# Patient Record
Sex: Male | Born: 1940 | Race: White | Hispanic: No | Smoking: Former smoker
Health system: Southern US, Community
[De-identification: ages and names within clinical notes are randomized; demographics above are authoritative.]

## PROBLEM LIST (undated history)

## (undated) DIAGNOSIS — D649 Anemia, unspecified: Secondary | ICD-10-CM

## (undated) DIAGNOSIS — K2970 Gastritis, unspecified, without bleeding: Principal | ICD-10-CM

## (undated) DIAGNOSIS — Z880 Allergy status to penicillin: Secondary | ICD-10-CM

## (undated) DIAGNOSIS — J189 Pneumonia, unspecified organism: Secondary | ICD-10-CM

## (undated) DIAGNOSIS — I2510 Atherosclerotic heart disease of native coronary artery without angina pectoris: Secondary | ICD-10-CM

## (undated) DIAGNOSIS — F419 Anxiety disorder, unspecified: Secondary | ICD-10-CM

## (undated) DIAGNOSIS — B9681 Helicobacter pylori [H. pylori] as the cause of diseases classified elsewhere: Secondary | ICD-10-CM

## (undated) DIAGNOSIS — C4490 Unspecified malignant neoplasm of skin, unspecified: Secondary | ICD-10-CM

## (undated) DIAGNOSIS — I499 Cardiac arrhythmia, unspecified: Secondary | ICD-10-CM

## (undated) DIAGNOSIS — J449 Chronic obstructive pulmonary disease, unspecified: Secondary | ICD-10-CM

## (undated) HISTORY — DX: Helicobacter pylori (H. pylori) as the cause of diseases classified elsewhere: B96.81

## (undated) HISTORY — PX: CHOLECYSTECTOMY: SHX55

## (undated) HISTORY — DX: Anemia, unspecified: D64.9

## (undated) HISTORY — DX: Cardiac arrhythmia, unspecified: I49.9

## (undated) HISTORY — PX: OTHER SURGICAL HISTORY: SHX169

## (undated) HISTORY — DX: Anxiety disorder, unspecified: F41.9

## (undated) HISTORY — DX: Allergy status to penicillin: Z88.0

## (undated) HISTORY — DX: Atherosclerotic heart disease of native coronary artery without angina pectoris: I25.10

## (undated) HISTORY — DX: Chronic obstructive pulmonary disease, unspecified: J44.9

## (undated) HISTORY — DX: Gastritis, unspecified, without bleeding: K29.70

## (undated) HISTORY — PX: APPENDECTOMY: SHX54

## (undated) HISTORY — DX: Unspecified malignant neoplasm of skin, unspecified: C44.90

## (undated) HISTORY — DX: Pneumonia, unspecified organism: J18.9

---

## 1973-10-26 HISTORY — PX: VARICOSE VEIN SURGERY: SHX832

## 1982-10-26 HISTORY — PX: SPINE SURGERY: SHX786

## 2004-08-13 ENCOUNTER — Ambulatory Visit: Payer: Self-pay | Admitting: Internal Medicine

## 2008-03-16 ENCOUNTER — Emergency Department (HOSPITAL_COMMUNITY): Admission: EM | Admit: 2008-03-16 | Discharge: 2008-03-16 | Payer: Self-pay | Admitting: Emergency Medicine

## 2008-05-23 ENCOUNTER — Ambulatory Visit: Payer: Self-pay | Admitting: Unknown Physician Specialty

## 2008-08-15 ENCOUNTER — Ambulatory Visit: Payer: Self-pay | Admitting: Specialist

## 2009-09-17 ENCOUNTER — Ambulatory Visit: Payer: Self-pay | Admitting: Internal Medicine

## 2010-04-06 ENCOUNTER — Emergency Department: Payer: Self-pay | Admitting: Emergency Medicine

## 2011-07-14 ENCOUNTER — Ambulatory Visit: Payer: Self-pay | Admitting: Gastroenterology

## 2011-07-16 LAB — PATHOLOGY REPORT

## 2011-07-20 ENCOUNTER — Emergency Department: Payer: Self-pay | Admitting: Emergency Medicine

## 2011-07-22 LAB — CBC
HCT: 48.3
Hemoglobin: 16.7
MCHC: 34.6
MCV: 90.4
Platelets: 465 — ABNORMAL HIGH
RBC: 5.34
RDW: 16 — ABNORMAL HIGH
WBC: 21.4 — ABNORMAL HIGH

## 2011-07-22 LAB — POCT CARDIAC MARKERS
CKMB, poc: 1.2
CKMB, poc: 1.6
Myoglobin, poc: 72.5
Myoglobin, poc: 95.6
Operator id: 282201
Operator id: 282201
Troponin i, poc: <0.05
Troponin i, poc: <0.05

## 2011-07-22 LAB — DIFFERENTIAL
Basophils Absolute: 0.2 — ABNORMAL HIGH
Basophils Relative: 1
Eosinophils Absolute: 0.3
Eosinophils Relative: 2
Lymphocytes Relative: 14
Lymphs Abs: 3
Monocytes Absolute: 1
Monocytes Relative: 5
Neutro Abs: 16.9 — ABNORMAL HIGH
Neutrophils Relative %: 79 — ABNORMAL HIGH

## 2011-07-22 LAB — POCT I-STAT, CHEM 8
BUN: 14
Calcium, Ion: 1.13
Chloride: 103
Creatinine, Ser: 0.9
Glucose, Bld: 94
HCT: 51
Hemoglobin: 17.3 — ABNORMAL HIGH
Potassium: 3.9
Sodium: 139
TCO2: 29

## 2011-07-22 LAB — D-DIMER, QUANTITATIVE (NOT AT ARMC): D-Dimer, Quant: 1.45 — ABNORMAL HIGH

## 2012-10-26 HISTORY — PX: COLONOSCOPY: SHX174

## 2014-02-15 ENCOUNTER — Ambulatory Visit: Payer: Self-pay | Admitting: Internal Medicine

## 2014-02-28 DIAGNOSIS — R911 Solitary pulmonary nodule: Secondary | ICD-10-CM

## 2014-02-28 DIAGNOSIS — K2100 Gastro-esophageal reflux disease with esophagitis, without bleeding: Secondary | ICD-10-CM | POA: Insufficient documentation

## 2014-02-28 HISTORY — DX: Gastro-esophageal reflux disease with esophagitis, without bleeding: K21.00

## 2014-02-28 HISTORY — DX: Solitary pulmonary nodule: R91.1

## 2014-11-08 DIAGNOSIS — R0602 Shortness of breath: Secondary | ICD-10-CM | POA: Diagnosis not present

## 2014-11-12 DIAGNOSIS — E782 Mixed hyperlipidemia: Secondary | ICD-10-CM | POA: Diagnosis not present

## 2014-11-12 DIAGNOSIS — I209 Angina pectoris, unspecified: Secondary | ICD-10-CM | POA: Diagnosis not present

## 2014-11-12 DIAGNOSIS — G4733 Obstructive sleep apnea (adult) (pediatric): Secondary | ICD-10-CM | POA: Diagnosis not present

## 2014-11-12 DIAGNOSIS — J431 Panlobular emphysema: Secondary | ICD-10-CM | POA: Diagnosis not present

## 2014-12-13 DIAGNOSIS — I208 Other forms of angina pectoris: Secondary | ICD-10-CM | POA: Diagnosis not present

## 2014-12-13 DIAGNOSIS — I2510 Atherosclerotic heart disease of native coronary artery without angina pectoris: Secondary | ICD-10-CM | POA: Diagnosis not present

## 2014-12-13 DIAGNOSIS — R072 Precordial pain: Secondary | ICD-10-CM | POA: Diagnosis not present

## 2014-12-18 DIAGNOSIS — Z79899 Other long term (current) drug therapy: Secondary | ICD-10-CM | POA: Diagnosis not present

## 2014-12-18 DIAGNOSIS — J449 Chronic obstructive pulmonary disease, unspecified: Secondary | ICD-10-CM | POA: Diagnosis not present

## 2014-12-18 DIAGNOSIS — R0789 Other chest pain: Secondary | ICD-10-CM | POA: Diagnosis not present

## 2014-12-18 DIAGNOSIS — E780 Pure hypercholesterolemia: Secondary | ICD-10-CM | POA: Diagnosis not present

## 2014-12-18 DIAGNOSIS — R9439 Abnormal result of other cardiovascular function study: Secondary | ICD-10-CM | POA: Diagnosis not present

## 2014-12-18 DIAGNOSIS — E785 Hyperlipidemia, unspecified: Secondary | ICD-10-CM | POA: Diagnosis not present

## 2014-12-18 DIAGNOSIS — I25119 Atherosclerotic heart disease of native coronary artery with unspecified angina pectoris: Secondary | ICD-10-CM | POA: Diagnosis not present

## 2014-12-18 DIAGNOSIS — J984 Other disorders of lung: Secondary | ICD-10-CM | POA: Diagnosis not present

## 2014-12-18 DIAGNOSIS — I252 Old myocardial infarction: Secondary | ICD-10-CM | POA: Diagnosis not present

## 2014-12-18 DIAGNOSIS — I471 Supraventricular tachycardia: Secondary | ICD-10-CM | POA: Diagnosis not present

## 2014-12-19 DIAGNOSIS — I471 Supraventricular tachycardia: Secondary | ICD-10-CM | POA: Diagnosis not present

## 2014-12-19 DIAGNOSIS — R0789 Other chest pain: Secondary | ICD-10-CM | POA: Diagnosis not present

## 2014-12-19 DIAGNOSIS — I2510 Atherosclerotic heart disease of native coronary artery without angina pectoris: Secondary | ICD-10-CM | POA: Diagnosis not present

## 2014-12-19 DIAGNOSIS — I25119 Atherosclerotic heart disease of native coronary artery with unspecified angina pectoris: Secondary | ICD-10-CM | POA: Diagnosis not present

## 2014-12-19 DIAGNOSIS — E780 Pure hypercholesterolemia: Secondary | ICD-10-CM | POA: Diagnosis not present

## 2014-12-19 DIAGNOSIS — Z79899 Other long term (current) drug therapy: Secondary | ICD-10-CM | POA: Diagnosis not present

## 2014-12-19 DIAGNOSIS — R9439 Abnormal result of other cardiovascular function study: Secondary | ICD-10-CM | POA: Diagnosis not present

## 2014-12-19 DIAGNOSIS — I252 Old myocardial infarction: Secondary | ICD-10-CM | POA: Diagnosis not present

## 2014-12-19 DIAGNOSIS — J449 Chronic obstructive pulmonary disease, unspecified: Secondary | ICD-10-CM | POA: Diagnosis not present

## 2014-12-19 DIAGNOSIS — E785 Hyperlipidemia, unspecified: Secondary | ICD-10-CM | POA: Diagnosis not present

## 2015-01-10 DIAGNOSIS — Z955 Presence of coronary angioplasty implant and graft: Secondary | ICD-10-CM | POA: Diagnosis not present

## 2015-01-10 DIAGNOSIS — E785 Hyperlipidemia, unspecified: Secondary | ICD-10-CM | POA: Diagnosis not present

## 2015-01-10 DIAGNOSIS — I2510 Atherosclerotic heart disease of native coronary artery without angina pectoris: Secondary | ICD-10-CM | POA: Diagnosis not present

## 2015-01-28 DIAGNOSIS — H269 Unspecified cataract: Secondary | ICD-10-CM | POA: Diagnosis not present

## 2015-01-28 DIAGNOSIS — Z961 Presence of intraocular lens: Secondary | ICD-10-CM | POA: Diagnosis not present

## 2015-01-28 DIAGNOSIS — D3131 Benign neoplasm of right choroid: Secondary | ICD-10-CM | POA: Diagnosis not present

## 2015-01-28 DIAGNOSIS — H3531 Nonexudative age-related macular degeneration: Secondary | ICD-10-CM | POA: Diagnosis not present

## 2015-04-23 DIAGNOSIS — D72823 Leukemoid reaction: Secondary | ICD-10-CM | POA: Diagnosis not present

## 2015-04-23 DIAGNOSIS — E785 Hyperlipidemia, unspecified: Secondary | ICD-10-CM | POA: Diagnosis not present

## 2015-04-23 DIAGNOSIS — D519 Vitamin B12 deficiency anemia, unspecified: Secondary | ICD-10-CM | POA: Diagnosis not present

## 2015-04-23 DIAGNOSIS — I209 Angina pectoris, unspecified: Secondary | ICD-10-CM | POA: Diagnosis not present

## 2015-04-23 DIAGNOSIS — Z125 Encounter for screening for malignant neoplasm of prostate: Secondary | ICD-10-CM | POA: Diagnosis not present

## 2015-05-01 DIAGNOSIS — J431 Panlobular emphysema: Secondary | ICD-10-CM | POA: Diagnosis not present

## 2015-05-01 DIAGNOSIS — R319 Hematuria, unspecified: Secondary | ICD-10-CM | POA: Diagnosis not present

## 2015-05-01 DIAGNOSIS — Z0000 Encounter for general adult medical examination without abnormal findings: Secondary | ICD-10-CM | POA: Diagnosis not present

## 2015-05-01 DIAGNOSIS — E538 Deficiency of other specified B group vitamins: Secondary | ICD-10-CM | POA: Diagnosis not present

## 2015-05-08 DIAGNOSIS — R310 Gross hematuria: Secondary | ICD-10-CM | POA: Diagnosis not present

## 2015-05-08 DIAGNOSIS — R351 Nocturia: Secondary | ICD-10-CM | POA: Diagnosis not present

## 2015-06-13 DIAGNOSIS — S60561A Insect bite (nonvenomous) of right hand, initial encounter: Secondary | ICD-10-CM | POA: Diagnosis not present

## 2015-06-13 DIAGNOSIS — J449 Chronic obstructive pulmonary disease, unspecified: Secondary | ICD-10-CM | POA: Diagnosis not present

## 2015-06-13 DIAGNOSIS — E785 Hyperlipidemia, unspecified: Secondary | ICD-10-CM | POA: Diagnosis not present

## 2015-06-13 DIAGNOSIS — S61459A Open bite of unspecified hand, initial encounter: Secondary | ICD-10-CM | POA: Diagnosis not present

## 2015-06-13 DIAGNOSIS — I252 Old myocardial infarction: Secondary | ICD-10-CM | POA: Diagnosis not present

## 2015-06-13 DIAGNOSIS — Z23 Encounter for immunization: Secondary | ICD-10-CM | POA: Diagnosis not present

## 2015-06-13 DIAGNOSIS — Z79899 Other long term (current) drug therapy: Secondary | ICD-10-CM | POA: Diagnosis not present

## 2015-06-13 DIAGNOSIS — Z87891 Personal history of nicotine dependence: Secondary | ICD-10-CM | POA: Diagnosis not present

## 2015-06-13 DIAGNOSIS — Z7982 Long term (current) use of aspirin: Secondary | ICD-10-CM | POA: Diagnosis not present

## 2015-06-13 DIAGNOSIS — I10 Essential (primary) hypertension: Secondary | ICD-10-CM | POA: Diagnosis not present

## 2015-06-14 DIAGNOSIS — Z87891 Personal history of nicotine dependence: Secondary | ICD-10-CM | POA: Diagnosis not present

## 2015-06-14 DIAGNOSIS — Z7982 Long term (current) use of aspirin: Secondary | ICD-10-CM | POA: Diagnosis not present

## 2015-06-14 DIAGNOSIS — I10 Essential (primary) hypertension: Secondary | ICD-10-CM | POA: Diagnosis not present

## 2015-06-14 DIAGNOSIS — Z23 Encounter for immunization: Secondary | ICD-10-CM | POA: Diagnosis not present

## 2015-06-14 DIAGNOSIS — Z79899 Other long term (current) drug therapy: Secondary | ICD-10-CM | POA: Diagnosis not present

## 2015-06-14 DIAGNOSIS — J449 Chronic obstructive pulmonary disease, unspecified: Secondary | ICD-10-CM | POA: Diagnosis not present

## 2015-06-14 DIAGNOSIS — E785 Hyperlipidemia, unspecified: Secondary | ICD-10-CM | POA: Diagnosis not present

## 2015-06-14 DIAGNOSIS — I252 Old myocardial infarction: Secondary | ICD-10-CM | POA: Diagnosis not present

## 2015-06-14 DIAGNOSIS — S61459A Open bite of unspecified hand, initial encounter: Secondary | ICD-10-CM | POA: Diagnosis not present

## 2015-07-08 DIAGNOSIS — D1801 Hemangioma of skin and subcutaneous tissue: Secondary | ICD-10-CM | POA: Diagnosis not present

## 2015-07-08 DIAGNOSIS — L821 Other seborrheic keratosis: Secondary | ICD-10-CM | POA: Diagnosis not present

## 2015-07-08 DIAGNOSIS — B078 Other viral warts: Secondary | ICD-10-CM | POA: Diagnosis not present

## 2015-07-08 DIAGNOSIS — D2371 Other benign neoplasm of skin of right lower limb, including hip: Secondary | ICD-10-CM | POA: Diagnosis not present

## 2015-07-08 DIAGNOSIS — L570 Actinic keratosis: Secondary | ICD-10-CM | POA: Diagnosis not present

## 2015-07-08 DIAGNOSIS — Z85820 Personal history of malignant melanoma of skin: Secondary | ICD-10-CM | POA: Diagnosis not present

## 2015-10-08 DIAGNOSIS — D72823 Leukemoid reaction: Secondary | ICD-10-CM | POA: Diagnosis not present

## 2015-10-08 DIAGNOSIS — M25511 Pain in right shoulder: Secondary | ICD-10-CM | POA: Diagnosis not present

## 2015-10-08 DIAGNOSIS — E538 Deficiency of other specified B group vitamins: Secondary | ICD-10-CM | POA: Diagnosis not present

## 2015-10-18 ENCOUNTER — Other Ambulatory Visit: Payer: Self-pay | Admitting: Physician Assistant

## 2015-10-18 DIAGNOSIS — J431 Panlobular emphysema: Secondary | ICD-10-CM | POA: Diagnosis not present

## 2015-10-18 DIAGNOSIS — J208 Acute bronchitis due to other specified organisms: Secondary | ICD-10-CM | POA: Diagnosis not present

## 2015-10-18 DIAGNOSIS — M25511 Pain in right shoulder: Secondary | ICD-10-CM | POA: Diagnosis not present

## 2015-11-04 DIAGNOSIS — M25511 Pain in right shoulder: Secondary | ICD-10-CM | POA: Diagnosis not present

## 2015-11-05 DIAGNOSIS — H6692 Otitis media, unspecified, left ear: Secondary | ICD-10-CM | POA: Diagnosis not present

## 2015-11-05 DIAGNOSIS — J449 Chronic obstructive pulmonary disease, unspecified: Secondary | ICD-10-CM | POA: Diagnosis not present

## 2015-11-05 DIAGNOSIS — J209 Acute bronchitis, unspecified: Secondary | ICD-10-CM | POA: Diagnosis not present

## 2015-11-05 DIAGNOSIS — J029 Acute pharyngitis, unspecified: Secondary | ICD-10-CM | POA: Diagnosis not present

## 2015-11-12 ENCOUNTER — Ambulatory Visit: Payer: Self-pay

## 2015-11-12 DIAGNOSIS — M25511 Pain in right shoulder: Secondary | ICD-10-CM | POA: Diagnosis not present

## 2015-11-13 DIAGNOSIS — M25511 Pain in right shoulder: Secondary | ICD-10-CM | POA: Diagnosis not present

## 2015-11-18 DIAGNOSIS — M6281 Muscle weakness (generalized): Secondary | ICD-10-CM | POA: Diagnosis not present

## 2015-11-18 DIAGNOSIS — M25611 Stiffness of right shoulder, not elsewhere classified: Secondary | ICD-10-CM | POA: Diagnosis not present

## 2015-11-18 DIAGNOSIS — M25511 Pain in right shoulder: Secondary | ICD-10-CM | POA: Diagnosis not present

## 2015-11-18 DIAGNOSIS — M7501 Adhesive capsulitis of right shoulder: Secondary | ICD-10-CM | POA: Diagnosis not present

## 2015-11-20 DIAGNOSIS — M25611 Stiffness of right shoulder, not elsewhere classified: Secondary | ICD-10-CM | POA: Diagnosis not present

## 2015-11-20 DIAGNOSIS — M6281 Muscle weakness (generalized): Secondary | ICD-10-CM | POA: Diagnosis not present

## 2015-11-20 DIAGNOSIS — M25511 Pain in right shoulder: Secondary | ICD-10-CM | POA: Diagnosis not present

## 2015-11-20 DIAGNOSIS — M7501 Adhesive capsulitis of right shoulder: Secondary | ICD-10-CM | POA: Diagnosis not present

## 2015-11-25 DIAGNOSIS — M6281 Muscle weakness (generalized): Secondary | ICD-10-CM | POA: Diagnosis not present

## 2015-11-25 DIAGNOSIS — M7501 Adhesive capsulitis of right shoulder: Secondary | ICD-10-CM | POA: Diagnosis not present

## 2015-11-25 DIAGNOSIS — M25611 Stiffness of right shoulder, not elsewhere classified: Secondary | ICD-10-CM | POA: Diagnosis not present

## 2015-11-25 DIAGNOSIS — M25511 Pain in right shoulder: Secondary | ICD-10-CM | POA: Diagnosis not present

## 2015-11-28 DIAGNOSIS — M25511 Pain in right shoulder: Secondary | ICD-10-CM | POA: Diagnosis not present

## 2015-11-28 DIAGNOSIS — M7501 Adhesive capsulitis of right shoulder: Secondary | ICD-10-CM | POA: Diagnosis not present

## 2015-11-28 DIAGNOSIS — M6281 Muscle weakness (generalized): Secondary | ICD-10-CM | POA: Diagnosis not present

## 2015-11-28 DIAGNOSIS — M25611 Stiffness of right shoulder, not elsewhere classified: Secondary | ICD-10-CM | POA: Diagnosis not present

## 2015-12-06 DIAGNOSIS — M25511 Pain in right shoulder: Secondary | ICD-10-CM | POA: Diagnosis not present

## 2015-12-06 DIAGNOSIS — M6281 Muscle weakness (generalized): Secondary | ICD-10-CM | POA: Diagnosis not present

## 2015-12-06 DIAGNOSIS — M7501 Adhesive capsulitis of right shoulder: Secondary | ICD-10-CM | POA: Diagnosis not present

## 2015-12-06 DIAGNOSIS — M25611 Stiffness of right shoulder, not elsewhere classified: Secondary | ICD-10-CM | POA: Diagnosis not present

## 2015-12-11 DIAGNOSIS — M25611 Stiffness of right shoulder, not elsewhere classified: Secondary | ICD-10-CM | POA: Diagnosis not present

## 2015-12-11 DIAGNOSIS — M6281 Muscle weakness (generalized): Secondary | ICD-10-CM | POA: Diagnosis not present

## 2015-12-11 DIAGNOSIS — M25511 Pain in right shoulder: Secondary | ICD-10-CM | POA: Diagnosis not present

## 2015-12-11 DIAGNOSIS — M7501 Adhesive capsulitis of right shoulder: Secondary | ICD-10-CM | POA: Diagnosis not present

## 2015-12-13 DIAGNOSIS — M25611 Stiffness of right shoulder, not elsewhere classified: Secondary | ICD-10-CM | POA: Diagnosis not present

## 2015-12-13 DIAGNOSIS — M25511 Pain in right shoulder: Secondary | ICD-10-CM | POA: Diagnosis not present

## 2015-12-13 DIAGNOSIS — M7501 Adhesive capsulitis of right shoulder: Secondary | ICD-10-CM | POA: Diagnosis not present

## 2015-12-13 DIAGNOSIS — M6281 Muscle weakness (generalized): Secondary | ICD-10-CM | POA: Diagnosis not present

## 2015-12-16 DIAGNOSIS — M6281 Muscle weakness (generalized): Secondary | ICD-10-CM | POA: Diagnosis not present

## 2015-12-16 DIAGNOSIS — M25611 Stiffness of right shoulder, not elsewhere classified: Secondary | ICD-10-CM | POA: Diagnosis not present

## 2015-12-16 DIAGNOSIS — M7501 Adhesive capsulitis of right shoulder: Secondary | ICD-10-CM | POA: Diagnosis not present

## 2015-12-16 DIAGNOSIS — M25511 Pain in right shoulder: Secondary | ICD-10-CM | POA: Diagnosis not present

## 2015-12-17 DIAGNOSIS — M25511 Pain in right shoulder: Secondary | ICD-10-CM | POA: Diagnosis not present

## 2015-12-18 DIAGNOSIS — M6281 Muscle weakness (generalized): Secondary | ICD-10-CM | POA: Diagnosis not present

## 2015-12-18 DIAGNOSIS — M25611 Stiffness of right shoulder, not elsewhere classified: Secondary | ICD-10-CM | POA: Diagnosis not present

## 2015-12-18 DIAGNOSIS — M7501 Adhesive capsulitis of right shoulder: Secondary | ICD-10-CM | POA: Diagnosis not present

## 2015-12-18 DIAGNOSIS — M25511 Pain in right shoulder: Secondary | ICD-10-CM | POA: Diagnosis not present

## 2015-12-23 DIAGNOSIS — M25511 Pain in right shoulder: Secondary | ICD-10-CM | POA: Diagnosis not present

## 2015-12-23 DIAGNOSIS — M25611 Stiffness of right shoulder, not elsewhere classified: Secondary | ICD-10-CM | POA: Diagnosis not present

## 2015-12-23 DIAGNOSIS — M6281 Muscle weakness (generalized): Secondary | ICD-10-CM | POA: Diagnosis not present

## 2015-12-23 DIAGNOSIS — M7501 Adhesive capsulitis of right shoulder: Secondary | ICD-10-CM | POA: Diagnosis not present

## 2015-12-26 DIAGNOSIS — M25611 Stiffness of right shoulder, not elsewhere classified: Secondary | ICD-10-CM | POA: Diagnosis not present

## 2015-12-26 DIAGNOSIS — M6281 Muscle weakness (generalized): Secondary | ICD-10-CM | POA: Diagnosis not present

## 2015-12-26 DIAGNOSIS — M7501 Adhesive capsulitis of right shoulder: Secondary | ICD-10-CM | POA: Diagnosis not present

## 2015-12-26 DIAGNOSIS — M25511 Pain in right shoulder: Secondary | ICD-10-CM | POA: Diagnosis not present

## 2015-12-30 DIAGNOSIS — M6281 Muscle weakness (generalized): Secondary | ICD-10-CM | POA: Diagnosis not present

## 2015-12-30 DIAGNOSIS — M25611 Stiffness of right shoulder, not elsewhere classified: Secondary | ICD-10-CM | POA: Diagnosis not present

## 2015-12-30 DIAGNOSIS — M25511 Pain in right shoulder: Secondary | ICD-10-CM | POA: Diagnosis not present

## 2015-12-30 DIAGNOSIS — M7501 Adhesive capsulitis of right shoulder: Secondary | ICD-10-CM | POA: Diagnosis not present

## 2016-01-01 DIAGNOSIS — M25611 Stiffness of right shoulder, not elsewhere classified: Secondary | ICD-10-CM | POA: Diagnosis not present

## 2016-01-01 DIAGNOSIS — M6281 Muscle weakness (generalized): Secondary | ICD-10-CM | POA: Diagnosis not present

## 2016-01-01 DIAGNOSIS — M25511 Pain in right shoulder: Secondary | ICD-10-CM | POA: Diagnosis not present

## 2016-01-01 DIAGNOSIS — M7501 Adhesive capsulitis of right shoulder: Secondary | ICD-10-CM | POA: Diagnosis not present

## 2016-01-06 DIAGNOSIS — M25611 Stiffness of right shoulder, not elsewhere classified: Secondary | ICD-10-CM | POA: Diagnosis not present

## 2016-01-06 DIAGNOSIS — M25511 Pain in right shoulder: Secondary | ICD-10-CM | POA: Diagnosis not present

## 2016-01-06 DIAGNOSIS — M6281 Muscle weakness (generalized): Secondary | ICD-10-CM | POA: Diagnosis not present

## 2016-01-06 DIAGNOSIS — M7501 Adhesive capsulitis of right shoulder: Secondary | ICD-10-CM | POA: Diagnosis not present

## 2016-01-08 DIAGNOSIS — M25611 Stiffness of right shoulder, not elsewhere classified: Secondary | ICD-10-CM | POA: Diagnosis not present

## 2016-01-08 DIAGNOSIS — M25511 Pain in right shoulder: Secondary | ICD-10-CM | POA: Diagnosis not present

## 2016-01-08 DIAGNOSIS — M7501 Adhesive capsulitis of right shoulder: Secondary | ICD-10-CM | POA: Diagnosis not present

## 2016-01-08 DIAGNOSIS — M6281 Muscle weakness (generalized): Secondary | ICD-10-CM | POA: Diagnosis not present

## 2016-01-13 DIAGNOSIS — M7501 Adhesive capsulitis of right shoulder: Secondary | ICD-10-CM | POA: Diagnosis not present

## 2016-01-13 DIAGNOSIS — M25511 Pain in right shoulder: Secondary | ICD-10-CM | POA: Diagnosis not present

## 2016-01-13 DIAGNOSIS — M25611 Stiffness of right shoulder, not elsewhere classified: Secondary | ICD-10-CM | POA: Diagnosis not present

## 2016-01-13 DIAGNOSIS — M6281 Muscle weakness (generalized): Secondary | ICD-10-CM | POA: Diagnosis not present

## 2016-01-16 DIAGNOSIS — M6281 Muscle weakness (generalized): Secondary | ICD-10-CM | POA: Diagnosis not present

## 2016-01-16 DIAGNOSIS — M25611 Stiffness of right shoulder, not elsewhere classified: Secondary | ICD-10-CM | POA: Diagnosis not present

## 2016-01-16 DIAGNOSIS — M7501 Adhesive capsulitis of right shoulder: Secondary | ICD-10-CM | POA: Diagnosis not present

## 2016-01-16 DIAGNOSIS — M25511 Pain in right shoulder: Secondary | ICD-10-CM | POA: Diagnosis not present

## 2016-01-20 DIAGNOSIS — M25611 Stiffness of right shoulder, not elsewhere classified: Secondary | ICD-10-CM | POA: Diagnosis not present

## 2016-01-20 DIAGNOSIS — M6281 Muscle weakness (generalized): Secondary | ICD-10-CM | POA: Diagnosis not present

## 2016-01-20 DIAGNOSIS — M25511 Pain in right shoulder: Secondary | ICD-10-CM | POA: Diagnosis not present

## 2016-01-20 DIAGNOSIS — M7501 Adhesive capsulitis of right shoulder: Secondary | ICD-10-CM | POA: Diagnosis not present

## 2016-01-22 DIAGNOSIS — L03115 Cellulitis of right lower limb: Secondary | ICD-10-CM | POA: Diagnosis not present

## 2016-01-23 DIAGNOSIS — M6281 Muscle weakness (generalized): Secondary | ICD-10-CM | POA: Diagnosis not present

## 2016-01-23 DIAGNOSIS — M25511 Pain in right shoulder: Secondary | ICD-10-CM | POA: Diagnosis not present

## 2016-01-23 DIAGNOSIS — M25611 Stiffness of right shoulder, not elsewhere classified: Secondary | ICD-10-CM | POA: Diagnosis not present

## 2016-01-23 DIAGNOSIS — M7501 Adhesive capsulitis of right shoulder: Secondary | ICD-10-CM | POA: Diagnosis not present

## 2016-01-29 DIAGNOSIS — M542 Cervicalgia: Secondary | ICD-10-CM | POA: Diagnosis not present

## 2016-01-29 DIAGNOSIS — M25511 Pain in right shoulder: Secondary | ICD-10-CM | POA: Diagnosis not present

## 2016-02-04 DIAGNOSIS — H43813 Vitreous degeneration, bilateral: Secondary | ICD-10-CM | POA: Diagnosis not present

## 2016-02-04 DIAGNOSIS — D3131 Benign neoplasm of right choroid: Secondary | ICD-10-CM | POA: Diagnosis not present

## 2016-02-04 DIAGNOSIS — S52122A Displaced fracture of head of left radius, initial encounter for closed fracture: Secondary | ICD-10-CM | POA: Diagnosis not present

## 2016-02-04 DIAGNOSIS — Z7982 Long term (current) use of aspirin: Secondary | ICD-10-CM | POA: Diagnosis not present

## 2016-02-04 DIAGNOSIS — I10 Essential (primary) hypertension: Secondary | ICD-10-CM | POA: Diagnosis not present

## 2016-02-04 DIAGNOSIS — M25522 Pain in left elbow: Secondary | ICD-10-CM | POA: Diagnosis not present

## 2016-02-04 DIAGNOSIS — H353120 Nonexudative age-related macular degeneration, left eye, stage unspecified: Secondary | ICD-10-CM | POA: Diagnosis not present

## 2016-02-04 DIAGNOSIS — I252 Old myocardial infarction: Secondary | ICD-10-CM | POA: Diagnosis not present

## 2016-02-04 DIAGNOSIS — M2540 Effusion, unspecified joint: Secondary | ICD-10-CM | POA: Diagnosis not present

## 2016-02-04 DIAGNOSIS — E7800 Pure hypercholesterolemia, unspecified: Secondary | ICD-10-CM | POA: Diagnosis not present

## 2016-02-04 DIAGNOSIS — Z885 Allergy status to narcotic agent status: Secondary | ICD-10-CM | POA: Diagnosis not present

## 2016-02-04 DIAGNOSIS — H2511 Age-related nuclear cataract, right eye: Secondary | ICD-10-CM | POA: Diagnosis not present

## 2016-02-04 DIAGNOSIS — S5002XA Contusion of left elbow, initial encounter: Secondary | ICD-10-CM | POA: Diagnosis not present

## 2016-02-04 DIAGNOSIS — J449 Chronic obstructive pulmonary disease, unspecified: Secondary | ICD-10-CM | POA: Diagnosis not present

## 2016-02-04 DIAGNOSIS — Z85828 Personal history of other malignant neoplasm of skin: Secondary | ICD-10-CM | POA: Diagnosis not present

## 2016-02-04 DIAGNOSIS — Z886 Allergy status to analgesic agent status: Secondary | ICD-10-CM | POA: Diagnosis not present

## 2016-02-04 DIAGNOSIS — W010XXA Fall on same level from slipping, tripping and stumbling without subsequent striking against object, initial encounter: Secondary | ICD-10-CM | POA: Diagnosis not present

## 2016-02-04 DIAGNOSIS — Z7951 Long term (current) use of inhaled steroids: Secondary | ICD-10-CM | POA: Diagnosis not present

## 2016-02-05 DIAGNOSIS — S52125A Nondisplaced fracture of head of left radius, initial encounter for closed fracture: Secondary | ICD-10-CM | POA: Diagnosis not present

## 2016-03-11 DIAGNOSIS — S52125A Nondisplaced fracture of head of left radius, initial encounter for closed fracture: Secondary | ICD-10-CM | POA: Diagnosis not present

## 2016-04-07 ENCOUNTER — Encounter: Payer: Self-pay | Admitting: Gastroenterology

## 2016-04-10 DIAGNOSIS — Z85828 Personal history of other malignant neoplasm of skin: Secondary | ICD-10-CM | POA: Diagnosis not present

## 2016-04-10 DIAGNOSIS — L570 Actinic keratosis: Secondary | ICD-10-CM | POA: Diagnosis not present

## 2016-04-10 DIAGNOSIS — L821 Other seborrheic keratosis: Secondary | ICD-10-CM | POA: Diagnosis not present

## 2016-04-10 DIAGNOSIS — L0889 Other specified local infections of the skin and subcutaneous tissue: Secondary | ICD-10-CM | POA: Diagnosis not present

## 2016-04-10 DIAGNOSIS — L538 Other specified erythematous conditions: Secondary | ICD-10-CM | POA: Diagnosis not present

## 2016-04-10 DIAGNOSIS — Z08 Encounter for follow-up examination after completed treatment for malignant neoplasm: Secondary | ICD-10-CM | POA: Diagnosis not present

## 2016-04-10 DIAGNOSIS — B078 Other viral warts: Secondary | ICD-10-CM | POA: Diagnosis not present

## 2016-04-10 DIAGNOSIS — X32XXXA Exposure to sunlight, initial encounter: Secondary | ICD-10-CM | POA: Diagnosis not present

## 2016-04-22 DIAGNOSIS — M542 Cervicalgia: Secondary | ICD-10-CM | POA: Diagnosis not present

## 2016-04-22 DIAGNOSIS — M25511 Pain in right shoulder: Secondary | ICD-10-CM | POA: Diagnosis not present

## 2016-05-04 DIAGNOSIS — Z125 Encounter for screening for malignant neoplasm of prostate: Secondary | ICD-10-CM | POA: Diagnosis not present

## 2016-05-04 DIAGNOSIS — E538 Deficiency of other specified B group vitamins: Secondary | ICD-10-CM | POA: Diagnosis not present

## 2016-05-04 DIAGNOSIS — Z0000 Encounter for general adult medical examination without abnormal findings: Secondary | ICD-10-CM | POA: Diagnosis not present

## 2016-05-11 DIAGNOSIS — E538 Deficiency of other specified B group vitamins: Secondary | ICD-10-CM | POA: Diagnosis not present

## 2016-05-11 DIAGNOSIS — Z0000 Encounter for general adult medical examination without abnormal findings: Secondary | ICD-10-CM | POA: Diagnosis not present

## 2016-05-11 DIAGNOSIS — D72823 Leukemoid reaction: Secondary | ICD-10-CM | POA: Diagnosis not present

## 2016-05-11 DIAGNOSIS — Z23 Encounter for immunization: Secondary | ICD-10-CM | POA: Diagnosis not present

## 2016-05-11 DIAGNOSIS — M659 Synovitis and tenosynovitis, unspecified: Secondary | ICD-10-CM | POA: Diagnosis not present

## 2016-05-18 DIAGNOSIS — M7581 Other shoulder lesions, right shoulder: Secondary | ICD-10-CM | POA: Diagnosis not present

## 2016-05-18 DIAGNOSIS — M75121 Complete rotator cuff tear or rupture of right shoulder, not specified as traumatic: Secondary | ICD-10-CM | POA: Diagnosis not present

## 2016-06-03 ENCOUNTER — Encounter: Payer: Self-pay | Admitting: Gastroenterology

## 2016-06-03 ENCOUNTER — Ambulatory Visit (INDEPENDENT_AMBULATORY_CARE_PROVIDER_SITE_OTHER): Payer: PPO | Admitting: Gastroenterology

## 2016-06-03 VITALS — BP 94/60 | HR 56 | Ht 66.5 in | Wt 181.8 lb

## 2016-06-03 DIAGNOSIS — K219 Gastro-esophageal reflux disease without esophagitis: Secondary | ICD-10-CM

## 2016-06-03 MED ORDER — OMEPRAZOLE 40 MG PO CPDR: 40.0000 mg | DELAYED_RELEASE_CAPSULE | Freq: Every day | ORAL | 6 refills | Status: DC

## 2016-06-03 NOTE — Patient Instructions (Addendum)
Please start once daily omeprazole (new script called in for 40mg  pill, disp 30, 6 refills).  Take one pill 20-30 min before your breakfast meal daily. Also start ranitidine 150mg  (OTC) one pill at bedtime nightly. You will be set up for an upper endoscopy.

## 2016-06-03 NOTE — Progress Notes (Signed)
HPI: This is a very post 75 year old man whom I am meeting for the first time today   Chief complaint is intermittent vomiting, dyspepsia.  Self referred:  Has been having 1 year of GI issues.  Has sensation of food not doing down.  Gets nauseas and will have to self induce vomiting.    This is usually when laying down at bedtime.  He has vomiting about once per week.    He tried a 'digestive pill' from [place].  Has been taking baking soda and it helps a bit.  He had his GB removed many years ago;    Overall he has gained weight in the past year.    Has tried antiacid and this helped at first but then stopped.  He is very vague about his symptoms.  He is not currently on any anti acid medicines.  Never had EGD.  He had colonoscopy, at [facility]; not sure what was found, who did it.  May have been a polyp.   Review of systems: Pertinent positive and negative review of systems were noted in the above HPI section. Complete review of systems was performed and was otherwise normal.   Past Medical History:  Diagnosis Date  . Anemia   . Anxiety   . Cardiac arrhythmia   . COPD (chronic obstructive pulmonary disease) ([place])   . Pneumonia   . Skin cancer     Past Surgical History:  Procedure Laterality Date  . APPENDECTOMY    . back fusion    . cataract surgery Left   . CHOLECYSTECTOMY      Current Outpatient Prescriptions  Medication Sig Dispense Refill  . acetaminophen (TYLENOL) 500 MG tablet Take 1,000 mg by mouth daily as needed.    Marland Kitchen albuterol (PROVENTIL HFA;VENTOLIN HFA) 108 (90 Base) MCG/ACT inhaler Inhale 2 puffs into the lungs every 6 (six) hours as needed for wheezing or shortness of breath.    Marland Kitchen aspirin 81 MG tablet Take 81 mg by mouth daily.    Marland Kitchen b complex vitamins tablet Take 1 tablet by mouth daily.    . bisoprolol (ZEBETA) 5 MG tablet Take 2.5 mg by mouth daily.    . Calcium Citrate-Vitamin D (CALCIUM CITRATE +D PO) Take 1 capsule by mouth  daily.    . Carboxymethylcellulose Sodium (LUBRICANT EYE DROPS OP) Apply 1 drop to eye as needed.    . cyanocobalamin 1000 MCG tablet Take 1,000 mcg by mouth daily.    Marland Kitchen esomeprazole (NEXIUM) 40 MG capsule Take 40 mg by mouth daily as needed.    Marland Kitchen GLUCOSAMINE CHONDROITIN COMPLX PO Take 1 tablet by mouth daily.    Marland Kitchen loratadine (CLARITIN) 10 MG tablet Take 10 mg by mouth daily.    . Magnesium Oxide 500 MG CAPS Take 2 capsules by mouth daily.    . Multiple Vitamins-Minerals (PRESERVISION AREDS 2 PO) Take 1 tablet by mouth 2 (two) times daily.    . multivitamin-lutein (OCUVITE-LUTEIN) CAPS capsule Take 1 capsule by mouth daily.    . nitroGLYCERIN (NITROSTAT) 0.4 MG SL tablet Place 0.4 mg under the tongue every 5 (five) minutes as needed for chest pain.    Marland Kitchen Potassium 99 MG TABS Take 1 tablet by mouth daily.    . vitamin E 400 UNIT capsule Take 400 Units by mouth daily.     No current facility-administered medications for this visit.     Allergies as of 06/03/2016 - Review Complete 06/03/2016  Allergen Reaction Noted  .  Aspirin  06/03/2016  . Penicillins  06/03/2016  . Percocet [oxycodone-acetaminophen]  06/03/2016  . Prednisone  06/03/2016    Family History  Problem Relation Age of Onset  . Colon cancer Mother   . Diabetes Father   . Heart disease Father   . Heart disease Brother   . Pancreatic cancer Cousin     x 2  . Prostate cancer Cousin   . Heart disease Son     Social History   Social History  . Marital status: Single    Spouse name: N/A  . Number of children: N/A  . Years of education: N/A   Occupational History  . Not on file.   Social History Main Topics  . Smoking status: Former Research scientist (life sciences)  . Smokeless tobacco: Never Used  . Alcohol use No  . Drug use: No  . Sexual activity: Not on file   Other Topics Concern  . Not on file   Social History Narrative  . No narrative on file     Physical Exam: BP 94/60   Pulse (!) 56   Ht 5' 6.5" (1.689 m)   Wt 181  lb 12.8 oz (82.5 kg)   BMI 28.90 kg/m  Constitutional: generally well-appearing Psychiatric: alert and oriented x3 Eyes: extraocular movements intact Mouth: oral pharynx moist, no lesions Neck: supple no lymphadenopathy Cardiovascular: heart regular rate and rhythm Lungs: clear to auscultation bilaterally Abdomen: soft, nontender, nondistended, no obvious ascites, no peritoneal signs, normal bowel sounds Extremities: no lower extremity edema bilaterally Skin: no lesions on visible extremities   Assessment and plan: 75 y.o. male with  chronic intermittent vomiting, dyspepsia  He was a poor historian but does seem like he is having vomiting once to twice a week. He swears that baking soda is only thing that has helped. Based on this I suspect that acid may be playing a role in his symptoms and I'm calling him in omeprazole 40 mg to take 1 pill shortly before breakfast every morning. He will also start ranitidine 150 mg at night, bedtime every night since a lot of his symptoms seem to be worse at night. Lasted we will proceed with EGD at his soonest convenience check for H. pylori infection, peptic ulcer disease.   Owens Loffler, MD [facility] 06/03/2016, 2:31 PM

## 2016-06-05 ENCOUNTER — Encounter: Payer: Self-pay | Admitting: Gastroenterology

## 2016-06-05 ENCOUNTER — Ambulatory Visit (AMBULATORY_SURGERY_CENTER): Payer: PPO | Admitting: Gastroenterology

## 2016-06-05 VITALS — BP 121/65 | HR 60 | Temp 97.5°F | Resp 12 | Ht 66.5 in | Wt 181.0 lb

## 2016-06-05 DIAGNOSIS — K219 Gastro-esophageal reflux disease without esophagitis: Secondary | ICD-10-CM | POA: Diagnosis not present

## 2016-06-05 DIAGNOSIS — B9681 Helicobacter pylori [H. pylori] as the cause of diseases classified elsewhere: Secondary | ICD-10-CM | POA: Diagnosis not present

## 2016-06-05 DIAGNOSIS — D649 Anemia, unspecified: Secondary | ICD-10-CM | POA: Diagnosis not present

## 2016-06-05 DIAGNOSIS — K2970 Gastritis, unspecified, without bleeding: Secondary | ICD-10-CM

## 2016-06-05 DIAGNOSIS — J449 Chronic obstructive pulmonary disease, unspecified: Secondary | ICD-10-CM | POA: Diagnosis not present

## 2016-06-05 DIAGNOSIS — K2950 Unspecified chronic gastritis without bleeding: Secondary | ICD-10-CM | POA: Diagnosis not present

## 2016-06-05 MED ORDER — SODIUM CHLORIDE 0.9 % IV SOLN: 500.0000 mL | INTRAVENOUS | Status: DC

## 2016-06-05 NOTE — Progress Notes (Signed)
To recovery, treport to [employer], Therapist, sports, VSS

## 2016-06-05 NOTE — Patient Instructions (Signed)
YOU HAD AN ENDOSCOPIC PROCEDURE TODAY AT [facility]:   Refer to the procedure report that was given to you for any specific questions about what was found during the examination.  If the procedure report does not answer your questions, please call your gastroenterologist to clarify.  If you requested that your care partner not be given the details of your procedure findings, then the procedure report has been included in a sealed envelope for you to review at your convenience later.  YOU SHOULD EXPECT: Some feelings of bloating in the abdomen. Passage of more gas than usual.  Walking can help get rid of the air that was put into your GI tract during the procedure and reduce the bloating. If you had a lower endoscopy (such as a colonoscopy or flexible sigmoidoscopy) you may notice spotting of blood in your stool or on the toilet paper. If you underwent a bowel prep for your procedure, you may not have a normal bowel movement for a few days.  Please Note:  You might notice some irritation and congestion in your nose or some drainage.  This is from the oxygen used during your procedure.  There is no need for concern and it should clear up in a day or so.  SYMPTOMS TO REPORT IMMEDIATELY:     Following upper endoscopy (EGD)  Vomiting of blood or coffee ground material  New chest pain or pain under the shoulder blades  Painful or persistently difficult swallowing  New shortness of breath  Fever of 100F or higher  Black, tarry-looking stools  For urgent or emergent issues, a gastroenterologist can be reached at any hour by calling [phone].   DIET: Your first meal following the procedure should be a small meal and then it is ok to progress to your normal diet. Heavy or fried foods are harder to digest and may make you feel nauseous or bloated.  Likewise, meals heavy in dairy and vegetables can increase bloating.  Drink plenty of fluids but you should avoid alcoholic beverages  for 24 hours.  ACTIVITY:  You should plan to take it easy for the rest of today and you should NOT DRIVE or use heavy machinery until tomorrow (because of the sedation medicines used during the test).    FOLLOW UP: Our staff will call the number listed on your records the next business day following your procedure to check on you and address any questions or concerns that you may have regarding the information given to you following your procedure. If we do not reach you, we will leave a message.  However, if you are feeling well and you are not experiencing any problems, there is no need to return our call.  We will assume that you have returned to your regular daily activities without incident.  If any biopsies were taken you will be contacted by phone or by letter within the next 1-3 weeks.  Please call us at [phone] if you have not heard about the biopsies in 3 weeks.    SIGNATURES/CONFIDENTIALITY: You and/or your care partner have signed paperwork which will be entered into your electronic medical record.  These signatures attest to the fact that that the information above on your After Visit Summary has been reviewed and is understood.  Full responsibility of the confidentiality of this discharge information lies with you and/or your care-partner.   Information on gastritis given to you today  Resume medications   Continue omeprazole and Ranitidine as  ordered on your procedure report  Await biopsy results

## 2016-06-05 NOTE — Op Note (Signed)
[place] Patient Name: Bill [name] Procedure Date: 06/05/2016 7:54 AM MRN: [id number] Endoscopist: Milus Banister , MD Age: 75 Referring MD:  Date of Birth: [birth year] Gender: Male Account #: [id number] Procedure:                Upper GI endoscopy Indications:              Dyspepsia, Heartburn Medicines:                Monitored Anesthesia Care Procedure:                Pre-Anesthesia Assessment:                           - Prior to the procedure, a History and Physical                            was performed, and patient medications and                            allergies were reviewed. The patient's tolerance of                            previous anesthesia was also reviewed. The risks                            and benefits of the procedure and the sedation                            options and risks were discussed with the patient.                            All questions were answered, and informed consent                            was obtained. Prior Anticoagulants: The patient has                            taken no previous anticoagulant or antiplatelet                            agents. ASA Grade Assessment: II - A patient with                            mild systemic disease. After reviewing the risks                            and benefits, the patient was deemed in                            satisfactory condition to undergo the procedure.                           After obtaining informed consent, the endoscope was  passed under direct vision. Throughout the                            procedure, the patient's blood pressure, pulse, and                            oxygen saturations were monitored continuously. The                            Model GIF-[id number]) scope was introduced                            through the mouth, and advanced to the second part                            of duodenum. The upper GI  endoscopy was                            accomplished without difficulty. The patient                            tolerated the procedure well. Scope In: Scope Out: Findings:                 LA Grade A (one or more mucosal breaks less than 5                            mm, not extending between tops of 2 mucosal folds)                            esophagitis with no bleeding was found at the                            gastroesophageal junction.                           Scattered moderate inflammation characterized by                            erythema, friability and granularity was found in                            the gastric antrum. Biopsies were taken with a cold                            forceps for histology.                           The exam was otherwise without abnormality. Complications:            No immediate complications. Estimated blood loss:                            None. Estimated Blood Loss:     Estimated blood loss: none. Impression:               -  LA Grade A reflux esophagitis.                           - Gastritis. Biopsied.                           - The examination was otherwise normal. Recommendation:           - Patient has a contact number available for                            emergencies. The signs and symptoms of potential                            delayed complications were discussed with the                            patient. Return to normal activities tomorrow.                            Written discharge instructions were provided to the                            patient.                           - Resume previous diet.                           - Continue present medications. (Omeprazole 40mg                             pill, one pill 20-30 min prior to breakfast meal                            daily. Ranitidine 150mg  pill, one pill at bedtime                            nightly).                           - Await pathology results. Milus Banister, MD 06/05/2016 8:10:00 AM This report has been signed electronically.

## 2016-06-05 NOTE — Progress Notes (Signed)
Called to room to assist during endoscopic procedure.  Patient ID and intended procedure confirmed with present staff. Received instructions for my participation in the procedure from the performing physician.  

## 2016-06-07 ENCOUNTER — Emergency Department (HOSPITAL_COMMUNITY): Payer: PPO

## 2016-06-07 ENCOUNTER — Encounter (HOSPITAL_COMMUNITY): Payer: Self-pay | Admitting: Emergency Medicine

## 2016-06-07 ENCOUNTER — Telehealth: Payer: Self-pay | Admitting: Internal Medicine

## 2016-06-07 ENCOUNTER — Emergency Department (HOSPITAL_COMMUNITY)
Admission: EM | Admit: 2016-06-07 | Discharge: 2016-06-07 | Disposition: A | Discharge status: Home / Self Care | Payer: PPO | Attending: Emergency Medicine | Admitting: Emergency Medicine

## 2016-06-07 DIAGNOSIS — J449 Chronic obstructive pulmonary disease, unspecified: Secondary | ICD-10-CM | POA: Diagnosis not present

## 2016-06-07 DIAGNOSIS — Z87891 Personal history of nicotine dependence: Secondary | ICD-10-CM | POA: Insufficient documentation

## 2016-06-07 DIAGNOSIS — R072 Precordial pain: Secondary | ICD-10-CM | POA: Diagnosis not present

## 2016-06-07 DIAGNOSIS — Z7982 Long term (current) use of aspirin: Secondary | ICD-10-CM | POA: Insufficient documentation

## 2016-06-07 DIAGNOSIS — Z79899 Other long term (current) drug therapy: Secondary | ICD-10-CM | POA: Diagnosis not present

## 2016-06-07 DIAGNOSIS — Z85828 Personal history of other malignant neoplasm of skin: Secondary | ICD-10-CM | POA: Diagnosis not present

## 2016-06-07 DIAGNOSIS — J439 Emphysema, unspecified: Secondary | ICD-10-CM | POA: Diagnosis not present

## 2016-06-07 LAB — CBC
HCT: 42.9 % (ref 39.0–52.0)
Hemoglobin: 14.1 g/dL (ref 13.0–17.0)
MCH: 31.5 pg (ref 26.0–34.0)
MCHC: 32.9 g/dL (ref 30.0–36.0)
MCV: 95.8 fL (ref 78.0–100.0)
Platelets: 354 10*3/uL (ref 150–400)
RBC: 4.48 MIL/uL (ref 4.22–5.81)
RDW: 16.8 % — ABNORMAL HIGH (ref 11.5–15.5)
WBC: 14 10*3/uL — ABNORMAL HIGH (ref 4.0–10.5)

## 2016-06-07 LAB — BASIC METABOLIC PANEL
Anion gap: 8 (ref 5–15)
BUN: 12 mg/dL (ref 6–20)
CO2: 20 mmol/L — ABNORMAL LOW (ref 22–32)
Calcium: 8.6 mg/dL — ABNORMAL LOW (ref 8.9–10.3)
Chloride: 108 mmol/L (ref 101–111)
Creatinine, Ser: 0.89 mg/dL (ref 0.61–1.24)
GFR calc Af Amer: >60 mL/min (ref >60)
GFR calc non Af Amer: >60 mL/min (ref >60)
Glucose, Bld: 118 mg/dL — ABNORMAL HIGH (ref 65–99)
Potassium: 3.8 mmol/L (ref 3.5–5.1)
Sodium: 136 mmol/L (ref 135–145)

## 2016-06-07 LAB — I-STAT TROPONIN, ED: Troponin i, poc: 0 ng/mL (ref 0.00–0.08)

## 2016-06-07 NOTE — Telephone Encounter (Signed)
EGD 06/05/16  Vomited after eatig after EGD 8/11 Heart feels "funny" Pains down left arm worsening No sig dyspnea Has NTG "pain not bad enough to take"  I advised he be seen in ED for evaluation and he said he will go

## 2016-06-07 NOTE — ED Triage Notes (Signed)
Pt sts some N/V after having endoscopy on last Friday and now having some tingling in left arm and CP

## 2016-06-07 NOTE — Discharge Instructions (Signed)
It was our pleasure to provide your ER care today - we hope that you feel better.  Continue your acid blocker medication.    You may take tylenol as need.  Your heart rate is low today (as low as 40's) - hold/stop taking your beta blocker medication (bisoprolol/zebeta).   Follow up with your doctor in the next couple days for recheck.  Return to ER if worse, new symptoms, fevers, trouble breathing, persistent/recurrent chest pain, other concern.

## 2016-06-07 NOTE — ED Provider Notes (Signed)
[facility] DEPT Provider Note   CSN: [id number] Arrival date & time: 06/07/16  1735  First Provider Contact:  None       History   Chief Complaint Chief Complaint  Patient presents with  . Chest Pain  . Nausea    HPI Bill [name] is a 75 y.o. male.  Patient c/o tingling/funny feeling in chest, that is also a soreness, that also feels towards left arm - states has felt this way ever since his endoscopy 2 days ago.  Constant. Mild. Not pleuritic. Patient states he feels something must have went wrong during his endoscopy that 'had an effect on my heart, or must have caused this...Marland KitchenMarland KitchenMarland Kitchenwhether my breathing got cut off, or something'.   No sob. No diaphoresis. No nv.  No cough or uri c/o. No fever or chills. ?hx prior mi - states they told him at [facility] that he had a heart attack, but denies prior stent or other intervention.  Patient then indicates they did a cardiac cath approximately 1 year ago and told him no blockages.  No exertional cp or discomfort.    The history is provided by the patient and a relative.  Chest Pain   Pertinent negatives include no abdominal pain, no back pain, no cough, no fever, no headaches, no nausea, no palpitations, no shortness of breath and no vomiting.    Past Medical History:  Diagnosis Date  . Anemia   . Anxiety   . Cardiac arrhythmia   . COPD (chronic obstructive pulmonary disease) ([place])   . Pneumonia   . Skin cancer     There are no active problems to display for this patient.   Past Surgical History:  Procedure Laterality Date  . APPENDECTOMY    . back fusion    . cataract surgery Left   . CHOLECYSTECTOMY         Home Medications    Prior to Admission medications   Medication Sig Start Date End Date Taking? Authorizing Provider  acetaminophen (TYLENOL) 500 MG tablet Take 1,000 mg by mouth daily as needed.    Historical Provider, MD  albuterol (PROVENTIL HFA;VENTOLIN HFA) 108 (90 Base) MCG/ACT inhaler  Inhale 2 puffs into the lungs every 6 (six) hours as needed for wheezing or shortness of breath.    Historical Provider, MD  aspirin 81 MG tablet Take 81 mg by mouth daily.    Historical Provider, MD  b complex vitamins tablet Take 1 tablet by mouth daily.    Historical Provider, MD  bisoprolol (ZEBETA) 5 MG tablet Take 2.5 mg by mouth daily.    Historical Provider, MD  Calcium Citrate-Vitamin D (CALCIUM CITRATE +D PO) Take 1 capsule by mouth daily.    Historical Provider, MD  Carboxymethylcellulose Sodium (LUBRICANT EYE DROPS OP) Apply 1 drop to eye as needed.    Historical Provider, MD  cyanocobalamin 1000 MCG tablet Take 1,000 mcg by mouth daily.    Historical Provider, MD  esomeprazole (NEXIUM) 40 MG capsule Take 40 mg by mouth daily as needed.    Historical Provider, MD  GLUCOSAMINE CHONDROITIN COMPLX PO Take 1 tablet by mouth daily.    Historical Provider, MD  loratadine (CLARITIN) 10 MG tablet Take 10 mg by mouth daily.    Historical Provider, MD  Magnesium Oxide 500 MG CAPS Take 2 capsules by mouth daily.    Historical Provider, MD  Multiple Vitamins-Minerals (PRESERVISION AREDS 2 PO) Take 1 tablet by mouth 2 (two) times daily.  Historical Provider, MD  multivitamin-lutein (OCUVITE-LUTEIN) CAPS capsule Take 1 capsule by mouth daily.    Historical Provider, MD  nitroGLYCERIN (NITROSTAT) 0.4 MG SL tablet Place 0.4 mg under the tongue every 5 (five) minutes as needed for chest pain.    Historical Provider, MD  omeprazole (PRILOSEC) 40 MG capsule Take 1 capsule (40 mg total) by mouth daily. 06/03/16   Milus Banister, MD  Potassium 99 MG TABS Take 1 tablet by mouth daily.    Historical Provider, MD  vitamin E 400 UNIT capsule Take 400 Units by mouth daily.    Historical Provider, MD    Family History Family History  Problem Relation Age of Onset  . Colon cancer Mother   . Diabetes Father   . Heart disease Father   . Heart disease Brother   . Pancreatic cancer Cousin     x 2  .  Prostate cancer Cousin   . Heart disease Son     Social History Social History  Substance Use Topics  . Smoking status: Former Research scientist (life sciences)  . Smokeless tobacco: Never Used  . Alcohol use No     Allergies   Aspirin; Penicillins; Percocet [oxycodone-acetaminophen]; and Prednisone   Review of Systems Review of Systems  Constitutional: Negative for fever.  HENT: Negative for sore throat.   Eyes: Negative for redness.  Respiratory: Negative for cough and shortness of breath.   Cardiovascular: Positive for chest pain. Negative for palpitations and leg swelling.  Gastrointestinal: Negative for abdominal pain, nausea and vomiting.  Genitourinary: Negative for flank pain.  Musculoskeletal: Negative for back pain and neck pain.  Skin: Negative for rash.  Neurological: Negative for headaches.  Hematological: Does not bruise/bleed easily.  Psychiatric/Behavioral: Negative for confusion.     Physical Exam Updated Vital Signs BP 135/74 (BP Location: Right Arm)   Pulse 60   Temp 98.1 F (36.7 C) (Oral)   Resp 14   SpO2 98%   Physical Exam  Constitutional: He is oriented to person, place, and time. He appears well-developed and well-nourished. No distress.  HENT:  Mouth/Throat: Oropharynx is clear and moist.  Eyes: Pupils are equal, round, and reactive to light.  Neck: Neck supple. No tracheal deviation present.  Cardiovascular: Normal rate, regular rhythm, normal heart sounds and intact distal pulses.  Exam reveals no gallop and no friction rub.   No murmur heard. Pulmonary/Chest: Effort normal and breath sounds normal. No accessory muscle usage. No respiratory distress. He exhibits tenderness.  Abdominal: Soft. Bowel sounds are normal. He exhibits no distension. There is no tenderness.  Musculoskeletal: He exhibits no edema or tenderness.  Neurological: He is alert and oriented to person, place, and time.  Skin: Skin is warm and dry.  Psychiatric: He has a normal mood and affect.   Nursing note and vitals reviewed.    ED Treatments / Results  Labs (all labs ordered are listed, but only abnormal results are displayed) Labs Reviewed  BASIC METABOLIC PANEL - Abnormal; Notable for the following:       Result Value   CO2 20 (*)    Glucose, Bld 118 (*)    Calcium 8.6 (*)    All other components within normal limits  CBC - Abnormal; Notable for the following:    WBC 14.0 (*)    RDW 16.8 (*)    All other components within normal limits  Randolm Idol, ED    EKG  EKG Interpretation  Date/Time:  Sunday June 07 2016 17:45:56 EDT Ventricular  Rate:  50 PR Interval:  146 QRS Duration: 88 QT Interval:  434 QTC Calculation: 395 R Axis:   82 Text Interpretation:  Sinus bradycardia with sinus arrhythmia Otherwise normal ECG Confirmed by Ashok Cordia  MD, Lennette Bihari ([id number]) on 06/07/2016 7:09:06 PM       Radiology Dg Chest 2 View  Result Date: 06/07/2016 CLINICAL DATA:  Left arm pain and tingling with left chest soreness. EXAM: CHEST  2 VIEW COMPARISON:  12/18/2014 FINDINGS: Mild atherosclerotic calcification of the aortic arch. Mild biapical pleural parenchymal scarring. Heart size within normal limits. No pleural effusion. Thoracic spondylosis. Minimal likely physiologic wedging at the thoracolumbar junction. Emphysema noted. IMPRESSION: 1. Emphysema with biapical pleural parenchymal scarring. No acute findings. 2. Atherosclerotic aortic arch. 3. Thoracic spondylosis. Electronically Signed   By: Van Clines M.D.   On: 06/07/2016 18:27    Procedures Procedures (including critical care time)  Medications Ordered in ED Medications - No data to display   Initial Impression / Assessment and Plan / ED Course  I have reviewed the triage vital signs and the nursing notes.  Pertinent labs & imaging results that were available during my care of the patient were reviewed by me and considered in my medical decision making (see chart for details).  Clinical Course     Iv ns. Labs.  Reviewed nursing notes and prior charts for additional history.   After symptoms for 2 days, trop normal/negative.   Patient reports negative cardiac cath last year - cannot fine report in epic, however care everywhere note from pcp references normal cardiac cath.  After 2 days symptoms, trop 0.  Mild chest wall tenderness on exam.  Patient's hr as low as 40's on monitor. No syncope or near syncope, and bp normal. Sinus. Is on b blocker. Will hold b blocker for now, and rec close pcp f/u.  Return precautions provided.   Final Clinical Impressions(s) / ED Diagnoses   Final diagnoses:  None    New Prescriptions New Prescriptions   No medications on file     Lajean Saver, MD 06/07/16 1954

## 2016-06-08 ENCOUNTER — Telehealth: Payer: Self-pay

## 2016-06-08 NOTE — Telephone Encounter (Signed)
  Follow up Call-  Call back number 06/05/2016  Post procedure Call Back phone  # [phone]  Permission to leave phone message Yes  Some recent data might be hidden    Patient was called for follow up after his procedure on 06/05/2016. No answer at the number given for follow up phone call. A message was left on the answering machine.

## 2016-06-09 DIAGNOSIS — K210 Gastro-esophageal reflux disease with esophagitis: Secondary | ICD-10-CM | POA: Diagnosis not present

## 2016-06-09 DIAGNOSIS — J9801 Acute bronchospasm: Secondary | ICD-10-CM | POA: Diagnosis not present

## 2016-06-09 DIAGNOSIS — R002 Palpitations: Secondary | ICD-10-CM | POA: Diagnosis not present

## 2016-06-10 ENCOUNTER — Other Ambulatory Visit: Payer: Self-pay

## 2016-06-10 MED ORDER — BIS SUBCIT-METRONID-TETRACYC 140-125-125 MG PO CAPS: 3.0000 | ORAL_CAPSULE | Freq: Three times a day (TID) | ORAL | 0 refills | Status: AC

## 2016-06-15 ENCOUNTER — Telehealth: Payer: Self-pay | Admitting: Gastroenterology

## 2016-06-15 DIAGNOSIS — A048 Other specified bacterial intestinal infections: Secondary | ICD-10-CM

## 2016-06-15 NOTE — Telephone Encounter (Signed)
The pt states that he has several questions to ask Dr Ardis Hughs, including why he was lied to.Marland Kitchen  He says his "wind" was cut off and his shoulder was pulled.  He also states he wanted to speak with Dr Ardis Hughs about Pylera.  He says he is "scared to take it"  I offered to make an appt to come in and discuss the issues mentioned and he states he will not be back here and was lied to and hung up.

## 2016-06-16 NOTE — Telephone Encounter (Signed)
I have no idea what he is talking about.  I called his home number, no answer, left a VM for him to call back.

## 2016-06-16 NOTE — Telephone Encounter (Signed)
He is absolutely unwilling to try pylera type antibiotics because of fear of potential side effects.  He has read that he "could die up to a year later" after taking it as well as many other issues.  I tried to assure him that severe side effects are unlikely. He is also allergic to PCN.   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

## 2016-06-16 NOTE — Telephone Encounter (Signed)
Referral has been sent to ID.

## 2016-07-21 ENCOUNTER — Encounter: Payer: Self-pay | Admitting: Infectious Disease

## 2016-07-21 ENCOUNTER — Ambulatory Visit (INDEPENDENT_AMBULATORY_CARE_PROVIDER_SITE_OTHER): Payer: PPO | Admitting: Infectious Disease

## 2016-07-21 VITALS — BP 123/74 | HR 58 | Temp 99.3°F | Ht 68.0 in | Wt 175.0 lb

## 2016-07-21 DIAGNOSIS — F4322 Adjustment disorder with anxiety: Secondary | ICD-10-CM

## 2016-07-21 DIAGNOSIS — B9681 Helicobacter pylori [H. pylori] as the cause of diseases classified elsewhere: Secondary | ICD-10-CM | POA: Diagnosis not present

## 2016-07-21 DIAGNOSIS — Z880 Allergy status to penicillin: Secondary | ICD-10-CM | POA: Diagnosis not present

## 2016-07-21 DIAGNOSIS — I2510 Atherosclerotic heart disease of native coronary artery without angina pectoris: Secondary | ICD-10-CM | POA: Diagnosis not present

## 2016-07-21 DIAGNOSIS — K2970 Gastritis, unspecified, without bleeding: Principal | ICD-10-CM

## 2016-07-21 DIAGNOSIS — F03918 Unspecified dementia, unspecified severity, with other behavioral disturbance: Secondary | ICD-10-CM

## 2016-07-21 DIAGNOSIS — F22 Delusional disorders: Secondary | ICD-10-CM

## 2016-07-21 DIAGNOSIS — F0391 Unspecified dementia with behavioral disturbance: Secondary | ICD-10-CM

## 2016-07-21 HISTORY — DX: Allergy status to penicillin: Z88.0

## 2016-07-21 HISTORY — DX: Helicobacter pylori (H. pylori) as the cause of diseases classified elsewhere: K29.70

## 2016-07-21 HISTORY — DX: Helicobacter pylori (H. pylori) as the cause of diseases classified elsewhere: B96.81

## 2016-07-21 HISTORY — DX: Atherosclerotic heart disease of native coronary artery without angina pectoris: I25.10

## 2016-07-21 NOTE — Patient Instructions (Signed)
You are welcome to come back and have the materials from the pharmacy reviewed with our pharmacist  I think you SHOULD undergo treatment with the pylera and the omeprazole for the H pylori  We can rx zofran and if needed pherergan if you have significant nausea with this regimen

## 2016-07-21 NOTE — Progress Notes (Signed)
Reason for Consult: helicobacter pylori gastritis in patient who is allergic to PCN and unwilling to take Pylera after having read the product inserts that accompanied his meds from the pharmacy  Requesting Physician: Dr. Ardis Hughs  Subjective:    Patient ID: Bill [name], male    DOB: [birth year], 75 y.o.   MRN: [id number]  HPI  75 year old Caucasian man with hx of CAD, COPD, anxiety who was seen by Dr Ardis Hughs for evaluation of chronic intermittent dyspepsia and vomiting. He underwent EGD that revealed Helicobacter pylori associated gastritis. He is allergic to PCN and so he was rx "Quadruple therapy" of PPI, bismuth, metronidazole and tetracycline.  He filled the rx which he complained "cost $900"--though in fact he paid roughly $20 for these meds he later revealed.   He and his girlfriend apparently read the package inserts that came with this medication and he specifically claimed to have found wording that stated he "might die if I take this, or lose my vision."  I assured him that sudden death or sudden visual loss were NOT at all common symptoms that were attributable to tetracyclines, metronidazole, bismuth or PPI (which he is already taking). He did endorse a PCN allergy which caused him to "breake out in welts" but then when asked admitted to having had amoxicillin though he could not recall when he had amoxicillin relative to his PCN allergy. He could not recall the year or decade or circumstances of his PCN allergy but knew that the "doctor who took care of me then is dead."  I went through an EXHAUSTIVE discussion re the nature of risks and benefits of ALL medications, procedures and even life choices.   To me it is clear that in the case of symptomatic H pylori gastritis that the benefit of 10 day course of antibiotics that are not typically toxic or dangerous (though they can be difficult to tolerate esp due to GI side effects) of treating his gastritis, preventing H pylori  associated ulcers and potential cancer outweighed risks of taking these antibiotics for 14 days.   Cassie Kupfelweiser reveiwed his medications with him. We would be happy to rx zofran for nausea if he has this with his meds.   I offered to have him come back with the documents from his pharmacy to have the specific concerns addressed by our pharmacists.    He then went on several tangents and seemed to be confabulating that his heart had "stopped" during his EGD procedure. I suspect he very likely has some form of dementia.  Past Medical History:  Diagnosis Date  . Anemia   . Anxiety   . CAD (coronary artery disease) 07/21/2016  . Cardiac arrhythmia   . COPD (chronic obstructive pulmonary disease) ([place])   . Helicobacter pylori gastritis 07/21/2016  . Penicillin allergy 07/21/2016  . Pneumonia   . Skin cancer     Past Surgical History:  Procedure Laterality Date  . APPENDECTOMY    . back fusion    . cataract surgery Left   . CHOLECYSTECTOMY      Family History  Problem Relation Age of Onset  . Colon cancer Mother   . Diabetes Father   . Heart disease Father   . Heart disease Brother   . Pancreatic cancer Cousin     x 2  . Prostate cancer Cousin   . Heart disease Son       Social History   Social History  . Marital status: Single  Spouse name: N/A  . Number of children: N/A  . Years of education: N/A   Social History Main Topics  . Smoking status: Former Research scientist (life sciences)  . Smokeless tobacco: Never Used  . Alcohol use No  . Drug use: No  . Sexual activity: Not Asked   Other Topics Concern  . None   Social History Narrative  . None    Allergies  Allergen Reactions  . Aspirin   . Penicillins   . Percocet [Oxycodone-Acetaminophen]   . Prednisone      Current Outpatient Prescriptions:  .  acetaminophen (TYLENOL) 500 MG tablet, Take 1,000 mg by mouth daily as needed., Disp: , Rfl:  .  albuterol (PROVENTIL HFA;VENTOLIN HFA) 108 (90 Base) MCG/ACT inhaler,  Inhale 2 puffs into the lungs every 6 (six) hours as needed for wheezing or shortness of breath., Disp: , Rfl:  .  aspirin 81 MG tablet, Take 81 mg by mouth daily., Disp: , Rfl:  .  b complex vitamins tablet, Take 1 tablet by mouth daily., Disp: , Rfl:  .  bisoprolol (ZEBETA) 5 MG tablet, Take 2.5 mg by mouth daily., Disp: , Rfl:  .  Calcium Citrate-Vitamin D (CALCIUM CITRATE +D PO), Take 1 capsule by mouth daily., Disp: , Rfl:  .  Carboxymethylcellulose Sodium (LUBRICANT EYE DROPS OP), Apply 1 drop to eye as needed., Disp: , Rfl:  .  clotrimazole-betamethasone (LOTRISONE) cream, , Disp: , Rfl:  .  cyanocobalamin 1000 MCG tablet, Take 1,000 mcg by mouth daily., Disp: , Rfl:  .  GLUCOSAMINE CHONDROITIN COMPLX PO, Take 1 tablet by mouth daily., Disp: , Rfl:  .  loratadine (CLARITIN) 10 MG tablet, Take 10 mg by mouth daily., Disp: , Rfl:  .  Magnesium Oxide 500 MG CAPS, Take 2 capsules by mouth daily., Disp: , Rfl:  .  Multiple Vitamins-Minerals (PRESERVISION AREDS 2 PO), Take 1 tablet by mouth 2 (two) times daily., Disp: , Rfl:  .  nitroGLYCERIN (NITROSTAT) 0.4 MG SL tablet, Place 0.4 mg under the tongue every 5 (five) minutes as needed for chest pain., Disp: , Rfl:  .  Potassium 99 MG TABS, Take 1 tablet by mouth daily., Disp: , Rfl:  .  RaNITidine HCl (RANITIDINE ACID REDUCER PO), Take 1 tablet by mouth at bedtime., Disp: , Rfl:  .  vitamin E 400 UNIT capsule, Take 400 Units by mouth daily., Disp: , Rfl:  .  esomeprazole (NEXIUM) 40 MG capsule, Take 40 mg by mouth daily as needed., Disp: , Rfl:  .  multivitamin-lutein (OCUVITE-LUTEIN) CAPS capsule, Take 1 capsule by mouth daily., Disp: , Rfl:  .  omeprazole (PRILOSEC) 40 MG capsule, Take 1 capsule (40 mg total) by mouth daily. (Patient not taking: Reported on 07/21/2016), Disp: 30 capsule, Rfl: 6  Current Facility-Administered Medications:  .  0.9 %  sodium chloride infusion, 500 mL, Intravenous, Continuous, Milus Banister, MD    Review of  Systems  Constitutional: Negative for chills and fever.  HENT: Negative for sore throat.   Respiratory: Negative for cough and shortness of breath.   Cardiovascular: Negative for palpitations.  Gastrointestinal: Positive for abdominal pain, nausea and vomiting. Negative for diarrhea.  Musculoskeletal: Negative for myalgias.  Skin: Negative for rash.  Neurological: Negative for weakness.  Psychiatric/Behavioral: Positive for agitation. The patient is nervous/anxious.        Objective:   Physical Exam  Constitutional: He is oriented to person, place, and time. He appears well-developed and well-nourished.  HENT:  Head: Normocephalic  and atraumatic.  Eyes: Conjunctivae and EOM are normal.  Neck: Normal range of motion. Neck supple.  Cardiovascular: Normal rate and regular rhythm.   Pulmonary/Chest: Effort normal. No respiratory distress. He has no wheezes.  Abdominal: Soft. He exhibits no distension.  Musculoskeletal: Normal range of motion. He exhibits no edema or tenderness.  Neurological: He is alert and oriented to person, place, and time.  Skin: Skin is warm and dry. No rash noted. No erythema. No pallor.  Psychiatric: His mood appears anxious. His affect is labile. His speech is tangential. Thought content is paranoid. Cognition and memory are impaired. He exhibits abnormal recent memory and abnormal remote memory.          Assessment & Plan:    Helicobacter pylori gastritis:  Would agree with "quadruple therapy of bismuth, tetracycline, metronidazole, PPI x 14 days  Would consider premedicating with zofran and prn and if this does not work then with phenergan but with no driving.  PCN allergy: could not elucidate if he has had amox after this apparent allergic  rxn  Anxiety: as below I wonder if he has undiagnosed dementia or if this is more anxiety driven. I wonder why he suddenly began to read product labels when he was not so doing for azithromycin for  example  CAD: on aspirin  ? Dementia: much of his presentation to me seems suspicious for dementia of likely Alzheimers vs Vascular type. He confabulated a fair amount perseverated about several issues including possible cardiac arrest during EGD, not getting proper care from PCP and other concerns.   I spent greater than 60 minutes with the patient including greater than 50% of time in face to face counsel of the patient re his H. Pylori gastritis, his PCN allergy his anxiety re the labels, his CAD   and in coordination of his  care.

## 2016-07-27 DIAGNOSIS — M75121 Complete rotator cuff tear or rupture of right shoulder, not specified as traumatic: Secondary | ICD-10-CM | POA: Diagnosis not present

## 2016-07-27 DIAGNOSIS — M7581 Other shoulder lesions, right shoulder: Secondary | ICD-10-CM | POA: Diagnosis not present

## 2016-11-24 DIAGNOSIS — E538 Deficiency of other specified B group vitamins: Secondary | ICD-10-CM | POA: Diagnosis not present

## 2016-11-24 DIAGNOSIS — J431 Panlobular emphysema: Secondary | ICD-10-CM | POA: Diagnosis not present

## 2016-11-24 DIAGNOSIS — K210 Gastro-esophageal reflux disease with esophagitis: Secondary | ICD-10-CM | POA: Diagnosis not present

## 2016-12-02 DIAGNOSIS — K210 Gastro-esophageal reflux disease with esophagitis: Secondary | ICD-10-CM | POA: Diagnosis not present

## 2017-01-05 DIAGNOSIS — R05 Cough: Secondary | ICD-10-CM | POA: Diagnosis not present

## 2017-01-05 DIAGNOSIS — I209 Angina pectoris, unspecified: Secondary | ICD-10-CM | POA: Diagnosis not present

## 2017-01-05 DIAGNOSIS — L570 Actinic keratosis: Secondary | ICD-10-CM | POA: Diagnosis not present

## 2017-01-05 DIAGNOSIS — J431 Panlobular emphysema: Secondary | ICD-10-CM | POA: Diagnosis not present

## 2017-01-05 DIAGNOSIS — Z0000 Encounter for general adult medical examination without abnormal findings: Secondary | ICD-10-CM

## 2017-01-05 DIAGNOSIS — E538 Deficiency of other specified B group vitamins: Secondary | ICD-10-CM | POA: Diagnosis not present

## 2017-01-05 HISTORY — DX: Encounter for general adult medical examination without abnormal findings: Z00.00

## 2017-02-11 DIAGNOSIS — R1084 Generalized abdominal pain: Secondary | ICD-10-CM | POA: Diagnosis not present

## 2017-02-11 DIAGNOSIS — A09 Infectious gastroenteritis and colitis, unspecified: Secondary | ICD-10-CM | POA: Diagnosis not present

## 2017-02-16 DIAGNOSIS — H353111 Nonexudative age-related macular degeneration, right eye, early dry stage: Secondary | ICD-10-CM | POA: Diagnosis not present

## 2017-02-16 DIAGNOSIS — H52203 Unspecified astigmatism, bilateral: Secondary | ICD-10-CM | POA: Diagnosis not present

## 2017-02-16 DIAGNOSIS — H2511 Age-related nuclear cataract, right eye: Secondary | ICD-10-CM | POA: Diagnosis not present

## 2017-02-16 DIAGNOSIS — H524 Presbyopia: Secondary | ICD-10-CM | POA: Diagnosis not present

## 2017-02-16 DIAGNOSIS — H353122 Nonexudative age-related macular degeneration, left eye, intermediate dry stage: Secondary | ICD-10-CM | POA: Diagnosis not present

## 2017-02-24 DIAGNOSIS — H2511 Age-related nuclear cataract, right eye: Secondary | ICD-10-CM | POA: Diagnosis not present

## 2017-02-24 DIAGNOSIS — H269 Unspecified cataract: Secondary | ICD-10-CM | POA: Diagnosis not present

## 2017-03-25 ENCOUNTER — Telehealth: Payer: Self-pay | Admitting: Gastroenterology

## 2017-03-25 NOTE — Telephone Encounter (Signed)
The pt states he has a referral from his PCP and will have [facility] request any records needed.  He states he does not wish to see Dr Ardis Hughs any more because of "things" that happened and he did not want to go into it any further.

## 2017-04-07 DIAGNOSIS — K219 Gastro-esophageal reflux disease without esophagitis: Secondary | ICD-10-CM | POA: Diagnosis not present

## 2017-04-07 DIAGNOSIS — W57XXXA Bitten or stung by nonvenomous insect and other nonvenomous arthropods, initial encounter: Secondary | ICD-10-CM | POA: Diagnosis not present

## 2017-05-12 DIAGNOSIS — G4733 Obstructive sleep apnea (adult) (pediatric): Secondary | ICD-10-CM | POA: Diagnosis not present

## 2017-05-12 DIAGNOSIS — Z0000 Encounter for general adult medical examination without abnormal findings: Secondary | ICD-10-CM | POA: Diagnosis not present

## 2017-05-12 DIAGNOSIS — Z125 Encounter for screening for malignant neoplasm of prostate: Secondary | ICD-10-CM | POA: Diagnosis not present

## 2017-05-12 DIAGNOSIS — E538 Deficiency of other specified B group vitamins: Secondary | ICD-10-CM | POA: Diagnosis not present

## 2017-05-12 DIAGNOSIS — J431 Panlobular emphysema: Secondary | ICD-10-CM | POA: Diagnosis not present

## 2017-05-12 DIAGNOSIS — E782 Mixed hyperlipidemia: Secondary | ICD-10-CM | POA: Diagnosis not present

## 2017-05-13 DIAGNOSIS — J431 Panlobular emphysema: Secondary | ICD-10-CM | POA: Diagnosis not present

## 2017-05-13 DIAGNOSIS — Z0000 Encounter for general adult medical examination without abnormal findings: Secondary | ICD-10-CM | POA: Diagnosis not present

## 2017-05-13 DIAGNOSIS — E538 Deficiency of other specified B group vitamins: Secondary | ICD-10-CM | POA: Diagnosis not present

## 2017-05-13 DIAGNOSIS — G4733 Obstructive sleep apnea (adult) (pediatric): Secondary | ICD-10-CM | POA: Diagnosis not present

## 2017-05-13 DIAGNOSIS — E782 Mixed hyperlipidemia: Secondary | ICD-10-CM | POA: Diagnosis not present

## 2017-05-13 DIAGNOSIS — Z125 Encounter for screening for malignant neoplasm of prostate: Secondary | ICD-10-CM | POA: Diagnosis not present

## 2017-05-19 DIAGNOSIS — H02052 Trichiasis without entropian right lower eyelid: Secondary | ICD-10-CM | POA: Diagnosis not present

## 2017-06-04 DIAGNOSIS — S6991XA Unspecified injury of right wrist, hand and finger(s), initial encounter: Secondary | ICD-10-CM | POA: Diagnosis not present

## 2017-06-04 DIAGNOSIS — S67194A Crushing injury of right ring finger, initial encounter: Secondary | ICD-10-CM | POA: Diagnosis not present

## 2017-07-06 DIAGNOSIS — L918 Other hypertrophic disorders of the skin: Secondary | ICD-10-CM | POA: Diagnosis not present

## 2017-07-06 DIAGNOSIS — L989 Disorder of the skin and subcutaneous tissue, unspecified: Secondary | ICD-10-CM | POA: Diagnosis not present

## 2017-07-06 DIAGNOSIS — L570 Actinic keratosis: Secondary | ICD-10-CM | POA: Diagnosis not present

## 2017-07-06 DIAGNOSIS — D1801 Hemangioma of skin and subcutaneous tissue: Secondary | ICD-10-CM | POA: Diagnosis not present

## 2017-07-07 DIAGNOSIS — Z880 Allergy status to penicillin: Secondary | ICD-10-CM | POA: Diagnosis not present

## 2017-07-07 DIAGNOSIS — M47812 Spondylosis without myelopathy or radiculopathy, cervical region: Secondary | ICD-10-CM | POA: Diagnosis not present

## 2017-07-07 DIAGNOSIS — Z886 Allergy status to analgesic agent status: Secondary | ICD-10-CM | POA: Diagnosis not present

## 2017-07-07 DIAGNOSIS — R1032 Left lower quadrant pain: Secondary | ICD-10-CM | POA: Diagnosis not present

## 2017-07-07 DIAGNOSIS — Z87891 Personal history of nicotine dependence: Secondary | ICD-10-CM | POA: Diagnosis not present

## 2017-07-07 DIAGNOSIS — R51 Headache: Secondary | ICD-10-CM | POA: Diagnosis not present

## 2017-07-07 DIAGNOSIS — Z7982 Long term (current) use of aspirin: Secondary | ICD-10-CM | POA: Diagnosis not present

## 2017-07-07 DIAGNOSIS — M542 Cervicalgia: Secondary | ICD-10-CM | POA: Diagnosis not present

## 2017-07-07 DIAGNOSIS — Z79899 Other long term (current) drug therapy: Secondary | ICD-10-CM | POA: Diagnosis not present

## 2017-07-07 DIAGNOSIS — I2510 Atherosclerotic heart disease of native coronary artery without angina pectoris: Secondary | ICD-10-CM | POA: Diagnosis not present

## 2017-07-07 DIAGNOSIS — Z888 Allergy status to other drugs, medicaments and biological substances status: Secondary | ICD-10-CM | POA: Diagnosis not present

## 2017-07-07 DIAGNOSIS — I252 Old myocardial infarction: Secondary | ICD-10-CM | POA: Diagnosis not present

## 2017-07-07 DIAGNOSIS — S39012A Strain of muscle, fascia and tendon of lower back, initial encounter: Secondary | ICD-10-CM | POA: Diagnosis not present

## 2017-07-07 DIAGNOSIS — I10 Essential (primary) hypertension: Secondary | ICD-10-CM | POA: Diagnosis not present

## 2017-07-07 DIAGNOSIS — Z885 Allergy status to narcotic agent status: Secondary | ICD-10-CM | POA: Diagnosis not present

## 2017-07-09 DIAGNOSIS — K760 Fatty (change of) liver, not elsewhere classified: Secondary | ICD-10-CM | POA: Diagnosis not present

## 2017-07-09 DIAGNOSIS — R1084 Generalized abdominal pain: Secondary | ICD-10-CM | POA: Diagnosis not present

## 2017-07-09 DIAGNOSIS — R61 Generalized hyperhidrosis: Secondary | ICD-10-CM | POA: Diagnosis not present

## 2017-07-09 DIAGNOSIS — R110 Nausea: Secondary | ICD-10-CM | POA: Diagnosis not present

## 2017-07-09 DIAGNOSIS — I700 Atherosclerosis of aorta: Secondary | ICD-10-CM | POA: Diagnosis not present

## 2017-07-09 DIAGNOSIS — R102 Pelvic and perineal pain: Secondary | ICD-10-CM | POA: Diagnosis not present

## 2017-07-09 DIAGNOSIS — R109 Unspecified abdominal pain: Secondary | ICD-10-CM | POA: Diagnosis not present

## 2017-07-26 DIAGNOSIS — L600 Ingrowing nail: Secondary | ICD-10-CM | POA: Diagnosis not present

## 2017-07-26 DIAGNOSIS — L03031 Cellulitis of right toe: Secondary | ICD-10-CM | POA: Diagnosis not present

## 2017-08-04 DIAGNOSIS — R1032 Left lower quadrant pain: Secondary | ICD-10-CM | POA: Diagnosis not present

## 2017-08-04 DIAGNOSIS — R52 Pain, unspecified: Secondary | ICD-10-CM | POA: Diagnosis not present

## 2017-09-01 DIAGNOSIS — Z9049 Acquired absence of other specified parts of digestive tract: Secondary | ICD-10-CM | POA: Diagnosis not present

## 2017-09-01 DIAGNOSIS — S199XXA Unspecified injury of neck, initial encounter: Secondary | ICD-10-CM | POA: Diagnosis not present

## 2017-09-01 DIAGNOSIS — G44209 Tension-type headache, unspecified, not intractable: Secondary | ICD-10-CM | POA: Diagnosis not present

## 2017-09-01 DIAGNOSIS — I700 Atherosclerosis of aorta: Secondary | ICD-10-CM | POA: Diagnosis not present

## 2017-09-01 DIAGNOSIS — R109 Unspecified abdominal pain: Secondary | ICD-10-CM | POA: Diagnosis not present

## 2017-09-01 DIAGNOSIS — S0990XA Unspecified injury of head, initial encounter: Secondary | ICD-10-CM | POA: Diagnosis not present

## 2017-09-01 DIAGNOSIS — Z7982 Long term (current) use of aspirin: Secondary | ICD-10-CM | POA: Diagnosis not present

## 2017-09-01 DIAGNOSIS — M542 Cervicalgia: Secondary | ICD-10-CM | POA: Diagnosis not present

## 2017-09-01 DIAGNOSIS — R51 Headache: Secondary | ICD-10-CM | POA: Diagnosis not present

## 2017-09-01 DIAGNOSIS — Z87891 Personal history of nicotine dependence: Secondary | ICD-10-CM | POA: Diagnosis not present

## 2017-09-01 DIAGNOSIS — R079 Chest pain, unspecified: Secondary | ICD-10-CM | POA: Diagnosis not present

## 2017-09-01 DIAGNOSIS — K760 Fatty (change of) liver, not elsewhere classified: Secondary | ICD-10-CM | POA: Diagnosis not present

## 2017-09-30 DIAGNOSIS — H0100A Unspecified blepharitis right eye, upper and lower eyelids: Secondary | ICD-10-CM | POA: Diagnosis not present

## 2017-09-30 DIAGNOSIS — H353111 Nonexudative age-related macular degeneration, right eye, early dry stage: Secondary | ICD-10-CM | POA: Diagnosis not present

## 2017-09-30 DIAGNOSIS — Z961 Presence of intraocular lens: Secondary | ICD-10-CM | POA: Diagnosis not present

## 2017-09-30 DIAGNOSIS — H353122 Nonexudative age-related macular degeneration, left eye, intermediate dry stage: Secondary | ICD-10-CM | POA: Diagnosis not present

## 2017-10-11 DIAGNOSIS — R6884 Jaw pain: Secondary | ICD-10-CM | POA: Diagnosis not present

## 2017-10-11 DIAGNOSIS — S134XXA Sprain of ligaments of cervical spine, initial encounter: Secondary | ICD-10-CM | POA: Diagnosis not present

## 2017-10-11 DIAGNOSIS — M542 Cervicalgia: Secondary | ICD-10-CM | POA: Diagnosis not present

## 2017-10-26 DIAGNOSIS — M79672 Pain in left foot: Secondary | ICD-10-CM | POA: Diagnosis not present

## 2017-10-26 DIAGNOSIS — Y998 Other external cause status: Secondary | ICD-10-CM | POA: Diagnosis not present

## 2017-10-26 DIAGNOSIS — W010XXA Fall on same level from slipping, tripping and stumbling without subsequent striking against object, initial encounter: Secondary | ICD-10-CM | POA: Diagnosis not present

## 2017-10-26 DIAGNOSIS — M791 Myalgia, unspecified site: Secondary | ICD-10-CM | POA: Diagnosis not present

## 2017-10-26 DIAGNOSIS — S93402A Sprain of unspecified ligament of left ankle, initial encounter: Secondary | ICD-10-CM | POA: Diagnosis not present

## 2017-10-26 DIAGNOSIS — S93492A Sprain of other ligament of left ankle, initial encounter: Secondary | ICD-10-CM | POA: Diagnosis not present

## 2017-10-26 DIAGNOSIS — S99922A Unspecified injury of left foot, initial encounter: Secondary | ICD-10-CM | POA: Diagnosis not present

## 2017-12-14 ENCOUNTER — Encounter: Payer: Self-pay | Admitting: Family Medicine

## 2017-12-14 ENCOUNTER — Ambulatory Visit (INDEPENDENT_AMBULATORY_CARE_PROVIDER_SITE_OTHER): Payer: PPO | Admitting: Family Medicine

## 2017-12-14 VITALS — BP 118/70 | HR 65 | Temp 97.7°F | Ht 67.0 in | Wt 178.0 lb

## 2017-12-14 DIAGNOSIS — I2510 Atherosclerotic heart disease of native coronary artery without angina pectoris: Secondary | ICD-10-CM

## 2017-12-14 DIAGNOSIS — K2970 Gastritis, unspecified, without bleeding: Secondary | ICD-10-CM

## 2017-12-14 DIAGNOSIS — B9681 Helicobacter pylori [H. pylori] as the cause of diseases classified elsewhere: Secondary | ICD-10-CM | POA: Diagnosis not present

## 2017-12-14 DIAGNOSIS — J439 Emphysema, unspecified: Secondary | ICD-10-CM

## 2017-12-14 HISTORY — DX: Emphysema, unspecified: J43.9

## 2017-12-14 MED ORDER — PANTOPRAZOLE SODIUM 40 MG PO TBEC: 40.0000 mg | DELAYED_RELEASE_TABLET | Freq: Every day | ORAL | 3 refills | Status: DC
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
## 2017-12-23 ENCOUNTER — Ambulatory Visit (INDEPENDENT_AMBULATORY_CARE_PROVIDER_SITE_OTHER): Payer: PPO | Admitting: Adult Health

## 2017-12-23 ENCOUNTER — Encounter: Payer: Self-pay | Admitting: Adult Health

## 2017-12-23 VITALS — BP 144/80 | Temp 98.7°F | Wt 181.0 lb

## 2017-12-23 DIAGNOSIS — J069 Acute upper respiratory infection, unspecified: Secondary | ICD-10-CM

## 2017-12-23 MED ORDER — DOXYCYCLINE HYCLATE 100 MG PO CAPS: 100.0000 mg | ORAL_CAPSULE | Freq: Two times a day (BID) | ORAL | 0 refills | Status: DC

## 2017-12-23 NOTE — Progress Notes (Signed)
Subjective:    Patient ID: Bill [name], male    DOB: Mar 03, 1941, 77 y.o.   MRN: [id number]  URI   This is a new problem. The current episode started in the past 7 days. The problem has been gradually worsening. There has been no fever (subjective ). Associated symptoms include congestion, coughing (semi productive ), headaches, rhinorrhea, sinus pain and wheezing. He has tried nothing for the symptoms. The treatment provided no relief.      Review of Systems  Constitutional: Positive for activity change, fatigue and fever. Negative for chills and diaphoresis.  HENT: Positive for congestion, rhinorrhea, sinus pressure and sinus pain. Negative for trouble swallowing.   Respiratory: Positive for cough (semi productive ), chest tightness and wheezing.   Cardiovascular: Negative.   Gastrointestinal: Negative.   Genitourinary: Negative.   Musculoskeletal: Positive for myalgias.  Neurological: Positive for headaches.  Psychiatric/Behavioral: Negative.    Past Medical History:  Diagnosis Date  . Anemia   . Anxiety   . CAD (coronary artery disease) 07/21/2016  . Cardiac arrhythmia   . COPD (chronic obstructive pulmonary disease) ([place])   . Helicobacter pylori gastritis 07/21/2016  . Penicillin allergy 07/21/2016  . Pneumonia   . Skin cancer     Social History   Socioeconomic History  . Marital status: Single    Spouse name: Not on file  . Number of children: Not on file  . Years of education: Not on file  . Highest education level: Not on file  Social Needs  . Financial resource strain: Not on file  . Food insecurity - worry: Not on file  . Food insecurity - inability: Not on file  . Transportation needs - medical: Not on file  . Transportation needs - non-medical: Not on file  Occupational History  . Not on file  Tobacco Use  . Smoking status: Former Research scientist (life sciences)  . Smokeless tobacco: Never Used  Substance and Sexual Activity  . Alcohol use: No  . Drug use: No  .  Sexual activity: Not on file  Other Topics Concern  . Not on file  Social History Narrative  . Not on file    Past Surgical History:  Procedure Laterality Date  . APPENDECTOMY    . back fusion    . cataract surgery Left   . CHOLECYSTECTOMY    . COLONOSCOPY  2014   clear   . SPINE SURGERY  1984   thoracic spine fusion     Family History  Problem Relation Age of Onset  . Colon cancer Mother   . Diabetes Father   . Heart disease Father   . Stroke Father   . Hypertension Father   . Hyperlipidemia Father   . Heart disease Brother   . Pancreatic cancer Cousin        x 2  . Prostate cancer Cousin   . Heart disease Son     Allergies  Allergen Reactions  . Aspirin   . Penicillins   . Percocet [Oxycodone-Acetaminophen]   . Prednisone     Current Outpatient Medications on File Prior to Visit  Medication Sig Dispense Refill  . albuterol (PROVENTIL HFA;VENTOLIN HFA) 108 (90 Base) MCG/ACT inhaler Inhale 2 puffs into the lungs every 6 (six) hours as needed for wheezing or shortness of breath.    Marland Kitchen aspirin 81 MG tablet Take 81 mg by mouth daily.    Marland Kitchen b complex vitamins tablet Take 1 tablet by mouth daily.    . bisoprolol (  ZEBETA) 5 MG tablet Take 2.5 mg by mouth daily.    . Calcium Citrate-Vitamin D (CALCIUM CITRATE +D PO) Take 1 capsule by mouth daily.    . carboxymethylcellulose (REFRESH PLUS) 0.5 % SOLN 1 drop daily as needed.    . Carboxymethylcellulose Sodium (LUBRICANT EYE DROPS OP) Apply 1 drop to eye as needed.    . clotrimazole-betamethasone (LOTRISONE) cream     . cyanocobalamin 1000 MCG tablet Take 1,000 mcg by mouth daily.    . Flaxseed Oil OIL by Does not apply route.    . loratadine (CLARITIN) 10 MG tablet Take 10 mg by mouth daily.    . Magnesium Oxide 500 MG CAPS Take 2 capsules by mouth daily.    . Multiple Vitamins-Minerals (PRESERVISION AREDS 2 PO) Take 1 tablet by mouth 2 (two) times daily.    . multivitamin-lutein (OCUVITE-LUTEIN) CAPS capsule Take 1  capsule by mouth daily.    . pantoprazole (PROTONIX) 40 MG tablet Take 1 tablet (40 mg total) by mouth daily. 90 tablet 3  . Potassium 99 MG TABS Take 1 tablet by mouth daily.    . vitamin E 400 UNIT capsule Take 400 Units by mouth daily.     Current Facility-Administered Medications on File Prior to Visit  Medication Dose Route Frequency Provider Last Rate Last Dose  . 0.9 %  sodium chloride infusion  500 mL Intravenous Continuous Milus Banister, MD        BP (!) 144/80   Temp 98.7 F (37.1 C) (Oral)   Wt 181 lb (82.1 kg)   BMI 28.35 kg/m       Objective:   Physical Exam  Constitutional: He is oriented to person, place, and time. He appears well-developed and well-nourished. No distress.  HENT:  Head: Normocephalic and atraumatic.  Right Ear: External ear normal.  Left Ear: External ear normal.  Nose: Nose normal.  Mouth/Throat: Oropharynx is clear and moist. No oropharyngeal exudate.  Eyes: Conjunctivae and EOM are normal. Pupils are equal, round, and reactive to light. Right eye exhibits no discharge. Left eye exhibits no discharge. No scleral icterus.  Neck: Normal range of motion. Neck supple.  Cardiovascular: Normal rate, regular rhythm, normal heart sounds and intact distal pulses. Exam reveals no gallop and no friction rub.  No murmur heard. Pulmonary/Chest: Effort normal. No respiratory distress. He has wheezes. He has rhonchi. He has no rales. He exhibits no tenderness.  Lymphadenopathy:    He has no cervical adenopathy.  Neurological: He is alert and oriented to person, place, and time.  Skin: Skin is warm and dry. No rash noted. He is not diaphoretic. No erythema. No pallor.  Psychiatric: He has a normal mood and affect. His behavior is normal. Judgment and thought content normal.  Nursing note and vitals reviewed.     Assessment & Plan:  1. Upper respiratory tract infection, unspecified type - doxycycline (VIBRAMYCIN) 100 MG capsule; Take 1 capsule (100 mg  total) by mouth 2 (two) times daily.  Dispense: 14 capsule; Refill: 0 - Follow up if no improvement in the next 2-3 days   Dorothyann Peng, NP

## 2018-02-08 ENCOUNTER — Encounter: Payer: Self-pay | Admitting: Family Medicine

## 2018-02-08 ENCOUNTER — Ambulatory Visit (INDEPENDENT_AMBULATORY_CARE_PROVIDER_SITE_OTHER): Payer: PPO | Admitting: Family Medicine

## 2018-02-08 VITALS — BP 104/70 | HR 52 | Temp 98.4°F | Ht 67.0 in | Wt 180.4 lb

## 2018-02-08 DIAGNOSIS — M2550 Pain in unspecified joint: Secondary | ICD-10-CM

## 2018-02-08 DIAGNOSIS — W57XXXA Bitten or stung by nonvenomous insect and other nonvenomous arthropods, initial encounter: Secondary | ICD-10-CM

## 2018-02-08 MED ORDER — DOXYCYCLINE HYCLATE 100 MG PO CAPS: 100.0000 mg | ORAL_CAPSULE | Freq: Two times a day (BID) | ORAL | 0 refills | Status: AC

## 2018-02-08 NOTE — Progress Notes (Signed)
   Subjective:    Patient ID: Bill [name], male    DOB: June 26, 1941, 77 y.o.   MRN: [id number]  HPI Here with concerns about tick bites. He has been bitten by 3 ticks in the past 4 weeks. They bring in these 3 ticks today in a jar, and they appear not to be deer ticks. He denies any rashes, but he has had some diffuse join pains, fatigue, and a mild headache. No fever. Using Tylenol.   Review of Systems  Constitutional: Positive for fatigue. Negative for fever.  Respiratory: Negative.   Cardiovascular: Negative.   Musculoskeletal: Positive for arthralgias.  Neurological: Positive for headaches.       Objective:   Physical Exam  Constitutional: He is oriented to person, place, and time. He appears well-developed and well-nourished.  Cardiovascular: Normal rate, regular rhythm, normal heart sounds and intact distal pulses.  Pulmonary/Chest: Effort normal and breath sounds normal. No respiratory distress. He has no wheezes. He has no rales.  Neurological: He is alert and oriented to person, place, and time.  Skin: No rash noted. No erythema.          Assessment & Plan:  Recent tick bites. Cover with Doxycycline.  Alysia Penna, MD

## 2018-03-08 ENCOUNTER — Telehealth: Payer: Self-pay | Admitting: Family Medicine

## 2018-03-08 NOTE — Telephone Encounter (Signed)
Last filled by historical provider. Pt states that Dr. Sarajane Jews has not prescribed this medication before.   LOV: 02/08/18  [facility]   [address]

## 2018-03-08 NOTE — Telephone Encounter (Signed)
Copied from [place] [id number]. Topic: Quick Communication - Rx Refill/Question >> Mar 08, 2018  3:01 PM Selinda Flavin B, [place] wrote: Medication: bisoprolol (ZEBETA) 5 MG tablet Has the patient contacted their pharmacy? Yes. (Agent: If no, request that the patient contact the pharmacy for the refill.) Preferred Pharmacy (with phone number or street name): [place] ([place] [place] Agent: Please be advised that RX refills may take up to 3 business days. We ask that you follow-up with your pharmacy.  States that Dr Sarajane Jews has not prescribed this medication for him yet.

## 2018-03-09 NOTE — Telephone Encounter (Signed)
Also need to change PCP if indeed Dr. Sarajane Jews has accepted patient.

## 2018-03-11 MED ORDER — BISOPROLOL FUMARATE 5 MG PO TABS: 2.5000 mg | ORAL_TABLET | Freq: Every day | ORAL | 3 refills | Status: DC

## 2018-03-11 NOTE — Telephone Encounter (Signed)
Yes I accepted him, and please refill Zebeta for a year

## 2018-03-11 NOTE — Telephone Encounter (Signed)
Called patient and confirmed current dosing regimen.  Medication filled to pharmacy as requested.

## 2018-03-11 NOTE — Telephone Encounter (Signed)
Please advise 

## 2018-04-01 ENCOUNTER — Telehealth: Payer: Self-pay | Admitting: Family Medicine

## 2018-04-01 NOTE — Telephone Encounter (Signed)
Copied from [place] [id number]. Topic: Quick Communication - See Telephone Encounter >> Apr 01, 2018 12:22 PM Aurelio Brash B wrote: CRM for notification. See Telephone encounter for: 04/01/18.  PT states he got more ticks off him and wants to know if he can get a refill on doxycycline (VIBRAMYCIN) 100 MG capsule   [place] ([place]), [place] - [place] [phone] (Phone) [phone] (Fax)

## 2018-04-01 NOTE — Telephone Encounter (Signed)
Please advise 

## 2018-04-04 MED ORDER — DOXYCYCLINE HYCLATE 100 MG PO TABS: 100.0000 mg | ORAL_TABLET | Freq: Two times a day (BID) | ORAL | 0 refills | Status: DC

## 2018-04-04 NOTE — Telephone Encounter (Signed)
Call in Doxycyline 100 mg bid for 10 days

## 2018-04-04 NOTE — Telephone Encounter (Signed)
Called pt and left a VM that medication was sent into requested [facility].

## 2018-04-05 DIAGNOSIS — Z961 Presence of intraocular lens: Secondary | ICD-10-CM | POA: Diagnosis not present

## 2018-04-05 DIAGNOSIS — H43813 Vitreous degeneration, bilateral: Secondary | ICD-10-CM | POA: Diagnosis not present

## 2018-04-05 DIAGNOSIS — H353111 Nonexudative age-related macular degeneration, right eye, early dry stage: Secondary | ICD-10-CM | POA: Diagnosis not present

## 2018-04-05 DIAGNOSIS — H353122 Nonexudative age-related macular degeneration, left eye, intermediate dry stage: Secondary | ICD-10-CM | POA: Diagnosis not present

## 2018-04-14 ENCOUNTER — Encounter: Payer: Self-pay | Admitting: Family Medicine

## 2018-04-14 ENCOUNTER — Ambulatory Visit (INDEPENDENT_AMBULATORY_CARE_PROVIDER_SITE_OTHER): Payer: PPO | Admitting: Family Medicine

## 2018-04-14 VITALS — BP 114/60 | HR 53 | Temp 97.9°F | Ht 67.0 in | Wt 179.8 lb

## 2018-04-14 DIAGNOSIS — R519 Headache, unspecified: Secondary | ICD-10-CM | POA: Insufficient documentation

## 2018-04-14 DIAGNOSIS — R51 Headache: Secondary | ICD-10-CM | POA: Diagnosis not present

## 2018-04-14 HISTORY — DX: Headache, unspecified: R51.9

## 2018-04-14 MED ORDER — DICLOFENAC POTASSIUM 50 MG PO TABS
50.0000 mg | ORAL_TABLET | Freq: Three times a day (TID) | ORAL | 0 refills | Status: DC | PRN
Start: 2018-04-14 — End: 2020-02-15
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
## 2018-04-20 ENCOUNTER — Encounter: Payer: Self-pay | Admitting: Neurology

## 2018-05-20 ENCOUNTER — Ambulatory Visit (INDEPENDENT_AMBULATORY_CARE_PROVIDER_SITE_OTHER): Payer: PPO | Admitting: Family Medicine

## 2018-05-20 ENCOUNTER — Encounter: Payer: Self-pay | Admitting: Family Medicine

## 2018-05-20 VITALS — BP 108/62 | HR 62 | Temp 98.3°F | Ht 67.0 in | Wt 178.2 lb

## 2018-05-20 DIAGNOSIS — Z0000 Encounter for general adult medical examination without abnormal findings: Secondary | ICD-10-CM | POA: Diagnosis not present

## 2018-05-20 LAB — CBC WITH DIFFERENTIAL/PLATELET
Basophils Absolute: 0.2 10*3/uL — ABNORMAL HIGH (ref 0.0–0.1)
Basophils Relative: 1.5 % (ref 0.0–3.0)
Eosinophils Absolute: 0.5 10*3/uL (ref 0.0–0.7)
Eosinophils Relative: 3.8 % (ref 0.0–5.0)
HCT: 44 % (ref 39.0–52.0)
Hemoglobin: 14.7 g/dL (ref 13.0–17.0)
Lymphocytes Relative: 23.1 % (ref 12.0–46.0)
Lymphs Abs: 3.2 10*3/uL (ref 0.7–4.0)
MCHC: 33.4 g/dL (ref 30.0–36.0)
MCV: 95.5 fl (ref 78.0–100.0)
Monocytes Absolute: 0.8 10*3/uL (ref 0.1–1.0)
Monocytes Relative: 5.8 % (ref 3.0–12.0)
Neutro Abs: 9.2 10*3/uL — ABNORMAL HIGH (ref 1.4–7.7)
Neutrophils Relative %: 65.8 % (ref 43.0–77.0)
Platelets: 366 10*3/uL (ref 150.0–400.0)
RBC: 4.61 Mil/uL (ref 4.22–5.81)
RDW: 17.7 % — ABNORMAL HIGH (ref 11.5–15.5)
WBC: 13.9 10*3/uL — ABNORMAL HIGH (ref 4.0–10.5)

## 2018-05-20 LAB — POC URINALSYSI DIPSTICK (AUTOMATED)
Bilirubin, UA: NEGATIVE
Blood, UA: NEGATIVE
Glucose, UA: NEGATIVE
Ketones, UA: NEGATIVE
Leukocytes, UA: NEGATIVE
Nitrite, UA: NEGATIVE
Protein, UA: NEGATIVE
Spec Grav, UA: >=1.03 — AB (ref 1.010–1.025)
Urobilinogen, UA: 0.2 E.U./dL
pH, UA: 6 (ref 5.0–8.0)

## 2018-05-20 LAB — HEPATIC FUNCTION PANEL
ALT: 36 U/L (ref 0–53)
AST: 26 U/L (ref 0–37)
Albumin: 3.9 g/dL (ref 3.5–5.2)
Alkaline Phosphatase: 81 U/L (ref 39–117)
Bilirubin, Direct: 0.1 mg/dL (ref 0.0–0.3)
Total Bilirubin: 0.5 mg/dL (ref 0.2–1.2)
Total Protein: 6.3 g/dL (ref 6.0–8.3)

## 2018-05-20 LAB — LIPID PANEL
Cholesterol: 183 mg/dL (ref 0–200)
HDL: 34 mg/dL — ABNORMAL LOW (ref >39.00)
LDL Cholesterol: 113 mg/dL — ABNORMAL HIGH (ref 0–99)
NonHDL: 149.1
Total CHOL/HDL Ratio: 5
Triglycerides: 180 mg/dL — ABNORMAL HIGH (ref 0.0–149.0)
VLDL: 36 mg/dL (ref 0.0–40.0)

## 2018-05-20 LAB — TSH: TSH: 3.88 u[IU]/mL (ref 0.35–4.50)

## 2018-05-20 LAB — PSA: PSA: 0.31 ng/mL (ref 0.10–4.00)

## 2018-05-20 NOTE — Progress Notes (Signed)
   Subjective:    Patient ID: Bill [name], male    DOB: 01/16/1941, 77 y.o.   MRN: [id number]  HPI Here for a weel exam. He is doing well except for frequent headaches. These seem to be tension headaches and he takes Advil as needed. He is scheduled to see Dr. Tomi Likens for these on 06-28-18. Otherwise he stays busy. He enjoys gardening in the warmer months.    Review of Systems  Constitutional: Negative.   HENT: Negative.   Eyes: Negative.   Respiratory: Negative.   Cardiovascular: Negative.   Gastrointestinal: Negative.   Genitourinary: Negative.   Musculoskeletal: Negative.   Skin: Negative.   Neurological: Negative.   Psychiatric/Behavioral: Negative.        Objective:   Physical Exam  Constitutional: He is oriented to person, place, and time. He appears well-developed and well-nourished. No distress.  HENT:  Head: Normocephalic and atraumatic.  Right Ear: External ear normal.  Left Ear: External ear normal.  Nose: Nose normal.  Mouth/Throat: Oropharynx is clear and moist. No oropharyngeal exudate.  Eyes: Pupils are equal, round, and reactive to light. Conjunctivae and EOM are normal. Right eye exhibits no discharge. Left eye exhibits no discharge. No scleral icterus.  Neck: Neck supple. No JVD present. No tracheal deviation present. No thyromegaly present.  Cardiovascular: Normal rate, regular rhythm, normal heart sounds and intact distal pulses. Exam reveals no gallop and no friction rub.  No murmur heard. Pulmonary/Chest: Effort normal and breath sounds normal. No respiratory distress. He has no wheezes. He has no rales. He exhibits no tenderness.  Abdominal: Soft. Bowel sounds are normal. He exhibits no distension and no mass. There is no tenderness. There is no rebound and no guarding.  Genitourinary: Rectum normal, prostate normal and penis normal. Rectal exam shows guaiac negative stool. No penile tenderness.  Musculoskeletal: Normal range of motion. He  exhibits no edema or tenderness.  Lymphadenopathy:    He has no cervical adenopathy.  Neurological: He is alert and oriented to person, place, and time. He has normal reflexes. He displays normal reflexes. No cranial nerve deficit. He exhibits normal muscle tone. Coordination normal.  Skin: Skin is warm and dry. No rash noted. He is not diaphoretic. No erythema. No pallor.  Psychiatric: He has a normal mood and affect. His behavior is normal. Judgment and thought content normal.          Assessment & Plan:  Well exam. We discussed diet and exercise. Get fasting labs.  Alysia Penna, MD

## 2018-05-23 NOTE — Addendum Note (Signed)
Addended by: Elmer Picker on: 05/23/2018 04:18 PM   Modules accepted: Orders

## 2018-05-24 LAB — BASIC METABOLIC PANEL
BUN: 11 mg/dL (ref 6–23)
CO2: 27 mEq/L (ref 19–32)
Calcium: 8.9 mg/dL (ref 8.4–10.5)
Chloride: 104 mEq/L (ref 96–112)
Creatinine, Ser: 0.91 mg/dL (ref 0.40–1.50)
GFR: 86.16 mL/min (ref >60.00)
Glucose, Bld: 155 mg/dL — ABNORMAL HIGH (ref 70–99)
Potassium: 4 mEq/L (ref 3.5–5.1)
Sodium: 138 mEq/L (ref 135–145)

## 2018-05-27 ENCOUNTER — Ambulatory Visit (INDEPENDENT_AMBULATORY_CARE_PROVIDER_SITE_OTHER): Payer: PPO | Admitting: Neurology

## 2018-05-27 ENCOUNTER — Encounter: Payer: Self-pay | Admitting: Neurology

## 2018-05-27 VITALS — BP 110/64 | HR 64 | Ht 67.0 in | Wt 180.0 lb

## 2018-05-27 DIAGNOSIS — R51 Headache: Secondary | ICD-10-CM | POA: Diagnosis not present

## 2018-05-27 DIAGNOSIS — G4486 Cervicogenic headache: Secondary | ICD-10-CM

## 2018-05-27 NOTE — Patient Instructions (Addendum)
1.  I will refer you to physical therapy for the neck. You have been referred to [facility]. They will call you directly to schedule an appointment.  Please call [phone] if you do not hear from them.  2.  If physical therapy is ineffective, we will start a medication such as gabapentin. If you complete physical therapy before your follow up appt and it is unhelpful, please call our office to initiate medication therapy.  3.  Follow up next available
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
## 2018-06-06 ENCOUNTER — Telehealth: Payer: Self-pay | Admitting: Neurology

## 2018-06-06 NOTE — Telephone Encounter (Signed)
Patient calling regarding Therapy Referral. Please Call. Thanks

## 2018-06-07 NOTE — Telephone Encounter (Signed)
Called and LMOVM for Pt to return my call 

## 2018-06-09 NOTE — Telephone Encounter (Signed)
Pt rtrnd call. Provided him the telephone number to schedule P/T

## 2018-06-24 ENCOUNTER — Encounter: Payer: Self-pay | Admitting: Rehabilitation

## 2018-06-24 ENCOUNTER — Ambulatory Visit: Payer: PPO | Attending: Neurology | Admitting: Rehabilitation

## 2018-06-24 DIAGNOSIS — R293 Abnormal posture: Secondary | ICD-10-CM | POA: Diagnosis not present

## 2018-06-24 DIAGNOSIS — M542 Cervicalgia: Secondary | ICD-10-CM | POA: Insufficient documentation
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
## 2018-06-28 ENCOUNTER — Encounter

## 2018-06-28 ENCOUNTER — Ambulatory Visit: Payer: PPO | Admitting: Neurology

## 2018-07-06 ENCOUNTER — Ambulatory Visit: Payer: PPO | Attending: Neurology | Admitting: Physical Therapy

## 2018-07-06 DIAGNOSIS — M542 Cervicalgia: Secondary | ICD-10-CM | POA: Diagnosis not present

## 2018-07-06 DIAGNOSIS — R293 Abnormal posture: Secondary | ICD-10-CM | POA: Diagnosis not present

## 2018-07-07 ENCOUNTER — Encounter: Payer: Self-pay | Admitting: Physical Therapy

## 2018-07-07 NOTE — Therapy (Addendum)
[place], [place], [address] Phone: [phone]   Fax:  [phone]  Physical Therapy Treatment  Patient Details  Name: Bill [name] MRN: [id number] Date of Birth: 20-May-1941 Referring Provider: Metta Clines, DO   Encounter Date: 07/06/2018  PT End of Session - 07/07/18 0952    Visit Number  2    Number of Visits  13    Date for PT Re-Evaluation  08/23/18    Authorization Type  [employer]    PT Start Time  1330    PT Stop Time  1415    PT Time Calculation (min)  45 min    Activity Tolerance  Patient tolerated treatment well    Behavior During Therapy  [facility] for tasks assessed/performed       Past Medical History:  Diagnosis Date  . Anemia   . Anxiety   . CAD (coronary artery disease) 07/21/2016  . Cardiac arrhythmia   . COPD (chronic obstructive pulmonary disease) ([place])   . Helicobacter pylori gastritis 07/21/2016  . Penicillin allergy 07/21/2016  . Pneumonia   . Skin cancer     Past Surgical History:  Procedure Laterality Date  . APPENDECTOMY    . back fusion    . cataract surgery Left   . CHOLECYSTECTOMY    . COLONOSCOPY  2014   clear   . SPINE SURGERY  1984   thoracic spine fusion     There were no vitals filed for this visit.  Subjective Assessment - 07/07/18 0943    Subjective  Patient report no real change in his headaches. There is no pattern to his headaches. When they come on he reports it is tough to deal with. He has had pain that goes up around his head and into his forehead.     Limitations  House hold activities;Walking    Currently in Pain?  Yes    Pain Score  5     Pain Location  Neck    Pain Orientation  Left;Right    Pain Descriptors / Indicators  Aching;Shooting    Pain Type  Chronic pain    Pain Onset  More than a month ago    Pain Frequency  Intermittent    Aggravating Factors   locking down and turning his head     Pain Relieving Factors  pain  medication,     Effect of Pain on Daily Activities  difficulty turning his head when he is moving the lawn                        [facility] Adult PT Treatment/Exercise - 07/07/18 0001      Self-Care   Self-Care  Other Self-Care Comments      Neck Exercises: Seated   Other Seated Exercise  scap retraction 2x10       Manual Therapy   Manual Therapy  Soft tissue mobilization;Joint mobilization;Manual Traction    Soft tissue mobilization  to upper trap and cerivcal spine     Manual Traction  gentle manual traction and sub-occipital release       Neck Exercises: Stretches   Upper Trapezius Stretch Limitations  2x20 second for the left.     Other Neck Stretches  shoulder extension stretch 2x20 sec holds        Trigger Point Dry Needling - 07/07/18 0912    Consent Given?  Yes    Education Handout Provided  Yes  Muscles Treated Upper Body  Upper trapezius    Upper Trapezius Response  Twitch reponse elicited   2 spots on each side           PT Education - 07/07/18 0949    Education Details  reviewed benefits and risks of TPDN and decreaseing post needle soreness     Person(s) Educated  Patient    Methods  Explanation    Comprehension  Verbalized understanding;Returned demonstration;Verbal cues required;Tactile cues required       PT Short Term Goals - 07/07/18 0958      PT SHORT TERM GOAL #1   Title  Pt will initiate HEP for improved flexibility and decreased pain.  (Target Date: 07/22/18-Date extended by one week to reflect delay in start)    Time  3    Period  Weeks    Status  On-going      PT SHORT TERM GOAL #2   Title  Pt will report 25% decrease in headaches during the day in order to increase pts ability to participate in leisure activities.      Baseline  no change     Time  3    Period  Weeks    Status  On-going      PT SHORT TERM GOAL #3   Title  Pt to complete NDI and appropriate LTG to be set.     Baseline  unable 2nd to time restrictions      Time  3    Period  Weeks    Status  On-going      PT SHORT TERM GOAL #4   Title  Pt will improve cervical rotation (B) and flex/ext by 10 deg in order to indicate improved spinal mobility and improved ability to perform ADLs.     Time  3    Period  Weeks    Status  On-going        PT Long Term Goals - 06/24/18 1519      PT LONG TERM GOAL #1   Title  Pt will be independent with final HEP in order to indicate improved ROM, flexibility and decreased pain.  (Target Date: 08/15/18-Date extended to reflect 1 week delay in start of care)     Time  6    Period  Weeks    Status  New    Target Date  08/15/18      PT LONG TERM GOAL #2   Title  Pt will report 50% decrease in headaches in order to increase pts ability to participate in leisure activities.     Time  6    Period  Weeks    Status  New      PT LONG TERM GOAL #3   Title  Pt will increase cervical rotation (B), flex, ext by 15 deg and side bend (B) by 5 deg in order to indicate improved mobility and ability to carry out ADLs.     Time  6    Status  New            Plan - 07/07/18 1962    Clinical Impression Statement  Patient had a good twitch response with needling more so on the left. He reported no pain or soreness with the needling despite agreat twtich response. Theray reviewed stretching and self soft tissue mobilization to prvent post needle soreness. Therapy alos  reviewed refferal patterns from the neck on trigger [email or web address].[email or web address] He was able to see that his headache patterns  are the same that come along with trigger points from the upper trap.     Clinical Presentation  Stable    Clinical Decision Making  Low    Rehab Potential  Good    Clinical Impairments Affecting Rehab Potential  length of time since injury    PT Frequency  2x / week    PT Duration  6 weeks    PT Treatment/Interventions  ADLs/Self Care Home Management;Electrical Stimulation;Moist Heat;Ultrasound;DME Instruction;Functional mobility  training;Therapeutic activities;Therapeutic exercise;Neuromuscular re-education;Patient/family education;Manual techniques;Passive range of motion;Dry needling;Taping    PT Next Visit Plan  TPDN to L paracervical muscles, HEP for improved flexibility and postural control, Shanon Brow can you have him fill out the NDI?    Consulted and Agree with Plan of Care  Patient;Family member/caregiver    Family Member Consulted  significant other       Patient will benefit from skilled therapeutic intervention in order to improve the following deficits and impairments:  Decreased mobility, Decreased range of motion, Hypomobility, Impaired flexibility, Impaired perceived functional ability, Postural dysfunction, Improper body mechanics  Visit Diagnosis: Cervicalgia  Abnormal posture     Problem List Patient Active Problem List   Diagnosis Date Noted  . Frequent headaches 04/14/2018  . COPD (chronic obstructive pulmonary disease) with emphysema ([place]) 12/14/2017  . Helicobacter pylori gastritis 07/21/2016  . CAD (coronary artery disease) 07/21/2016    Carney Living PT DPT  07/07/2018, 1:10 PM  [facility] [address] [place], [place], [address] Phone: [phone]   Fax:  [phone]  Name: Flay Ghosh MRN: [id number] Date of Birth: December 15, 1940

## 2018-07-08 ENCOUNTER — Ambulatory Visit: Payer: PPO | Admitting: Physical Therapy

## 2018-07-08 ENCOUNTER — Encounter: Payer: Self-pay | Admitting: Physical Therapy

## 2018-07-08 DIAGNOSIS — M542 Cervicalgia: Secondary | ICD-10-CM | POA: Diagnosis not present

## 2018-07-08 DIAGNOSIS — R293 Abnormal posture: Secondary | ICD-10-CM

## 2018-07-08 NOTE — Therapy (Signed)
[facility] [address] [place], [place], [address] Phone: [phone]   Fax:  [phone]  Physical Therapy Treatment  Patient Details  Name: Bill [name] MRN: [id number] Date of Birth: 02-21-1941 Referring Provider: Metta Clines, DO   Encounter Date: 07/08/2018  PT End of Session - 07/08/18 1022    Visit Number  3    Number of Visits  13    Date for PT Re-Evaluation  08/23/18    Authorization Type  [employer]    PT Start Time  1017    PT Stop Time  1059    PT Time Calculation (min)  42 min    Activity Tolerance  Patient tolerated treatment well    Behavior During Therapy  [facility] for tasks assessed/performed       Past Medical History:  Diagnosis Date  . Anemia   . Anxiety   . CAD (coronary artery disease) 07/21/2016  . Cardiac arrhythmia   . COPD (chronic obstructive pulmonary disease) ([place])   . Helicobacter pylori gastritis 07/21/2016  . Penicillin allergy 07/21/2016  . Pneumonia   . Skin cancer     Past Surgical History:  Procedure Laterality Date  . APPENDECTOMY    . back fusion    . cataract surgery Left   . CHOLECYSTECTOMY    . COLONOSCOPY  2014   clear   . SPINE SURGERY  1984   thoracic spine fusion     There were no vitals filed for this visit.  Subjective Assessment - 07/08/18 1020    Subjective  not feeling a big change since DN treatment -feels like he has a "high pain tolerance"    Patient is accompained by:  Family member    Limitations  House hold activities;Walking    Patient Stated Goals  "to get rid of this headache."     Currently in Pain?  No/denies   "it comes and goes"                      [facility] Adult PT Treatment/Exercise - 07/08/18 1046      Exercises   Exercises  Neck      Neck Exercises: Theraband   Rows  10 reps;Red    Shoulder External Rotation  10 reps;Red    Shoulder External Rotation Limitations  + scap squeeze    Horizontal  ABduction  10 reps;Red    Horizontal ABduction Limitations  + scap squeeze      Neck Exercises: Seated   Neck Retraction  15 reps;5 secs    Neck Retraction Limitations  VC for form      Manual Therapy   Manual Therapy  Soft tissue mobilization;Myofascial release;Passive ROM    Manual therapy comments  patient supine    Soft tissue mobilization  STM to B UT, B cervical paraspinals, B LS    Myofascial Release  manual trigger point release to L UT, suboccipital release 3 x 20 sec    Passive ROM  PROM into R sidebending and R rotation    Manual Traction  gentle manual traction      Neck Exercises: Stretches   Upper Trapezius Stretch  Left;3 reps;30 seconds    Upper Trapezius Stretch Limitations  PT led in supine    Neck Stretch  3 reps;30 seconds    Neck Stretch Limitations  cervical rotation - PT led in supine    Corner Stretch  3 reps;30 seconds    Corner  Stretch Limitations  doorway - mid pec stretch             PT Education - 07/08/18 1145    Education Details  discussion on continued benefit of DN treatment due to noted palpable tissue tightness and trigger points    Person(s) Educated  Patient    Methods  Explanation    Comprehension  Verbalized understanding       PT Short Term Goals - 07/07/18 0958      PT SHORT TERM GOAL #1   Title  Pt will initiate HEP for improved flexibility and decreased pain.  (Target Date: 07/22/18-Date extended by one week to reflect delay in start)    Time  3    Period  Weeks    Status  On-going      PT SHORT TERM GOAL #2   Title  Pt will report 25% decrease in headaches during the day in order to increase pts ability to participate in leisure activities.      Baseline  no change     Time  3    Period  Weeks    Status  On-going      PT SHORT TERM GOAL #3   Title  Pt to complete NDI and appropriate LTG to be set.     Baseline  unable 2nd to time restrictions     Time  3    Period  Weeks    Status  On-going      PT SHORT TERM  GOAL #4   Title  Pt will improve cervical rotation (B) and flex/ext by 10 deg in order to indicate improved spinal mobility and improved ability to perform ADLs.     Time  3    Period  Weeks    Status  On-going        PT Long Term Goals - 06/24/18 1519      PT LONG TERM GOAL #1   Title  Pt will be independent with final HEP in order to indicate improved ROM, flexibility and decreased pain.  (Target Date: 08/15/18-Date extended to reflect 1 week delay in start of care)     Time  6    Period  Weeks    Status  New    Target Date  08/15/18      PT LONG TERM GOAL #2   Title  Pt will report 50% decrease in headaches in order to increase pts ability to participate in leisure activities.     Time  6    Period  Weeks    Status  New      PT LONG TERM GOAL #3   Title  Pt will increase cervical rotation (B), flex, ext by 15 deg and side bend (B) by 5 deg in order to indicate improved mobility and ability to carry out ADLs.     Time  6    Status  New            Plan - 07/08/18 1022    Clinical Impression Statement  Patient reporting continuedintermittent headaches and neck pain. Did well with DN session per chart review, however patient reporting little change - education with patient that this may take several treatments to feel full effect from DN. PT session today focusing on manual therapy to neck and upper back as well as strengthening postural and periscapular musculature. Updated HEP to include these activities. Verbal cues throughout for upright posturing.     Rehab Potential  Good    Clinical Impairments Affecting Rehab Potential  length of time since injury    PT Frequency  2x / week    PT Duration  6 weeks    PT Treatment/Interventions  ADLs/Self Care Home Management;Electrical Stimulation;Moist Heat;Ultrasound;DME Instruction;Functional mobility training;Therapeutic activities;Therapeutic exercise;Neuromuscular re-education;Patient/family education;Manual techniques;Passive  range of motion;Dry needling;Taping    Consulted and Agree with Plan of Care  Patient;Family member/caregiver    Family Member Consulted  significant other       Patient will benefit from skilled therapeutic intervention in order to improve the following deficits and impairments:  Decreased mobility, Decreased range of motion, Hypomobility, Impaired flexibility, Impaired perceived functional ability, Postural dysfunction, Improper body mechanics  Visit Diagnosis: Cervicalgia  Abnormal posture     Problem List Patient Active Problem List   Diagnosis Date Noted  . Frequent headaches 04/14/2018  . COPD (chronic obstructive pulmonary disease) with emphysema ([place]) 12/14/2017  . Helicobacter pylori gastritis 07/21/2016  . CAD (coronary artery disease) 07/21/2016    Lanney Gins, PT, DPT 07/08/18 11:51 AM Pager: [phone]   [facility] [address] [place], [place], [address] Phone: [phone]   Fax:  [phone]  Name: Treon Kehl MRN: [id number] Date of Birth: 11-May-1941

## 2018-07-12 ENCOUNTER — Ambulatory Visit: Payer: PPO | Admitting: Physical Therapy

## 2018-07-12 ENCOUNTER — Encounter: Payer: Self-pay | Admitting: Physical Therapy

## 2018-07-12 DIAGNOSIS — M542 Cervicalgia: Secondary | ICD-10-CM

## 2018-07-12 DIAGNOSIS — R293 Abnormal posture: Secondary | ICD-10-CM

## 2018-07-12 NOTE — Therapy (Signed)
[facility] [address] [place], [place], [address] Phone: [phone]   Fax:  [phone]  Physical Therapy Treatment  Patient Details  Name: Bill [name] MRN: [id number] Date of Birth: 1941/05/28 Referring Provider: Metta Clines, DO   Encounter Date: 07/12/2018  PT End of Session - 07/12/18 1220    Visit Number  4    Number of Visits  13    Date for PT Re-Evaluation  08/23/18    Authorization Type  [employer]    PT Start Time  1103    PT Stop Time  1146    PT Time Calculation (min)  43 min    Activity Tolerance  Patient tolerated treatment well    Behavior During Therapy  [facility] for tasks assessed/performed       Past Medical History:  Diagnosis Date  . Anemia   . Anxiety   . CAD (coronary artery disease) 07/21/2016  . Cardiac arrhythmia   . COPD (chronic obstructive pulmonary disease) ([place])   . Helicobacter pylori gastritis 07/21/2016  . Penicillin allergy 07/21/2016  . Pneumonia   . Skin cancer     Past Surgical History:  Procedure Laterality Date  . APPENDECTOMY    . back fusion    . cataract surgery Left   . CHOLECYSTECTOMY    . COLONOSCOPY  2014   clear   . SPINE SURGERY  1984   thoracic spine fusion     There were no vitals filed for this visit.  Subjective Assessment - 07/12/18 1109    Subjective  feels about the same following last visit - some shoulder pain due to band work;    [employer] hold activities;Walking    Patient Stated Goals  "to get rid of this headache."     Currently in Pain?  Yes    Pain Score  4     Pain Location  Neck    Pain Orientation  Right;Left    Pain Descriptors / Indicators  Aching;Shooting    Pain Type  Chronic pain                       [facility] Adult PT Treatment/Exercise - 07/12/18 0001      Exercises   Exercises  Neck      Neck Exercises: Theraband   Shoulder Extension  10 reps;Green    Shoulder Extension Limitations   + scap squeeze    Rows  10 reps;Green      Neck Exercises: Standing   Neck Retraction  10 reps;5 secs    Neck Retraction Limitations  VC for form    Wall Push Ups  10 reps    Wall Push Ups Limitations  with push up plus for imporved scapular mobility      Neck Exercises: Seated   Shoulder Rolls  Backwards;Forwards;10 reps    Other Seated Exercise  scap retraction x 12      Manual Therapy   Manual Therapy  Soft tissue mobilization;Myofascial release;Passive ROM    Manual therapy comments  patient supine; skilled palpation with DN treatment    Soft tissue mobilization  STM to B UT, B cervical paraspinals, B LS    Myofascial Release  manual trigger point release to L UT, suboccipital release 2 x 20 sec    Passive ROM  PROM into L UT stretch    Manual Traction  gentle manual traction       Trigger Point  Dry Needling - 07/12/18 1218    Consent Given?  Yes    Muscles Treated Upper Body  Upper trapezius   L only - proximal and distal    Upper Trapezius Response  Twitch reponse elicited;Palpable increased muscle length           PT Education - 07/12/18 1219    Education Details  DN technique - benefits and justification for treatment;    Person(s) Educated  Patient    Methods  Explanation    Comprehension  Verbalized understanding       PT Short Term Goals - 07/07/18 0958      PT SHORT TERM GOAL #1   Title  Pt will initiate HEP for improved flexibility and decreased pain.  (Target Date: 07/22/18-Date extended by one week to reflect delay in start)    Time  3    Period  Weeks    Status  On-going      PT SHORT TERM GOAL #2   Title  Pt will report 25% decrease in headaches during the day in order to increase pts ability to participate in leisure activities.      Baseline  no change     Time  3    Period  Weeks    Status  On-going      PT SHORT TERM GOAL #3   Title  Pt to complete NDI and appropriate LTG to be set.     Baseline  unable 2nd to time restrictions      Time  3    Period  Weeks    Status  On-going      PT SHORT TERM GOAL #4   Title  Pt will improve cervical rotation (B) and flex/ext by 10 deg in order to indicate improved spinal mobility and improved ability to perform ADLs.     Time  3    Period  Weeks    Status  On-going        PT Long Term Goals - 06/24/18 1519      PT LONG TERM GOAL #1   Title  Pt will be independent with final HEP in order to indicate improved ROM, flexibility and decreased pain.  (Target Date: 08/15/18-Date extended to reflect 1 week delay in start of care)     Time  6    Period  Weeks    Status  New    Target Date  08/15/18      PT LONG TERM GOAL #2   Title  Pt will report 50% decrease in headaches in order to increase pts ability to participate in leisure activities.     Time  6    Period  Weeks    Status  New      PT LONG TERM GOAL #3   Title  Pt will increase cervical rotation (B), flex, ext by 15 deg and side bend (B) by 5 deg in order to indicate improved mobility and ability to carry out ADLs.     Time  6    Status  New            Plan - 07/12/18 1220    Clinical Impression Statement  Patient today reporting continued neck pain - slight change in intensity noted as well as continued intermittent shooting pains into temple. PT and patient discussing further DN treatment this session with patient agreeable. DN focused on L UT region as he reports this as primary area of concern. Excellent twitch  response noted with patient verbalizing ability to feel muscle twitch as compared to first DN session. Continued manual therapy as well as postural and periscapular strengthening following for full carryover of treatment. Will continue to progress towards goals.     Rehab Potential  Good    Clinical Impairments Affecting Rehab Potential  length of time since injury    PT Frequency  2x / week    PT Duration  6 weeks    PT Treatment/Interventions  ADLs/Self Care Home Management;Electrical  Stimulation;Moist Heat;Ultrasound;DME Instruction;Functional mobility training;Therapeutic activities;Therapeutic exercise;Neuromuscular re-education;Patient/family education;Manual techniques;Passive range of motion;Dry needling;Taping    PT Next Visit Plan  TPDN to L paracervical muscles, HEP for improved flexibility and postural control. NDI assessment    Consulted and Agree with Plan of Care  Patient       Patient will benefit from skilled therapeutic intervention in order to improve the following deficits and impairments:  Decreased mobility, Decreased range of motion, Hypomobility, Impaired flexibility, Impaired perceived functional ability, Postural dysfunction, Improper body mechanics  Visit Diagnosis: Cervicalgia  Abnormal posture     Problem List Patient Active Problem List   Diagnosis Date Noted  . Frequent headaches 04/14/2018  . COPD (chronic obstructive pulmonary disease) with emphysema ([place]) 12/14/2017  . Helicobacter pylori gastritis 07/21/2016  . CAD (coronary artery disease) 07/21/2016     Lanney Gins, PT, DPT Supplemental Physical Therapist 07/12/18 12:27 PM Pager: [phone] Office: [place] [facility] [address] [place], [place], [address] Phone: [phone]   Fax:  [phone]  Name: Bill [name] MRN: [id number] Date of Birth: Oct 17, 1941

## 2018-07-15 ENCOUNTER — Ambulatory Visit: Payer: PPO | Admitting: Rehabilitation

## 2018-07-18 ENCOUNTER — Ambulatory Visit: Payer: PPO | Admitting: Rehabilitation

## 2018-07-18 ENCOUNTER — Encounter: Payer: Self-pay | Admitting: Rehabilitation

## 2018-07-18 DIAGNOSIS — M542 Cervicalgia: Secondary | ICD-10-CM | POA: Diagnosis not present

## 2018-07-18 DIAGNOSIS — R293 Abnormal posture: Secondary | ICD-10-CM

## 2018-07-18 NOTE — Patient Instructions (Signed)
EXTENSION: Supine - Elbow Extended (Isometric)    Lie on back, right arm at side on surface. Press arm down into surface with elbow straight. Hold _5__ seconds. Complete _2__ sets of _10__ repetitions. Perform _1-2__ sessions per day.  Copyright  [employer]. All rights reserved.     TRUNK EXTENSION - TOWEL - AROM - MOBILIZATION  While sitting in a chair, extend your thoracic spine backwards over a rolled up towel against the back rest.   Do a chin tuck, do NOT extend neck and look up.  Hold for 10-15 secs.  Do 3-4 times per day.       Suboccipital Release with balls  Take 2 tennis balls (can also try other balls) and tape them together, or alternatively place in a pillow-case or sock.  Laying on your back on a firm surface (does not work as well in bed), place the two tennis balls at the base of your skull right where the bone of your skull meets the muscles. Relax your head over the balls, your head should be in a "chin-tuck" position as opposed to extended backwards. You should be able to relax your head and not have to work at maintaining your head in that position, if the balls feel like they're sliding down just place the balls slightly higher.   This activity can work wonders for people with classic tension headache issues as well as a variety of other cervical ailments.   Lie on tennis balls 2 mins at a time 2-3 times per day.

## 2018-07-18 NOTE — Therapy (Signed)
[facility] [address] [place], [place], [address] Phone: [phone]   Fax:  [phone]  Physical Therapy Treatment  Patient Details  Name: Bill [name] MRN: [id number] Date of Birth: 1940/11/07 Referring Provider: Metta Clines, DO   Encounter Date: 07/18/2018  PT End of Session - 07/18/18 1532    Visit Number  5    Number of Visits  13    Date for PT Re-Evaluation  08/23/18    Authorization Type  [employer]    PT Start Time  1148    PT Stop Time  1235    PT Time Calculation (min)  47 min    Activity Tolerance  Patient tolerated treatment well    Behavior During Therapy  [facility] for tasks assessed/performed       Past Medical History:  Diagnosis Date  . Anemia   . Anxiety   . CAD (coronary artery disease) 07/21/2016  . Cardiac arrhythmia   . COPD (chronic obstructive pulmonary disease) ([place])   . Helicobacter pylori gastritis 07/21/2016  . Penicillin allergy 07/21/2016  . Pneumonia   . Skin cancer     Past Surgical History:  Procedure Laterality Date  . APPENDECTOMY    . back fusion    . cataract surgery Left   . CHOLECYSTECTOMY    . COLONOSCOPY  2014   clear   . SPINE SURGERY  1984   thoracic spine fusion     There were no vitals filed for this visit.  Subjective Assessment - 07/18/18 1512    Subjective  Pt reports that he feels needling may have increased his pain and headaches since last session.  He also continues to report R shoulder pain due to trying to crank weedeater a couple of weeks ago.     Limitations  House hold activities;Walking    Patient Stated Goals  "to get rid of this headache."     Currently in Pain?  Yes    Pain Score  5     Pain Location  Knee    Pain Orientation  Right;Left    Pain Descriptors / Indicators  Aching;Shooting    Pain Type  Chronic pain    Pain Radiating Towards  into head causing headaches    Pain Onset  More than a month ago    Pain Frequency   Intermittent    Aggravating Factors   looking down and turning head    Pain Relieving Factors  pain medication                        [facility] Adult PT Treatment/Exercise - 07/18/18 1149      Self-Care   Self-Care  Posture    Posture  Pt very communicative during session but did demonstrate to PT a few exercises that he does.  He demonstrated trunk extension but with overt shoulder elevation and a few other exercises in which he "stretches" but continues to demonstrate shoulder elevation.  PT educated on impotance of postural awareness during ALL mobility.  Pt also reports increased pain and beginning of headache when turning during driving/pulling trailer.  PT educated to turn within pain free range and then may have to compensate with trunk rotation until he is able to get more pain free range of motion.  Also educated on letting PT know how treatment/exercises are effecting pain/headaches.  Such as frequency of headaches/duration, etc.  Pt verbalized understanding.  Exercises   Exercises  Neck      Neck Exercises: Seated   Other Seated Exercise  Seated thoracic extension over hard back chair with towel roll to improve spinal mobility x 5 reps with 5 sec holds      Neck Exercises: Supine   Neck Retraction  10 reps;5 secs    Other Supine Exercise  Supine shoulder isometric shoulder extension x 10 reps      Manual Therapy   Manual Therapy  Joint mobilization;Soft tissue mobilization;Myofascial release;Manual Traction    Manual therapy comments  Manual therapy to decrease pain and improve joint mobility, ROM and flexibility.    Joint Mobilization  Supine Grade II lateral glide mobilizations to improve cervical rotation throughout entire cervical spine.  Prone Grade II/III PA mobs to upper and lower cervical spine as well as upper thoracic spine x 2 reps    Soft tissue mobilization  STM to B UT, B cervical paraspinals, B LS    Myofascial Release  trigger point release to L  UT, suboccipital release with use of tennis ball-provided for HEP    Manual Traction  gentle manual traction          EXTENSION: Supine - Elbow Extended (Isometric)    Lie on back, right arm at side on surface. Press arm down into surface with elbow straight. Hold _5__ seconds. Complete _2__ sets of _10__ repetitions. Perform _1-2__ sessions per day.  Copyright  VHI. All rights reserved.     TRUNK EXTENSION - TOWEL - AROM - MOBILIZATION  While sitting in a chair, extend your thoracic spine backwards over a rolled up towel against the back rest.   Do a chin tuck, do NOT extend neck and look up.  Hold for 10-15 secs.  Do 3-4 times per day.       Suboccipital Release with balls  Take 2 tennis balls (can also try other balls) and tape them together, or alternatively place in a pillow-case or sock.  Laying on your back on a firm surface (does not work as well in bed), place the two tennis balls at the base of your skull right where the bone of your skull meets the muscles. Relax your head over the balls, your head should be in a "chin-tuck" position as opposed to extended backwards. You should be able to relax your head and not have to work at maintaining your head in that position, if the balls feel like they're sliding down just place the balls slightly higher.   This activity can work wonders for people with classic tension headache issues as well as a variety of other cervical ailments.   Lie on tennis balls 2 mins at a time 2-3 times per day.       PT Education - 07/18/18 1531    Education Details  see self care, additions to HEP    Person(s) Educated  Patient    Methods  Explanation;Demonstration;Handout    Comprehension  Verbalized understanding;Returned demonstration       PT Short Term Goals - 07/07/18 0958      PT SHORT TERM GOAL #1   Title  Pt will initiate HEP for improved flexibility and decreased pain.  (Target Date: 07/22/18-Date extended by one week to  reflect delay in start)    Time  3    Period  Weeks    Status  On-going      PT SHORT TERM GOAL #2   Title  Pt will report 25%  decrease in headaches during the day in order to increase pts ability to participate in leisure activities.      Baseline  no change     Time  3    Period  Weeks    Status  On-going      PT SHORT TERM GOAL #3   Title  Pt to complete NDI and appropriate LTG to be set.     Baseline  unable 2nd to time restrictions     Time  3    Period  Weeks    Status  On-going      PT SHORT TERM GOAL #4   Title  Pt will improve cervical rotation (B) and flex/ext by 10 deg in order to indicate improved spinal mobility and improved ability to perform ADLs.     Time  3    Period  Weeks    Status  On-going        PT Long Term Goals - 06/24/18 1519      PT LONG TERM GOAL #1   Title  Pt will be independent with final HEP in order to indicate improved ROM, flexibility and decreased pain.  (Target Date: 08/15/18-Date extended to reflect 1 week delay in start of care)     Time  6    Period  Weeks    Status  New    Target Date  08/15/18      PT LONG TERM GOAL #2   Title  Pt will report 50% decrease in headaches in order to increase pts ability to participate in leisure activities.     Time  6    Period  Weeks    Status  New      PT LONG TERM GOAL #3   Title  Pt will increase cervical rotation (B), flex, ext by 15 deg and side bend (B) by 5 deg in order to indicate improved mobility and ability to carry out ADLs.     Time  6    Status  New            Plan - 07/18/18 1532    Clinical Impression Statement  Pt reporting he felt needling increased neck pain and headaches, but does feel that some of the tightness is resolving.  Provided education to give [facility] this information for best care.  Continue to focus on manual therapy to improve joint mobility, flexibility, and decreased pain along with exercise to carryover to improved postures and decreased pain.       Rehab Potential  Good    Clinical Impairments Affecting Rehab Potential  length of time since injury    PT Frequency  2x / week    PT Duration  6 weeks    PT Treatment/Interventions  ADLs/Self Care Home Management;Electrical Stimulation;Moist Heat;Ultrasound;DME Instruction;Functional mobility training;Therapeutic activities;Therapeutic exercise;Neuromuscular re-education;Patient/family education;Manual techniques;Passive range of motion;Dry needling;Taping    PT Next Visit Plan  CHECK STGS!!!, Get feedback on how he is tolerating dry needling-he reports it felt like it increased pain, TPDN to L paracervical muscles, HEP for improved flexibility and postural control. NDI assessment    Consulted and Agree with Plan of Care  Patient       Patient will benefit from skilled therapeutic intervention in order to improve the following deficits and impairments:  Decreased mobility, Decreased range of motion, Hypomobility, Impaired flexibility, Impaired perceived functional ability, Postural dysfunction, Improper body mechanics  Visit Diagnosis: Cervicalgia  Abnormal posture     Problem List  Patient Active Problem List   Diagnosis Date Noted  . Frequent headaches 04/14/2018  . COPD (chronic obstructive pulmonary disease) with emphysema ([place]) 12/14/2017  . Helicobacter pylori gastritis 07/21/2016  . CAD (coronary artery disease) 07/21/2016    Cameron Sprang, PT, MPT [facility] [address] [place], [place], [address] Phone: [phone]   Fax:  [phone] 07/18/18, 3:35 PM  Name: Ashwath Lasch MRN: [id number] Date of Birth: 04-Nov-1940

## 2018-07-19 ENCOUNTER — Ambulatory Visit: Payer: PPO | Admitting: *Deleted

## 2018-07-19 ENCOUNTER — Ambulatory Visit: Payer: PPO | Admitting: Physical Therapy

## 2018-07-25 ENCOUNTER — Ambulatory Visit: Payer: PPO | Admitting: Physical Therapy

## 2018-07-25 DIAGNOSIS — M542 Cervicalgia: Secondary | ICD-10-CM

## 2018-07-25 DIAGNOSIS — R293 Abnormal posture: Secondary | ICD-10-CM

## 2018-07-26 ENCOUNTER — Encounter: Payer: Self-pay | Admitting: Physical Therapy

## 2018-07-26 NOTE — Therapy (Signed)
[facility] [place], [place], [address] Phone: [phone]   Fax:  [phone]  Physical Therapy Treatment  Patient Details  Name: Bill [name] MRN: [id number] Date of Birth: 1941/02/17 Referring Provider (PT): Metta Clines, DO   Encounter Date: 07/25/2018  PT End of Session - 07/26/18 0907    Visit Number  6    Number of Visits  13    Date for PT Re-Evaluation  08/23/18    Authorization Type  [employer]    PT Start Time  1330    PT Stop Time  1413    PT Time Calculation (min)  43 min    Activity Tolerance  Patient tolerated treatment well    Behavior During Therapy  [facility] for tasks assessed/performed       Past Medical History:  Diagnosis Date  . Anemia   . Anxiety   . CAD (coronary artery disease) 07/21/2016  . Cardiac arrhythmia   . COPD (chronic obstructive pulmonary disease) ([place])   . Helicobacter pylori gastritis 07/21/2016  . Penicillin allergy 07/21/2016  . Pneumonia   . Skin cancer     Past Surgical History:  Procedure Laterality Date  . APPENDECTOMY    . back fusion    . cataract surgery Left   . CHOLECYSTECTOMY    . COLONOSCOPY  2014   clear   . SPINE SURGERY  1984   thoracic spine fusion     There were no vitals filed for this visit.  Subjective Assessment - 07/25/18 1459    Subjective  Pt reports he continues to have multiple HA throughout the day and does not feel HA or shoulder pain has improved with recent DN sessions.     Limitations  Standing    Patient Stated Goals  "to get rid of this headache."     Currently in Pain?  No/denies   no headahce coming in    Pain Orientation  Right;Left    Pain Descriptors / Indicators  Aching;Shooting    Pain Type  Chronic pain    Pain Radiating Towards  radiating into head and around his eaye     Pain Onset  More than a month ago    Pain Frequency  Intermittent    Aggravating Factors   looking down and truning head     Pain  Relieving Factors  pain medication     Effect of Pain on Daily Activities  difficulty turning his head                        [facility] Adult PT Treatment/Exercise - 07/26/18 0001      Self-Care   Self-Care  Posture;Other Self-Care Comments    Posture  reviewed proper sitting posture    Other Self-Care Comments   reviewed exercise progression with patient. Patient is having pain in his right shoulder. patient shown how to do exercises wih his left shoulder only and his neck stretches on the left side.       Neck Exercises: Seated   Other Seated Exercise  chin tucks with scap reracton x10;       Manual Therapy   Manual Therapy  Soft tissue mobilization    Soft tissue mobilization  STM to bil suboccipitals; cervical perispinals; R UT     Myofascial Release  trigger point release to L UT, suboccipital release with use of tennis ball-provided for HEP    Manual Traction  gentle  manual traction      Neck Exercises: Stretches   Upper Trapezius Stretch  Left;3 reps;30 seconds       Trigger Point Dry Needling - 07/26/18 0911    Consent Given?  Yes    Muscles Treated Upper Body  Suboccipitals muscle group    SubOccipitals Response  Twitch response elicited   bilateral            PT Short Term Goals - 07/07/18 0958      PT SHORT TERM GOAL #1   Title  Pt will initiate HEP for improved flexibility and decreased pain.  (Target Date: 07/22/18-Date extended by one week to reflect delay in start)    Time  3    Period  Weeks    Status  On-going      PT SHORT TERM GOAL #2   Title  Pt will report 25% decrease in headaches during the day in order to increase pts ability to participate in leisure activities.      Baseline  no change     Time  3    Period  Weeks    Status  On-going      PT SHORT TERM GOAL #3   Title  Pt to complete NDI and appropriate LTG to be set.     Baseline  unable 2nd to time restrictions     Time  3    Period  Weeks    Status  On-going      PT  SHORT TERM GOAL #4   Title  Pt will improve cervical rotation (B) and flex/ext by 10 deg in order to indicate improved spinal mobility and improved ability to perform ADLs.     Time  3    Period  Weeks    Status  On-going        PT Long Term Goals - 06/24/18 1519      PT LONG TERM GOAL #1   Title  Pt will be independent with final HEP in order to indicate improved ROM, flexibility and decreased pain.  (Target Date: 08/15/18-Date extended to reflect 1 week delay in start of care)     Time  6    Period  Weeks    Status  New    Target Date  08/15/18      PT LONG TERM GOAL #2   Title  Pt will report 50% decrease in headaches in order to increase pts ability to participate in leisure activities.     Time  6    Period  Weeks    Status  New      PT LONG TERM GOAL #3   Title  Pt will increase cervical rotation (B), flex, ext by 15 deg and side bend (B) by 5 deg in order to indicate improved mobility and ability to carry out ADLs.     Time  6    Status  New            Plan - 07/25/18 1508    Clinical Impression Statement  PTPDN performed on bil suboccipitals, with no reports of  pain. Pt educated  on trigger point referral patterns and possibility of HAs from suboccipitals. time spent educating the patient on the improtance of postrue and the roll of ther-ex with posture. Patient will likley require further education He was able to demostrate upper trap stretch and chin tucks properly. He reports pain in his right shoulder with scpaualr exercises. He was advised to just  do it on the left for now.     Clinical Presentation  Stable    Clinical Decision Making  Low    Rehab Potential  Good    Clinical Impairments Affecting Rehab Potential  length of time since injury    PT Frequency  2x / week    PT Duration  6 weeks    PT Treatment/Interventions  ADLs/Self Care Home Management;Electrical Stimulation;Moist Heat;Ultrasound;DME Instruction;Functional mobility training;Therapeutic  activities;Therapeutic exercise;Neuromuscular re-education;Patient/family education;Manual techniques;Passive range of motion;Dry needling;Taping    PT Next Visit Plan  If patient has benefit from needling continue with needling of sub-occipitals; continue with therapy if patient has any benefit.     Consulted and Agree with Plan of Care  Patient    Family Member Consulted  significant other       Patient will benefit from skilled therapeutic intervention in order to improve the following deficits and impairments:  Decreased mobility, Decreased range of motion, Hypomobility, Impaired flexibility, Impaired perceived functional ability, Postural dysfunction, Improper body mechanics  Visit Diagnosis: Cervicalgia  Abnormal posture     Problem List Patient Active Problem List   Diagnosis Date Noted  . Frequent headaches 04/14/2018  . COPD (chronic obstructive pulmonary disease) with emphysema ([place]) 12/14/2017  . Helicobacter pylori gastritis 07/21/2016  . CAD (coronary artery disease) 07/21/2016    Carney Living PT DPT  07/26/2018, 9:14 AM  [facility] [address] [place], [place], [address] Phone: [phone]   Fax:  [phone]  Name: Guerry Covington MRN: [id number] Date of Birth: 10/08/1941

## 2018-07-29 ENCOUNTER — Ambulatory Visit: Payer: PPO | Attending: Neurology | Admitting: Rehabilitation

## 2018-07-29 ENCOUNTER — Encounter: Payer: Self-pay | Admitting: Rehabilitation

## 2018-07-29 DIAGNOSIS — R293 Abnormal posture: Secondary | ICD-10-CM | POA: Diagnosis not present

## 2018-07-29 DIAGNOSIS — M542 Cervicalgia: Secondary | ICD-10-CM | POA: Diagnosis not present

## 2018-07-29 NOTE — Patient Instructions (Signed)
Access Code: [email or web address]  URL: [email or web address]  Date: 07/29/2018  Prepared by: Cameron Sprang   Exercises  Seated Assisted Cervical Rotation with Towel - 5 reps - 1 sets - 5-7 hold - 1x daily - 7x weekly

## 2018-07-29 NOTE — Therapy (Signed)
[facility] [address] [place], [place], [address] Phone: [phone]   Fax:  [phone]  Physical Therapy Treatment  Patient Details  Name: Bill [name] MRN: [id number] Date of Birth: 1941/03/01 Referring Provider (PT): Metta Clines, DO   Encounter Date: 07/29/2018  PT End of Session - 07/29/18 1313    Visit Number  7    Number of Visits  13    Date for PT Re-Evaluation  08/23/18    Authorization Type  [employer]    PT Start Time  1102    PT Stop Time  1145    PT Time Calculation (min)  43 min    Activity Tolerance  Patient tolerated treatment well    Behavior During Therapy   [facility] for tasks assessed/performed       Past Medical History:  Diagnosis Date  . Anemia   . Anxiety   . CAD (coronary artery disease) 07/21/2016  . Cardiac arrhythmia   . COPD (chronic obstructive pulmonary disease) ([place])   . Helicobacter pylori gastritis 07/21/2016  . Penicillin allergy 07/21/2016  . Pneumonia   . Skin cancer     Past Surgical History:  Procedure Laterality Date  . APPENDECTOMY    . back fusion    . cataract surgery Left   . CHOLECYSTECTOMY    . COLONOSCOPY  2014   clear   . SPINE SURGERY  1984   thoracic spine fusion     There were no vitals filed for this visit.  Subjective Assessment - 07/29/18 1109    Subjective  Pt reports he has had a lot going on at home.  Reports pain following last DN appt, but hasn't really had time to think about how pain has been since then.     Limitations  Standing    Patient Stated Goals  "to get rid of this headache."     Currently in Pain?  Yes    Pain Score  7     Pain Location  Neck    Pain Orientation  Left    Pain Descriptors / Indicators  Pressure    Pain Type  Chronic pain    Pain Onset  More than a month ago    Pain Frequency  Intermittent    Aggravating Factors   looking down and turning head    Pain Relieving Factors  pain medication          [facility] PT Assessment - 07/29/18 1110      AROM   Overall AROM   Deficits    AROM Assessment Site  Cervical    Cervical Flexion  35    Cervical Extension  40    Cervical - Right Side Bend  31    Cervical - Left Side Bend  40    Cervical - Right Rotation  54    Cervical - Left Rotation  54                   [facility] Adult PT Treatment/Exercise - 07/29/18 1110      Posture/Postural Control   Posture Comments  Pt reports he is doing wall squats with trunk extension in wall and neck retraction.  Pt demonstrated to PT and note shoulder elevation, therefore educated on proper technique and educated that he did not have to do wall squat but could just use wall as reference.  Pt also reporting that he did a lot of gardening over the last week and  was on hands and knees likely with increased cervical extension and poor posture.  Demonstrated how to use gardening bench to either maintain good balance/posture on knees or sitting to do tasks at waist level to maintain proper posture.  Pt reports he actually has one but forgot about it.  Encouraged him to use and be aware of posture throughout all aspects of daily mobility.  Pt verbalized understanding.       Exercises   Exercises  Neck      Neck Exercises: Standing   Neck Retraction  10 reps;5 secs    Neck Retraction Limitations  VC for form      Neck Exercises: Seated   Other Seated Exercise  SNAG (mobilization with movement) with use of towel roll into R and L rotation x 5 reps on each side with 5 sec hold.  Provided for HEP but will need to go over at next visit to ensure technique.              PT Education - 07/29/18 1313    Education Details  Progress on goals, posture, HEP    Person(s) Educated  Patient    Methods  Explanation;Demonstration;Handout    Comprehension  Verbalized understanding;Returned demonstration       PT Short Term Goals - 07/29/18 1115      PT SHORT TERM GOAL #1   Title  Pt will initiate  HEP for improved flexibility and decreased pain.  (Target Date: 07/22/18-Date extended by one week to reflect delay in start)    Baseline  met per verbal report     Time  3    Period  Weeks    Status  Achieved      PT SHORT TERM GOAL #2   Title  Pt will report 25% decrease in headaches during the day in order to increase pts ability to participate in leisure activities.      Baseline  reports being unsure if 25% better     Time  3    Period  Weeks    Status  Achieved      PT SHORT TERM GOAL #3   Title  Pt to complete NDI and appropriate LTG to be set.     Baseline  Did not capture on first or second visit    Time  3    Period  Weeks    Status  Deferred      PT SHORT TERM GOAL #4   Title  Pt will improve cervical rotation (B) and flex/ext by 10 deg in order to indicate improved spinal mobility and improved ability to perform ADLs.     Time  3    Period  Weeks    Status  Partially Met        PT Long Term Goals - 06/24/18 1519      PT LONG TERM GOAL #1   Title  Pt will be independent with final HEP in order to indicate improved ROM, flexibility and decreased pain.  (Target Date: 08/15/18-Date extended to reflect 1 week delay in start of care)     Time  6    Period  Weeks    Status  New    Target Date  08/15/18      PT LONG TERM GOAL #2   Title  Pt will report 50% decrease in headaches in order to increase pts ability to participate in leisure activities.     Time  6    Period  Weeks    Status  New      PT LONG TERM GOAL #3   Title  Pt will increase cervical rotation (B), flex, ext by 15 deg and side bend (B) by 5 deg in order to indicate improved mobility and ability to carry out ADLs.     Time  6    Status  New            Plan - 07/29/18 1313    Clinical Impression Statement  Session focused on assessment of STGs.  Note that he has met 2/4 STGs, partially meeting ROM goal and PT deferring NDI goal as it was not assessed within first two visits.  Pt is making  progress towards goals and reports overall improved headaches and PT notes marked improvement in posture.  Will continue to benefit from skilled PT to address remaining deficits.      Rehab Potential  Good    Clinical Impairments Affecting Rehab Potential  length of time since injury    PT Frequency  2x / week    PT Duration  6 weeks    PT Treatment/Interventions  ADLs/Self Care Home Management;Electrical Stimulation;Moist Heat;Ultrasound;DME Instruction;Functional mobility training;Therapeutic activities;Therapeutic exercise;Neuromuscular re-education;Patient/family education;Manual techniques;Passive range of motion;Dry needling;Taping    PT Next Visit Plan  He seemed to tolerate DN to suboccipital region well, reports less headaches overall    Consulted and Agree with Plan of Care  Patient    Family Member Consulted  significant other       Patient will benefit from skilled therapeutic intervention in order to improve the following deficits and impairments:  Decreased mobility, Decreased range of motion, Hypomobility, Impaired flexibility, Impaired perceived functional ability, Postural dysfunction, Improper body mechanics  Visit Diagnosis: Cervicalgia  Abnormal posture     Problem List Patient Active Problem List   Diagnosis Date Noted  . Frequent headaches 04/14/2018  . COPD (chronic obstructive pulmonary disease) with emphysema ([place]) 12/14/2017  . Helicobacter pylori gastritis 07/21/2016  . CAD (coronary artery disease) 07/21/2016    Cameron Sprang, PT, MPT [facility] [address] [place], [place], [address] Phone: [phone]   Fax:  [phone] 07/29/18, 1:18 PM  Name: Colm Lyford MRN: [id number] Date of Birth: 1941-05-18

## 2018-08-01 ENCOUNTER — Ambulatory Visit: Payer: PPO | Admitting: Rehabilitation

## 2018-08-01 ENCOUNTER — Encounter: Payer: Self-pay | Admitting: Rehabilitation

## 2018-08-01 DIAGNOSIS — M542 Cervicalgia: Secondary | ICD-10-CM

## 2018-08-01 DIAGNOSIS — R293 Abnormal posture: Secondary | ICD-10-CM

## 2018-08-01 NOTE — Therapy (Signed)
[facility] [address] [place], [place], [address] Phone: [phone]   Fax:  [phone]  Physical Therapy Treatment  Patient Details  Name: Bill [name] MRN: [id number] Date of Birth: Aug 10, 1941 Referring Provider (PT): Metta Clines, DO   Encounter Date: 08/01/2018  PT End of Session - 08/01/18 1307    Visit Number  8    Number of Visits  13    Date for PT Re-Evaluation  08/23/18    Authorization Type  [employer]    PT Start Time  1147    PT Stop Time  1230    PT Time Calculation (min)  43 min    Activity Tolerance  Patient tolerated treatment well    Behavior During Therapy  [facility] for tasks assessed/performed       Past Medical History:  Diagnosis Date  . Anemia   . Anxiety   . CAD (coronary artery disease) 07/21/2016  . Cardiac arrhythmia   . COPD (chronic obstructive pulmonary disease) ([place])   . Helicobacter pylori gastritis 07/21/2016  . Penicillin allergy 07/21/2016  . Pneumonia   . Skin cancer     Past Surgical History:  Procedure Laterality Date  . APPENDECTOMY    . back fusion    . cataract surgery Left   . CHOLECYSTECTOMY    . COLONOSCOPY  2014   clear   . SPINE SURGERY  1984   thoracic spine fusion     There were no vitals filed for this visit.  Subjective Assessment - 08/01/18 1159    Subjective  Pt reports that he has continued headaches.  Is unsure if they are getting better or not.  Still reports that they seem to come on when he looks down and back up    Patient is accompained by:  Family member    Limitations  Standing    Patient Stated Goals  "to get rid of this headache."     Currently in Pain?  Yes    Pain Score  5     Pain Location  Head    Pain Orientation  Left    Pain Descriptors / Indicators  Aching;Pressure    Pain Type  Chronic pain    Pain Onset  More than a month ago    Pain Frequency  Intermittent    Aggravating Factors   looking down and back up     Pain Relieving Factors  pain medication                        [facility] Adult PT Treatment/Exercise - 08/01/18 1150      Self-Care   Self-Care  Posture;Other Self-Care Comments    Posture  Continue to review proper posture with all mobility and ADLs as this seems to be biggest source of mal-alignment and shoulder/neck pain and tension.  Pt is able to verbalize understanding, but during most conversation during session will revert back to poor forward shoulder posture with forward head.      Other Self-Care Comments   Discussed how to poor posture and poor scapular positioning (poor stability) can cause increased pain due to mal alignment.  Pt verbalized understanding.       Exercises   Exercises  Other Exercises    Other Exercises   Quadruped moving LUE forward/backward x 10 reps progressing to L UE extension x 10 reps with emphasis on scapular stabilization.        Manual  Therapy   Manual Therapy  Soft tissue mobilization;Scapular mobilization;Myofascial release    Manual therapy comments  Manual therapy to B upper trap, scalene, R parascapular muscles, R anterior serratus for decreased pain and improved flexibility.     Soft tissue mobilization  STM to B upper trap, R parascapular muscles along with R serratus anterior    Myofascial Release  trigger point release at R lateral parascapular region    Scapular Mobilization  Mobilization with movement into shoulder flex and extension in both SL and sitting with PT providing assist for scapular depression, downward rotation and adduction.               PT Education - 08/01/18 1307    Education Details  see self care    Person(s) Educated  Patient    Methods  Explanation    Comprehension  Verbalized understanding       PT Short Term Goals - 07/29/18 1115      PT SHORT TERM GOAL #1   Title  Pt will initiate HEP for improved flexibility and decreased pain.  (Target Date: 07/22/18-Date extended by one week to reflect  delay in start)    Baseline  met per verbal report     Time  3    Period  Weeks    Status  Achieved      PT SHORT TERM GOAL #2   Title  Pt will report 25% decrease in headaches during the day in order to increase pts ability to participate in leisure activities.      Baseline  reports being unsure if 25% better     Time  3    Period  Weeks    Status  Achieved      PT SHORT TERM GOAL #3   Title  Pt to complete NDI and appropriate LTG to be set.     Baseline  Did not capture on first or second visit    Time  3    Period  Weeks    Status  Deferred      PT SHORT TERM GOAL #4   Title  Pt will improve cervical rotation (B) and flex/ext by 10 deg in order to indicate improved spinal mobility and improved ability to perform ADLs.     Time  3    Period  Weeks    Status  Partially Met        PT Long Term Goals - 06/24/18 1519      PT LONG TERM GOAL #1   Title  Pt will be independent with final HEP in order to indicate improved ROM, flexibility and decreased pain.  (Target Date: 08/15/18-Date extended to reflect 1 week delay in start of care)     Time  6    Period  Weeks    Status  New    Target Date  08/15/18      PT LONG TERM GOAL #2   Title  Pt will report 50% decrease in headaches in order to increase pts ability to participate in leisure activities.     Time  6    Period  Weeks    Status  New      PT LONG TERM GOAL #3   Title  Pt will increase cervical rotation (B), flex, ext by 15 deg and side bend (B) by 5 deg in order to indicate improved mobility and ability to carry out ADLs.     Time  6  Status  New            Plan - 08/01/18 1307    Clinical Impression Statement  Skilled session focused on max education on how posture affects both shoulder and neck/head issues that he is having.  Feel that component of his R shoulder pain is coming from poor scapular stability and alignment which is likely also causing increased tension in neck muscles.  Note that he does  seem to have weakness around scapula also with trigger point noted at lateral aspect of scapula.  Will address as able.     Rehab Potential  Good    Clinical Impairments Affecting Rehab Potential  length of time since injury    PT Frequency  2x / week    PT Duration  6 weeks    PT Treatment/Interventions  ADLs/Self Care Home Management;Electrical Stimulation;Moist Heat;Ultrasound;DME Instruction;Functional mobility training;Therapeutic activities;Therapeutic exercise;Neuromuscular re-education;Patient/family education;Manual techniques;Passive range of motion;Dry needling;Taping    PT Next Visit Plan  Continue DN to suboccipital.  Shanon Brow, will you look at his R shoulder/scapula.  I could feel a large trigger point in what seemed to be serratus, but I'm not sure.      Consulted and Agree with Plan of Care  Patient       Patient will benefit from skilled therapeutic intervention in order to improve the following deficits and impairments:  Decreased mobility, Decreased range of motion, Hypomobility, Impaired flexibility, Impaired perceived functional ability, Postural dysfunction, Improper body mechanics  Visit Diagnosis: Cervicalgia  Abnormal posture     Problem List Patient Active Problem List   Diagnosis Date Noted  . Frequent headaches 04/14/2018  . COPD (chronic obstructive pulmonary disease) with emphysema ([place]) 12/14/2017  . Helicobacter pylori gastritis 07/21/2016  . CAD (coronary artery disease) 07/21/2016   Cameron Sprang, PT, MPT [facility] [address] [place], [place], [address] Phone: [phone]   Fax:  [phone] 08/01/18, 1:10 PM  Name: Bill [name] MRN: [id number] Date of Birth: 11-Nov-1940

## 2018-08-02 ENCOUNTER — Ambulatory Visit: Payer: PPO | Admitting: Rehabilitation

## 2018-08-03 ENCOUNTER — Encounter: Payer: Self-pay | Admitting: Physical Therapy

## 2018-08-03 ENCOUNTER — Ambulatory Visit: Payer: PPO | Admitting: Physical Therapy

## 2018-08-03 DIAGNOSIS — M542 Cervicalgia: Secondary | ICD-10-CM

## 2018-08-03 DIAGNOSIS — R293 Abnormal posture: Secondary | ICD-10-CM

## 2018-08-04 ENCOUNTER — Encounter: Payer: Self-pay | Admitting: Physical Therapy

## 2018-08-04 NOTE — Therapy (Signed)
[facility] [place], [place], [address] Phone: [phone]   Fax:  [phone]  Physical Therapy Treatment  Patient Details  Name: Bill [name] MRN: [id number] Date of Birth: 1941-07-02 Referring Provider (PT): Metta Clines, DO   Encounter Date: 08/03/2018  PT End of Session - 08/04/18 0921    Visit Number  9    Number of Visits  13    Date for PT Re-Evaluation  08/23/18    Authorization Type  [employer]    PT Start Time  1330    PT Stop Time  1410    PT Time Calculation (min)  40 min    Activity Tolerance  Patient tolerated treatment well    Behavior During Therapy  [facility] for tasks assessed/performed       Past Medical History:  Diagnosis Date  . Anemia   . Anxiety   . CAD (coronary artery disease) 07/21/2016  . Cardiac arrhythmia   . COPD (chronic obstructive pulmonary disease) ([place])   . Helicobacter pylori gastritis 07/21/2016  . Penicillin allergy 07/21/2016  . Pneumonia   . Skin cancer     Past Surgical History:  Procedure Laterality Date  . APPENDECTOMY    . back fusion    . cataract surgery Left   . CHOLECYSTECTOMY    . COLONOSCOPY  2014   clear   . SPINE SURGERY  1984   thoracic spine fusion     There were no vitals filed for this visit.  Subjective Assessment - 08/03/18 1443    Subjective  Patient reports he can not see any real difference with therapy. He reports after the last time he had needling he had a sharp pain that when over the top of his head and into his eye. He reports he alosmt had to stop his car. The pain wnet away and did not return.     Patient Stated Goals  "to get rid of this headache."     Currently in Pain?  Yes    Pain Score  5     Pain Location  Head    Pain Orientation  Left    Pain Descriptors / Indicators  Aching    Pain Type  Chronic pain    Pain Radiating Towards  up arpund his head and into his temples today     Pain Frequency  Intermittent    Aggravating Factors   looking up and backwards    Pain Relieving Factors  pain medication     Effect of Pain on Daily Activities  difficulty tuning his head                        [facility] Adult PT Treatment/Exercise - 08/04/18 0001      Neck Exercises: Standing   Neck Retraction  10 reps;5 secs    Neck Retraction Limitations  VC for form      Manual Therapy   Soft tissue mobilization  STM to B upper trap, R parascapular muscles along with R serratus anterior; IATYM to upper traps    Myofascial Release  trigger point release at R lateral parascapular region      Neck Exercises: Stretches   Upper Trapezius Stretch  Left;3 reps;30 seconds             PT Education - 08/04/18 0920    Education Details  reviewed the improitance of posture and how his cervical muscle can eeffect his  head    Person(s) Educated  Patient    Methods  Explanation;Demonstration;Tactile cues;Verbal cues    Comprehension  Verbalized understanding;Returned demonstration;Verbal cues required;Tactile cues required       PT Short Term Goals - 07/29/18 1115      PT SHORT TERM GOAL #1   Title  Pt will initiate HEP for improved flexibility and decreased pain.  (Target Date: 07/22/18-Date extended by one week to reflect delay in start)    Baseline  met per verbal report     Time  3    Period  Weeks    Status  Achieved      PT SHORT TERM GOAL #2   Title  Pt will report 25% decrease in headaches during the day in order to increase pts ability to participate in leisure activities.      Baseline  reports being unsure if 25% better     Time  3    Period  Weeks    Status  Achieved      PT SHORT TERM GOAL #3   Title  Pt to complete NDI and appropriate LTG to be set.     Baseline  Did not capture on first or second visit    Time  3    Period  Weeks    Status  Deferred      PT SHORT TERM GOAL #4   Title  Pt will improve cervical rotation (B) and flex/ext by 10 deg in order to indicate improved  spinal mobility and improved ability to perform ADLs.     Time  3    Period  Weeks    Status  Partially Met        PT Long Term Goals - 06/24/18 1519      PT LONG TERM GOAL #1   Title  Pt will be independent with final HEP in order to indicate improved ROM, flexibility and decreased pain.  (Target Date: 08/15/18-Date extended to reflect 1 week delay in start of care)     Time  6    Period  Weeks    Status  New    Target Date  08/15/18      PT LONG TERM GOAL #2   Title  Pt will report 50% decrease in headaches in order to increase pts ability to participate in leisure activities.     Time  6    Period  Weeks    Status  New      PT LONG TERM GOAL #3   Title  Pt will increase cervical rotation (B), flex, ext by 15 deg and side bend (B) by 5 deg in order to indicate improved mobility and ability to carry out ADLs.     Time  6    Status  New            Plan - 08/04/18 0925    Clinical Impression Statement  Patient had a good twitch resposne to his upper traps. Therapy aain reviewed with the patient how his neck pain and stiffness can effect his head. The patient Continues to ask the same question every time he comes. He is doing his chin tucks at home but it is questionable if he is doing his other exercises at home and doing them correctly.     Clinical Presentation  Stable    Clinical Decision Making  Low    Rehab Potential  Good    Clinical Impairments Affecting Rehab Potential  length of  time since injury    PT Frequency  2x / week    PT Duration  6 weeks    PT Treatment/Interventions  ADLs/Self Care Home Management;Electrical Stimulation;Moist Heat;Ultrasound;DME Instruction;Functional mobility training;Therapeutic activities;Therapeutic exercise;Neuromuscular re-education;Patient/family education;Manual techniques;Passive range of motion;Dry needling;Taping    PT Next Visit Plan  Continue DN to suboccipital.  Shanon Brow, will you look at his R shoulder/scapula.  I could feel a  large trigger point in what seemed to be serratus, but I'm not sure.         Patient will benefit from skilled therapeutic intervention in order to improve the following deficits and impairments:  Decreased mobility, Decreased range of motion, Hypomobility, Impaired flexibility, Impaired perceived functional ability, Postural dysfunction, Improper body mechanics  Visit Diagnosis: Cervicalgia  Abnormal posture     Problem List Patient Active Problem List   Diagnosis Date Noted  . Frequent headaches 04/14/2018  . COPD (chronic obstructive pulmonary disease) with emphysema ([place]) 12/14/2017  . Helicobacter pylori gastritis 07/21/2016  . CAD (coronary artery disease) 07/21/2016    Carney Living PT DPT  08/04/2018, 9:29 AM  [facility] [address] [place], [place], [address] Phone: [phone]   Fax:  [phone]  Name: Bill [name] MRN: [id number] Date of Birth: [birth year]

## 2018-08-09 ENCOUNTER — Ambulatory Visit: Payer: PPO | Admitting: Rehabilitation

## 2018-08-09 ENCOUNTER — Encounter: Payer: Self-pay | Admitting: Rehabilitation

## 2018-08-09 DIAGNOSIS — M542 Cervicalgia: Secondary | ICD-10-CM | POA: Diagnosis not present

## 2018-08-09 DIAGNOSIS — R293 Abnormal posture: Secondary | ICD-10-CM

## 2018-08-09 NOTE — Patient Instructions (Addendum)
EXTENSION: Supine - Elbow Extended (Isometric)    Lie on back, right arm at side on surface. Press arm down into surface with elbow straight. Hold _5__ seconds. Complete _2__ sets of _10__ repetitions. Perform _1-2__ sessions per day.  Copyright  VHI. All rights reserved.     TRUNK EXTENSION - TOWEL - AROM - MOBILIZATION  While sitting in a chair, extend your thoracic spine backwards over a rolled up towel against the back rest.   Do a chin tuck, do NOT extend neck and look up.  Hold for 10-15 secs.  Do 3-4 times per day.       Suboccipital Release with balls  Take 2 tennis balls (can also try other balls) and tape them together, or alternatively place in a pillow-case or sock.  Laying on your back on a firm surface (does not work as well in bed), place the two tennis balls at the base of your skull right where the bone of your skull meets the muscles. Relax your head over the balls, your head should be in a "chin-tuck" position as opposed to extended backwards. You should be able to relax your head and not have to work at maintaining your head in that position, if the balls feel like they're sliding down just place the balls slightly higher.   This activity can work wonders for people with classic tension headache issues as well as a variety of other cervical ailments.   Lie on tennis balls 2 mins at a time 2-3 times per day.      Carney Living, PT (Physical Therapist)             Access Code: [email or web address]  URL: [email or web address]  Date: 07/29/2018  Prepared by: Cameron Sprang   Exercises   Seated Assisted Cervical Rotation with Towel - 5 reps - 1 sets - 5-7 hold - 1x daily - 7x weekly

## 2018-08-09 NOTE — Therapy (Addendum)
[facility] [address] [place], [place], [address] Phone: [phone]   Fax:  [phone]  Physical Therapy Treatment and Progress Note  Patient Details  Name: Bill [name] MRN: [id number] Date of Birth: [birth year] Referring Provider (PT): Metta Clines, DO   Encounter Date: 08/09/2018  PT End of Session - 08/09/18 1157    Visit Number  10    Number of Visits  13    Date for PT Re-Evaluation  08/23/18    Authorization Type  [employer]    PT Start Time  1153   pt late to session   PT Stop Time  1232    PT Time Calculation (min)  39 min    Activity Tolerance  Patient tolerated treatment well    Behavior During Therapy  [facility] for tasks assessed/performed       Past Medical History:  Diagnosis Date  . Anemia   . Anxiety   . CAD (coronary artery disease) 07/21/2016  . Cardiac arrhythmia   . COPD (chronic obstructive pulmonary disease) ([place])   . Helicobacter pylori gastritis 07/21/2016  . Penicillin allergy 07/21/2016  . Pneumonia   . Skin cancer     Past Surgical History:  Procedure Laterality Date  . APPENDECTOMY    . back fusion    . cataract surgery Left   . CHOLECYSTECTOMY    . COLONOSCOPY  2014   clear   . SPINE SURGERY  1984   thoracic spine fusion     There were no vitals filed for this visit.  Subjective Assessment - 08/09/18 1155    Subjective  Reports he feels like things are getting a little better overall, but still has times when pain "hits me."     Patient is accompained by:  Family member    Limitations  Standing    Patient Stated Goals  "to get rid of this headache."     Currently in Pain?  Yes    Pain Score  4     Pain Location  Neck    Pain Orientation  Right;Left    Pain Descriptors / Indicators  Aching    Pain Type  Chronic pain    Pain Onset  More than a month ago    Pain Frequency  Intermittent    Aggravating Factors   looking up and backwards    Pain Relieving  Factors  pain medication                        [facility] Adult PT Treatment/Exercise - 08/09/18 0001      Exercises   Exercises  Neck;Shoulder    Other Exercises   Reviewed all home exercises as PT questions compliance and technique with certain exercises.  PT provided hand over hand and verbal cues for correct technique.  See pt instructions for details.       Neck Exercises: Standing   Other Standing Exercises  isometric shoulder extension with neck retraction against wall as another version of exercise from HEP.  Min cues for posture and reduced shoulder elevation.       Neck Exercises: Prone   Shoulder Extension  10 reps   with cues for scap retraction   Plank  modified plank on elbows with knees down on mat.  Elevating x 10 reps with cues for correct technique.          Therex:  See pt instruction.   Self Care:  Continue to educate on importance of compliance with HEP and stretching to see carryover from therapy visits.  Also continue to educate on how tightness and decreased mobility can refer pain to headaches.  PT has answered these questions several times during sessions.     PT Education - 08/09/18 1254    Education Details  importance of compliance with HEP esp stretches    Person(s) Educated  Patient    Methods  Explanation;Demonstration;Handout    Comprehension  Verbalized understanding;Returned demonstration       PT Short Term Goals - 07/29/18 1115      PT SHORT TERM GOAL #1   Title  Pt will initiate HEP for improved flexibility and decreased pain.  (Target Date: 07/22/18-Date extended by one week to reflect delay in start)    Baseline  met per verbal report     Time  3    Period  Weeks    Status  Achieved      PT SHORT TERM GOAL #2   Title  Pt will report 25% decrease in headaches during the day in order to increase pts ability to participate in leisure activities.      Baseline  reports being unsure if 25% better     Time  3    Period   Weeks    Status  Achieved      PT SHORT TERM GOAL #3   Title  Pt to complete NDI and appropriate LTG to be set.     Baseline  Did not capture on first or second visit    Time  3    Period  Weeks    Status  Deferred      PT SHORT TERM GOAL #4   Title  Pt will improve cervical rotation (B) and flex/ext by 10 deg in order to indicate improved spinal mobility and improved ability to perform ADLs.     Time  3    Period  Weeks    Status  Partially Met        PT Long Term Goals - 06/24/18 1519      PT LONG TERM GOAL #1   Title  Pt will be independent with final HEP in order to indicate improved ROM, flexibility and decreased pain.  (Target Date: 08/15/18-Date extended to reflect 1 week delay in start of care)     Time  6    Period  Weeks    Status  New    Target Date  08/15/18      PT LONG TERM GOAL #2   Title  Pt will report 50% decrease in headaches in order to increase pts ability to participate in leisure activities.     Time  6    Period  Weeks    Status  New      PT LONG TERM GOAL #3   Title  Pt will increase cervical rotation (B), flex, ext by 15 deg and side bend (B) by 5 deg in order to indicate improved mobility and ability to carry out ADLs.     Time  6    Status  New            Plan - 08/09/18 1157    Clinical Impression Statement  Reviewed current HEP as PT feels that he may not be doing often or correctly.   There were several exercises that pt was unable to recall and needed max cues for technique.  Overall, do feel that pt  has made progress with improved posture and muscle flexibility, however he reports he is unsure if he has made any progress.  Will likely D/c next week.      Rehab Potential  Good    Clinical Impairments Affecting Rehab Potential  length of time since injury    PT Frequency  2x / week    PT Duration  6 weeks    PT Treatment/Interventions  ADLs/Self Care Home Management;Electrical Stimulation;Moist Heat;Ultrasound;DME  Instruction;Functional mobility training;Therapeutic activities;Therapeutic exercise;Neuromuscular re-education;Patient/family education;Manual techniques;Passive range of motion;Dry needling;Taping    PT Next Visit Plan  Continue postural exercises, manual therapy to cervical region to improve rotation, suboccipital release       Patient will benefit from skilled therapeutic intervention in order to improve the following deficits and impairments:  Decreased mobility, Decreased range of motion, Hypomobility, Impaired flexibility, Impaired perceived functional ability, Postural dysfunction, Improper body mechanics  Visit Diagnosis: Cervicalgia  Abnormal posture    Progress Note Reporting Period 06/24/18 to 08/09/18  See note below for Objective Data and Assessment of Progress/Goals.       Problem List Patient Active Problem List   Diagnosis Date Noted  . Frequent headaches 04/14/2018  . COPD (chronic obstructive pulmonary disease) with emphysema ([place]) 12/14/2017  . Helicobacter pylori gastritis 07/21/2016  . CAD (coronary artery disease) 07/21/2016    Cameron Sprang, PT, MPT [facility] [address] [place], [place], [address] Phone: [phone]   Fax:  [phone] 08/09/18, 12:59 PM  Name: Bill [name] MRN: [id number] Date of Birth: 04-Jan-1941

## 2018-08-11 ENCOUNTER — Telehealth: Payer: Self-pay | Admitting: Physical Therapy

## 2018-08-11 NOTE — Telephone Encounter (Signed)
Opened in error

## 2018-08-12 ENCOUNTER — Encounter: Payer: Self-pay | Admitting: Rehabilitation

## 2018-08-12 ENCOUNTER — Ambulatory Visit: Payer: PPO | Admitting: Rehabilitation

## 2018-08-12 DIAGNOSIS — M542 Cervicalgia: Secondary | ICD-10-CM

## 2018-08-12 DIAGNOSIS — R293 Abnormal posture: Secondary | ICD-10-CM

## 2018-08-12 NOTE — Therapy (Signed)
[facility] [address] [place], [place], [address] Phone: [phone]   Fax:  [phone]  Physical Therapy Treatment  Patient Details  Name: Bill [name] MRN: [id number] Date of Birth: 11-04-1940 Referring Provider (PT): Metta Clines, DO   Encounter Date: 08/12/2018  PT End of Session - 08/12/18 1104    Visit Number  11    Number of Visits  13    Date for PT Re-Evaluation  08/23/18    Authorization Type  [employer]    PT Start Time  1104    PT Stop Time  1146    PT Time Calculation (min)  42 min    Activity Tolerance  Patient tolerated treatment well    Behavior During Therapy  [facility] for tasks assessed/performed       Past Medical History:  Diagnosis Date  . Anemia   . Anxiety   . CAD (coronary artery disease) 07/21/2016  . Cardiac arrhythmia   . COPD (chronic obstructive pulmonary disease) ([place])   . Helicobacter pylori gastritis 07/21/2016  . Penicillin allergy 07/21/2016  . Pneumonia   . Skin cancer     Past Surgical History:  Procedure Laterality Date  . APPENDECTOMY    . back fusion    . cataract surgery Left   . CHOLECYSTECTOMY    . COLONOSCOPY  2014   clear   . SPINE SURGERY  1984   thoracic spine fusion     There were no vitals filed for this visit.  Subjective Assessment - 08/12/18 1100    Subjective  Pt reports he has been busy with wife having OP back surgery.  Still having headaches.     Patient is accompained by:  Family member    Limitations  Standing    Patient Stated Goals  "to get rid of this headache."     Currently in Pain?  Yes   no pain, but reports it comes and goes   Pain Score  0-No pain    Pain Location  Neck    Pain Orientation  Left;Right    Pain Descriptors / Indicators  Aching    Pain Type  Chronic pain    Pain Onset  More than a month ago    Pain Frequency  Intermittent    Aggravating Factors   looking up and down     Pain Relieving Factors  pain  medication         [facility] PT Assessment - 08/12/18 1137      AROM   Cervical Flexion  45   after manual therapy   Cervical Extension  37   after manual therapy   Cervical - Right Rotation  60   following manual therapy   Cervical - Left Rotation  56   following manual therapy                  [facility] Adult PT Treatment/Exercise - 08/12/18 1105      Neck Exercises: Supine   Neck Retraction  10 reps;5 secs    Other Supine Exercise  Contract/relax into cervical rotation x 3 sets with 5 sec holds then into futher ROM with 3 more reps of 5 sec holds in each direction.       Manual Therapy   Manual Therapy  Joint mobilization;Soft tissue mobilization;Myofascial release;Manual Traction    Manual therapy comments  Manual therapy to B upper trap, scalene, R parascapular muscles, R anterior serratus for decreased pain and  improved flexibility.     Joint Mobilization  Grade II/III lateral glide mobilization to entire cervical spine (more emphasis on lower cervical spine) to improve B rotation.  Did bilaterally x 2 reps.  Prone upper and mid thoracic PA mobs Grade II/III to reduce pain and improve joint ROM.  Pt tolerated well.      Soft tissue mobilization  STM to B upper traps, paracervical muscles and B levator    Myofascial Release  First rib mob on L side.  Trigger point release to B rhomboids while in prone position.     Passive ROM  Passive ROM x 5 reps each side with cervical rotation.      Manual Traction  Manual traction using PTs forearm to improve force given.               PT Education - 08/12/18 1103    Education Details  use of heat during stretching    Person(s) Educated  Patient    Methods  Explanation    Comprehension  Verbalized understanding       PT Short Term Goals - 07/29/18 1115      PT SHORT TERM GOAL #1   Title  Pt will initiate HEP for improved flexibility and decreased pain.  (Target Date: 07/22/18-Date extended by one week to reflect delay  in start)    Baseline  met per verbal report     Time  3    Period  Weeks    Status  Achieved      PT SHORT TERM GOAL #2   Title  Pt will report 25% decrease in headaches during the day in order to increase pts ability to participate in leisure activities.      Baseline  reports being unsure if 25% better     Time  3    Period  Weeks    Status  Achieved      PT SHORT TERM GOAL #3   Title  Pt to complete NDI and appropriate LTG to be set.     Baseline  Did not capture on first or second visit    Time  3    Period  Weeks    Status  Deferred      PT SHORT TERM GOAL #4   Title  Pt will improve cervical rotation (B) and flex/ext by 10 deg in order to indicate improved spinal mobility and improved ability to perform ADLs.     Time  3    Period  Weeks    Status  Partially Met        PT Long Term Goals - 06/24/18 1519      PT LONG TERM GOAL #1   Title  Pt will be independent with final HEP in order to indicate improved ROM, flexibility and decreased pain.  (Target Date: 08/15/18-Date extended to reflect 1 week delay in start of care)     Time  6    Period  Weeks    Status  New    Target Date  08/15/18      PT LONG TERM GOAL #2   Title  Pt will report 50% decrease in headaches in order to increase pts ability to participate in leisure activities.     Time  6    Period  Weeks    Status  New      PT LONG TERM GOAL #3   Title  Pt will increase cervical rotation (B), flex, ext by  15 deg and side bend (B) by 5 deg in order to indicate improved mobility and ability to carry out ADLs.     Time  6    Status  New            Plan - 08/12/18 1104    Clinical Impression Statement  Skilled session focused on manual therapy to reduce pain, improve flexibility and improve spinal mobility/ROM.  Note marked improvement with cervical flexion following manual therapy and slight improvement with active cervical rotation.  Will prepare for D/C next week.     Rehab Potential  Good     Clinical Impairments Affecting Rehab Potential  length of time since injury    PT Frequency  2x / week    PT Duration  6 weeks    PT Treatment/Interventions  ADLs/Self Care Home Management;Electrical Stimulation;Moist Heat;Ultrasound;DME Instruction;Functional mobility training;Therapeutic activities;Therapeutic exercise;Neuromuscular re-education;Patient/family education;Manual techniques;Passive range of motion;Dry needling;Taping    PT Next Visit Plan  Continue postural exercises, manual therapy to cervical region to improve rotation, suboccipital release, work towards D/C on 10/25       Patient will benefit from skilled therapeutic intervention in order to improve the following deficits and impairments:  Decreased mobility, Decreased range of motion, Hypomobility, Impaired flexibility, Impaired perceived functional ability, Postural dysfunction, Improper body mechanics  Visit Diagnosis: Cervicalgia  Abnormal posture     Problem List Patient Active Problem List   Diagnosis Date Noted  . Frequent headaches 04/14/2018  . COPD (chronic obstructive pulmonary disease) with emphysema ([place]) 12/14/2017  . Helicobacter pylori gastritis 07/21/2016  . CAD (coronary artery disease) 07/21/2016    Cameron Sprang, PT, MPT [facility] [address] [place], [place], [address] Phone: [phone]   Fax:  [phone] 08/12/18, 1:33 PM  Name: Bill [name] MRN: [id number] Date of Birth: [birth year]

## 2018-08-16 ENCOUNTER — Ambulatory Visit: Payer: PPO | Admitting: Rehabilitation

## 2018-08-17 ENCOUNTER — Telehealth: Payer: Self-pay | Admitting: Family Medicine

## 2018-08-17 MED ORDER — NITROGLYCERIN 0.4 MG SL SUBL: 0.4000 mg | SUBLINGUAL_TABLET | SUBLINGUAL | 5 refills | Status: DC | PRN

## 2018-08-17 NOTE — Telephone Encounter (Signed)
rx for the nitro has been sent to the pharmacy.  I have called the pt and lmom to make them aware.

## 2018-08-17 NOTE — Telephone Encounter (Signed)
Dr. Sarajane Jews please advise on refilling the nitro for this pt.  I did not see this on his list of medications.  thanks

## 2018-08-17 NOTE — Telephone Encounter (Signed)
Call in Nitrostat 0.4 mg tablets to use prn chest pain, #25 with 5 rf

## 2018-08-17 NOTE — Telephone Encounter (Signed)
Patient needs a refill for Nitro called in to his pharmacy called in asap.  He is completely out.  Pharmacy: [facility]ela at [facility]

## 2018-08-19 ENCOUNTER — Ambulatory Visit: Payer: PPO | Admitting: Rehabilitation

## 2018-08-19 ENCOUNTER — Encounter: Payer: Self-pay | Admitting: Rehabilitation

## 2018-08-19 DIAGNOSIS — M542 Cervicalgia: Secondary | ICD-10-CM

## 2018-08-19 DIAGNOSIS — R293 Abnormal posture: Secondary | ICD-10-CM

## 2018-08-19 NOTE — Therapy (Signed)
[facility] [address] [place], [place], [address] Phone: [phone]   Fax:  [phone]  Physical Therapy Treatment and D/C Summary   Patient Details  Name: Bill [name] MRN: [id number] Date of Birth: 1941-08-23 Referring Provider (PT): Metta Clines, DO   Encounter Date: 08/19/2018  PT End of Session - 08/19/18 1115    Visit Number  12    Number of Visits  13    Date for PT Re-Evaluation  08/23/18    Authorization Type  [employer]    PT Start Time  1103    PT Stop Time  1145    PT Time Calculation (min)  42 min    Activity Tolerance  Patient tolerated treatment well    Behavior During Therapy  [facility] for tasks assessed/performed       Past Medical History:  Diagnosis Date  . Anemia   . Anxiety   . CAD (coronary artery disease) 07/21/2016  . Cardiac arrhythmia   . COPD (chronic obstructive pulmonary disease) ([place])   . Helicobacter pylori gastritis 07/21/2016  . Penicillin allergy 07/21/2016  . Pneumonia   . Skin cancer     Past Surgical History:  Procedure Laterality Date  . APPENDECTOMY    . back fusion    . cataract surgery Left   . CHOLECYSTECTOMY    . COLONOSCOPY  2014   clear   . SPINE SURGERY  1984   thoracic spine fusion     There were no vitals filed for this visit.  Subjective Assessment - 08/19/18 1109    Subjective  "There is still something wrong up there." Reports that he feels pain is not happening as often as it was but also reports that he feels that it is getting worse.      Patient is accompained by:  Family member    Limitations  Standing    Patient Stated Goals  "to get rid of this headache."     Currently in Pain?  No/denies         [facility] PT Assessment - 08/19/18 1124      AROM   Overall AROM   Deficits    AROM Assessment Site  Cervical    Cervical Flexion  33    Cervical Extension  40    Cervical - Right Side Bend  33    Cervical - Left Side Bend  37    Cervical - Right Rotation  62    Cervical - Left Rotation  52                   [facility] Adult PT Treatment/Exercise - 08/19/18 0001      Self-Care   Other Self-Care Comments   Continue to provide max education on importance of postural awareness and consistently with HEP.  Pt has some noted memory deficits (repeats many things and will sometimes contradict what he intiially said) therefore feel that this has impacted his progress with therapy.   Recommended that when he follow up with Dr. Tomi Likens in December he verbalize concerns and perhaps new imaging would be warranted.  Pt verbalized understanding.       Exercises   Other Exercises   Reviewed all HEP exercises, except for SNAG exercises as pt reports this increased pain.  Pt unable to verbalize any of his exercise program without cues and demonstration from PT.  Feel that this is likely cause of him not making progress with therapy.  See pt instruction for details.       Manual Therapy   Manual Therapy  Soft tissue mobilization;Myofascial release    Manual therapy comments  STM and trigger point release to B upper trap and levator along with paracervical muscles     Soft tissue mobilization  STM to B upper traps, paracervical muscles and B levator    Myofascial Release  trigger point release to B upper trap/levator         EXTENSION: Supine - Elbow Extended (Isometric)    Lie on back, right arm at side on surface. Press arm down into surface with elbow straight. Hold _5__ seconds. Complete _2__ sets of _10__ repetitions. Perform _1-2__ sessions per day.  Copyright  VHI. All rights reserved.    TRUNK EXTENSION - TOWEL - AROM - MOBILIZATION  While sitting in a chair, extend your thoracic spine backwards over a rolled up towel against the back rest. Do a chin tuck, do NOT extend neck and look up. Hold for 10-15 secs. Do 3-4 times per day.      Suboccipital Release with balls  Take 2 tennis balls (can  also try other balls) and tape them together, or alternatively place in a pillow-case or sock.  Laying on your back on a firm surface (does not work as well in bed), place the two tennis balls at the base of your skull right where the bone of your skull meets the muscles. Relax your head over the balls, your head should be in a "chin-tuck" position as opposed to extended backwards. You should be able to relax your head and not have to work at maintaining your head in that position, if the balls feel like they're sliding down just place the balls slightly higher.   This activity can work wonders for people with classic tension headache issues as well as a variety of other cervical ailments.   Lie on tennis balls 2 mins at a time 2-3 times per day.    Carney Living, PT (Physical Therapist)              PT Education - 08/19/18 1313    Education Details  see self care    Person(s) Educated  Patient    Methods  Explanation    Comprehension  Verbalized understanding       PT Short Term Goals - 07/29/18 1115      PT SHORT TERM GOAL #1   Title  Pt will initiate HEP for improved flexibility and decreased pain.  (Target Date: 07/22/18-Date extended by one week to reflect delay in start)    Baseline  met per verbal report     Time  3    Period  Weeks    Status  Achieved      PT SHORT TERM GOAL #2   Title  Pt will report 25% decrease in headaches during the day in order to increase pts ability to participate in leisure activities.      Baseline  reports being unsure if 25% better     Time  3    Period  Weeks    Status  Achieved      PT SHORT TERM GOAL #3   Title  Pt to complete NDI and appropriate LTG to be set.     Baseline  Did not capture on first or second visit    Time  3    Period  Weeks    Status  Deferred  PT SHORT TERM GOAL #4   Title  Pt will improve cervical rotation (B) and flex/ext by 10 deg in order to indicate improved spinal mobility and improved  ability to perform ADLs.     Time  3    Period  Weeks    Status  Partially Met        PT Long Term Goals - 08/19/18 1115      PT LONG TERM GOAL #1   Title  Pt will be independent with final HEP in order to indicate improved ROM, flexibility and decreased pain.  (Target Date: 08/15/18-Date extended to reflect 1 week delay in start of care)     Time  6    Period  Weeks    Status  Not Met      PT LONG TERM GOAL #2   Title  Pt will report 50% decrease in headaches in order to increase pts ability to participate in leisure activities.     Baseline  Pt reports that it is still about only 25% better     Time  6    Period  Weeks    Status  Not Met      PT LONG TERM GOAL #3   Title  Pt will increase cervical rotation (B), flex, ext by 15 deg and side bend (B) by 5 deg in order to indicate improved mobility and ability to carry out ADLs.     Baseline  see flow sheets    Time  6    Status  Not Met            Plan - 08/19/18 1115    Clinical Impression Statement  Skilled session focused on assessment of LTGs.  Pt has not met any LTGs but at the same time reports pain is not as intense as it was and in the same instance states he feels things are getting worse.  Feel that there have been some compliance issues due to some cognitive deficits, but recommend he let MD know continued issues in December when he follows up.     Rehab Potential  Good    Clinical Impairments Affecting Rehab Potential  length of time since injury    PT Frequency  2x / week    PT Duration  6 weeks    PT Treatment/Interventions  ADLs/Self Care Home Management;Electrical Stimulation;Moist Heat;Ultrasound;DME Instruction;Functional mobility training;Therapeutic activities;Therapeutic exercise;Neuromuscular re-education;Patient/family education;Manual techniques;Passive range of motion;Dry needling;Taping    PT Next Visit Plan  --       Patient will benefit from skilled therapeutic intervention in order to  improve the following deficits and impairments:  Decreased mobility, Decreased range of motion, Hypomobility, Impaired flexibility, Impaired perceived functional ability, Postural dysfunction, Improper body mechanics  Visit Diagnosis: Cervicalgia  Abnormal posture    PHYSICAL THERAPY DISCHARGE SUMMARY  Visits from Start of Care: 12  Current functional level related to goals / functional outcomes: See LTGs   Remaining deficits: Pt continues to have decreased cervical mobility esp into L rotation due to decreased flexibility and poor posture.     Education / Equipment: HEP  Plan: Patient agrees to discharge.  Patient goals were not met. Patient is being discharged due to lack of progress.  ?????       Problem List Patient Active Problem List   Diagnosis Date Noted  . Frequent headaches 04/14/2018  . COPD (chronic obstructive pulmonary disease) with emphysema ([place]) 12/14/2017  . Helicobacter pylori gastritis 07/21/2016  . CAD (coronary  artery disease) 07/21/2016    Cameron Sprang, PT, MPT [facility] [address] [place], [place], [address] Phone: [phone]   Fax:  [phone] 08/19/18, 1:17 PM  Name: Bill [name] MRN: [id number] Date of Birth: 03-25-1941

## 2018-08-19 NOTE — Patient Instructions (Addendum)
EXTENSION: Supine - Elbow Extended (Isometric)    Lie on back, right arm at side on surface. Press arm down into surface with elbow straight. Hold _5__ seconds. Complete _2__ sets of _10__ repetitions. Perform _1-2__ sessions per day.  Copyright  VHI. All rights reserved.    TRUNK EXTENSION - TOWEL - AROM - MOBILIZATION  While sitting in a chair, extend your thoracic spine backwards over a rolled up towel against the back rest. Do a chin tuck, do NOT extend neck and look up. Hold for 10-15 secs. Do 3-4 times per day.      Suboccipital Release with balls  Take 2 tennis balls (can also try other balls) and tape them together, or alternatively place in a pillow-case or sock.  Laying on your back on a firm surface (does not work as well in bed), place the two tennis balls at the base of your skull right where the bone of your skull meets the muscles. Relax your head over the balls, your head should be in a "chin-tuck" position as opposed to extended backwards. You should be able to relax your head and not have to work at maintaining your head in that position, if the balls feel like they're sliding down just place the balls slightly higher.   This activity can work wonders for people with classic tension headache issues as well as a variety of other cervical ailments.   Lie on tennis balls 2 mins at a time 2-3 times per day.    Carney Living, PT (Physical Therapist)

## 2018-10-06 DIAGNOSIS — D485 Neoplasm of uncertain behavior of skin: Secondary | ICD-10-CM | POA: Diagnosis not present

## 2018-10-06 DIAGNOSIS — C44329 Squamous cell carcinoma of skin of other parts of face: Secondary | ICD-10-CM | POA: Diagnosis not present
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
## 2018-10-13 ENCOUNTER — Ambulatory Visit: Payer: PPO | Admitting: Neurology

## 2018-10-13 ENCOUNTER — Encounter: Payer: Self-pay | Admitting: Neurology

## 2018-10-13 VITALS — BP 130/72 | HR 53 | Ht 68.0 in | Wt 177.0 lb

## 2018-10-13 DIAGNOSIS — G4486 Cervicogenic headache: Secondary | ICD-10-CM

## 2018-10-13 DIAGNOSIS — R51 Headache: Secondary | ICD-10-CM

## 2018-10-13 DIAGNOSIS — M542 Cervicalgia: Secondary | ICD-10-CM

## 2018-10-13 NOTE — Patient Instructions (Signed)
1.  Follow up with Dr. [name].  If he does not have any recommendations, then please make a follow up appointment with me.

## 2018-10-25 ENCOUNTER — Ambulatory Visit: Payer: Self-pay | Admitting: *Deleted

## 2018-10-25 NOTE — Telephone Encounter (Signed)
Pt calling with complaints of sore throat since Sunday. Pt states he also has a dry non productive cough and has been taking ZiCam and Doxycycline which was previously prescribed. Pt advised not to take Doxycycline due to current symptoms due antibiotics not being helpful for vial sore throats. Pt verbalized understanding.Pt previously scheduled for appt on 10/27/18 with Dr. Sarajane Jews. Pt given home care advice to help with sore throat until appt on Thursday with Dr. Sarajane Jews. Pt advised that if he has any worsening of symptoms to seek treatment in the ED.   Reason for Disposition . [1] Sore throat with cough/cold symptoms AND [2] present < 5 days  Answer Assessment - Initial Assessment Questions 1. ONSET: "When did the throat start hurting?" (Hours or days ago)      Sunday, and gets worse with night fall 2. SEVERITY: "How bad is the sore throat?" (Scale 1-10; mild, moderate or severe)   - MILD (1-3):  doesn't interfere with eating or normal activities   - MODERATE (4-7): interferes with eating some solids and normal activities   - SEVERE (8-10):  excruciating pain, interferes with most normal activities   - SEVERE DYSPHAGIA: can't swallow liquids, drooling     Mild,2-3 3. STREP EXPOSURE: "Has there been any exposure to strep within the past week?" If so, ask: "What type of contact occurred?"      No 4.  VIRAL SYMPTOMS: "Are there any symptoms of a cold, such as a runny nose, cough, hoarse voice or red eyes?"      Cough ,sneeze 5. FEVER: "Do you have a fever?" If so, ask: "What is your temperature, how was it measured, and when did it start?"     No 6. PUS ON THE TONSILS: "Is there pus on the tonsils in the back of your throat?"     No  7. OTHER SYMPTOMS: "Do you have any other symptoms?" (e.g., difficulty breathing, headache, rash)     No  Protocols used: SORE THROAT-A-AH

## 2018-10-27 ENCOUNTER — Ambulatory Visit (INDEPENDENT_AMBULATORY_CARE_PROVIDER_SITE_OTHER): Payer: Medicare Other | Admitting: Family Medicine

## 2018-10-27 ENCOUNTER — Encounter: Payer: Self-pay | Admitting: Family Medicine

## 2018-10-27 VITALS — BP 144/80 | HR 63 | Temp 97.6°F | Wt 177.4 lb

## 2018-10-27 DIAGNOSIS — J019 Acute sinusitis, unspecified: Secondary | ICD-10-CM | POA: Diagnosis not present

## 2018-10-27 MED ORDER — DOXYCYCLINE HYCLATE 100 MG PO TABS: 100.0000 mg | ORAL_TABLET | Freq: Two times a day (BID) | ORAL | 0 refills | Status: DC

## 2018-10-27 NOTE — Progress Notes (Signed)
   Subjective:    Patient ID: Bill [name], male    DOB: May 24, 1941, 78 y.o.   MRN: [id number]  HPI Here for one week of sinus congestion, PND, and a dry cough. No fever.    Review of Systems  Constitutional: Negative.   HENT: Positive for congestion, postnasal drip and sinus pressure. Negative for sinus pain and sore throat.   Eyes: Negative.   Respiratory: Positive for cough.        Objective:   Physical Exam Constitutional:      Appearance: Normal appearance. He is well-developed.  HENT:     Right Ear: Tympanic membrane and ear canal normal.     Left Ear: Tympanic membrane and ear canal normal.     Nose: Nose normal. No congestion.     Mouth/Throat:     Pharynx: Oropharynx is clear.  Eyes:     Conjunctiva/sclera: Conjunctivae normal.  Pulmonary:     Effort: Pulmonary effort is normal. No respiratory distress.     Breath sounds: Normal breath sounds. No stridor. No wheezing, rhonchi or rales.  Neurological:     Mental Status: He is alert.           Assessment & Plan:  Sinusitis, treat with Doxycycline. Alysia Penna, MD

## 2018-11-17 ENCOUNTER — Encounter: Payer: Self-pay | Admitting: Nurse Practitioner

## 2018-11-17 ENCOUNTER — Ambulatory Visit (INDEPENDENT_AMBULATORY_CARE_PROVIDER_SITE_OTHER): Payer: Medicare Other | Admitting: Nurse Practitioner

## 2018-11-17 ENCOUNTER — Ambulatory Visit: Payer: Self-pay

## 2018-11-17 VITALS — BP 110/70 | HR 56 | Temp 97.9°F | Ht 68.0 in | Wt 179.6 lb

## 2018-11-17 DIAGNOSIS — S60465A Insect bite (nonvenomous) of left ring finger, initial encounter: Secondary | ICD-10-CM

## 2018-11-17 DIAGNOSIS — W57XXXA Bitten or stung by nonvenomous insect and other nonvenomous arthropods, initial encounter: Secondary | ICD-10-CM | POA: Diagnosis not present

## 2018-11-17 MED ORDER — CEPHALEXIN 500 MG PO CAPS: 500.0000 mg | ORAL_CAPSULE | Freq: Three times a day (TID) | ORAL | 0 refills | Status: AC

## 2018-11-17 MED ORDER — MUPIROCIN 2 % EX OINT: 1.0000 "application " | TOPICAL_OINTMENT | Freq: Two times a day (BID) | CUTANEOUS | 0 refills | Status: AC

## 2018-11-17 NOTE — Progress Notes (Signed)
Subjective:  Patient ID: Bill [name], male    DOB: Apr 24, 1941  Age: 78 y.o. MRN: [id number]  CC: Insect Bite (spider bite left ring finger, swollen, 2 tiny bumps, clear drainage/ not sure when it happened)  HPI Mr. Meroney presents with insect bite on left ring finger. Noticed yesterday. Did not see type of insect. He susspects it was a spider. Has redness and swelling. Some improvement with application of neosporin. UTD with TDAP.  Reviewed past Medical, Social and Family history today.  Outpatient Medications Prior to Visit  Medication Sig Dispense Refill  . albuterol (PROVENTIL HFA;VENTOLIN HFA) 108 (90 Base) MCG/ACT inhaler Inhale 2 puffs into the lungs every 6 (six) hours as needed for wheezing or shortness of breath.    Marland Kitchen aspirin 81 MG tablet Take 81 mg by mouth daily.    Marland Kitchen b complex vitamins tablet Take 1 tablet by mouth daily.    . bisoprolol (ZEBETA) 5 MG tablet Take 0.5 tablets (2.5 mg total) by mouth daily. 45 tablet 3  . Calcium Citrate-Vitamin D (CALCIUM CITRATE +D PO) Take 1 capsule by mouth daily.    . carboxymethylcellulose (REFRESH PLUS) 0.5 % SOLN 1 drop daily as needed.    . Carboxymethylcellulose Sodium (LUBRICANT EYE DROPS OP) Apply 1 drop to eye as needed.    . clotrimazole-betamethasone (LOTRISONE) cream     . diclofenac (CATAFLAM) 50 MG tablet Take 1 tablet (50 mg total) by mouth every 8 (eight) hours as needed (pain). 3 tablet 0  . loratadine (CLARITIN) 10 MG tablet Take 10 mg by mouth daily.    . Magnesium Oxide 500 MG CAPS Take 2 capsules by mouth daily.    . Multiple Vitamins-Minerals (PRESERVISION AREDS 2 PO) Take 1 tablet by mouth 2 (two) times daily.    . multivitamin-lutein (OCUVITE-LUTEIN) CAPS capsule Take 1 capsule by mouth daily.    . nitroGLYCERIN (NITROSTAT) 0.4 MG SL tablet Place 1 tablet (0.4 mg total) under the tongue every 5 (five) minutes as needed for chest pain. 25 tablet 5  . Potassium 99 MG TABS Take 1 tablet by mouth daily.      . vitamin E 400 UNIT capsule Take 400 Units by mouth daily.    . cyanocobalamin 1000 MCG tablet Take 1,000 mcg by mouth daily.    Marland Kitchen doxycycline (VIBRA-TABS) 100 MG tablet Take 1 tablet (100 mg total) by mouth 2 (two) times daily. (Patient not taking: Reported on 11/17/2018) 20 tablet 0  . Flaxseed Oil OIL by Does not apply route.    . pantoprazole (PROTONIX) 40 MG tablet Take 1 tablet (40 mg total) by mouth daily. (Patient not taking: Reported on 11/17/2018) 90 tablet 3  . ranitidine (ZANTAC) 150 MG tablet Take 150 mg by mouth 2 (two) times daily.     Facility-Administered Medications Prior to Visit  Medication Dose Route Frequency Provider Last Rate Last Dose  . 0.9 %  sodium chloride infusion  500 mL Intravenous Continuous Milus Banister, MD        ROS See HPI  Objective:  BP 110/70   Pulse (!) 56   Temp 97.9 F (36.6 C) (Oral)   Ht 5\' 8"  (1.727 m)   Wt 179 lb 9.6 oz (81.5 kg)   SpO2 94%   BMI 27.31 kg/m   BP Readings from Last 3 Encounters:  11/17/18 110/70  10/27/18 (!) 144/80  10/13/18 130/72    Wt Readings from Last 3 Encounters:  11/17/18 179 lb 9.6 oz (81.5  kg)  10/27/18 177 lb 6 oz (80.5 kg)  10/13/18 177 lb (80.3 kg)    Physical Exam Cardiovascular:     Rate and Rhythm: Normal rate.  Pulmonary:     Effort: Pulmonary effort is normal.  Musculoskeletal:     Left hand: He exhibits normal range of motion, no tenderness, no bony tenderness and no swelling. Normal sensation noted. Normal strength noted.       Hands:     Lab Results  Component Value Date   WBC 13.9 (H) 05/20/2018   HGB 14.7 05/20/2018   HCT 44.0 05/20/2018   PLT 366.0 05/20/2018   GLUCOSE 155 (H) 05/23/2018   CHOL 183 05/20/2018   TRIG 180.0 (H) 05/20/2018   HDL 34.00 (L) 05/20/2018   LDLCALC 113 (H) 05/20/2018   ALT 36 05/20/2018   AST 26 05/20/2018   NA 138 05/23/2018   K 4.0 05/23/2018   CL 104 05/23/2018   CREATININE 0.91 05/23/2018   BUN 11 05/23/2018   CO2 27 05/23/2018    TSH 3.88 05/20/2018   PSA 0.31 05/20/2018    Dg Chest 2 View  Result Date: 06/07/2016 CLINICAL DATA:  Left arm pain and tingling with left chest soreness. EXAM: CHEST  2 VIEW COMPARISON:  12/18/2014 FINDINGS: Mild atherosclerotic calcification of the aortic arch. Mild biapical pleural parenchymal scarring. Heart size within normal limits. No pleural effusion. Thoracic spondylosis. Minimal likely physiologic wedging at the thoracolumbar junction. Emphysema noted. IMPRESSION: 1. Emphysema with biapical pleural parenchymal scarring. No acute findings. 2. Atherosclerotic aortic arch. 3. Thoracic spondylosis. Electronically Signed   By: Van Clines M.D.   On: 06/07/2016 18:27    Assessment & Plan:   Nikola was seen today for insect bite.  Diagnoses and all orders for this visit:  Insect bite of left ring finger, initial encounter -     mupirocin ointment (BACTROBAN) 2 %; Place 1 application into the nose 2 (two) times daily for 5 days. -     cephALEXin (KEFLEX) 500 MG capsule; Take 1 capsule (500 mg total) by mouth 3 (three) times daily for 5 days.   I am having Omid S. Dismore start on mupirocin ointment and cephALEXin. I am also having him maintain his albuterol, aspirin, b complex vitamins, Calcium Citrate-Vitamin D (CALCIUM CITRATE +D PO), cyanocobalamin, loratadine, Carboxymethylcellulose Sodium (LUBRICANT EYE DROPS OP), Magnesium Oxide, multivitamin-lutein, Potassium, Multiple Vitamins-Minerals (PRESERVISION AREDS 2 PO), vitamin E, clotrimazole-betamethasone, Flaxseed Oil, carboxymethylcellulose, pantoprazole, bisoprolol, diclofenac, ranitidine, nitroGLYCERIN, and doxycycline. We will continue to administer sodium chloride.  Meds ordered this encounter  Medications  . mupirocin ointment (BACTROBAN) 2 %    Sig: Place 1 application into the nose 2 (two) times daily for 5 days.    Dispense:  15 g    Refill:  0    Order Specific Question:   Supervising Provider    Answer:    Lucille Passy [[id number]]  . cephALEXin (KEFLEX) 500 MG capsule    Sig: Take 1 capsule (500 mg total) by mouth 3 (three) times daily for 5 days.    Dispense:  15 capsule    Refill:  0    Order Specific Question:   Supervising Provider    Answer:   Lucille Passy [[id number]]    Problem List Items Addressed This Visit    None    Visit Diagnoses    Insect bite of left ring finger, initial encounter    -  Primary   Relevant Medications   mupirocin  ointment (BACTROBAN) 2 %   cephALEXin (KEFLEX) 500 MG capsule       Follow-up: No follow-ups on file.  Wilfred Lacy, NP

## 2018-11-17 NOTE — Patient Instructions (Signed)
Use Bactroban ointment x 3-5days. Start keflex if no improvement in 5days. You are up to date with TDAP.   Insect Bite, Adult An insect bite can make your skin red, itchy, and swollen. Some insects can spread disease to people with a bite. However, most insect bites do not lead to disease, and most are not serious. What are the causes? Insects may bite for many reasons, including:  Hunger.  To defend themselves. Insects that bite include:  Spiders.  Mosquitoes.  Ticks.  Fleas.  Ants.  Flies.  Kissing bugs.  Chiggers. What are the signs or symptoms? Symptoms of this condition include:  Itching or pain in the bite area.  Redness and swelling in the bite area.  An open wound (skin ulcer). Symptoms often last for 2-4 days. In rare cases, a person may have a very bad allergic reaction (anaphylactic reaction) to a bite. Symptoms of an anaphylactic reaction may include:  Feeling warm in the face (flushed). Your face may turn red.  Itchy, red, swollen areas of skin (hives).  Swelling of the: ? Eyes. ? Lips. ? Face. ? Mouth. ? Tongue. ? Throat.  Trouble with any of these: ? Breathing. ? Talking. ? Swallowing.  Loud breathing (wheezing).  Feeling dizzy or light-headed.  Passing out (fainting).  Pain or cramps in your belly.  Throwing up (vomiting).  Watery poop (diarrhea). How is this treated? Treatment is usually not needed. Symptoms often go away on their own. When treatment is needed, it may involve:  Putting a cream or lotion on the bite area. This helps with itching.  Taking an antibiotic medicine. This treatment is needed if the bite area gets infected.  Getting a tetanus shot, if you are not up to date on this vaccine.  Putting ice on the affected area.  Using medicines called antihistamines. This treatment may be needed if you have itching or an allergic reaction to the insect bite.  Giving yourself a shot of medicine (epinephrine) using  an auto-injector "pen" if you have an anaphylactic reaction to a bite. Your doctor will teach you how to use this pen. Follow these instructions at home: Bite area care   Do not scratch the bite area.  Keep the bite area clean and dry.  Wash the bite area every day with soap and water as told by your doctor.  Check the bite area every day for signs of infection. Check for: ? Redness, swelling, or pain. ? Fluid or blood. ? Warmth. ? Pus or a bad smell. Managing pain, itching, and swelling   You may put any of these on the bite area as told by your doctor: ? A paste made of baking soda and water. ? Cortisone cream. ? Calamine lotion.  If told, put ice on the bite area. ? Put ice in a plastic bag. ? Place a towel between your skin and the bag. ? Leave the ice on for 20 minutes, 2-3 times a day. General instructions  Apply or take over-the-counter and prescription medicines only as told by your doctor.  If you were prescribed an antibiotic medicine, take or apply it as told by your doctor. Do not stop using the antibiotic even if your condition improves.  Keep all follow-up visits as told by your doctor. This is important. How is this prevented? To help you have a lower risk of insect bites:  When you are outside, wear clothing that covers your arms and legs.  Use insect repellent. The best insect  repellents contain one of these: ? DEET. ? Picaridin. ? Oil of lemon eucalyptus (OLE). ? [id number].  Consider spraying your clothing with a pesticide called permethrin. Permethrin helps prevent insect bites. It works for several weeks and for up to 5-6 clothing washes. Do not apply permethrin directly to the skin.  If your home windows do not have screens, think about putting some in.  If you will be sleeping in an area where there are mosquitoes, consider covering your sleeping area with a mosquito net. Contact a doctor if:  You have redness, swelling, or pain in the bite  area.  You have fluid or blood coming from the bite area.  The bite area feels warm to the touch.  You have pus or a bad smell coming from the bite area.  You have a fever. Get help right away if:  You have joint pain.  You have a rash.  You feel more tired or sleepy than you normally do.  You have neck pain.  You have a headache.  You feel weaker than you normally do.  You have signs of an anaphylactic reaction. Signs may include: ? Feeling warm in the face. ? Itchy, red, swollen areas of skin. ? Swelling of your:  Eyes.  Lips.  Face.  Mouth.  Tongue.  Throat. ? Trouble with any of these:  Breathing.  Talking.  Swallowing. ? Loud breathing. ? Feeling dizzy or light-headed. ? Passing out. ? Pain or cramps in your belly. ? Throwing up. ? Watery poop. These symptoms may be an emergency. Do not wait to see if the symptoms will go away. Do this right away:  Use your auto-injector pen as you have been told.  Get medical help. Call your local emergency services (911 in the [place] Do not drive yourself to the hospital. Summary  An insect bite can make your skin red, itchy, and swollen.  Treatment is usually not needed. Symptoms often go away on their own.  Do not scratch the bite area. Keep it clean and dry.  Ice can help with pain and itching from the bite. This information is not intended to replace advice given to you by your health care provider. Make sure you discuss any questions you have with your health care provider. Document Released: 10/09/2000 Document Revised: 04/22/2018 Document Reviewed: 04/22/2018 Elsevier Interactive Patient Education  2019 [employer].

## 2018-11-17 NOTE — Telephone Encounter (Signed)
Pt. Reports when he got up this morning, he noticed on his left hand, the ring finger had a bite mark. He states he is "pretty sure it is a spider bite." States it is itchy, not painful. The finger is hot to touch. Redness noted the width of the finger.No availability at [facility] today. Appointment made at [facility].  Reason for Disposition . [1] Red or very tender (to touch) area AND [2] started over 24 hours after the bite  Answer Assessment - Initial Assessment Questions 1. TYPE of INSECT: "What type of insect was it?"      Maybe a siper 2. ONSET: "When did you get bitten?"      Last night 3. LOCATION: "Where is the insect bite located?"      Left hand - ring finger 4. REDNESS: "Is the area red or pink?" If so, ask "What size is area of redness?" (inches or cm). "When did the redness start?"     The width of the finger 5. PAIN: "Is there any pain?" If so, ask: "How bad is it?"  (Scale 1-10; or mild, moderate, severe)     Just irritated 6. ITCHING: "Does it itch?" If so, ask: "How bad is the itch?"    - MILD: doesn't interfere with normal activities   - MODERATE - SEVERE: interferes with work, school, sleep, or other activities      Itchy Moderate 7. SWELLING: "How big is the swelling?" (inches, cm, or compare to coins)     No  8. OTHER SYMPTOMS: "Do you have any other symptoms?"  (e.g., difficulty breathing, hives)     Hot to touch 9. PREGNANCY: "Is there any chance you are pregnant?" "When was your last menstrual period?"     n/a  Protocols used: INSECT BITE-A-AH

## 2018-11-18 DIAGNOSIS — C44329 Squamous cell carcinoma of skin of other parts of face: Secondary | ICD-10-CM | POA: Diagnosis not present

## 2019-01-18 ENCOUNTER — Ambulatory Visit: Payer: Self-pay | Admitting: *Deleted

## 2019-01-18 NOTE — Telephone Encounter (Signed)
Pt initially calling to report hematuria, also reports blood in semen. States urine is "Orange-red but not like blood. The blood is in my semen."  States discoloration of urine "For some time." States noted "Few drops of dark red blood in semen last night. Also reports pain, 4-5/10 with ejaculation. Denies fever, dysuria. Does report mild bilateral flank pain "Only one day last week." Denies frequency, urgency. Pt does have iPhone for web visit. Does not have Email. Care advise given per protocol. Pt verbalizes understanding.  Please advise regarding appt. States may leave detailed message: [phone]  Reason for Disposition . Blood in urine  (Exception: could be normal menstrual bleeding)    And semen  Answer Assessment - Initial Assessment Questions 1. COLOR of URINE: "Describe the color of the urine."  (e.g., tea-colored, pink, red, blood clots, bloody)     Orange-red, "Not like bloody though." 2. ONSET: "When did the bleeding start?"      yesterday 3. EPISODES: "How many times has there been blood in the urine?" or "How many times today?"     One episode 4. PAIN with URINATION: "Is there any pain with passing your urine?" If so, ask: "How bad is the pain?"  (Scale 1-10; or mild, moderate, severe)    - MILD - complains slightly about urination hurting    - MODERATE - interferes with normal activities      - SEVERE - excruciating, unwilling or unable to urinate because of the pain      no 5. FEVER: "Do you have a fever?" If so, ask: "What is your temperature, how was it meared, and when did it start?"     no 6. ASSOCIATED SYMPTOMS: "Are you passing urine more frequently than usual?"     no 7. OTHER SYMPTOMS: "Do you have any other symptoms?" (e.g., back/flank pain, abdominal pain, vomiting)     Back pain "All the time" not new. Flank pain last week,duration 1 day. Blood in semen, pain with ejaculation.  Protocols used: URINE - BLOOD IN-A-AH

## 2019-01-19 NOTE — Telephone Encounter (Signed)
Will route to Dr Sarajane Jews  I apologized to patient that no one has reached out to him yet with recommendations. Aware that I am talking with Dr Sarajane Jews and will call him back.

## 2019-01-20 ENCOUNTER — Telehealth: Payer: Self-pay | Admitting: *Deleted

## 2019-01-20 ENCOUNTER — Ambulatory Visit (INDEPENDENT_AMBULATORY_CARE_PROVIDER_SITE_OTHER): Payer: Medicare Other | Admitting: Family Medicine

## 2019-01-20 ENCOUNTER — Encounter: Payer: Self-pay | Admitting: Family Medicine

## 2019-01-20 ENCOUNTER — Other Ambulatory Visit: Payer: Self-pay

## 2019-01-20 ENCOUNTER — Telehealth: Payer: Self-pay | Admitting: Family Medicine

## 2019-01-20 VITALS — BP 110/60 | HR 79 | Temp 99.5°F | Ht 68.0 in | Wt 180.6 lb

## 2019-01-20 DIAGNOSIS — R300 Dysuria: Secondary | ICD-10-CM | POA: Diagnosis not present

## 2019-01-20 DIAGNOSIS — R319 Hematuria, unspecified: Principal | ICD-10-CM

## 2019-01-20 DIAGNOSIS — N390 Urinary tract infection, site not specified: Secondary | ICD-10-CM | POA: Diagnosis not present

## 2019-01-20 LAB — POC URINALSYSI DIPSTICK (AUTOMATED)
Bilirubin, UA: NEGATIVE
Blood, UA: NEGATIVE
Glucose, UA: POSITIVE — AB
Ketones, UA: NEGATIVE
Leukocytes, UA: NEGATIVE
Nitrite, UA: NEGATIVE
Protein, UA: NEGATIVE
Spec Grav, UA: 1.015 (ref 1.010–1.025)
Urobilinogen, UA: 0.2 E.U./dL
pH, UA: 7 (ref 5.0–8.0)

## 2019-01-20 MED ORDER — CIPROFLOXACIN HCL 500 MG PO TABS: 500.0000 mg | ORAL_TABLET | Freq: Two times a day (BID) | ORAL | 0 refills | Status: DC

## 2019-01-20 NOTE — Telephone Encounter (Signed)
Copied from [place] [id number]. Topic: Quick Communication - See Telephone Encounter >> Jan 20, 2019  4:01 PM Rutherford Nail, NT wrote: CRM for notification. See Telephone encounter for: 01/20/19. Patient calling and states that he was seen in the office today for a UTI and was told that a medication would go to the pharmacy. States that the medication has not been sent yet. Per OV note, cipro was supposed to be sent in. Please advise. Patient awaiting medication at the pharmacy. [id number] [place] ([place]), [place] [address]

## 2019-01-20 NOTE — Telephone Encounter (Signed)
   Questions for Screening COVID-19  Symptom onset:  N/A   Travel or Contacts: No   During this illness, did/does the patient experience any of the following symptoms? Fever >100.45F []   Yes [x]   No []   Unknown Subjective fever (felt feverish) []   Yes [x]   No []   Unknown Chills []   Yes [x]   No []   Unknown Muscle aches (myalgia) []   Yes [x]   No []   Unknown Runny nose (rhinorrhea) []   Yes [x]   No []   Unknown Sore throat []   Yes [x]   No []   Unknown Cough (new onset or worsening of chronic cough) []   Yes []   No []   Unknown  Has COPD  Shortness of breath (dyspnea) []   Yes []   No []   Unknown Has COPD  Nausea or vomiting []   Yes [x]   No []   Unknown Headache []   Yes [x]   No []   Unknown   Abdominal pain  []   Yes []   No []   UnknownSince 06/2017 Diarrhea (?3 loose/looser than normal stools/24hr period) []   Yes [x]   No []   Unknown Other, specify:  Patient risk factors: Smoker? []   Current [x]   Former []   Never If male, currently pregnant? []   Yes [x]   No  Patient Active Problem List   Diagnosis Date Noted  . Frequent headaches 04/14/2018  . COPD (chronic obstructive pulmonary disease) with emphysema ([place]) 12/14/2017  . Helicobacter pylori gastritis 07/21/2016  . CAD (coronary artery disease) 07/21/2016    Plan:  []   High risk for COVID-19 with red flags go to ED (with CP, SOB, weak/lightheaded, or fever > 101.5). Call ahead.  []   High risk for COVID-19 but stable will have car visit. Inform provider and coordinate time. Will be completed in afternoon. [x]   No red flags but URI signs or symptoms will go through side door and be seen in dedicated room.  Note: Referral to telemedicine is an appropriate alternative disposition for higher risk but stable. [facility]/e-Visit: [phone].

## 2019-01-20 NOTE — Telephone Encounter (Signed)
Pt. Calling back because he has not heard back from Dr. Sarajane Jews. Spoke with Andris Baumann in the practice and she will call pt. Pt. Verbalizes understanding.

## 2019-01-20 NOTE — Progress Notes (Signed)
   Subjective:    Patient ID: Bill [name], male    DOB: [birth year], 78 y.o.   MRN: [id number]  HPI Here for 4 days of urinary urgency and burning. He has seen some blood in the urine. No fever or back pain or nausea. No testicular pain. Drinking plenty of water.    Review of Systems  Constitutional: Negative.   Respiratory: Negative.   Cardiovascular: Negative.   Gastrointestinal: Negative.   Genitourinary: Positive for dysuria, frequency, hematuria and urgency. Negative for flank pain and testicular pain.       Objective:   Physical Exam Constitutional:      Appearance: Normal appearance.  Cardiovascular:     Rate and Rhythm: Normal rate and regular rhythm.     Pulses: Normal pulses.     Heart sounds: Normal heart sounds.  Pulmonary:     Effort: Pulmonary effort is normal.     Breath sounds: Normal breath sounds.  Abdominal:     General: Abdomen is flat. Bowel sounds are normal. There is no distension.     Palpations: Abdomen is soft. There is no mass.     Tenderness: There is no abdominal tenderness. There is no guarding or rebound.     Hernia: No hernia is present.  Genitourinary:    Scrotum/Testes: Normal.     Prostate: Normal.  Neurological:     Mental Status: He is alert.           Assessment & Plan:  UTI, treat with Cipro. Culture the sample.  Alysia Penna, MD

## 2019-01-20 NOTE — Telephone Encounter (Signed)
Spoke with Dr. Sarajane Jews

## 2019-01-20 NOTE — Telephone Encounter (Signed)
RN spoke with Dr. Sarajane Jews. Order put in for Cipro 500 mg BID for 10 days.

## 2019-01-20 NOTE — Telephone Encounter (Signed)
Patient calling again to check status of medication. Patient requesting a call back as soon as medication is sent to pharmacy.

## 2019-01-20 NOTE — Telephone Encounter (Signed)
He is scheduled to be seen this afternoon.

## 2019-01-21 LAB — URINE CULTURE
MICRO NUMBER:: 358696
SPECIMEN QUALITY:: ADEQUATE

## 2019-01-24 ENCOUNTER — Telehealth: Payer: Self-pay

## 2019-01-24 MED ORDER — METHYLPREDNISOLONE 4 MG PO TBPK: ORAL_TABLET | ORAL | 0 refills | Status: DC

## 2019-01-24 NOTE — Telephone Encounter (Signed)
Dr. Fry please advise. Thanks  

## 2019-01-24 NOTE — Telephone Encounter (Signed)
Called and spoke with pt and he is aware of Dr. Fry's recs.  

## 2019-01-24 NOTE — Telephone Encounter (Signed)
Copied from [place] [phone]. Topic: General - Other >> Jan 24, 2019  2:28 PM Keene Breath wrote: Reason for CRM: Patient has a serious reaction to poison oak.  He stated that he thinks it is in his blood stream.  Patient said the last time he had it this bad, there was a pill that he took that clear it up.  Please advise and call patient back.  CB# [phone]

## 2019-01-24 NOTE — Telephone Encounter (Signed)
Call in a Medrol dose pack and he can soak in a luke warm tub of water with Aveeno powder in it

## 2019-02-08 ENCOUNTER — Other Ambulatory Visit: Payer: Self-pay

## 2019-02-08 ENCOUNTER — Encounter: Payer: Self-pay | Admitting: Family Medicine

## 2019-02-08 ENCOUNTER — Ambulatory Visit (INDEPENDENT_AMBULATORY_CARE_PROVIDER_SITE_OTHER): Payer: Medicare Other | Admitting: Family Medicine

## 2019-02-08 DIAGNOSIS — R300 Dysuria: Secondary | ICD-10-CM

## 2019-02-08 DIAGNOSIS — T50905A Adverse effect of unspecified drugs, medicaments and biological substances, initial encounter: Secondary | ICD-10-CM

## 2019-02-08 NOTE — Progress Notes (Signed)
   Subjective:    Patient ID: Bill [name], male    DOB: 1940-12-22, 78 y.o.   MRN: [id number]  HPI Here for burning pain on urinating that started about 3 weeks ago. He has had some urgency but has not seen blood in the urine. No back pain or fever. We felt he had a UTI on 01-20-19 and started him on Cipro 500 mg bid. 3 days later he developed a red itchy rash on both hands which we felt was the result of an allergic reaction, so on our advice he stopped the Cipro. The urine culture grew no bacteria. Since then the rash has resolved, but the dysuria has persisted. The rash has resolved.  Virtual Visit via Telephone Note  I connected with the patient on 02/08/19 at  2:45 PM EDT by telephone and verified that I am speaking with the correct person using two identifiers. We attempted to talk via [email or web address] but we had technical difficulty with both video and audio.   I discussed the limitations, risks, security and privacy concerns of performing an evaluation and management service by telephone and the availability of in person appointments. I also discussed with the patient that there may be a patient responsible charge related to this service. The patient expressed understanding and agreed to proceed.  Location patient: home Location provider: work or home office Participants present for the call: patient, provider Patient did not have a visit in the prior 7 days to address this/these issue(s).   History of Present Illness:    Observations/Objective: Patient sounds cheerful and well on the phone. I do not appreciate any SOB. Speech and thought processing are grossly intact. Patient reported vitals:  Assessment and Plan: He had an allergic reaction to Cipro but this has resolved. He still has issues with dysuria so we will refer him to Urology. Alysia Penna, MD    Follow Up Instructions:     [phone] [phone] 11-20 [phone] 21-30 I did not refer this patient for an OV in the  next 24 hours for this/these issue(s).  I discussed the assessment and treatment plan with the patient. The patient was provided an opportunity to ask questions and all were answered. The patient agreed with the plan and demonstrated an understanding of the instructions.   The patient was advised to call back or seek an in-person evaluation if the symptoms worsen or if the condition fails to improve as anticipated.  I provided 17  minutes of non-face-to-face time during this encounter.   Alysia Penna, MD  Review of Systems     Objective:   Physical Exam        Assessment & Plan:

## 2019-02-14 DIAGNOSIS — R310 Gross hematuria: Secondary | ICD-10-CM | POA: Diagnosis not present

## 2019-02-20 DIAGNOSIS — R310 Gross hematuria: Secondary | ICD-10-CM | POA: Diagnosis not present

## 2019-02-20 DIAGNOSIS — R3121 Asymptomatic microscopic hematuria: Secondary | ICD-10-CM | POA: Diagnosis not present

## 2019-03-14 ENCOUNTER — Telehealth: Payer: Self-pay

## 2019-03-14 NOTE — Telephone Encounter (Signed)
Pt called with request for OV. He states that the "issue" he thought was related to his medication has not resolved or improved. He would like to come into the office to see you. He does not want to do another telephone visit. He did not want to see another provider while you are out of the office. He would like to know your recommendations. Pt aware Dr. Sarajane Jews will not return until Tuesday.   Dr. Sarajane Jews - Please advise. Thanks!

## 2019-03-20 NOTE — Telephone Encounter (Signed)
Please set up an in person OV  

## 2019-03-21 NOTE — Telephone Encounter (Signed)
Please schedule OV in office per Dr. Sarajane Jews. Thanks!

## 2019-03-22 NOTE — Telephone Encounter (Signed)
Pt has been scheduled with Dr. Sarajane Jews on 03/28/2019 at 2:00pm for a in office visit.

## 2019-03-28 ENCOUNTER — Ambulatory Visit (INDEPENDENT_AMBULATORY_CARE_PROVIDER_SITE_OTHER): Payer: Medicare Other | Admitting: Family Medicine

## 2019-03-28 ENCOUNTER — Other Ambulatory Visit: Payer: Self-pay

## 2019-03-28 ENCOUNTER — Encounter: Payer: Self-pay | Admitting: Family Medicine

## 2019-03-28 VITALS — BP 124/70 | HR 65 | Temp 97.5°F | Wt 163.5 lb

## 2019-03-28 DIAGNOSIS — E1165 Type 2 diabetes mellitus with hyperglycemia: Secondary | ICD-10-CM | POA: Diagnosis not present

## 2019-03-28 DIAGNOSIS — W57XXXA Bitten or stung by nonvenomous insect and other nonvenomous arthropods, initial encounter: Secondary | ICD-10-CM

## 2019-03-28 DIAGNOSIS — B356 Tinea cruris: Secondary | ICD-10-CM | POA: Diagnosis not present

## 2019-03-28 DIAGNOSIS — T1490XA Injury, unspecified, initial encounter: Secondary | ICD-10-CM | POA: Diagnosis not present

## 2019-03-28 DIAGNOSIS — M791 Myalgia, unspecified site: Secondary | ICD-10-CM | POA: Diagnosis not present

## 2019-03-28 HISTORY — DX: Type 2 diabetes mellitus with hyperglycemia: E11.65

## 2019-03-28 LAB — POC URINALSYSI DIPSTICK (AUTOMATED)
Bilirubin, UA: NEGATIVE
Blood, UA: NEGATIVE
Glucose, UA: POSITIVE — AB
Leukocytes, UA: NEGATIVE
Nitrite, UA: NEGATIVE
Protein, UA: NEGATIVE
Spec Grav, UA: 1.015 (ref 1.010–1.025)
Urobilinogen, UA: 0.2 E.U./dL
pH, UA: 5.5 (ref 5.0–8.0)

## 2019-03-28 LAB — GLUCOSE, POCT (MANUAL RESULT ENTRY): POC Glucose: 544 mg/dl — AB (ref 70–99)

## 2019-03-28 MED ORDER — GLIPIZIDE 10 MG PO TABS: 10.0000 mg | ORAL_TABLET | Freq: Two times a day (BID) | ORAL | 3 refills | Status: DC

## 2019-03-28 MED ORDER — METFORMIN HCL 500 MG PO TABS: 500.0000 mg | ORAL_TABLET | Freq: Two times a day (BID) | ORAL | 3 refills | Status: DC

## 2019-03-28 MED ORDER — KETOCONAZOLE 2 % EX CREA: 1.0000 "application " | TOPICAL_CREAM | Freq: Two times a day (BID) | CUTANEOUS | 2 refills | Status: DC

## 2019-03-28 NOTE — Progress Notes (Signed)
   Subjective:    Patient ID: Bill [name], male    DOB: 1941/07/02, 78 y.o.   MRN: [id number]  HPI Here for one month of generalized fatigue, increased thirst, increased frequency of urination, and muscle aches. No fever or SOB or chest pain. He was given a course of Cipro in April for dysuria but the culture was negative. The burning stopped so he stopped the Cipro. He has had no burning ever since. He says has been bitten several times by ticks in the past few months. His father and brother had diabetes. He also mentions an itchy rash on the penis for several weeks.    Review of Systems  Constitutional: Positive for fatigue.  Respiratory: Negative.   Cardiovascular: Negative.   Endocrine: Positive for polydipsia and polyuria. Negative for cold intolerance and heat intolerance.  Genitourinary: Positive for frequency. Negative for difficulty urinating, dysuria, flank pain, hematuria and urgency.  Musculoskeletal: Positive for myalgias.  Skin: Positive for rash.       Objective:   Physical Exam Constitutional:      General: He is not in acute distress.    Appearance: Normal appearance.  Cardiovascular:     Rate and Rhythm: Normal rate and regular rhythm.     Pulses: Normal pulses.     Heart sounds: Normal heart sounds.  Pulmonary:     Effort: Pulmonary effort is normal.     Breath sounds: Normal breath sounds.  Abdominal:     General: Abdomen is flat. Bowel sounds are normal. There is no distension.     Palpations: Abdomen is soft. There is no mass.     Tenderness: There is no abdominal tenderness. There is no guarding or rebound.     Hernia: No hernia is present.  Genitourinary:    Comments: The penis and the foreskin have a macular red rash  Neurological:     General: No focal deficit present.     Mental Status: He is alert and oriented to person, place, and time.           Assessment & Plan:  Newly diagnosed diabetes mellitus and tinea cruris. We will  begin Metformin 500 mg bid and Glipizide 10 mg bid. We will refer him to Nutrition. Get labs including an A1c. He will drink lots of water and limit his calorie intake. Treat the tinea with Ketoconazole cream. Recheck in one week.  Alysia Penna, MD

## 2019-03-29 ENCOUNTER — Telehealth: Payer: Self-pay | Admitting: Family Medicine

## 2019-03-29 ENCOUNTER — Telehealth: Payer: Self-pay | Admitting: *Deleted

## 2019-03-29 LAB — CBC WITH DIFFERENTIAL/PLATELET
Basophils Absolute: 0.4 10*3/uL — ABNORMAL HIGH (ref 0.0–0.1)
Basophils Relative: 1.9 % (ref 0.0–3.0)
Eosinophils Absolute: 0.5 10*3/uL (ref 0.0–0.7)
Eosinophils Relative: 2.5 % (ref 0.0–5.0)
HCT: 48.2 % (ref 39.0–52.0)
Hemoglobin: 16 g/dL (ref 13.0–17.0)
Lymphocytes Relative: 15.1 % (ref 12.0–46.0)
Lymphs Abs: 2.8 10*3/uL (ref 0.7–4.0)
MCHC: 33.2 g/dL (ref 30.0–36.0)
MCV: 95.8 fl (ref 78.0–100.0)
Monocytes Absolute: 0 10*3/uL — ABNORMAL LOW (ref 0.1–1.0)
Monocytes Relative: 0.1 % — ABNORMAL LOW (ref 3.0–12.0)
Neutro Abs: 15.1 10*3/uL — ABNORMAL HIGH (ref 1.4–7.7)
Neutrophils Relative %: 80.4 % — ABNORMAL HIGH (ref 43.0–77.0)
Platelets: 412 10*3/uL — ABNORMAL HIGH (ref 150.0–400.0)
RBC: 5.03 Mil/uL (ref 4.22–5.81)
RDW: 17 % — ABNORMAL HIGH (ref 11.5–15.5)
WBC: 18.7 10*3/uL (ref 4.0–10.5)

## 2019-03-29 LAB — BASIC METABOLIC PANEL
BUN: 11 mg/dL (ref 6–23)
CO2: 24 mEq/L (ref 19–32)
Calcium: 9.9 mg/dL (ref 8.4–10.5)
Chloride: 92 mEq/L — ABNORMAL LOW (ref 96–112)
Creatinine, Ser: 0.9 mg/dL (ref 0.40–1.50)
GFR: 81.92 mL/min (ref >60.00)
Glucose, Bld: 521 mg/dL (ref 70–99)
Potassium: 4.7 mEq/L (ref 3.5–5.1)
Sodium: 127 mEq/L — ABNORMAL LOW (ref 135–145)

## 2019-03-29 LAB — HEMOGLOBIN A1C: Hgb A1c MFr Bld: 15 % — ABNORMAL HIGH (ref 4.6–6.5)

## 2019-03-29 LAB — TSH: TSH: 2.67 u[IU]/mL (ref 0.35–4.50)

## 2019-03-29 MED ORDER — GLIPIZIDE 10 MG PO TABS: 10.0000 mg | ORAL_TABLET | Freq: Two times a day (BID) | ORAL | 3 refills | Status: DC

## 2019-03-29 MED ORDER — METFORMIN HCL 500 MG PO TABS: 500.0000 mg | ORAL_TABLET | Freq: Two times a day (BID) | ORAL | 3 refills | Status: DC

## 2019-03-29 MED ORDER — KETOCONAZOLE 2 % EX CREA: 1.0000 "application " | TOPICAL_CREAM | Freq: Two times a day (BID) | CUTANEOUS | 2 refills | Status: DC

## 2019-03-29 NOTE — Telephone Encounter (Signed)
CRITICAL VALUE STICKER  CRITICAL VALUE: Glucose--521  RECEIVER (on-site recipient of call):Bill [name] NOTIFIED: 03/29/19  MESSENGER (representative from lab):[facility] at [facility]  MD NOTIFIED:Fry  TIME OF NOTIFICATION:11:04am  RESPONSE: URGENT Message sent to Dr Sarajane Jews and assistant informed also

## 2019-03-29 NOTE — Telephone Encounter (Signed)
Patient spoke with Hassan Rowan, [place] agent to request that diabetic medications and Ketoconazole be sent to [facility] on [address] due to [place] on [address] being closed. Hassan Rowan, [facility] agent advised that mediation would be re sent to requested pharmacy. Metformin, Glipizide and Ketoconazole that was previously sent to [facility] on [address] resent to [facility] on [address] in [place].

## 2019-03-29 NOTE — Telephone Encounter (Signed)
Patient called and says the [facility] in [place] that Dr. Sarajane Jews initially sent medications to is closed, so he wants them sent to [facility] on [address], [place]. Medications resent-Metformin, Glipizide and Ketoconazole cream. The pharmacy confirmed receipt.

## 2019-03-30 LAB — B. BURGDORFI ANTIBODIES: B burgdorferi Ab IgG+IgM: <0.9 index

## 2019-04-04 ENCOUNTER — Ambulatory Visit: Payer: Medicare Other | Admitting: Family Medicine

## 2019-04-05 ENCOUNTER — Ambulatory Visit (INDEPENDENT_AMBULATORY_CARE_PROVIDER_SITE_OTHER): Payer: Medicare Other | Admitting: Family Medicine

## 2019-04-05 ENCOUNTER — Other Ambulatory Visit: Payer: Self-pay

## 2019-04-05 ENCOUNTER — Encounter: Payer: Self-pay | Admitting: Family Medicine

## 2019-04-05 VITALS — BP 122/80 | HR 80 | Temp 98.4°F | Wt 166.1 lb

## 2019-04-05 DIAGNOSIS — E1165 Type 2 diabetes mellitus with hyperglycemia: Secondary | ICD-10-CM | POA: Diagnosis not present

## 2019-04-05 DIAGNOSIS — R1030 Lower abdominal pain, unspecified: Secondary | ICD-10-CM

## 2019-04-05 DIAGNOSIS — I2510 Atherosclerotic heart disease of native coronary artery without angina pectoris: Secondary | ICD-10-CM

## 2019-04-05 DIAGNOSIS — D72829 Elevated white blood cell count, unspecified: Secondary | ICD-10-CM | POA: Diagnosis not present
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
## 2019-04-17 DIAGNOSIS — H353111 Nonexudative age-related macular degeneration, right eye, early dry stage: Secondary | ICD-10-CM | POA: Diagnosis not present

## 2019-04-17 DIAGNOSIS — E119 Type 2 diabetes mellitus without complications: Secondary | ICD-10-CM | POA: Diagnosis not present

## 2019-04-17 DIAGNOSIS — H35363 Drusen (degenerative) of macula, bilateral: Secondary | ICD-10-CM | POA: Diagnosis not present

## 2019-04-17 DIAGNOSIS — Z7984 Long term (current) use of oral hypoglycemic drugs: Secondary | ICD-10-CM | POA: Diagnosis not present

## 2019-04-17 DIAGNOSIS — H353122 Nonexudative age-related macular degeneration, left eye, intermediate dry stage: Secondary | ICD-10-CM | POA: Diagnosis not present

## 2019-04-26 ENCOUNTER — Ambulatory Visit
Admission: RE | Admit: 2019-04-26 | Discharge: 2019-04-26 | Disposition: A | Discharge status: Home / Self Care | Payer: Medicare Other | Source: Ambulatory Visit | Attending: Family Medicine | Admitting: Family Medicine

## 2019-04-26 DIAGNOSIS — R161 Splenomegaly, not elsewhere classified: Secondary | ICD-10-CM | POA: Diagnosis not present

## 2019-04-26 DIAGNOSIS — K760 Fatty (change of) liver, not elsewhere classified: Secondary | ICD-10-CM | POA: Diagnosis not present

## 2019-04-26 DIAGNOSIS — K5730 Diverticulosis of large intestine without perforation or abscess without bleeding: Secondary | ICD-10-CM | POA: Diagnosis not present

## 2019-04-26 DIAGNOSIS — R1030 Lower abdominal pain, unspecified: Secondary | ICD-10-CM

## 2019-04-26 MED ORDER — IOPAMIDOL (ISOVUE-300) INJECTION 61%
100.0000 mL | Freq: Once | INTRAVENOUS | Status: AC | PRN
  Administered 2019-04-26: 100 mL via INTRAVENOUS

## 2019-04-27 NOTE — Addendum Note (Signed)
Addended by: Alysia Penna A on: 04/27/2019 01:00 PM   Modules accepted: Orders

## 2019-05-08 ENCOUNTER — Telehealth: Payer: Self-pay | Admitting: Family Medicine

## 2019-05-08 NOTE — Telephone Encounter (Signed)
Please schedule virtual visit

## 2019-05-08 NOTE — Telephone Encounter (Signed)
Patient states he might have been exposed to covid, his sons girlfriend was in ER last weekend and had seen her a couple days before, she is waiting for results. Patient does not have any symptoms, besides he did have chest pains 7/13, but does not have any pains today. Patient would like call back to see if he needs to get tested.  Call back [phone]

## 2019-05-09 NOTE — Telephone Encounter (Signed)
Per Dr. Sarajane Jews will still see in office

## 2019-05-10 ENCOUNTER — Other Ambulatory Visit: Payer: Self-pay

## 2019-05-10 ENCOUNTER — Encounter: Payer: Self-pay | Admitting: Family Medicine

## 2019-05-10 ENCOUNTER — Ambulatory Visit (INDEPENDENT_AMBULATORY_CARE_PROVIDER_SITE_OTHER): Payer: Medicare Other | Admitting: Family Medicine

## 2019-05-10 VITALS — BP 102/58 | Temp 98.9°F | Wt 167.0 lb

## 2019-05-10 DIAGNOSIS — R079 Chest pain, unspecified: Secondary | ICD-10-CM

## 2019-05-10 DIAGNOSIS — R0789 Other chest pain: Secondary | ICD-10-CM

## 2019-05-10 NOTE — Progress Notes (Signed)
   Subjective:    Patient ID: Bill [name], male    DOB: 07/16/1941, 78 y.o.   MRN: [id number]  HPI Here for an episode of chest pain that occurred 5 days ago. He has a hx of CAD but this has not been evaluated in along time. Last weekend after working vigorous out in the heat for several hours to clear tree branches he came inside the house to rest. Then he felt a moderate squeezing type pain in the center of his chest that radiated down the left arm to the hand. This then began to ease up and then stopped after about 30 minutes. He also felt SOB during this episode, but he did not feel nauseated and did not get sweaty. He did take on SL NTG tablet during this episode but it did not seem to help much. Since then he has had a few brief spells of very light chest pressure but he has resumed his normal activities. Today he feels fine.    Review of Systems  Constitutional: Negative.   Respiratory: Positive for chest tightness and shortness of breath. Negative for cough, choking, wheezing and stridor.   Cardiovascular: Positive for chest pain. Negative for palpitations and leg swelling.  Neurological: Negative.        Objective:   Physical Exam Constitutional:      General: He is not in acute distress.    Appearance: Normal appearance. He is well-developed.  Cardiovascular:     Rate and Rhythm: Normal rate and regular rhythm.     Pulses: Normal pulses.     Heart sounds: Normal heart sounds. No murmur. No gallop.      Comments: EKG today is within normal limits  Pulmonary:     Effort: Pulmonary effort is normal. No respiratory distress.     Breath sounds: Normal breath sounds. No stridor. No wheezing, rhonchi or rales.  Lymphadenopathy:     Cervical: No cervical adenopathy.  Neurological:     Mental Status: He is alert.           Assessment & Plan:  He had an episode of chest pain with SOB that was likely due to angina. He has fresh SL NTG to carry with him and use prn.  We will set up a myocardial perfusion study asap to evaluate.  Alysia Penna, MD

## 2019-05-19 ENCOUNTER — Telehealth: Payer: Self-pay | Admitting: Family Medicine

## 2019-05-19 ENCOUNTER — Telehealth (HOSPITAL_COMMUNITY): Payer: Self-pay | Admitting: *Deleted

## 2019-05-19 NOTE — Telephone Encounter (Signed)
Patient is calling requesting glucose meter and strips. He stated he didn't have any way to check his sugar. He spoke with his dietitian about needing a meter and was informed he needed to get a RX from his PCP. I called nurse triage and spoke with Larene Beach. She suggested the patient go to the drug store to get an OTC meter and strips because the office was closed and patient needed a RX. Pt was advised that if he was to have any symptoms to go to an urgent care or ER. He was also advised to stay hydrated. Patient agreed with information and will follow up with PCP on Monday.

## 2019-05-19 NOTE — Telephone Encounter (Signed)
Patient given detailed instructions per Myocardial Perfusion Study Information Sheet for the test on 05/23/19. Patient notified to arrive 15 minutes early and that it is imperative to arrive on time for appointment to keep from having the test rescheduled.  If you need to cancel or reschedule your appointment, please call the office within 24 hours of your appointment. . Patient verbalized understanding. Kirstie Peri

## 2019-05-23 ENCOUNTER — Encounter (HOSPITAL_COMMUNITY): Payer: Self-pay | Admitting: *Deleted

## 2019-05-23 ENCOUNTER — Encounter (HOSPITAL_COMMUNITY): Payer: Self-pay

## 2019-05-23 ENCOUNTER — Ambulatory Visit (HOSPITAL_COMMUNITY): Payer: Medicare Other | Attending: Cardiology

## 2019-05-23 ENCOUNTER — Other Ambulatory Visit: Payer: Self-pay

## 2019-05-23 ENCOUNTER — Other Ambulatory Visit: Payer: Self-pay | Admitting: *Deleted

## 2019-05-23 DIAGNOSIS — R079 Chest pain, unspecified: Secondary | ICD-10-CM

## 2019-05-23 DIAGNOSIS — R0789 Other chest pain: Secondary | ICD-10-CM | POA: Insufficient documentation

## 2019-05-23 MED ORDER — BLOOD GLUCOSE METER KIT: PACK | 0 refills | Status: DC

## 2019-05-23 MED ORDER — TECHNETIUM TC 99M TETROFOSMIN IV KIT
32.3000 | PACK | Freq: Once | INTRAVENOUS | Status: AC | PRN
  Administered 2019-05-23: 32.3 via INTRAVENOUS
  Filled 2019-05-23: qty 33

## 2019-05-23 NOTE — Telephone Encounter (Signed)
Please advise. Ok to send in supplies?

## 2019-05-23 NOTE — Telephone Encounter (Signed)
Patient wants to use  [facility] ([address] [place]  [address] [place] [address]  Phone: [phone] Fax: [phone]  Please notify patient once sent.

## 2019-05-23 NOTE — Telephone Encounter (Signed)
Rx has been faxed. Nothing further needed.

## 2019-05-23 NOTE — Telephone Encounter (Signed)
Ok to send in 30 days of supplies needed in Dr Sarajane Jews absence  For  blood sugar monitoring

## 2019-05-23 NOTE — Telephone Encounter (Signed)
Pt called stating the pharmacy did not have prescription for blood glucose monitor and supplies. [place] called and spoke with pharmacist, did not have fax. Attempted to give verbal order, unable to do so.  States with that type of prescription not able to give verbal.  Also states type of monitor and supplies needs to be specified under Medicare B plan.   Please review.

## 2019-05-24 ENCOUNTER — Ambulatory Visit (HOSPITAL_COMMUNITY): Payer: Medicare Other | Attending: Cardiovascular Disease

## 2019-05-24 DIAGNOSIS — R0789 Other chest pain: Secondary | ICD-10-CM | POA: Diagnosis not present

## 2019-05-24 LAB — MYOCARDIAL PERFUSION IMAGING
LV dias vol: 71 mL (ref 62–150)
LV sys vol: 32 mL
Peak HR: 94 {beats}/min
Rest HR: 70 {beats}/min
SDS: 2
SRS: 0
SSS: 2
TID: 0.95

## 2019-05-24 MED ORDER — REGADENOSON 0.4 MG/5ML IV SOLN
0.4000 mg | Freq: Once | INTRAVENOUS | Status: AC
  Administered 2019-05-24: 0.4 mg via INTRAVENOUS

## 2019-05-24 MED ORDER — TECHNETIUM TC 99M TETROFOSMIN IV KIT
31.9000 | PACK | Freq: Once | INTRAVENOUS | Status: AC | PRN
  Administered 2019-05-24: 31.9 via INTRAVENOUS
  Filled 2019-05-24: qty 32

## 2019-05-24 MED ORDER — BLOOD GLUCOSE METER KIT: PACK | 0 refills | Status: DC

## 2019-05-24 NOTE — Telephone Encounter (Signed)
Put horder in and I will sign it off

## 2019-05-24 NOTE — Telephone Encounter (Signed)
Please advise. Can you send this in electronically? The pharmacy will not accept a verbal and they are not receiving our fax.

## 2019-05-24 NOTE — Addendum Note (Signed)
Addended byShanon Ace K on: 05/24/2019 02:16 PM   Modules accepted: Orders

## 2019-05-24 NOTE — Telephone Encounter (Signed)
I have re-faxed a new prescription. Will follow up with the pharmacy.

## 2019-05-24 NOTE — Addendum Note (Signed)
Addended by: Rebecca Eaton on: 05/24/2019 10:17 AM   Modules accepted: Orders

## 2019-05-24 NOTE — Addendum Note (Signed)
Addended by: Rebecca Eaton on: 05/24/2019 04:32 PM   Modules accepted: Orders

## 2019-05-24 NOTE — Addendum Note (Signed)
Addended by: Rebecca Eaton on: 05/24/2019 10:20 AM   Modules accepted: Orders

## 2019-05-25 NOTE — Telephone Encounter (Signed)
Spoke with the patients pharmacy. They stated that they can only accept a hard copy. Will call the patient to pick this up.

## 2019-05-25 NOTE — Telephone Encounter (Signed)
Rx has been placed up front for pick up. Patient is aware.

## 2019-05-27 NOTE — Addendum Note (Signed)
Addended by: Alysia Penna A on: 05/27/2019 12:14 PM   Modules accepted: Orders

## 2019-05-31 ENCOUNTER — Telehealth: Payer: Self-pay

## 2019-05-31 NOTE — Telephone Encounter (Signed)
Please call him to give him my Result Note

## 2019-05-31 NOTE — Telephone Encounter (Signed)
Copied from [place] [id number]. Topic: General - Other >> May 31, 2019  1:34 PM Mcneil, Ja-Kwan wrote: Reason for CRM: Pt called for the results of the stress test. Pt requests call back. >> May 31, 2019  2:06 PM [name] wrote: Pt calling again for results of stress test.  Pt states if he does have a blockage he does not want to make it worse by doing the things he needs to do; ie mowing, outside. Pt states when he gets upset, he can feel his chest pain and headache.

## 2019-06-01 NOTE — Telephone Encounter (Signed)
Notes recorded by [name], MD on 05/27/2019 at 12:12 PM EDT  Shows some mild blockages of blood flow to the heart as I expected. I will refer him to Cardiology to evaluate further  Left message for patient to call back. CRM created.

## 2019-06-01 NOTE — Telephone Encounter (Signed)
Pt. Called and given results per Dr. Sarajane Jews. Verbalizes understanding.

## 2019-06-02 ENCOUNTER — Other Ambulatory Visit: Payer: Self-pay

## 2019-06-02 ENCOUNTER — Ambulatory Visit (INDEPENDENT_AMBULATORY_CARE_PROVIDER_SITE_OTHER): Payer: Medicare Other | Admitting: Cardiology

## 2019-06-02 ENCOUNTER — Encounter: Payer: Self-pay | Admitting: Cardiology

## 2019-06-02 ENCOUNTER — Ambulatory Visit (HOSPITAL_BASED_OUTPATIENT_CLINIC_OR_DEPARTMENT_OTHER)
Admission: RE | Admit: 2019-06-02 | Discharge: 2019-06-02 | Disposition: A | Discharge status: Home / Self Care | Payer: Medicare Other | Source: Ambulatory Visit | Attending: Cardiology | Admitting: Cardiology

## 2019-06-02 VITALS — BP 110/62 | HR 71 | Ht 68.0 in | Wt 171.1 lb

## 2019-06-02 DIAGNOSIS — I2510 Atherosclerotic heart disease of native coronary artery without angina pectoris: Secondary | ICD-10-CM

## 2019-06-02 DIAGNOSIS — E1165 Type 2 diabetes mellitus with hyperglycemia: Secondary | ICD-10-CM | POA: Diagnosis not present

## 2019-06-02 DIAGNOSIS — I209 Angina pectoris, unspecified: Secondary | ICD-10-CM

## 2019-06-02 DIAGNOSIS — R9439 Abnormal result of other cardiovascular function study: Secondary | ICD-10-CM

## 2019-06-02 HISTORY — DX: Abnormal result of other cardiovascular function study: R94.39

## 2019-06-02 HISTORY — DX: Angina pectoris, unspecified: I20.9

## 2019-06-02 NOTE — Addendum Note (Signed)
Addended by: Beckey Rutter on: 06/02/2019 11:10 AM   Modules accepted: Orders

## 2019-06-02 NOTE — Progress Notes (Signed)
Cardiology Office Note:    Date:  06/02/2019   ID:  Bill [name], DOB 08-24-1941, MRN [id number]  PCP:  Bill Morale, MD  Cardiologist:  Bill Lindau, MD   Referring MD: Bill Morale, MD    ASSESSMENT:    1. Coronary artery disease involving native coronary artery, angina presence unspecified, unspecified whether native or transplanted heart   2. Abnormal nuclear stress test   3. Angina pectoris ([place])   4. Type 2 diabetes mellitus with hyperglycemia, without long-term current use of insulin (HCC)    PLAN:    In order of problems listed above:  1. Angina pectoris: Patient symptoms are very concerning.  Patient mentions to me that he has had coronary angiography at [facility] hospital 2 years ago but I did not find the reports.  He was not told of any obstructive disease at that point.  He mentions to me that they have tried to have wrist access from both sides and were unsuccessful.  He would be better served with a femoral approach at this time. 2. In view of the above following recommendations were made to the patient.I discussed coronary angiography and left heart catheterization with the patient at extensive length. Procedure, benefits and potential risks were explained. Patient had multiple questions which were answered to the patient's satisfaction. Patient agreed and consented for the procedure. Further recommendations will be made based on the findings of the coronary angiography. In the interim. The patient has any significant symptoms he knows to go to the nearest emergency room. 3. Diabetes mellitus: Managed by primary care physician I had an extensive check with him about diet. 4. Further recommendations will be made based on the findings of the coronary angiography.  Sublingual nitroglycerin prescription was sent, its protocol and 911 protocol explained and the patient vocalized understanding questions were answered to the patient's satisfaction.    Medication Adjustments/Labs and Tests Ordered: Current medicines are reviewed at length with the patient today.  Concerns regarding medicines are outlined above.  No orders of the defined types were placed in this encounter.  No orders of the defined types were placed in this encounter.    History of Present Illness:    Bill [name] is a 78 y.o. male who is being seen today for the evaluation of abnormal nuclear stress test and angina at the request of Bill Morale, MD.  Patient is a pleasant 78 year old male.  He has past medical history of essential hypertension, recently diagnosed diabetes mellitus, heavy history of extensive smoking in the past.  He has quit since many years ago.  He had a stress test which was abnormal for inferior reversibility suggesting ischemia.  He complains of chest tightness radiating to the left arm upon exertion typical of angina pectoris.  No orthopnea or PND.  At the time of my evaluation, the patient is alert awake oriented and in no distress.  Past Medical History:  Diagnosis Date  . Anemia   . Anxiety   . CAD (coronary artery disease) 07/21/2016  . Cardiac arrhythmia   . COPD (chronic obstructive pulmonary disease) ([place])   . Helicobacter pylori gastritis 07/21/2016  . Penicillin allergy 07/21/2016  . Pneumonia   . Skin cancer     Past Surgical History:  Procedure Laterality Date  . APPENDECTOMY    . back fusion    . cataract surgery Left   . CHOLECYSTECTOMY    . COLONOSCOPY  2014   clear   .  SPINE SURGERY  1984   thoracic spine fusion     Current Medications: Current Meds  Medication Sig  . albuterol (PROVENTIL HFA;VENTOLIN HFA) 108 (90 Base) MCG/ACT inhaler Inhale 2 puffs into the lungs every 6 (six) hours as needed for wheezing or shortness of breath.  . aspirin 81 MG tablet Take 81 mg by mouth daily.  . b complex vitamins tablet Take 1 tablet by mouth daily.  . bisoprolol (ZEBETA) 5 MG tablet Take 0.5 tablets (2.5 mg total)  by mouth daily.  . blood glucose meter kit and supplies Dispense based on patient and insurance preference. Use up to four times daily as directed. (FOR ICD-10 E10.9, E11.9).  . Calcium Citrate-Vitamin D (CALCIUM CITRATE +D PO) Take 1 capsule by mouth daily.  . carboxymethylcellulose (REFRESH PLUS) 0.5 % SOLN 1 drop daily as needed.  . Carboxymethylcellulose Sodium (LUBRICANT EYE DROPS OP) Apply 1 drop to eye as needed.  . clotrimazole-betamethasone (LOTRISONE) cream   . cyanocobalamin 1000 MCG tablet Take 1,000 mcg by mouth daily.  . diclofenac (CATAFLAM) 50 MG tablet Take 1 tablet (50 mg total) by mouth every 8 (eight) hours as needed (pain).  . glipiZIDE (GLUCOTROL) 10 MG tablet Take 1 tablet (10 mg total) by mouth 2 (two) times daily before a meal.  . ketoconazole (NIZORAL) 2 % cream Apply 1 application topically 2 (two) times daily.  . loratadine (CLARITIN) 10 MG tablet Take 10 mg by mouth daily.  . Magnesium Oxide 500 MG CAPS Take 2 capsules by mouth daily.  . metFORMIN (GLUCOPHAGE) 500 MG tablet Take 1 tablet (500 mg total) by mouth 2 (two) times daily with a meal.  . Multiple Vitamins-Minerals (PRESERVISION AREDS 2 PO) Take 1 tablet by mouth 2 (two) times daily.  . multivitamin-lutein (OCUVITE-LUTEIN) CAPS capsule Take 1 capsule by mouth daily.  . nitroGLYCERIN (NITROSTAT) 0.4 MG SL tablet Place 1 tablet (0.4 mg total) under the tongue every 5 (five) minutes as needed for chest pain.  . Potassium 99 MG TABS Take 1 tablet by mouth daily.  . vitamin E 400 UNIT capsule Take 400 Units by mouth daily.   Current Facility-Administered Medications for the 06/02/19 encounter (Office Visit) with Bill Nadal R, MD  Medication  . 0.9 %  sodium chloride infusion     Allergies:   Aspirin, Oxycodone, Penicillins, Percocet [oxycodone-acetaminophen], Prednisone, and Ciprofloxacin   Social History   Socioeconomic History  . Marital status: Significant Other    Spouse name: Not on file  .  Number of children: 3  . Years of education: 7  . Highest education level: Not on file  Occupational History  . Occupation: retired    Employer: [employer]  Social Needs  . Financial resource strain: Not on file  . Food insecurity    Worry: Not on file    Inability: Not on file  . Transportation needs    Medical: Not on file    Non-medical: Not on file  Tobacco Use  . Smoking status: Former Smoker  . Smokeless tobacco: Never Used  Substance and Sexual Activity  . Alcohol use: No  . Drug use: No  . Sexual activity: Not on file  Lifestyle  . Physical activity    Days per week: Not on file    Minutes per session: Not on file  . Stress: Not on file  Relationships  . Social connections    Talks on phone: Not on file    Gets together: Not on file      Attends religious service: Not on file    Active member of club or organization: Not on file    Attends meetings of clubs or organizations: Not on file    Relationship status: Not on file  Other Topics Concern  . Not on file  Social History Narrative   Lives with significant other in a 2 story home.  Has 3 children.  Retired.  Education: 7th grade.      Family History: The patient's family history includes Colon cancer in his mother; Diabetes in his father; Heart disease in his brother, father, and son; Hyperlipidemia in his father; Hypertension in his father; Pancreatic cancer in his cousin; Prostate cancer in his cousin; Stroke in his father.  ROS:   Please see the history of present illness.    All other systems reviewed and are negative.  EKGs/Labs/Other Studies Reviewed:    The following studies were reviewed today: EKG reveals sinus rhythm and nonspecific ST-T changes.  Study Highlights    Nuclear stress EF: 55%.  There was no ST segment deviation noted during stress.  Findings consistent with ischemia.  This is a low risk study.  The left ventricular ejection fraction is normal (55-65%).    Small area of mild mid and basal inferior wall ischemia EF normal 55% Significant diaphragmatic attenuation and extra cardiac activity limits specificity of finding         Recent Labs: 03/28/2019: BUN 11; Creatinine, Ser 0.90; Hemoglobin 16.0; Platelets 412.0; Potassium 4.7; Sodium 127; TSH 2.67  Recent Lipid Panel    Component Value Date/Time   CHOL 183 05/20/2018 1213   TRIG 180.0 (H) 05/20/2018 1213   HDL 34.00 (L) 05/20/2018 1213   CHOLHDL 5 05/20/2018 1213   VLDL 36.0 05/20/2018 1213   LDLCALC 113 (H) 05/20/2018 1213    Physical Exam:    VS:  BP 110/62   Pulse 71   Ht 5' 8" (1.727 m)   Wt 171 lb 1.9 oz (77.6 kg)   SpO2 95%   BMI 26.02 kg/m     Wt Readings from Last 3 Encounters:  06/02/19 171 lb 1.9 oz (77.6 kg)  05/23/19 167 lb (75.8 kg)  05/10/19 167 lb (75.8 kg)     GEN: Patient is in no acute distress HEENT: Normal NECK: No JVD; No carotid bruits LYMPHATICS: No lymphadenopathy CARDIAC: S1 S2 regular, 2/6 systolic murmur at the apex. RESPIRATORY:  Clear to auscultation without rales, wheezing or rhonchi  ABDOMEN: Soft, non-tender, non-distended MUSCULOSKELETAL:  No edema; No deformity  SKIN: Warm and dry NEUROLOGIC:  Alert and oriented x 3 PSYCHIATRIC:  Normal affect    Signed, Bill Lindau, MD  06/02/2019 10:41 AM    [place]

## 2019-06-02 NOTE — Patient Instructions (Addendum)
Medication Instructions:  Your physician recommends that you continue on your current medications as directed. Please refer to the Current Medication list given to you today.  If you need a refill on your cardiac medications before your next appointment, please call your pharmacy.   Lab work: Your physician recommends that you have a BMP and CBC drawn today.  If you have labs (blood work) drawn today and your tests are completely normal, you will receive your results only by: Marland Kitchen MyChart Message (if you have MyChart) OR . A paper copy in the mail If you have any lab test that is abnormal or we need to change your treatment, we will call you to review the results.  Testing/Procedures: YOU HAVE BEEN scheduled for CVD 19 screening on 06/05/19 at 1:40 PM at [facility]) [place], [place] [address]    You had an EKG performed today.   Your physician has requested that you have a cardiac catheterization. Cardiac catheterization is used to diagnose and/or treat various heart conditions. Doctors may recommend this procedure for a number of different reasons. The most common reason is to evaluate chest pain. Chest pain can be a symptom of coronary artery disease (CAD), and cardiac catheterization can show whether plaque is narrowing or blocking your heart's arteries. This procedure is also used to evaluate the valves, as well as measure the blood flow and oxygen levels in different parts of your heart. For further information please visit [email or web address]. Please follow instruction sheet, as given.     [facility] [address] [place], [place] [address] Dept: [phone] Loc: [phone]  Bill [name]  06/02/2019  You are scheduled for a Cardiac Catheterization on Friday, August 14 with Dr. Larae Grooms.  1. Please arrive at the [facility] (Main Entrance A) at  [facility]: [address] [place], [place] [address] at 7:00 AM (This time is two hours before your procedure to ensure your preparation). Free valet parking service is available.   Special note: Every effort is made to have your procedure done on time. Please understand that emergencies sometimes delay scheduled procedures.  2. Diet: Do not eat solid foods after midnight.  The patient may have clear liquids until 5am upon the day of the procedure.  3. Labs: NONE   4. Medication instructions in preparation for your procedure:   Contrast Allergy: No   Stop taking, Bisprolol Thursday, August 13,    Do not take Diabetes Med Glucophage (Metformin) on the day of the procedure and HOLD 48 HOURS AFTER THE PROCEDURE.  On the morning of your procedure, take your Aspirin and any morning medicines NOT listed above.  You may use sips of water.  5. Plan for one night stay--bring personal belongings. 6. Bring a current list of your medications and current insurance cards. 7. You MUST have a responsible person to drive you home. 8. Someone MUST be with you the first 24 hours after you arrive home or your discharge will be delayed. 9. Please wear clothes that are easy to get on and off and wear slip-on shoes.  Thank you for allowing [place] to care for you!   -- [facility]   Follow-Up: At [facility], you and your health needs are our priority.  As part of our continuing mission to provide you with exceptional heart care, we have created designated Provider Care Teams.  These Care Teams include your primary Cardiologist (physician) and Advanced Practice Providers (APPs -  Physician Assistants and Nurse Practitioners) who all work together to provide you with the care you need, when you need it. You will need a follow up appointment in 1 months.   Any Other Special Instructions Will Be Listed Below  Coronary Angiogram A coronary angiogram is an X-ray  procedure that is used to examine the arteries in the heart. In this procedure, a dye (contrast dye) is injected through a long, thin tube (catheter). The catheter is inserted through the groin, wrist, or arm. The dye is injected into each artery, then X-rays are taken to show if there is a blockage in the arteries of the heart. This procedure can also show if you have valve disease or a disease of the aorta, and it can be used to check the overall function of your heart muscle. You may have a coronary angiogram if:  You are having chest pain, or other symptoms of angina, and you are at risk for heart disease.  You have an abnormal electrocardiogram (ECG) or stress test.  You have chest pain and heart failure.  You are having irregular heart rhythms.  You and your health care provider determine that the benefits of the test information outweigh the risks of the procedure. Let your health care provider know about:  Any allergies you have, including allergies to contrast dye.  All medicines you are taking, including vitamins, herbs, eye drops, creams, and over-the-counter medicines.  Any problems you or family members have had with anesthetic medicines.  Any blood disorders you have.  Any surgeries you have had.  History of kidney problems or kidney failure.  Any medical conditions you have.  Whether you are pregnant or may be pregnant. What are the risks? Generally, this is a safe procedure. However, problems may occur, including:  Infection.  Allergic reaction to medicines or dyes that are used.  Bleeding from the access site or other locations.  Kidney injury, especially in people with impaired kidney function.  Stroke (rare).  Heart attack (rare).  Damage to other structures or organs. What happens before the procedure? Staying hydrated Follow instructions from your health care provider about hydration, which may include:  Up to 2 hours before the procedure - you may  continue to drink clear liquids, such as water, clear fruit juice, black coffee, and plain tea. Eating and drinking restrictions Follow instructions from your health care provider about eating and drinking, which may include:  8 hours before the procedure - stop eating heavy meals or foods such as meat, fried foods, or fatty foods.  6 hours before the procedure - stop eating light meals or foods, such as toast or cereal.  2 hours before the procedure - stop drinking clear liquids. General instructions  Ask your health care provider about: ? Changing or stopping your regular medicines. This is especially important if you are taking diabetes medicines or blood thinners. ? Taking medicines such as ibuprofen. These medicines can thin your blood. Do not take these medicines before your procedure if your health care provider instructs you not to, though aspirin may be recommended prior to coronary angiograms.  Plan to have someone take you home from the hospital or clinic.  You may need to have blood tests or X-rays done. What happens during the procedure?  An IV tube will be inserted into one of your veins.  You will be given one or more of the  following: ? A medicine to help you relax (sedative). ? A medicine to numb the area where the catheter will be inserted into an artery (local anesthetic).  To reduce your risk of infection: ? Your health care team will wash or sanitize their hands. ? Your skin will be washed with soap. ? Hair may be removed from the area where the catheter will be inserted.  You will be connected to a continuous ECG monitor.  The catheter will be inserted into an artery. The location may be in your groin, in your wrist, or in the fold of your arm (near your elbow).  A type of X-ray (fluoroscopy) will be used to help guide the catheter to the opening of the blood vessel that is being examined.  A dye will be injected into the catheter, and X-rays will be taken.  The dye will help to show where any narrowing or blockages are located in the heart arteries.  Tell your health care provider if you have any chest pain or trouble breathing during the procedure.  If blockages are found, your health care provider may perform another procedure, such as inserting a coronary stent. The procedure may vary among health care providers and hospitals. What happens after the procedure?  After the procedure, you will need to keep the area still for a few hours, or for as long as told by your health care provider. If the procedure is done through the groin, you will be instructed to not bend and not cross your legs.  The insertion site will be checked frequently.  The pulse in your foot or wrist will be checked frequently.  You may have additional blood tests, X-rays, and a test that records the electrical activity of your heart (ECG).  Do not drive for 24 hours if you were given a sedative. Summary  A coronary angiogram is an X-ray procedure that is used to look into the arteries in the heart.  During the procedure, a dye (contrast dye) is injected through a long, thin tube (catheter). The catheter is inserted through the groin, wrist, or arm.  Tell your health care provider about any allergies you have, including allergies to contrast dye.  After the procedure, you will need to keep the area still for a few hours, or for as long as told by your health care provider. This information is not intended to replace advice given to you by your health care provider. Make sure you discuss any questions you have with your health care provider. Document Released: 04/18/2003 Document Revised: 09/24/2017 Document Reviewed: 07/24/2016 Elsevier Patient Education  2020 [place].   Chest X-Ray A chest X-ray is a painless test that uses radiation to create images of the structures inside of your chest. Chest X-rays are used to look for many health conditions, including  heart failure, pneumonia, tuberculosis, rib fractures, breathing disorders, and cancer. They may be used to diagnose chest pain, constant coughing, or trouble breathing. Tell a health care provider about:  Any allergies you have.  All medicines you are taking, including vitamins, herbs, eye drops, creams, and over-the-counter medicines.  Any surgeries you have had.  Any medical conditions you have.  Whether you are pregnant or may be pregnant. What are the risks? Getting a chest X-ray is a safe procedure. However, you will be exposed to a small amount of radiation. Being exposed to too much radiation over a lifetime can increase the risk of cancer. This risk is small, but it may  occur if you have many X-rays throughout your life. What happens before the procedure?  You may be asked to remove glasses, jewelry, and any other metal objects.  You will be asked to undress from the waist up. You may be given a hospital gown to wear.  You may be asked to wear a protective lead apron to protect parts of your body from radiation. What happens during the procedure?   You will be asked to stand still as each picture is taken to get the best possible images.  You will be asked to take a deep breath and hold your breath for a few seconds.  The X-ray machine will create a picture of your chest using a tiny burst of radiation. This is painless.  More pictures may be taken from other angles. Typically, one picture will be taken while you face the X-ray camera, and another picture will be taken from the side while you stand. If you cannot stand, you may be asked to lie down. The procedure may vary among health care providers and hospitals. What happens after the procedure?  The X-ray(s) will be reviewed by your health care provider or an X-ray (radiology) specialist.  It is up to you to get your test results. Ask your health care provider, or the department that is doing the test, when your  results will be ready.  Your health care provider will tell you if you need more tests or a follow-up exam. Keep all follow-up visits as told by your health care provider. This is important. Summary  A chest X-ray is a safe, painless test that is used to examine the inside of the chest, heart, and lungs.  You will need to undress from the waist up and remove jewelry and metal objects before the procedure.  You will be exposed to a small amount of radiation during the procedure.  The X-ray machine will take one or more pictures of your chest while you remain as still as possible.  Later, a health care provider or specialist will review the test results with you. This information is not intended to replace advice given to you by your health care provider. Make sure you discuss any questions you have with your health care provider. Document Released: 12/08/2016 Document Revised: 02/01/2019 Document Reviewed: 12/08/2016 Elsevier Patient Education  2020 [employer].

## 2019-06-02 NOTE — H&P (View-Only) (Signed)
Cardiology Office Note:    Date:  06/02/2019   ID:  Bill [name], DOB [birth year], MRN [id number]  PCP:  Bill Morale, MD  Cardiologist:  Bill Lindau, MD   Referring MD: Bill Morale, MD    ASSESSMENT:    1. Coronary artery disease involving native coronary artery, angina presence unspecified, unspecified whether native or transplanted heart   2. Abnormal nuclear stress test   3. Angina pectoris ([place])   4. Type 2 diabetes mellitus with hyperglycemia, without long-term current use of insulin (HCC)    PLAN:    In order of problems listed above:  1. Angina pectoris: Patient symptoms are very concerning.  Patient mentions to me that he has had coronary angiography at [facility] hospital 2 years ago but I did not find the reports.  He was not told of any obstructive disease at that point.  He mentions to me that they have tried to have wrist access from both sides and were unsuccessful.  He would be better served with a femoral approach at this time. 2. In view of the above following recommendations were made to the patient.I discussed coronary angiography and left heart catheterization with the patient at extensive length. Procedure, benefits and potential risks were explained. Patient had multiple questions which were answered to the patient's satisfaction. Patient agreed and consented for the procedure. Further recommendations will be made based on the findings of the coronary angiography. In the interim. The patient has any significant symptoms he knows to go to the nearest emergency room. 3. Diabetes mellitus: Managed by primary care physician I had an extensive check with him about diet. 4. Further recommendations will be made based on the findings of the coronary angiography.  Sublingual nitroglycerin prescription was sent, its protocol and 911 protocol explained and the patient vocalized understanding questions were answered to the patient's satisfaction.    Medication Adjustments/Labs and Tests Ordered: Current medicines are reviewed at length with the patient today.  Concerns regarding medicines are outlined above.  No orders of the defined types were placed in this encounter.  No orders of the defined types were placed in this encounter.    History of Present Illness:    Bill [name] is a 78 y.o. male who is being seen today for the evaluation of abnormal nuclear stress test and angina at the request of Bill Morale, MD.  Patient is a pleasant 78 year old male.  He has past medical history of essential hypertension, recently diagnosed diabetes mellitus, heavy history of extensive smoking in the past.  He has quit since many years ago.  He had a stress test which was abnormal for inferior reversibility suggesting ischemia.  He complains of chest tightness radiating to the left arm upon exertion typical of angina pectoris.  No orthopnea or PND.  At the time of my evaluation, the patient is alert awake oriented and in no distress.  Past Medical History:  Diagnosis Date  . Anemia   . Anxiety   . CAD (coronary artery disease) 07/21/2016  . Cardiac arrhythmia   . COPD (chronic obstructive pulmonary disease) ([place])   . Helicobacter pylori gastritis 07/21/2016  . Penicillin allergy 07/21/2016  . Pneumonia   . Skin cancer     Past Surgical History:  Procedure Laterality Date  . APPENDECTOMY    . back fusion    . cataract surgery Left   . CHOLECYSTECTOMY    . COLONOSCOPY  2014   clear   .  SPINE SURGERY  1984   thoracic spine fusion     Current Medications: Current Meds  Medication Sig  . albuterol (PROVENTIL HFA;VENTOLIN HFA) 108 (90 Base) MCG/ACT inhaler Inhale 2 puffs into the lungs every 6 (six) hours as needed for wheezing or shortness of breath.  . aspirin 81 MG tablet Take 81 mg by mouth daily.  . b complex vitamins tablet Take 1 tablet by mouth daily.  . bisoprolol (ZEBETA) 5 MG tablet Take 0.5 tablets (2.5 mg total)  by mouth daily.  . blood glucose meter kit and supplies Dispense based on patient and insurance preference. Use up to four times daily as directed. (FOR ICD-10 E10.9, E11.9).  . Calcium Citrate-Vitamin D (CALCIUM CITRATE +D PO) Take 1 capsule by mouth daily.  . carboxymethylcellulose (REFRESH PLUS) 0.5 % SOLN 1 drop daily as needed.  . Carboxymethylcellulose Sodium (LUBRICANT EYE DROPS OP) Apply 1 drop to eye as needed.  . clotrimazole-betamethasone (LOTRISONE) cream   . cyanocobalamin 1000 MCG tablet Take 1,000 mcg by mouth daily.  . diclofenac (CATAFLAM) 50 MG tablet Take 1 tablet (50 mg total) by mouth every 8 (eight) hours as needed (pain).  . glipiZIDE (GLUCOTROL) 10 MG tablet Take 1 tablet (10 mg total) by mouth 2 (two) times daily before a meal.  . ketoconazole (NIZORAL) 2 % cream Apply 1 application topically 2 (two) times daily.  . loratadine (CLARITIN) 10 MG tablet Take 10 mg by mouth daily.  . Magnesium Oxide 500 MG CAPS Take 2 capsules by mouth daily.  . metFORMIN (GLUCOPHAGE) 500 MG tablet Take 1 tablet (500 mg total) by mouth 2 (two) times daily with a meal.  . Multiple Vitamins-Minerals (PRESERVISION AREDS 2 PO) Take 1 tablet by mouth 2 (two) times daily.  . multivitamin-lutein (OCUVITE-LUTEIN) CAPS capsule Take 1 capsule by mouth daily.  . nitroGLYCERIN (NITROSTAT) 0.4 MG SL tablet Place 1 tablet (0.4 mg total) under the tongue every 5 (five) minutes as needed for chest pain.  . Potassium 99 MG TABS Take 1 tablet by mouth daily.  . vitamin E 400 UNIT capsule Take 400 Units by mouth daily.   Current Facility-Administered Medications for the 06/02/19 encounter (Office Visit) with ,  R, MD  Medication  . 0.9 %  sodium chloride infusion     Allergies:   Aspirin, Oxycodone, Penicillins, Percocet [oxycodone-acetaminophen], Prednisone, and Ciprofloxacin   Social History   Socioeconomic History  . Marital status: Significant Other    Spouse name: Not on file  .  Number of children: 3  . Years of education: 7  . Highest education level: Not on file  Occupational History  . Occupation: retired    Employer: [employer]  Social Needs  . Financial resource strain: Not on file  . Food insecurity    Worry: Not on file    Inability: Not on file  . Transportation needs    Medical: Not on file    Non-medical: Not on file  Tobacco Use  . Smoking status: Former Smoker  . Smokeless tobacco: Never Used  Substance and Sexual Activity  . Alcohol use: No  . Drug use: No  . Sexual activity: Not on file  Lifestyle  . Physical activity    Days per week: Not on file    Minutes per session: Not on file  . Stress: Not on file  Relationships  . Social connections    Talks on phone: Not on file    Gets together: Not on file      Attends religious service: Not on file    Active member of club or organization: Not on file    Attends meetings of clubs or organizations: Not on file    Relationship status: Not on file  Other Topics Concern  . Not on file  Social History Narrative   Lives with significant other in a 2 story home.  Has 3 children.  Retired.  Education: 7th grade.      Family History: The patient's family history includes Colon cancer in his mother; Diabetes in his father; Heart disease in his brother, father, and son; Hyperlipidemia in his father; Hypertension in his father; Pancreatic cancer in his cousin; Prostate cancer in his cousin; Stroke in his father.  ROS:   Please see the history of present illness.    All other systems reviewed and are negative.  EKGs/Labs/Other Studies Reviewed:    The following studies were reviewed today: EKG reveals sinus rhythm and nonspecific ST-T changes.  Study Highlights    Nuclear stress EF: 55%.  There was no ST segment deviation noted during stress.  Findings consistent with ischemia.  This is a low risk study.  The left ventricular ejection fraction is normal (55-65%).    Small area of mild mid and basal inferior wall ischemia EF normal 55% Significant diaphragmatic attenuation and extra cardiac activity limits specificity of finding         Recent Labs: 03/28/2019: BUN 11; Creatinine, Ser 0.90; Hemoglobin 16.0; Platelets 412.0; Potassium 4.7; Sodium 127; TSH 2.67  Recent Lipid Panel    Component Value Date/Time   CHOL 183 05/20/2018 1213   TRIG 180.0 (H) 05/20/2018 1213   HDL 34.00 (L) 05/20/2018 1213   CHOLHDL 5 05/20/2018 1213   VLDL 36.0 05/20/2018 1213   LDLCALC 113 (H) 05/20/2018 1213    Physical Exam:    VS:  BP 110/62   Pulse 71   Ht 5' 8" (1.727 m)   Wt 171 lb 1.9 oz (77.6 kg)   SpO2 95%   BMI 26.02 kg/m     Wt Readings from Last 3 Encounters:  06/02/19 171 lb 1.9 oz (77.6 kg)  05/23/19 167 lb (75.8 kg)  05/10/19 167 lb (75.8 kg)     GEN: Patient is in no acute distress HEENT: Normal NECK: No JVD; No carotid bruits LYMPHATICS: No lymphadenopathy CARDIAC: S1 S2 regular, 2/6 systolic murmur at the apex. RESPIRATORY:  Clear to auscultation without rales, wheezing or rhonchi  ABDOMEN: Soft, non-tender, non-distended MUSCULOSKELETAL:  No edema; No deformity  SKIN: Warm and dry NEUROLOGIC:  Alert and oriented x 3 PSYCHIATRIC:  Normal affect    Signed, Bill Lindau, MD  06/02/2019 10:41 AM    [place]

## 2019-06-03 LAB — BASIC METABOLIC PANEL
BUN/Creatinine Ratio: 13 (ref 10–24)
BUN: 11 mg/dL (ref 8–27)
CO2: 23 mmol/L (ref 20–29)
Calcium: 9.5 mg/dL (ref 8.6–10.2)
Chloride: 105 mmol/L (ref 96–106)
Creatinine, Ser: 0.87 mg/dL (ref 0.76–1.27)
GFR calc Af Amer: 97 mL/min/{1.73_m2} (ref >59)
GFR calc non Af Amer: 84 mL/min/{1.73_m2} (ref >59)
Glucose: 178 mg/dL — ABNORMAL HIGH (ref 65–99)
Potassium: 4.4 mmol/L (ref 3.5–5.2)
Sodium: 140 mmol/L (ref 134–144)

## 2019-06-03 LAB — CBC
Hematocrit: 42 % (ref 37.5–51.0)
Hemoglobin: 14.2 g/dL (ref 13.0–17.7)
MCH: 31.1 pg (ref 26.6–33.0)
MCHC: 33.8 g/dL (ref 31.5–35.7)
MCV: 92 fL (ref 79–97)
Platelets: 355 10*3/uL (ref 150–450)
RBC: 4.57 x10E6/uL (ref 4.14–5.80)
RDW: 16.3 % — ABNORMAL HIGH (ref 11.6–15.4)
WBC: 14.8 10*3/uL — ABNORMAL HIGH (ref 3.4–10.8)

## 2019-06-05 ENCOUNTER — Telehealth: Payer: Self-pay

## 2019-06-05 ENCOUNTER — Other Ambulatory Visit (HOSPITAL_COMMUNITY)
Admission: RE | Admit: 2019-06-05 | Discharge: 2019-06-05 | Disposition: A | Discharge status: Home / Self Care | Payer: Medicare Other | Source: Ambulatory Visit | Attending: Interventional Cardiology | Admitting: Interventional Cardiology

## 2019-06-05 DIAGNOSIS — Z01812 Encounter for preprocedural laboratory examination: Secondary | ICD-10-CM | POA: Diagnosis not present

## 2019-06-05 DIAGNOSIS — Z20828 Contact with and (suspected) exposure to other viral communicable diseases: Secondary | ICD-10-CM | POA: Insufficient documentation

## 2019-06-05 LAB — SARS CORONAVIRUS 2 (TAT 6-24 HRS): SARS Coronavirus 2: NEGATIVE

## 2019-06-05 NOTE — Telephone Encounter (Signed)
Phoned patient, left voicemail (per DPR) that CXR normal and Dr. Geraldo Pitter will discuss in detail with him at next office visit.

## 2019-06-07 ENCOUNTER — Telehealth: Payer: Self-pay | Admitting: Cardiology

## 2019-06-07 NOTE — Telephone Encounter (Signed)
LMOM to call and schedule his new patient appointment per Amparo Bristol

## 2019-06-08 ENCOUNTER — Telehealth: Payer: Self-pay | Admitting: *Deleted

## 2019-06-08 NOTE — Telephone Encounter (Addendum)
Pt contacted pre-catheterization scheduled at [facility] for: Friday June 09, 2019 9 AM Verified arrival time and place: [facility]) at: 7 AM   No solid food after midnight prior to cath, clear liquids until 5 AM day of procedure. Contrast allergy: no  Hold: Glipizide-AM of procedure. Metformin-day of procedure and 48 hours post procedure.  Except hold medications AM meds can be  taken pre-cath with sip of water including: ASA 81 mg   Confirmed patient has responsible person to drive home post procedure and observe 24 hours after arriving home-yes  Due to Covid-19 pandemic, only one support person will be allowed with patient. Must be the same support person for that patient's entire stay, will be screened and required to wear a mask.   Patients are required to wear a mask when they enter the hospital.      COVID-19 Pre-Screening Questions:  . In the past 7 to 10 days have you had a cough,  shortness of breath, headache, congestion, fever (100 or greater) body aches, chills, sore throat, or sudden loss of taste or sense of smell? no . Have you been around anyone with known Covid 19? no . Have you been around anyone who is awaiting Covid 19 test results in the past 7 to 10 days? no . Have you been around anyone who has been exposed to Covid 19, or has mentioned symptoms of Covid 19 within the past 7 to 10 days? no    I reviewed procedure/mask/visitor instructions , Covid-19 screening questions with patient, he verbalized understanding, thanked me for call.   WBC-14.8 -pt denies any s/s of infection, including fever-CXR 06/02/19-no acute abnormalities of lungs.

## 2019-06-09 ENCOUNTER — Encounter (HOSPITAL_COMMUNITY)
Admission: RE | Disposition: A | Discharge status: Home / Self Care | Payer: Medicare Other | Source: Home / Self Care | Attending: Interventional Cardiology

## 2019-06-09 ENCOUNTER — Ambulatory Visit (HOSPITAL_COMMUNITY)
Admission: RE | Admit: 2019-06-09 | Discharge: 2019-06-09 | Disposition: A | Discharge status: Home / Self Care | Payer: Medicare Other | Attending: Interventional Cardiology | Admitting: Interventional Cardiology

## 2019-06-09 ENCOUNTER — Other Ambulatory Visit: Payer: Self-pay

## 2019-06-09 ENCOUNTER — Telehealth: Payer: Self-pay | Admitting: Student

## 2019-06-09 ENCOUNTER — Encounter (HOSPITAL_COMMUNITY): Payer: Self-pay | Admitting: Interventional Cardiology

## 2019-06-09 DIAGNOSIS — Z880 Allergy status to penicillin: Secondary | ICD-10-CM | POA: Diagnosis not present

## 2019-06-09 DIAGNOSIS — Z8249 Family history of ischemic heart disease and other diseases of the circulatory system: Secondary | ICD-10-CM | POA: Diagnosis not present

## 2019-06-09 DIAGNOSIS — I10 Essential (primary) hypertension: Secondary | ICD-10-CM | POA: Diagnosis not present

## 2019-06-09 DIAGNOSIS — I209 Angina pectoris, unspecified: Secondary | ICD-10-CM

## 2019-06-09 DIAGNOSIS — Z881 Allergy status to other antibiotic agents status: Secondary | ICD-10-CM | POA: Insufficient documentation

## 2019-06-09 DIAGNOSIS — Z886 Allergy status to analgesic agent status: Secondary | ICD-10-CM | POA: Diagnosis not present

## 2019-06-09 DIAGNOSIS — Z885 Allergy status to narcotic agent status: Secondary | ICD-10-CM | POA: Diagnosis not present

## 2019-06-09 DIAGNOSIS — E1165 Type 2 diabetes mellitus with hyperglycemia: Secondary | ICD-10-CM

## 2019-06-09 DIAGNOSIS — Z7984 Long term (current) use of oral hypoglycemic drugs: Secondary | ICD-10-CM | POA: Insufficient documentation

## 2019-06-09 DIAGNOSIS — Z823 Family history of stroke: Secondary | ICD-10-CM | POA: Insufficient documentation

## 2019-06-09 DIAGNOSIS — R9439 Abnormal result of other cardiovascular function study: Secondary | ICD-10-CM | POA: Diagnosis not present

## 2019-06-09 DIAGNOSIS — Z79899 Other long term (current) drug therapy: Secondary | ICD-10-CM | POA: Insufficient documentation

## 2019-06-09 DIAGNOSIS — I2510 Atherosclerotic heart disease of native coronary artery without angina pectoris: Secondary | ICD-10-CM

## 2019-06-09 DIAGNOSIS — Z7982 Long term (current) use of aspirin: Secondary | ICD-10-CM | POA: Diagnosis not present

## 2019-06-09 DIAGNOSIS — Z87891 Personal history of nicotine dependence: Secondary | ICD-10-CM | POA: Diagnosis not present

## 2019-06-09 DIAGNOSIS — J449 Chronic obstructive pulmonary disease, unspecified: Secondary | ICD-10-CM | POA: Insufficient documentation

## 2019-06-09 DIAGNOSIS — Z888 Allergy status to other drugs, medicaments and biological substances status: Secondary | ICD-10-CM | POA: Diagnosis not present

## 2019-06-09 HISTORY — PX: LEFT HEART CATH AND CORONARY ANGIOGRAPHY: CATH118249

## 2019-06-09 LAB — GLUCOSE, CAPILLARY: Glucose-Capillary: 99 mg/dL (ref 70–99)

## 2019-06-09 SURGERY — LEFT HEART CATH AND CORONARY ANGIOGRAPHY
Anesthesia: LOCAL

## 2019-06-09 MED ORDER — HEPARIN (PORCINE) IN NACL 1000-0.9 UT/500ML-% IV SOLN
INTRAVENOUS | Status: AC
  Filled 2019-06-09: qty 1000

## 2019-06-09 MED ORDER — HEPARIN SODIUM (PORCINE) 1000 UNIT/ML IJ SOLN
INTRAMUSCULAR | Status: AC
  Filled 2019-06-09: qty 1

## 2019-06-09 MED ORDER — SODIUM CHLORIDE 0.9% FLUSH: 3.0000 mL | INTRAVENOUS | Status: DC | PRN

## 2019-06-09 MED ORDER — ONDANSETRON HCL 4 MG/2ML IJ SOLN: 4.0000 mg | Freq: Four times a day (QID) | INTRAMUSCULAR | Status: DC | PRN

## 2019-06-09 MED ORDER — IOHEXOL 350 MG/ML SOLN
INTRAVENOUS | Status: DC | PRN
  Administered 2019-06-09: 75 mL via INTRAVENOUS

## 2019-06-09 MED ORDER — SODIUM CHLORIDE 0.9 % WEIGHT BASED INFUSION: 1.0000 mL/kg/h | INTRAVENOUS | Status: DC

## 2019-06-09 MED ORDER — SODIUM CHLORIDE 0.9 % WEIGHT BASED INFUSION
3.0000 mL/kg/h | INTRAVENOUS | Status: AC
  Administered 2019-06-09: 3 mL/kg/h via INTRAVENOUS

## 2019-06-09 MED ORDER — MIDAZOLAM HCL 2 MG/2ML IJ SOLN
INTRAMUSCULAR | Status: DC | PRN
  Administered 2019-06-09: 2 mg via INTRAVENOUS

## 2019-06-09 MED ORDER — SODIUM CHLORIDE 0.9% FLUSH: 3.0000 mL | Freq: Two times a day (BID) | INTRAVENOUS | Status: DC

## 2019-06-09 MED ORDER — HEPARIN (PORCINE) IN NACL 1000-0.9 UT/500ML-% IV SOLN
INTRAVENOUS | Status: DC | PRN
  Administered 2019-06-09 (×2): 500 mL

## 2019-06-09 MED ORDER — SODIUM CHLORIDE 0.9 % IV SOLN: 250.0000 mL | INTRAVENOUS | Status: DC | PRN

## 2019-06-09 MED ORDER — LABETALOL HCL 5 MG/ML IV SOLN: 10.0000 mg | INTRAVENOUS | Status: DC | PRN

## 2019-06-09 MED ORDER — ACETAMINOPHEN 325 MG PO TABS: 650.0000 mg | ORAL_TABLET | ORAL | Status: DC | PRN

## 2019-06-09 MED ORDER — HYDRALAZINE HCL 20 MG/ML IJ SOLN: 10.0000 mg | INTRAMUSCULAR | Status: DC | PRN

## 2019-06-09 MED ORDER — LIDOCAINE HCL (PF) 1 % IJ SOLN
INTRAMUSCULAR | Status: DC | PRN
  Administered 2019-06-09: 20 mL

## 2019-06-09 MED ORDER — ASPIRIN 81 MG PO CHEW: 81.0000 mg | CHEWABLE_TABLET | ORAL | Status: DC

## 2019-06-09 MED ORDER — FENTANYL CITRATE (PF) 100 MCG/2ML IJ SOLN
INTRAMUSCULAR | Status: DC | PRN
  Administered 2019-06-09: 25 ug via INTRAVENOUS

## 2019-06-09 MED ORDER — MIDAZOLAM HCL 2 MG/2ML IJ SOLN
INTRAMUSCULAR | Status: AC
  Filled 2019-06-09: qty 2

## 2019-06-09 MED ORDER — SODIUM CHLORIDE 0.9 % IV SOLN: INTRAVENOUS | Status: DC

## 2019-06-09 MED ORDER — FENTANYL CITRATE (PF) 100 MCG/2ML IJ SOLN
INTRAMUSCULAR | Status: AC
  Filled 2019-06-09: qty 2

## 2019-06-09 MED ORDER — LIDOCAINE HCL (PF) 1 % IJ SOLN
INTRAMUSCULAR | Status: AC
  Filled 2019-06-09: qty 30

## 2019-06-09 SURGICAL SUPPLY — 13 items
CATH INFINITI 5FR MULTPACK ANG (CATHETERS) ×1 IMPLANT
CLOSURE MYNX CONTROL 5F (Vascular Products) ×1 IMPLANT
COVER PRB 48X5XTLSCP FOLD TPE (BAG) IMPLANT
COVER PROBE 5X48 (BAG) ×1
GLIDESHEATH SLEND SS 6F .021 (SHEATH) IMPLANT
GUIDEWIRE INQWIRE 1.5J.035X260 (WIRE) IMPLANT
INQWIRE 1.5J .035X260CM (WIRE)
KIT HEART LEFT (KITS) ×2 IMPLANT
PACK CARDIAC CATHETERIZATION (CUSTOM PROCEDURE TRAY) ×2 IMPLANT
SHEATH PINNACLE 5F 10CM (SHEATH) ×1 IMPLANT
TRANSDUCER W/STOPCOCK (MISCELLANEOUS) ×2 IMPLANT
TUBING CIL FLEX 10 FLL-RA (TUBING) ×2 IMPLANT
WIRE EMERALD 3MM-J .035X150CM (WIRE) ×1 IMPLANT

## 2019-06-09 NOTE — Telephone Encounter (Signed)
   Patient called answering service with question about femoral cath site from outpatient cardiac catheterization today. Patient went to use the restroom this evening and noticed cath site was a little painful, swollen, and red and possibly a little warm to the touch. Pain manageable at this time. I asked if area felt hard to the touch but patient states he does not want to touch it because he does not want it to hurt. Discussed with Dr. Irish Lack who performed procedure. OK to continue monitoring at home for now. Emphasized the importance of resting for the remainder of the weekend and not doing anything active or strenuous. Per Dr. Irish Lack, patient can ice the area or use a heating pad whichever helps more. Recommended over-the-counter Tylenol as needed for pain. Emphasized the importance of continuing to monitor cath site area over the weekend and call us if pain, swelling, or redness worsens or if the area because very warm to the touch. Patient voiced understanding, agreed to plan, and thanked me for calling.  Darreld Mclean, PA-C 06/09/2019 6:36 PM

## 2019-06-09 NOTE — Interval H&P Note (Signed)
Cath Lab Visit (complete for each Cath Lab visit)  Clinical Evaluation Leading to the Procedure:   ACS: No.  Non-ACS:    Anginal Classification: CCS III  Anti-ischemic medical therapy: Minimal Therapy (1 class of medications)  Non-Invasive Test Results: Intermediate-risk stress test findings: cardiac mortality 1-3%/year  Prior CABG: No previous CABG      History and Physical Interval Note:  06/09/2019 8:08 AM  Joshau Alvino Blood  has presented today for surgery, with the diagnosis of Angina.  The various methods of treatment have been discussed with the patient and family. After consideration of risks, benefits and other options for treatment, the patient has consented to  Procedure(s): LEFT HEART CATH AND CORONARY ANGIOGRAPHY (N/A) as a surgical intervention.  The patient's history has been reviewed, patient examined, no change in status, stable for surgery.  I have reviewed the patient's chart and labs.  Questions were answered to the patient's satisfaction.     Larae Grooms

## 2019-06-09 NOTE — Discharge Instructions (Signed)

## 2019-06-13 ENCOUNTER — Telehealth: Payer: Self-pay | Admitting: Interventional Cardiology

## 2019-06-13 ENCOUNTER — Ambulatory Visit: Payer: Medicare Other | Admitting: Cardiology

## 2019-06-13 NOTE — Telephone Encounter (Signed)
New Message     Pt is calling because he does not remember the after care instructions     Please call

## 2019-06-14 ENCOUNTER — Ambulatory Visit: Payer: Medicare Other | Admitting: Dietician

## 2019-06-14 NOTE — Telephone Encounter (Signed)
New Message    Patient states he has a knot as big as his thumb at his surgical site where the procedure was done.  Patient also states it has turned purple.  Please call patient back to advise.

## 2019-06-14 NOTE — Telephone Encounter (Signed)
Spoke with [name]. He underwent cardiac cath via R groin 06/09/19. Note previous encounter 06/09/19 with Bill Rives, PA where he described his R groin as "painful, swollen, red and possibly a little warm to the touch".   He has been treating with over the counter tylenol and ice intermittently. Tells me it is still sore, but no increased pain.  He tells me that he removed his dressing yesterday 06/13/19 and noticed a "knot" the size of a quarter. Reports some bruising. Denies bleeding, not warm to touch.   Encouraged to continue to use tylenol as needed. Encouraged to continue to rest and not perform heavy lifting.   He is concerned about what symptoms he would need to present to the ED. Informed him if the cath site starts to bleed would be the circumstance to present to the ED. Reassurance provided that his description is consistent with a small hematoma which will self resolve.   He is somewhat frustrated by the length of time it took him to get a call back - told him I will call to check on him tomorrow. Unfortunately not doing nurse visits at this time. If needed, will place him in my clinic hours for Monday.   Loel Dubonnet, NP

## 2019-06-15 NOTE — Telephone Encounter (Signed)
Called and spoke with Bill [name]. Tells me the R groin "knot" is the same size. No bleeding, no warmth. Bruising is the same as it was yesterday. We discussed watchful waiting - he verbalized understanding to call our office if the "knot" begins to get larger. Sounds like small, stable hematoma. Encouraged him to avoid strenuous activity, but that walking was fine. Offered him an appointment for Monday, and he politely declined but will call if he changes his mind.   Loel Dubonnet, NP

## 2019-06-16 ENCOUNTER — Ambulatory Visit (INDEPENDENT_AMBULATORY_CARE_PROVIDER_SITE_OTHER): Payer: Medicare Other | Admitting: Cardiology

## 2019-06-16 ENCOUNTER — Ambulatory Visit (HOSPITAL_COMMUNITY)
Admission: RE | Admit: 2019-06-16 | Discharge: 2019-06-16 | Disposition: A | Discharge status: Home / Self Care | Payer: Medicare Other | Source: Ambulatory Visit | Attending: Cardiology | Admitting: Cardiology

## 2019-06-16 ENCOUNTER — Other Ambulatory Visit: Payer: Self-pay

## 2019-06-16 ENCOUNTER — Encounter: Payer: Self-pay | Admitting: Cardiology

## 2019-06-16 VITALS — BP 124/68 | HR 58 | Ht 67.0 in | Wt 170.0 lb

## 2019-06-16 DIAGNOSIS — I729 Aneurysm of unspecified site: Secondary | ICD-10-CM

## 2019-06-16 DIAGNOSIS — T81718A Complication of other artery following a procedure, not elsewhere classified, initial encounter: Secondary | ICD-10-CM

## 2019-06-16 DIAGNOSIS — R1031 Right lower quadrant pain: Secondary | ICD-10-CM

## 2019-06-16 DIAGNOSIS — E1165 Type 2 diabetes mellitus with hyperglycemia: Secondary | ICD-10-CM

## 2019-06-16 DIAGNOSIS — R9439 Abnormal result of other cardiovascular function study: Secondary | ICD-10-CM

## 2019-06-16 DIAGNOSIS — M79604 Pain in right leg: Secondary | ICD-10-CM

## 2019-06-16 HISTORY — DX: Right lower quadrant pain: R10.31

## 2019-06-16 NOTE — Progress Notes (Signed)
Limited right lower extremity arterial duplex completed. Refer to "CV Proc" under chart review to view preliminary results.  06/16/2019 6:10 PM Maudry Mayhew, MHA, RVT, RDCS, RDMS

## 2019-06-16 NOTE — Progress Notes (Signed)
Cardiology Office Note:    Date:  06/16/2019   ID:  Bill [name], DOB 03-Oct-1941, MRN [id number]  PCP:  [name], MD  Cardiologist:  Jenne Campus, MD    Referring MD: [name], MD   No chief complaint on file. Pain in the right groin  History of Present Illness:    Bill [name] is a 78 y.o. male about a week ago he had cardiac catheterization done he did well he did not find any obstructive coronary artery disease.  After that he noted some knot in his right groin.  It was not painful however a few days ago he noted that this knot became somewhat larger and became more painful.  Is very concerned about this especially in view of the fact that his son had cardiac catheterization and not having significant complication from groin that required intervention.  He requested to be seen today.  He is doing well otherwise.  He actually walked to the room without limping.  Past Medical History:  Diagnosis Date  . Anemia   . Anxiety   . CAD (coronary artery disease) 07/21/2016  . Cardiac arrhythmia   . COPD (chronic obstructive pulmonary disease) ([place])   . Helicobacter pylori gastritis 07/21/2016  . Penicillin allergy 07/21/2016  . Pneumonia   . Skin cancer     Past Surgical History:  Procedure Laterality Date  . APPENDECTOMY    . back fusion    . cataract surgery Left   . CHOLECYSTECTOMY    . COLONOSCOPY  2014   clear   . LEFT HEART CATH AND CORONARY ANGIOGRAPHY N/A 06/09/2019   Procedure: LEFT HEART CATH AND CORONARY ANGIOGRAPHY;  Surgeon: Jettie Booze, MD;  Location: [place] [facility];  Service: Cardiovascular;  Laterality: N/A;  . SPINE SURGERY  1984   thoracic spine fusion     Current Medications: Current Meds  Medication Sig  . albuterol (PROVENTIL HFA;VENTOLIN HFA) 108 (90 Base) MCG/ACT inhaler Inhale 2 puffs into the lungs every 6 (six) hours as needed for wheezing or shortness of breath.  Marland Kitchen aspirin 81 MG tablet Take 81 mg by mouth  daily.  Marland Kitchen b complex vitamins tablet Take 1 tablet by mouth daily.  . bisoprolol (ZEBETA) 5 MG tablet Take 0.5 tablets (2.5 mg total) by mouth daily. (Patient taking differently: Take 2.5 mg by mouth every Monday, Wednesday, and Friday. )  . blood glucose meter kit and supplies Dispense based on patient and insurance preference. Use up to four times daily as directed. (FOR ICD-10 E10.9, E11.9).  . Calcium Citrate-Vitamin D (CALCIUM CITRATE +D PO) Take 1 tablet by mouth daily.   . carboxymethylcellulose (REFRESH PLUS) 0.5 % SOLN Place 1 drop into both eyes daily as needed (dry eyes).   . Carboxymethylcellulose Sodium (LUBRICANT EYE DROPS OP) Apply 1 drop to eye daily as needed (dry eyes).   . cyanocobalamin 1000 MCG tablet Take 1,000 mcg by mouth daily.  Marland Kitchen glipiZIDE (GLUCOTROL) 10 MG tablet Take 1 tablet (10 mg total) by mouth 2 (two) times daily before a meal.  . loratadine (CLARITIN) 10 MG tablet Take 10 mg by mouth daily.  . magnesium gluconate (MAGONATE) 500 MG tablet Take 1,000 mg by mouth daily.  . Magnesium Oxide 500 MG CAPS Take 2 capsules by mouth daily.  . metFORMIN (GLUCOPHAGE) 500 MG tablet Take 1 tablet (500 mg total) by mouth 2 (two) times daily with a meal.  . Multiple Vitamin (MULTIVITAMIN) tablet Take 1  tablet by mouth daily.  . Multiple Vitamins-Minerals (PRESERVISION AREDS 2 PO) Take 1 tablet by mouth 2 (two) times daily.  . nitroGLYCERIN (NITROSTAT) 0.4 MG SL tablet Place 1 tablet (0.4 mg total) under the tongue every 5 (five) minutes as needed for chest pain.  Marland Kitchen Potassium 99 MG TABS Take 1 tablet by mouth daily.  . vitamin C (ASCORBIC ACID) 500 MG tablet Take 500 mg by mouth daily.  . vitamin E 400 UNIT capsule Take 400 Units by mouth daily.   Current Facility-Administered Medications for the 06/16/19 encounter (Office Visit) with Park Liter, MD  Medication  . 0.9 %  sodium chloride infusion     Allergies:   Aspirin, Oxycodone, Penicillins, Percocet  [oxycodone-acetaminophen], Ciprofloxacin, and Prednisone   Social History   Socioeconomic History  . Marital status: Significant Other    Spouse name: Not on file  . Number of children: 3  . Years of education: 7  . Highest education level: Not on file  Occupational History  . Occupation: retired    Fish farm manager: [employer].  Social Needs  . Financial resource strain: Not on file  . Food insecurity    Worry: Not on file    Inability: Not on file  . Transportation needs    Medical: Not on file    Non-medical: Not on file  Tobacco Use  . Smoking status: Former Research scientist (life sciences)  . Smokeless tobacco: Never Used  Substance and Sexual Activity  . Alcohol use: No  . Drug use: No  . Sexual activity: Not on file  Lifestyle  . Physical activity    Days per week: Not on file    Minutes per session: Not on file  . Stress: Not on file  Relationships  . Social Herbalist on phone: Not on file    Gets together: Not on file    Attends religious service: Not on file    Active member of club or organization: Not on file    Attends meetings of clubs or organizations: Not on file    Relationship status: Not on file  Other Topics Concern  . Not on file  Social History Narrative   Lives with significant other in a 2 story home.  Has 3 children.  Retired.  Education: 7th grade.      Family History: The patient's family history includes Colon cancer in his mother; Diabetes in his father; Heart disease in his brother, father, and son; Hyperlipidemia in his father; Hypertension in his father; Pancreatic cancer in his cousin; Prostate cancer in his cousin; Stroke in his father. ROS:   Please see the history of present illness.    All 14 point review of systems negative except as described per history of present illness  EKGs/Labs/Other Studies Reviewed:      Recent Labs: 03/28/2019: TSH 2.67 06/02/2019: BUN 11; Creatinine, Ser 0.87; Hemoglobin 14.2; Platelets 355; Potassium 4.4;  Sodium 140  Recent Lipid Panel    Component Value Date/Time   CHOL 183 05/20/2018 1213   TRIG 180.0 (H) 05/20/2018 1213   HDL 34.00 (L) 05/20/2018 1213   CHOLHDL 5 05/20/2018 1213   VLDL 36.0 05/20/2018 1213   LDLCALC 113 (H) 05/20/2018 1213    Physical Exam:    VS:  BP 124/68 (BP Location: Right Arm, Patient Position: Sitting)   Pulse (!) 58   Ht 5' 7"  (1.702 m)   Wt 170 lb (77.1 kg)   SpO2 96%   BMI 26.63  kg/m     Wt Readings from Last 3 Encounters:  06/16/19 170 lb (77.1 kg)  06/09/19 171 lb (77.6 kg)  06/02/19 171 lb 1.9 oz (77.6 kg)     GEN:  Well nourished, well developed in no acute distress HEENT: Normal NECK: No JVD; No carotid bruits LYMPHATICS: No lymphadenopathy CARDIAC: RRR, no murmurs, no rubs, no gallops RESPIRATORY:  Clear to auscultation without rales, wheezing or rhonchi  ABDOMEN: Soft, non-tender, non-distended MUSCULOSKELETAL:  No edema; No deformity  SKIN: Warm and dry LOWER EXTREMITIES: no swelling NEUROLOGIC:  Alert and oriented x 3 PSYCHIATRIC:  Normal affect   ASSESSMENT:    1. Pseudoaneurysm following procedure ([place])   2. Abnormal nuclear stress test   3. Type 2 diabetes mellitus with hyperglycemia, without long-term current use of insulin ([place])   4. Right groin pain    PLAN:    In order of problems listed above:  1. Rule out pseudoaneurysm.  Palpation of the right groin revealed small knots about 2-1/2 cm in size which is pulsating.  Very tender.  Does some bruising in the groin area however bruising over the change color to yellow.  Distal pulses present and good.  Capillary refill is good as well.  Plan is to do ultrasounds of his groin to make sure there is no pseudoaneurysm.  I suspect even if he will have small pseudoaneurysm it will be rather small and most likely will be taking her off adjusting premonitoring or compression. 2. 2.  Abnormal stress test cardiac catheterization done which showed nonobstructive lesions 3. Right  groin pain plan as outlined above   Medication Adjustments/Labs and Tests Ordered: Current medicines are reviewed at length with the patient today.  Concerns regarding medicines are outlined above.  Orders Placed This Encounter  Procedures  . VAS [place] GROIN PSEUDOANEURYSM   Medication changes: No orders of the defined types were placed in this encounter.   Signed, Park Liter, MD, [facility] 06/16/2019 4:52 PM    [place]

## 2019-06-16 NOTE — Patient Instructions (Signed)
Medication Instructions:  Your physician recommends that you continue on your current medications as directed. Please refer to the Current Medication list given to you today.  If you need a refill on your cardiac medications before your next appointment, please call your pharmacy.   Lab work: None.   If you have labs (blood work) drawn today and your tests are completely normal, you will receive your results only by: Marland Kitchen MyChart Message (if you have MyChart) OR . A paper copy in the mail If you have any lab test that is abnormal or we need to change your treatment, we will call you to review the results.  Testing/Procedures:  You will have a ultrasound of your groin today.  Follow-Up: . We will call you to discuss follow up .  Any Other Special Instructions Will Be Listed Below (If Applicable).

## 2019-07-04 ENCOUNTER — Encounter: Payer: Self-pay | Admitting: Family Medicine

## 2019-07-05 ENCOUNTER — Ambulatory Visit: Payer: Medicare Other | Admitting: Cardiology

## 2019-07-20 ENCOUNTER — Other Ambulatory Visit: Payer: Self-pay

## 2019-07-20 ENCOUNTER — Ambulatory Visit (INDEPENDENT_AMBULATORY_CARE_PROVIDER_SITE_OTHER): Payer: Medicare Other | Admitting: Cardiology

## 2019-07-20 ENCOUNTER — Encounter: Payer: Self-pay | Admitting: Cardiology

## 2019-07-20 VITALS — BP 140/82 | HR 67 | Ht 67.0 in | Wt 171.0 lb

## 2019-07-20 DIAGNOSIS — I2510 Atherosclerotic heart disease of native coronary artery without angina pectoris: Secondary | ICD-10-CM | POA: Diagnosis not present

## 2019-07-20 DIAGNOSIS — Z13220 Encounter for screening for lipoid disorders: Secondary | ICD-10-CM

## 2019-07-20 DIAGNOSIS — E1165 Type 2 diabetes mellitus with hyperglycemia: Secondary | ICD-10-CM

## 2019-07-20 NOTE — Patient Instructions (Signed)
Medication Instructions:  Your physician recommends that you continue on your current medications as directed. Please refer to the Current Medication list given to you today.   If you need a refill on your cardiac medications before your next appointment, please call your pharmacy.   Lab work: Your physician recommends that you have a  Lipid, hepatic and BMP drawn.  If you have labs (blood work) drawn today and your tests are completely normal, you will receive your results only by: Marland Kitchen MyChart Message (if you have MyChart) OR . A paper copy in the mail If you have any lab test that is abnormal or we need to change your treatment, we will call you to review the results.  Testing/Procedures: NONE  Follow-Up: At [facility], you and your health needs are our priority.  As part of our continuing mission to provide you with exceptional heart care, we have created designated Provider Care Teams.  These Care Teams include your primary Cardiologist (physician) and Advanced Practice Providers (APPs -  Physician Assistants and Nurse Practitioners) who all work together to provide you with the care you need, when you need it. You will need a follow up appointment in 6 months.

## 2019-07-20 NOTE — Progress Notes (Signed)
Cardiology Office Note:    Date:  07/20/2019   ID:  [name], DOB [birth year], MRN [id number]  PCP:  [name], MD  Cardiologist:  Jenean Lindau, MD   Referring MD: [name], MD    ASSESSMENT:    1. Coronary artery disease involving native coronary artery of native heart without angina pectoris    PLAN:    In order of problems listed above:  1. Coronary artery disease: Mild nonobstructive in nature.  I reassured the patient about my findings.  His wife had multiple questions which were answered to their satisfaction. 2. Essential hypertension: Blood pressure is stable 3. Mixed dyslipidemia: I will get him in for blood work.  He is a diabetic.  Based on his blood work I will initiate him on low-dose statin therapy.  Importance of regular exercise stressed and he promises to comply 4. Patient will be seen in follow-up appointment in 6 months or earlier if the patient has any concerns    Medication Adjustments/Labs and Tests Ordered: Current medicines are reviewed at length with the patient today.  Concerns regarding medicines are outlined above.  No orders of the defined types were placed in this encounter.  No orders of the defined types were placed in this encounter.    Chief Complaint  Patient presents with  . Follow-up     History of Present Illness:    Bill [name] is a 78 y.o. male.  Patient was evaluated by me for abnormal stress test and has nonobstructive disease.  He denies any problems at this time and takes care of activities of daily living.  No chest pain orthopnea or PND.  At the time of my evaluation, the patient is alert awake oriented and in no distress.  Past Medical History:  Diagnosis Date  . Anemia   . Anxiety   . CAD (coronary artery disease) 07/21/2016  . Cardiac arrhythmia   . COPD (chronic obstructive pulmonary disease) ([place])   . Helicobacter pylori gastritis 07/21/2016  . Penicillin allergy 07/21/2016  . Pneumonia    . Skin cancer     Past Surgical History:  Procedure Laterality Date  . APPENDECTOMY    . back fusion    . cataract surgery Left   . CHOLECYSTECTOMY    . COLONOSCOPY  2014   clear   . LEFT HEART CATH AND CORONARY ANGIOGRAPHY N/A 06/09/2019   Procedure: LEFT HEART CATH AND CORONARY ANGIOGRAPHY;  Surgeon: Jettie Booze, MD;  Location: [place] [facility];  Service: Cardiovascular;  Laterality: N/A;  . SPINE SURGERY  1984   thoracic spine fusion     Current Medications: Current Meds  Medication Sig  . aspirin 81 MG tablet Take 81 mg by mouth daily.  Marland Kitchen b complex vitamins tablet Take 1 tablet by mouth daily.  . bisoprolol (ZEBETA) 5 MG tablet Take 0.5 tablets (2.5 mg total) by mouth daily. (Patient taking differently: Take 2.5 mg by mouth every Monday, Wednesday, and Friday. )  . blood glucose meter kit and supplies Dispense based on patient and insurance preference. Use up to four times daily as directed. (FOR ICD-10 E10.9, E11.9).  . Calcium Citrate-Vitamin D (CALCIUM CITRATE +D PO) Take 1 tablet by mouth daily.   . carboxymethylcellulose (REFRESH PLUS) 0.5 % SOLN Place 1 drop into both eyes daily as needed (dry eyes).   . cyanocobalamin 1000 MCG tablet Take 1,000 mcg by mouth daily.  . diclofenac (CATAFLAM) 50 MG tablet Take  1 tablet (50 mg total) by mouth every 8 (eight) hours as needed (pain).  Marland Kitchen loratadine (CLARITIN) 10 MG tablet Take 10 mg by mouth daily.  . Magnesium Oxide 500 MG CAPS Take 2 capsules by mouth daily.  . metFORMIN (GLUCOPHAGE) 500 MG tablet Take 1 tablet (500 mg total) by mouth 2 (two) times daily with a meal.  . Multiple Vitamin (MULTIVITAMIN) tablet Take 1 tablet by mouth daily.  . Multiple Vitamins-Minerals (PRESERVISION AREDS 2 PO) Take 1 tablet by mouth 2 (two) times daily.  . nitroGLYCERIN (NITROSTAT) 0.4 MG SL tablet Place 1 tablet (0.4 mg total) under the tongue every 5 (five) minutes as needed for chest pain.  Marland Kitchen Potassium 99 MG TABS Take 1 tablet by  mouth daily.  . vitamin C (ASCORBIC ACID) 500 MG tablet Take 500 mg by mouth daily.  . vitamin E 400 UNIT capsule Take 400 Units by mouth daily.   Current Facility-Administered Medications for the 07/20/19 encounter (Office Visit) with [name], [name], MD  Medication  . 0.9 %  sodium chloride infusion     Allergies:   Aspirin, Oxycodone, Penicillins, Percocet [oxycodone-acetaminophen], Ciprofloxacin, and Prednisone   Social History   Socioeconomic History  . Marital status: Significant Other    Spouse name: Not on file  . Number of children: 3  . Years of education: 7  . Highest education level: Not on file  Occupational History  . Occupation: retired    Fish farm manager: [employer].  Social Needs  . Financial resource strain: Not on file  . Food insecurity    Worry: Not on file    Inability: Not on file  . Transportation needs    Medical: Not on file    Non-medical: Not on file  Tobacco Use  . Smoking status: Former Research scientist (life sciences)  . Smokeless tobacco: Never Used  Substance and Sexual Activity  . Alcohol use: No  . Drug use: No  . Sexual activity: Not on file  Lifestyle  . Physical activity    Days per week: Not on file    Minutes per session: Not on file  . Stress: Not on file  Relationships  . Social Herbalist on phone: Not on file    Gets together: Not on file    Attends religious service: Not on file    Active member of club or organization: Not on file    Attends meetings of clubs or organizations: Not on file    Relationship status: Not on file  Other Topics Concern  . Not on file  Social History Narrative   Lives with significant other in a 2 story home.  Has 3 children.  Retired.  Education: 7th grade.      Family History: The patient's family history includes Colon cancer in his mother; Diabetes in his father; Heart disease in his brother, father, and son; Hyperlipidemia in his father; Hypertension in his father; Pancreatic cancer in his  cousin; Prostate cancer in his cousin; Stroke in his father.  ROS:   Please see the history of present illness.    All other systems reviewed and are negative.  EKGs/Labs/Other Studies Reviewed:    The following studies were reviewed today:  06/09/19  Physicians  Panel Physicians Referring Physician Case Authorizing Physician  Jettie Booze, MD (Primary)    Procedures  LEFT HEART CATH AND CORONARY ANGIOGRAPHY  Conclusion    Dist LM to Prox LAD lesion is 25% stenosed.  Mid LAD lesion  is 25% stenosed.  The left ventricular systolic function is normal.  LV end diastolic pressure is normal.  The left ventricular ejection fraction is 55-65% by visual estimate.  There is no aortic valve stenosis.   Nonobstructive CAD.  Continue preventive therapy.      Recent Labs: 03/28/2019: TSH 2.67 06/02/2019: BUN 11; Creatinine, Ser 0.87; Hemoglobin 14.2; Platelets 355; Potassium 4.4; Sodium 140  Recent Lipid Panel    Component Value Date/Time   CHOL 183 05/20/2018 1213   TRIG 180.0 (H) 05/20/2018 1213   HDL 34.00 (L) 05/20/2018 1213   CHOLHDL 5 05/20/2018 1213   VLDL 36.0 05/20/2018 1213   LDLCALC 113 (H) 05/20/2018 1213    Physical Exam:    VS:  BP 140/82 (BP Location: Right Arm, Patient Position: Sitting, Cuff Size: Normal)   Pulse 67   Ht _0  (1.702 m)   Wt 171 lb (77.6 kg)   SpO2 96%   BMI 26.78 kg/m     Wt Readings from Last 3 Encounters:  07/20/19 171 lb (77.6 kg)  06/16/19 170 lb (77.1 kg)  06/09/19 171 lb (77.6 kg)     GEN: Patient is in no acute distress HEENT: Normal NECK: No JVD; No carotid bruits LYMPHATICS: No lymphadenopathy CARDIAC: Hear sounds regular, 2/6 systolic murmur at the apex. RESPIRATORY:  Clear to auscultation without rales, wheezing or rhonchi  ABDOMEN: Soft, non-tender, non-distended MUSCULOSKELETAL:  No edema; No deformity  SKIN: Warm and dry NEUROLOGIC:  Alert and oriented x 3 PSYCHIATRIC:  Normal affect   Signed,  Jenean Lindau, MD  07/20/2019 3:09 PM    [facility]

## 2019-07-27 ENCOUNTER — Ambulatory Visit: Payer: Medicare Other | Admitting: Registered"

## 2019-08-29 ENCOUNTER — Other Ambulatory Visit: Payer: Self-pay

## 2019-08-29 ENCOUNTER — Encounter: Payer: Medicare Other | Attending: Family Medicine | Admitting: Registered"

## 2019-08-29 ENCOUNTER — Encounter: Payer: Self-pay | Admitting: Registered"

## 2019-08-29 DIAGNOSIS — E1165 Type 2 diabetes mellitus with hyperglycemia: Secondary | ICD-10-CM | POA: Insufficient documentation

## 2019-08-29 NOTE — Patient Instructions (Addendum)
Aim to eat balanced meals and snacks When you have low blood sugar symptoms test your blood sugar and treat with 15 grams of fast acting carbohydrates. See handout. Consider checking your blood sugar 2 hrs after a meal if you are curious how that meal is affecting you. Consider downloading the MySgr app and show your doctor your blood sugar readings. Great job cutting out soda! Consider having regular checkups with your doctor
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
## 2019-11-17 ENCOUNTER — Telehealth: Payer: Self-pay | Admitting: Family Medicine

## 2019-11-17 NOTE — Telephone Encounter (Signed)
He will need to make an OV for this. First this occurred 3 years ago and also he has not seen me in over a year and a half
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
## 2019-11-21 NOTE — Telephone Encounter (Signed)
Spoke with patient. Appointment has been made

## 2019-11-22 ENCOUNTER — Other Ambulatory Visit: Payer: Self-pay

## 2019-11-22 ENCOUNTER — Ambulatory Visit (INDEPENDENT_AMBULATORY_CARE_PROVIDER_SITE_OTHER): Payer: Medicare HMO | Admitting: Family Medicine

## 2019-11-22 ENCOUNTER — Encounter: Payer: Self-pay | Admitting: Family Medicine

## 2019-11-22 VITALS — BP 130/70 | HR 72 | Temp 97.0°F | Wt 174.2 lb

## 2019-11-22 DIAGNOSIS — R519 Headache, unspecified: Secondary | ICD-10-CM

## 2019-11-22 DIAGNOSIS — G8929 Other chronic pain: Secondary | ICD-10-CM

## 2019-11-22 DIAGNOSIS — M542 Cervicalgia: Secondary | ICD-10-CM | POA: Diagnosis not present

## 2019-11-22 MED ORDER — BISOPROLOL FUMARATE 5 MG PO TABS: 2.5000 mg | ORAL_TABLET | ORAL | 3 refills | Status: DC
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
## 2020-01-04 ENCOUNTER — Telehealth: Payer: Self-pay | Admitting: Family Medicine

## 2020-01-04 NOTE — Telephone Encounter (Signed)
Pt is calling in stating the he would like to have something called in for his poison oak that he gets every year.  Pt decline to come in for an appointment.  Pharm:  [facility] on [address]

## 2020-01-05 MED ORDER — METHYLPREDNISOLONE 4 MG PO TBPK: ORAL_TABLET | ORAL | 0 refills | Status: DC

## 2020-01-05 NOTE — Telephone Encounter (Signed)
Patient is aware. Directions please

## 2020-01-05 NOTE — Telephone Encounter (Signed)
Call in a 4 mg Medrol dose pack  

## 2020-01-05 NOTE — Telephone Encounter (Signed)
Rx sent 

## 2020-01-05 NOTE — Telephone Encounter (Signed)
Its 21 tablets, "as directed"

## 2020-01-10 DIAGNOSIS — E1165 Type 2 diabetes mellitus with hyperglycemia: Secondary | ICD-10-CM | POA: Diagnosis not present

## 2020-01-10 DIAGNOSIS — Z13220 Encounter for screening for lipoid disorders: Secondary | ICD-10-CM | POA: Diagnosis not present

## 2020-01-10 DIAGNOSIS — I2510 Atherosclerotic heart disease of native coronary artery without angina pectoris: Secondary | ICD-10-CM | POA: Diagnosis not present

## 2020-01-11 ENCOUNTER — Other Ambulatory Visit: Payer: Self-pay

## 2020-01-11 LAB — BASIC METABOLIC PANEL
BUN/Creatinine Ratio: 19 (ref 10–24)
BUN: 17 mg/dL (ref 8–27)
CO2: 23 mmol/L (ref 20–29)
Calcium: 9.2 mg/dL (ref 8.6–10.2)
Chloride: 104 mmol/L (ref 96–106)
Creatinine, Ser: 0.89 mg/dL (ref 0.76–1.27)
GFR calc Af Amer: 95 mL/min/{1.73_m2} (ref >59)
GFR calc non Af Amer: 82 mL/min/{1.73_m2} (ref >59)
Glucose: 131 mg/dL — ABNORMAL HIGH (ref 65–99)
Potassium: 4.3 mmol/L (ref 3.5–5.2)
Sodium: 139 mmol/L (ref 134–144)

## 2020-01-11 LAB — HEPATIC FUNCTION PANEL
ALT: 60 IU/L — ABNORMAL HIGH (ref 0–44)
AST: 43 IU/L — ABNORMAL HIGH (ref 0–40)
Albumin: 4.1 g/dL (ref 3.7–4.7)
Alkaline Phosphatase: 131 IU/L — ABNORMAL HIGH (ref 39–117)
Bilirubin Total: 0.6 mg/dL (ref 0.0–1.2)
Bilirubin, Direct: 0.17 mg/dL (ref 0.00–0.40)
Total Protein: 6.7 g/dL (ref 6.0–8.5)

## 2020-01-11 LAB — LIPID PANEL
Chol/HDL Ratio: 4 ratio (ref 0.0–5.0)
Cholesterol, Total: 170 mg/dL (ref 100–199)
HDL: 42 mg/dL (ref >39)
LDL Chol Calc (NIH): 110 mg/dL — ABNORMAL HIGH (ref 0–99)
Triglycerides: 96 mg/dL (ref 0–149)
VLDL Cholesterol Cal: 18 mg/dL (ref 5–40)

## 2020-01-12 ENCOUNTER — Ambulatory Visit: Payer: Medicare HMO | Admitting: Cardiology

## 2020-01-12 ENCOUNTER — Ambulatory Visit: Payer: Medicare HMO | Admitting: Family Medicine

## 2020-01-12 ENCOUNTER — Encounter: Payer: Self-pay | Admitting: Cardiology

## 2020-01-12 VITALS — BP 130/80 | HR 62 | Ht 67.0 in | Wt 172.0 lb

## 2020-01-12 DIAGNOSIS — R7989 Other specified abnormal findings of blood chemistry: Secondary | ICD-10-CM

## 2020-01-12 DIAGNOSIS — I2510 Atherosclerotic heart disease of native coronary artery without angina pectoris: Secondary | ICD-10-CM

## 2020-01-12 DIAGNOSIS — E1165 Type 2 diabetes mellitus with hyperglycemia: Secondary | ICD-10-CM | POA: Diagnosis not present

## 2020-01-12 DIAGNOSIS — E782 Mixed hyperlipidemia: Secondary | ICD-10-CM

## 2020-01-12 HISTORY — DX: Other specified abnormal findings of blood chemistry: R79.89

## 2020-01-12 HISTORY — DX: Mixed hyperlipidemia: E78.2

## 2020-01-12 NOTE — Patient Instructions (Signed)
Medication Instructions:  No medication changes *If you need a refill on your cardiac medications before your next appointment, please call your pharmacy*   Lab Work: None orderede If you have labs (blood work) drawn today and your tests are completely normal, you will receive your results only by: Marland Kitchen MyChart Message (if you have MyChart) OR . A paper copy in the mail If you have any lab test that is abnormal or we need to change your treatment, we will call you to review the results.   Testing/Procedures: None ordered   Follow-Up: At [facility], you and your health needs are our priority.  As part of our continuing mission to provide you with exceptional heart care, we have created designated Provider Care Teams.  These Care Teams include your primary Cardiologist (physician) and Advanced Practice Providers (APPs -  Physician Assistants and Nurse Practitioners) who all work together to provide you with the care you need, when you need it.  We recommend signing up for the patient portal called "MyChart".  Sign up information is provided on this After Visit Summary.  MyChart is used to connect with patients for Virtual Visits (Telemedicine).  Patients are able to view lab/test results, encounter notes, upcoming appointments, etc.  Non-urgent messages can be sent to your provider as well.   To learn more about what you can do with MyChart, go to [email or web address].    Your next appointment:   6 month(s)  The format for your next appointment:   In Person  Provider:   Jyl Heinz, MD   Other Instructions NA

## 2020-01-12 NOTE — Progress Notes (Signed)
Cardiology Office Note:    Date:  01/12/2020   ID:  Bill [name], DOB 1941-01-02, MRN [id number]  PCP:  Bill Morale, MD  Cardiologist:  Bill Lindau, MD   Referring MD: Bill Morale, MD    ASSESSMENT:    1. Coronary artery disease involving native coronary artery of native heart without angina pectoris   2. Type 2 diabetes mellitus with hyperglycemia, without long-term current use of insulin (HCC)   3. Mixed dyslipidemia   4. Elevated LFTs    PLAN:    In order of problems listed above:  1. Coronary artery disease: Nonobstructive in nature minimally disease on coronary angiography.  Secondary prevention stressed with the patient.  Importance of compliance with diet and medication stressed and he vocalized understanding.  Importance of regular exercise stressed and he is doing well with it. 2. Mixed dyslipidemia: Diet was discussed.  He is not on statin therapy.  I would not like to initiate him because of minor elevation in LFTs.  He understands this and was going to get in touch with his primary care physician about discussing this issue further. 3. Diabetes mellitus: Diet discussed and the patient is doing well and this is followed by primary care physician. 4. Patient will be seen in follow-up appointment in 6 months or earlier if the patient has any concerns    Medication Adjustments/Labs and Tests Ordered: Current medicines are reviewed at length with the patient today.  Concerns regarding medicines are outlined above.  No orders of the defined types were placed in this encounter.  No orders of the defined types were placed in this encounter.    Chief Complaint  Patient presents with  . Follow-up     History of Present Illness:    Bill [name] is a 79 y.o. male.  Patient has past medical history of nonobstructive minor coronary artery disease diabetes mellitus and dyslipidemia.  He denies any problems at this time and takes care of activities of  daily living.  No chest pain orthopnea or PND.  At the time of my evaluation, the patient is alert awake oriented and in no distress.  Past Medical History:  Diagnosis Date  . Abnormal nuclear stress test 06/02/2019  . Anemia   . Anxiety   . CAD (coronary artery disease) 07/21/2016  . Cardiac arrhythmia   . COPD (chronic obstructive pulmonary disease) ([place])   . COPD (chronic obstructive pulmonary disease) with emphysema ([place]) 12/14/2017  . Frequent headaches 04/14/2018  . Helicobacter pylori gastritis 07/21/2016  . Penicillin allergy 07/21/2016  . Pneumonia   . Right groin pain 06/16/2019  . Skin cancer   . Type 2 diabetes mellitus with hyperglycemia, without long-term current use of insulin ([place]) 03/28/2019    Past Surgical History:  Procedure Laterality Date  . APPENDECTOMY    . back fusion    . cataract surgery Left   . CHOLECYSTECTOMY    . COLONOSCOPY  2014   clear   . LEFT HEART CATH AND CORONARY ANGIOGRAPHY N/A 06/09/2019   Procedure: LEFT HEART CATH AND CORONARY ANGIOGRAPHY;  Surgeon: Jettie Booze, MD;  Location: [place] [facility];  Service: Cardiovascular;  Laterality: N/A;  . SPINE SURGERY  1984   thoracic spine fusion     Current Medications: Current Meds  Medication Sig  . aspirin 81 MG tablet Take 81 mg by mouth daily.  Marland Kitchen b complex vitamins tablet Take 1 tablet by mouth daily.  . bisoprolol (ZEBETA)  5 MG tablet Take 0.5 tablets (2.5 mg total) by mouth every Monday, Wednesday, and Friday.  . blood glucose meter kit and supplies Dispense based on patient and insurance preference. Use up to four times daily as directed. (FOR ICD-10 E10.9, E11.9).  . Calcium Citrate-Vitamin D (CALCIUM CITRATE +D PO) Take 1 tablet by mouth daily.   . carboxymethylcellulose (REFRESH PLUS) 0.5 % SOLN Place 1 drop into both eyes daily as needed (dry eyes).   . cyanocobalamin 1000 MCG tablet Take 1,000 mcg by mouth daily.  . diclofenac (CATAFLAM) 50 MG tablet Take 1 tablet (50 mg total)  by mouth every 8 (eight) hours as needed (pain).  Marland Kitchen loratadine (CLARITIN) 10 MG tablet Take 10 mg by mouth daily.  . Magnesium Oxide 500 MG CAPS Take 2 capsules by mouth daily.  . metFORMIN (GLUCOPHAGE) 500 MG tablet Take 1 tablet (500 mg total) by mouth 2 (two) times daily with a meal.  . methylPREDNISolone (MEDROL DOSEPAK) 4 MG TBPK tablet Use as directed  . Multiple Vitamin (MULTIVITAMIN) tablet Take 1 tablet by mouth daily.  . Multiple Vitamins-Minerals (PRESERVISION AREDS 2 PO) Take 1 tablet by mouth 2 (two) times daily.  . nitroGLYCERIN (NITROSTAT) 0.4 MG SL tablet Place 1 tablet (0.4 mg total) under the tongue every 5 (five) minutes as needed for chest pain.  Marland Kitchen Potassium 99 MG TABS Take 1 tablet by mouth daily.  . vitamin C (ASCORBIC ACID) 500 MG tablet Take 500 mg by mouth daily.  . vitamin E 400 UNIT capsule Take 400 Units by mouth daily.  . [DISCONTINUED] Potassium Gluconate 2.5 MEQ TABS Take by mouth.   Current Facility-Administered Medications for the 01/12/20 encounter (Office Visit) with Deloy Archey, [name], MD  Medication  . 0.9 %  sodium chloride infusion     Allergies:   Aspirin, Oxycodone, Penicillins, Percocet [oxycodone-acetaminophen], Ciprofloxacin, and Prednisone   Social History   Socioeconomic History  . Marital status: Significant Other    Spouse name: Not on file  . Number of children: 3  . Years of education: 7  . Highest education level: Not on file  Occupational History  . Occupation: retired    Fish farm manager: [employer].  Tobacco Use  . Smoking status: Former Research scientist (life sciences)  . Smokeless tobacco: Never Used  Substance and Sexual Activity  . Alcohol use: No  . Drug use: No  . Sexual activity: Not on file  Other Topics Concern  . Not on file  Social History Narrative   Lives with significant other in a 2 story home.  Has 3 children.  Retired.  Education: 7th grade.    Social Determinants of Health   Financial Resource Strain:   . Difficulty of Paying  Living Expenses:   Food Insecurity:   . Worried About Charity fundraiser in the Last Year:   . Arboriculturist in the Last Year:   Transportation Needs:   . Film/video editor (Medical):   Marland Kitchen Lack of Transportation (Non-Medical):   Physical Activity:   . Days of Exercise per Week:   . Minutes of Exercise per Session:   Stress:   . Feeling of Stress :   Social Connections:   . Frequency of Communication with Friends and Family:   . Frequency of Social Gatherings with Friends and Family:   . Attends Religious Services:   . Active Member of Clubs or Organizations:   . Attends Archivist Meetings:   Marland Kitchen Marital Status:  Family History: The patient's family history includes Colon cancer in his mother; Diabetes in his father; Heart disease in his brother, father, and son; Hyperlipidemia in his father; Hypertension in his father; Pancreatic cancer in his cousin; Prostate cancer in his cousin; Stroke in his father.  ROS:   Please see the history of present illness.    All other systems reviewed and are negative.  EKGs/Labs/Other Studies Reviewed:    The following studies were reviewed today: Coronary angiography records were reviewed revealed nonobstructive minor coronary artery disease   Recent Labs: 03/28/2019: TSH 2.67 06/02/2019: Hemoglobin 14.2; Platelets 355 01/10/2020: ALT 60; BUN 17; Creatinine, Ser 0.89; Potassium 4.3; Sodium 139  Recent Lipid Panel    Component Value Date/Time   CHOL 170 01/10/2020 1549   TRIG 96 01/10/2020 1549   HDL 42 01/10/2020 1549   CHOLHDL 4.0 01/10/2020 1549   CHOLHDL 5 05/20/2018 1213   VLDL 36.0 05/20/2018 1213   LDLCALC 110 (H) 01/10/2020 1549    Physical Exam:    VS:  BP 130/80   Pulse 62   Ht 5' 7"  (1.702 m)   Wt 172 lb (78 kg)   SpO2 95%   BMI 26.94 kg/m     Wt Readings from Last 3 Encounters:  01/12/20 172 lb (78 kg)  11/22/19 174 lb 3.2 oz (79 kg)  07/20/19 171 lb (77.6 kg)     GEN: Patient is in no acute  distress HEENT: Normal NECK: No JVD; No carotid bruits LYMPHATICS: No lymphadenopathy CARDIAC: Hear sounds regular, 2/6 systolic murmur at the apex. RESPIRATORY:  Clear to auscultation without rales, wheezing or rhonchi  ABDOMEN: Soft, non-tender, non-distended MUSCULOSKELETAL:  No edema; No deformity  SKIN: Warm and dry NEUROLOGIC:  Alert and oriented x 3 PSYCHIATRIC:  Normal affect   Signed, Bill Lindau, MD  01/12/2020 3:36 PM     [facility]

## 2020-01-15 ENCOUNTER — Other Ambulatory Visit: Payer: Self-pay

## 2020-01-15 ENCOUNTER — Encounter: Payer: Self-pay | Admitting: Family Medicine

## 2020-01-15 ENCOUNTER — Ambulatory Visit (INDEPENDENT_AMBULATORY_CARE_PROVIDER_SITE_OTHER): Payer: Medicare HMO | Admitting: Family Medicine

## 2020-01-15 VITALS — BP 110/76 | HR 63 | Temp 97.6°F | Ht 67.0 in | Wt 170.5 lb

## 2020-01-15 DIAGNOSIS — E1165 Type 2 diabetes mellitus with hyperglycemia: Secondary | ICD-10-CM

## 2020-01-15 DIAGNOSIS — R7401 Elevation of levels of liver transaminase levels: Secondary | ICD-10-CM | POA: Diagnosis not present

## 2020-01-15 DIAGNOSIS — D72829 Elevated white blood cell count, unspecified: Secondary | ICD-10-CM | POA: Diagnosis not present

## 2020-01-15 DIAGNOSIS — L237 Allergic contact dermatitis due to plants, except food: Secondary | ICD-10-CM | POA: Diagnosis not present

## 2020-01-15 MED ORDER — METHYLPREDNISOLONE 4 MG PO TBPK: ORAL_TABLET | ORAL | 2 refills | Status: DC

## 2020-01-15 NOTE — Progress Notes (Signed)
   Subjective:    Patient ID: Bill [name], male    DOB: 1941-02-21, 79 y.o.   MRN: [id number]  HPI Here for several issues. First he has been dealing with an itchy rash on his back for several weeks. He thinks this is due to poison ivy, which he has been running into while clearing brush from around his property. Calamine lotion has not helped. Also he has been worried abouthis consistently elevated WBC counts. These have run as high as 18 in the past few years, and last August they were 14.8. RBC and platelets have been stable. Also he recently had labs drawn per Dr. Geraldo Pitter, his cardiologist. These included elevated liver enzymes on 01-10-20. Including an alkaline phosphatase at 131, AST at 43,and ALT at 60. He has felt fine in general.he drinks about 1/2 glass of wine a week and no other alcohol. He denies any abdominal pain or nausea or jaundice. He does not take statins.    Review of Systems  Constitutional: Negative.   Respiratory: Negative.   Cardiovascular: Negative.   Gastrointestinal: Negative.   Skin: Positive for rash.       Objective:   Physical Exam Constitutional:      Appearance: Normal appearance.  Cardiovascular:     Rate and Rhythm: Normal rate and regular rhythm.     Pulses: Normal pulses.     Heart sounds: Normal heart sounds.  Pulmonary:     Effort: Pulmonary effort is normal.     Breath sounds: Normal breath sounds.  Abdominal:     General: Abdomen is flat. Bowel sounds are normal. There is no distension.     Palpations: Abdomen is soft. There is no mass.     Tenderness: There is no abdominal tenderness. There is no guarding or rebound.     Hernia: No hernia is present.  Skin:    Comments: Diffuse red maculopapular rash over the back. Most lesions are excoriated.   Neurological:     Mental Status: He is alert.           Assessment & Plan:  For contact dermatitis, given a Medrol dose pack. For elevated WBC, we will refer him to Hematology to  evaluate. For mildly elevated liver enzymes, we will simply monitor these closely. Recheck in 3 months. He will avoid taking any vitamins OTC.  Alysia Penna, MD

## 2020-01-22 ENCOUNTER — Telehealth: Payer: Self-pay | Admitting: Hematology and Oncology

## 2020-01-22 NOTE — Telephone Encounter (Signed)
Received a new hem referral from Dr. Sarajane Jews for leukocytosis. Mr. Mckiney has been scheduled to see Dr. Lorenso Courier on 4/22 at 2pm. Pt aware to arrive 15 minutes early.

## 2020-02-15 ENCOUNTER — Inpatient Hospital Stay: Payer: Medicare HMO | Attending: Hematology and Oncology | Admitting: Hematology and Oncology

## 2020-02-15 ENCOUNTER — Inpatient Hospital Stay: Payer: Medicare HMO

## 2020-02-15 ENCOUNTER — Other Ambulatory Visit: Payer: Self-pay

## 2020-02-15 VITALS — BP 143/70 | HR 53 | Temp 98.7°F | Resp 17 | Ht 67.0 in | Wt 170.9 lb

## 2020-02-15 DIAGNOSIS — Z7982 Long term (current) use of aspirin: Secondary | ICD-10-CM

## 2020-02-15 DIAGNOSIS — D72825 Bandemia: Secondary | ICD-10-CM

## 2020-02-15 DIAGNOSIS — I2510 Atherosclerotic heart disease of native coronary artery without angina pectoris: Secondary | ICD-10-CM | POA: Diagnosis not present

## 2020-02-15 DIAGNOSIS — Z85828 Personal history of other malignant neoplasm of skin: Secondary | ICD-10-CM | POA: Diagnosis not present

## 2020-02-15 DIAGNOSIS — Z8042 Family history of malignant neoplasm of prostate: Secondary | ICD-10-CM | POA: Diagnosis not present

## 2020-02-15 DIAGNOSIS — Z800 Family history of malignant neoplasm of digestive organs: Secondary | ICD-10-CM | POA: Diagnosis not present

## 2020-02-15 DIAGNOSIS — D72829 Elevated white blood cell count, unspecified: Secondary | ICD-10-CM | POA: Diagnosis not present

## 2020-02-15 DIAGNOSIS — E119 Type 2 diabetes mellitus without complications: Secondary | ICD-10-CM | POA: Insufficient documentation

## 2020-02-15 DIAGNOSIS — D471 Chronic myeloproliferative disease: Secondary | ICD-10-CM | POA: Diagnosis not present

## 2020-02-15 DIAGNOSIS — J449 Chronic obstructive pulmonary disease, unspecified: Secondary | ICD-10-CM | POA: Insufficient documentation

## 2020-02-15 LAB — CMP (CANCER CENTER ONLY)
ALT: 36 U/L (ref 0–44)
AST: 24 U/L (ref 15–41)
Albumin: 3.5 g/dL (ref 3.5–5.0)
Alkaline Phosphatase: 95 U/L (ref 38–126)
Anion gap: 9 (ref 5–15)
BUN: 10 mg/dL (ref 8–23)
CO2: 22 mmol/L (ref 22–32)
Calcium: 9 mg/dL (ref 8.9–10.3)
Chloride: 107 mmol/L (ref 98–111)
Creatinine: 0.9 mg/dL (ref 0.61–1.24)
GFR, Est AFR Am: >60 mL/min (ref >60)
GFR, Estimated: >60 mL/min (ref >60)
Glucose, Bld: 164 mg/dL — ABNORMAL HIGH (ref 70–99)
Potassium: 3.9 mmol/L (ref 3.5–5.1)
Sodium: 138 mmol/L (ref 135–145)
Total Bilirubin: 0.6 mg/dL (ref 0.3–1.2)
Total Protein: 6.8 g/dL (ref 6.5–8.1)

## 2020-02-15 LAB — CBC WITH DIFFERENTIAL (CANCER CENTER ONLY)
Abs Immature Granulocytes: 0.17 K/uL — ABNORMAL HIGH (ref 0.00–0.07)
Basophils Absolute: 0.3 K/uL — ABNORMAL HIGH (ref 0.0–0.1)
Basophils Relative: 2 %
Eosinophils Absolute: 0.6 K/uL — ABNORMAL HIGH (ref 0.0–0.5)
Eosinophils Relative: 4 %
HCT: 47.2 % (ref 39.0–52.0)
Hemoglobin: 15.6 g/dL (ref 13.0–17.0)
Immature Granulocytes: 1 %
Lymphocytes Relative: 18 %
Lymphs Abs: 2.9 K/uL (ref 0.7–4.0)
MCH: 32 pg (ref 26.0–34.0)
MCHC: 33.1 g/dL (ref 30.0–36.0)
MCV: 96.7 fL (ref 80.0–100.0)
Monocytes Absolute: 0.9 K/uL (ref 0.1–1.0)
Monocytes Relative: 6 %
Neutro Abs: 11.4 K/uL — ABNORMAL HIGH (ref 1.7–7.7)
Neutrophils Relative %: 69 %
Platelet Count: 294 K/uL (ref 150–400)
RBC: 4.88 MIL/uL (ref 4.22–5.81)
RDW: 17.5 % — ABNORMAL HIGH (ref 11.5–15.5)
WBC Count: 16.4 K/uL — ABNORMAL HIGH (ref 4.0–10.5)
nRBC: 0 % (ref 0.0–0.2)

## 2020-02-15 LAB — C-REACTIVE PROTEIN: CRP: 0.8 mg/dL (ref <1.0)

## 2020-02-15 LAB — SEDIMENTATION RATE: Sed Rate: 1 mm/h (ref 0–16)

## 2020-02-15 LAB — LACTATE DEHYDROGENASE: LDH: 273 U/L — ABNORMAL HIGH (ref 98–192)

## 2020-02-15 LAB — SAVE SMEAR(SSMR), FOR PROVIDER SLIDE REVIEW
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
## 2020-02-16 DIAGNOSIS — Z6826 Body mass index (BMI) 26.0-26.9, adult: Secondary | ICD-10-CM

## 2020-02-16 DIAGNOSIS — M47812 Spondylosis without myelopathy or radiculopathy, cervical region: Secondary | ICD-10-CM

## 2020-02-16 DIAGNOSIS — M542 Cervicalgia: Secondary | ICD-10-CM

## 2020-02-16 DIAGNOSIS — R030 Elevated blood-pressure reading, without diagnosis of hypertension: Secondary | ICD-10-CM

## 2020-02-16 HISTORY — DX: Cervicalgia: M54.2

## 2020-02-16 HISTORY — DX: Body mass index (BMI) 26.0-26.9, adult: Z68.26

## 2020-02-16 HISTORY — DX: Spondylosis without myelopathy or radiculopathy, cervical region: M47.812

## 2020-02-16 HISTORY — DX: Elevated blood-pressure reading, without diagnosis of hypertension: R03.0

## 2020-02-18 ENCOUNTER — Encounter: Payer: Self-pay | Admitting: Hematology and Oncology

## 2020-02-26 ENCOUNTER — Telehealth: Payer: Self-pay | Admitting: Family Medicine

## 2020-02-26 NOTE — Chronic Care Management (AMB) (Signed)
°  Chronic Care Management   Outreach Note  02/26/2020 Name: KILO ZELTSER MRN: [id number] DOB: [birth year]  Referred by: [name], MD Reason for referral : Chronic Care Management   An unsuccessful telephone outreach was attempted today. The patient was referred to the pharmacist for assistance with care management and care coordination.   Follow Up Plan:   [place]

## 2020-02-27 ENCOUNTER — Telehealth: Payer: Self-pay | Admitting: Family Medicine

## 2020-02-27 NOTE — Chronic Care Management (AMB) (Signed)
  Chronic Care Management   Note  02/27/2020 Name: Bill [name] MRN: [id number] DOB: 06-11-1941  Bill [name] is a 79 y.o. year old male who is a primary care patient of [name], MD. I reached out to Bill November by phone today in response to a referral sent by Bill [name] Service's PCP, [name], MD.   Bill [name] was given information about Chronic Care Management services today including:  1. CCM service includes personalized support from designated clinical staff supervised by his physician, including individualized plan of care and coordination with other care providers 2. 24/7 contact phone numbers for assistance for urgent and routine care needs. 3. Service will only be billed when office clinical staff spend 20 minutes or more in a month to coordinate care. 4. Only one practitioner may furnish and bill the service in a calendar month. 5. The patient may stop CCM services at any time (effective at the end of the month) by phone call to the office staff.   Patient agreed to services and verbal consent obtained.   Follow up plan:   [place]
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
## 2020-03-19 ENCOUNTER — Other Ambulatory Visit: Payer: Self-pay

## 2020-03-19 ENCOUNTER — Ambulatory Visit: Payer: Medicare HMO | Admitting: Pharmacist

## 2020-03-19 DIAGNOSIS — E782 Mixed hyperlipidemia: Secondary | ICD-10-CM

## 2020-03-19 DIAGNOSIS — E1165 Type 2 diabetes mellitus with hyperglycemia: Secondary | ICD-10-CM

## 2020-03-20 NOTE — Patient Instructions (Addendum)
Visit Information  It was a pleasure meeting with you today, Bill [name]! I had fun looking at your paintings and hearing about your stories. Thank you for meeting with me to discuss your medications! I look forward to working with you to achieve your health care goals. Below is a summary of what we talked about during the visit:  Goals Addressed            This Visit's Progress   . Chronic Care Management       CARE PLAN ENTRY  Current Barriers:  . Chronic Disease Management support, education, and care coordination needs related to Hypertension and Diabetes   Hypertension . Pharmacist Clinical Goal(s): o Over the next 90 days, patient will work with PharmD and providers to maintain BP goal <140/90 . Current regimen:  o Bisoprolol 5 mg 0.5 mg tablet every MWF . Interventions: o Discussed importance of diet and exercise . Patient self care activities - Over the next 90 days, patient will: o Ensure daily salt intake < 2300 mg/day o Keep self physically active by doing house work or yard work  Diabetes . Pharmacist Clinical Goal(s): o Over the next 90 days, patient will work with PharmD and providers to achieve A1c goal <7% . Current regimen:  o Metformin 500 mg 1 tablet twice a day with meals . Interventions: o Discussed the importance of diet and exercise . Patient self care activities - Over the next 90 days, patient will: o Check blood sugar once daily, document, and provide at future appointments o Contact provider with any episodes of hypoglycemia o Continue cutting down on sugar intake  Medication management . Pharmacist Clinical Goal(s): o Over the next 90 days, patient will work with PharmD and providers to maintain optimal medication adherence . Current pharmacy: [facility] . Interventions o Comprehensive medication review performed. o Continue current medication management strategy . Patient self care activities - Over the next 90 days, patient  will: o Take medications as prescribed o Report any questions or concerns to PharmD and/or provider(s)  Initial goal documentation        Bill [name] was given information about Chronic Care Management services today including:  1. CCM service includes personalized support from designated clinical staff supervised by his physician, including individualized plan of care and coordination with other care providers 2. 24/7 contact phone numbers for assistance for urgent and routine care needs. 3. Standard insurance, coinsurance, copays and deductibles apply for chronic care management only during months in which we provide at least 20 minutes of these services. Most insurances cover these services at 100%, however patients may be responsible for any copay, coinsurance and/or deductible if applicable. This service may help you avoid the need for more expensive face-to-face services. 4. Only one practitioner may furnish and bill the service in a calendar month. 5. The patient may stop CCM services at any time (effective at the end of the month) by phone call to the office staff.  Patient agreed to services and verbal consent obtained.   The patient verbalized understanding of instructions provided today and agreed to receive a mailed copy of patient instruction and/or educational materials. Face to Face appointment with pharmacist scheduled for: 06/25/20 at 4 PM . Geraldine Contras, PharmD Clinical Pharmacist [facility] [phone]   Diabetes Mellitus and Nutrition, Adult When you have diabetes (diabetes mellitus), it is very important to have healthy eating habits because your blood sugar (glucose) levels are greatly affected by what  you eat and drink. Eating healthy foods in the appropriate amounts, at about the same times every day, can help you:  Control your blood glucose.  Lower your risk of heart disease.  Improve your blood pressure.  Reach or maintain a  healthy weight. Every person with diabetes is different, and each person has different needs for a meal plan. Your health care provider may recommend that you work with a diet and nutrition specialist (dietitian) to make a meal plan that is best for you. Your meal plan may vary depending on factors such as:  The calories you need.  The medicines you take.  Your weight.  Your blood glucose, blood pressure, and cholesterol levels.  Your activity level.  Other health conditions you have, such as heart or kidney disease. How do carbohydrates affect me? Carbohydrates, also called carbs, affect your blood glucose level more than any other type of food. Eating carbs naturally raises the amount of glucose in your blood. Carb counting is a method for keeping track of how many carbs you eat. Counting carbs is important to keep your blood glucose at a healthy level, especially if you use insulin or take certain oral diabetes medicines. It is important to know how many carbs you can safely have in each meal. This is different for every person. Your dietitian can help you calculate how many carbs you should have at each meal and for each snack. Foods that contain carbs include:  Bread, cereal, rice, pasta, and crackers.  Potatoes and corn.  Peas, beans, and lentils.  Milk and yogurt.  Fruit and juice.  Desserts, such as cakes, cookies, ice cream, and candy. How does alcohol affect me? Alcohol can cause a sudden decrease in blood glucose (hypoglycemia), especially if you use insulin or take certain oral diabetes medicines. Hypoglycemia can be a life-threatening condition. Symptoms of hypoglycemia (sleepiness, dizziness, and confusion) are similar to symptoms of having too much alcohol. If your health care provider says that alcohol is safe for you, follow these guidelines:  Limit alcohol intake to no more than 1 drink per day for nonpregnant women and 2 drinks per day for men. One drink equals 12  oz of beer, 5 oz of wine, or 1 oz of hard liquor.  Do not drink on an empty stomach.  Keep yourself hydrated with water, diet soda, or unsweetened iced tea.  Keep in mind that regular soda, juice, and other mixers may contain a lot of sugar and must be counted as carbs. What are tips for following this plan?  Reading food labels  Start by checking the serving size on the "Nutrition Facts" label of packaged foods and drinks. The amount of calories, carbs, fats, and other nutrients listed on the label is based on one serving of the item. Many items contain more than one serving per package.  Check the total grams (g) of carbs in one serving. You can calculate the number of servings of carbs in one serving by dividing the total carbs by 15. For example, if a food has 30 g of total carbs, it would be equal to 2 servings of carbs.  Check the number of grams (g) of saturated and trans fats in one serving. Choose foods that have low or no amount of these fats.  Check the number of milligrams (mg) of salt (sodium) in one serving. Most people should limit total sodium intake to less than 2,300 mg per day.  Always check the nutrition information of foods  labeled as "low-fat" or "nonfat". These foods may be higher in added sugar or refined carbs and should be avoided.  Talk to your dietitian to identify your daily goals for nutrients listed on the label. Shopping  Avoid buying canned, premade, or processed foods. These foods tend to be high in fat, sodium, and added sugar.  Shop around the outside edge of the grocery store. This includes fresh fruits and vegetables, bulk grains, fresh meats, and fresh dairy. Cooking  Use low-heat cooking methods, such as baking, instead of high-heat cooking methods like deep frying.  Cook using healthy oils, such as olive, canola, or sunflower oil.  Avoid cooking with butter, cream, or high-fat meats. Meal planning  Eat meals and snacks regularly,  preferably at the same times every day. Avoid going long periods of time without eating.  Eat foods high in fiber, such as fresh fruits, vegetables, beans, and whole grains. Talk to your dietitian about how many servings of carbs you can eat at each meal.  Eat 4-6 ounces (oz) of lean protein each day, such as lean meat, chicken, fish, eggs, or tofu. One oz of lean protein is equal to: ? 1 oz of meat, chicken, or fish. ? 1 egg. ?  cup of tofu.  Eat some foods each day that contain healthy fats, such as avocado, nuts, seeds, and fish. Lifestyle  Check your blood glucose regularly.  Exercise regularly as told by your health care provider. This may include: ? 150 minutes of moderate-intensity or vigorous-intensity exercise each week. This could be brisk walking, biking, or water aerobics. ? Stretching and doing strength exercises, such as yoga or weightlifting, at least 2 times a week.  Take medicines as told by your health care provider.  Do not use any products that contain nicotine or tobacco, such as cigarettes and e-cigarettes. If you need help quitting, ask your health care provider.  Work with a Social worker or diabetes educator to identify strategies to manage stress and any emotional and social challenges. Questions to ask a health care provider  Do I need to meet with a diabetes educator?  Do I need to meet with a dietitian?  What number can I call if I have questions?  When are the best times to check my blood glucose? Where to find more information:  American Diabetes Association: diabetes.org  Academy of Nutrition and Dietetics: www.eatright.[employer] of Diabetes and Digestive and Kidney Diseases (NIH): [employer] Summary  A healthy meal plan will help you control your blood glucose and maintain a healthy lifestyle.  Working with a diet and nutrition specialist (dietitian) can help you make a meal plan that is best for you.  Keep in mind that  carbohydrates (carbs) and alcohol have immediate effects on your blood glucose levels. It is important to count carbs and to use alcohol carefully. This information is not intended to replace advice given to you by your health care provider. Make sure you discuss any questions you have with your health care provider. Document Revised: 09/24/2017 Document Reviewed: 11/16/2016 Elsevier Patient Education  2020 [employer].

## 2020-04-08 LAB — BCR ABL1 FISH (GENPATH)

## 2020-04-08 LAB — JAK2 (INCLUDING V617F AND EXON 12), MPL,& CALR W/RFL MPN PANEL (NGS)

## 2020-04-15 ENCOUNTER — Ambulatory Visit: Payer: Medicare HMO | Admitting: Hematology and Oncology

## 2020-04-15 ENCOUNTER — Other Ambulatory Visit: Payer: Self-pay

## 2020-04-15 ENCOUNTER — Other Ambulatory Visit: Payer: Medicare HMO

## 2020-04-16 ENCOUNTER — Ambulatory Visit (INDEPENDENT_AMBULATORY_CARE_PROVIDER_SITE_OTHER): Payer: Medicare HMO | Admitting: Family Medicine

## 2020-04-16 ENCOUNTER — Encounter: Payer: Self-pay | Admitting: Family Medicine

## 2020-04-16 VITALS — BP 120/62 | HR 61 | Temp 97.9°F | Wt 167.2 lb

## 2020-04-16 DIAGNOSIS — R7989 Other specified abnormal findings of blood chemistry: Secondary | ICD-10-CM

## 2020-04-16 DIAGNOSIS — J439 Emphysema, unspecified: Secondary | ICD-10-CM

## 2020-04-16 DIAGNOSIS — E785 Hyperlipidemia, unspecified: Secondary | ICD-10-CM | POA: Diagnosis not present

## 2020-04-16 DIAGNOSIS — E782 Mixed hyperlipidemia: Secondary | ICD-10-CM

## 2020-04-16 DIAGNOSIS — E1165 Type 2 diabetes mellitus with hyperglycemia: Secondary | ICD-10-CM

## 2020-04-16 LAB — LIPID PANEL
Cholesterol: 182 mg/dL (ref 0–200)
HDL: 39.7 mg/dL (ref >39.00)
LDL Cholesterol: 117 mg/dL — ABNORMAL HIGH (ref 0–99)
NonHDL: 142.24
Total CHOL/HDL Ratio: 5
Triglycerides: 128 mg/dL (ref 0.0–149.0)
VLDL: 25.6 mg/dL (ref 0.0–40.0)

## 2020-04-16 LAB — HEPATIC FUNCTION PANEL
ALT: 41 U/L (ref 0–53)
AST: 34 U/L (ref 0–37)
Albumin: 4.2 g/dL (ref 3.5–5.2)
Alkaline Phosphatase: 114 U/L (ref 39–117)
Bilirubin, Direct: 0.1 mg/dL (ref 0.0–0.3)
Total Bilirubin: 0.6 mg/dL (ref 0.2–1.2)
Total Protein: 6.6 g/dL (ref 6.0–8.3)

## 2020-04-16 LAB — VITAMIN B12: Vitamin B-12: 932 pg/mL — ABNORMAL HIGH (ref 211–911)

## 2020-04-16 LAB — CBC WITH DIFFERENTIAL/PLATELET
Basophils Absolute: 0.3 10*3/uL — ABNORMAL HIGH (ref 0.0–0.1)
Basophils Relative: 2.3 % (ref 0.0–3.0)
Eosinophils Absolute: 0.5 10*3/uL (ref 0.0–0.7)
Eosinophils Relative: 3.8 % (ref 0.0–5.0)
HCT: 45.2 % (ref 39.0–52.0)
Hemoglobin: 15.1 g/dL (ref 13.0–17.0)
Lymphocytes Relative: 18.1 % (ref 12.0–46.0)
Lymphs Abs: 2.6 10*3/uL (ref 0.7–4.0)
MCHC: 33.5 g/dL (ref 30.0–36.0)
MCV: 93.7 fl (ref 78.0–100.0)
Monocytes Absolute: 0.8 10*3/uL (ref 0.1–1.0)
Monocytes Relative: 5.5 % (ref 3.0–12.0)
Neutro Abs: 10.1 10*3/uL — ABNORMAL HIGH (ref 1.4–7.7)
Neutrophils Relative %: 70.3 % (ref 43.0–77.0)
Platelets: 311 10*3/uL (ref 150.0–400.0)
RBC: 4.82 Mil/uL (ref 4.22–5.81)
RDW: 16.7 % — ABNORMAL HIGH (ref 11.5–15.5)
WBC: 14.4 10*3/uL — ABNORMAL HIGH (ref 4.0–10.5)

## 2020-04-16 LAB — BASIC METABOLIC PANEL
BUN: 11 mg/dL (ref 6–23)
CO2: 28 mEq/L (ref 19–32)
Calcium: 9.6 mg/dL (ref 8.4–10.5)
Chloride: 104 mEq/L (ref 96–112)
Creatinine, Ser: 0.89 mg/dL (ref 0.40–1.50)
GFR: 82.76 mL/min (ref >60.00)
Glucose, Bld: 147 mg/dL — ABNORMAL HIGH (ref 70–99)
Potassium: 4.4 mEq/L (ref 3.5–5.1)
Sodium: 138 mEq/L (ref 135–145)

## 2020-04-16 LAB — HEMOGLOBIN A1C: Hgb A1c MFr Bld: 7.4 % — ABNORMAL HIGH (ref 4.6–6.5)

## 2020-04-16 LAB — TSH: TSH: 1.84 u[IU]/mL (ref 0.35–4.50)

## 2020-04-16 MED ORDER — METFORMIN HCL 500 MG PO TABS: 500.0000 mg | ORAL_TABLET | Freq: Two times a day (BID) | ORAL | 11 refills | Status: DC

## 2020-04-16 MED ORDER — SILDENAFIL CITRATE 50 MG PO TABS: 50.0000 mg | ORAL_TABLET | Freq: Every day | ORAL | 5 refills | Status: DC | PRN

## 2020-04-16 NOTE — Progress Notes (Signed)
   Subjective:    Patient ID: Bill [name], male    DOB: 08/27/1941, 79 y.o.   MRN: [id number]  HPI Here to follow up. He feels well in general. No recent palpitations or SOB. He does not check his glucoses at home.    Review of Systems  Constitutional: Negative.   Respiratory: Negative.   Cardiovascular: Negative.        Objective:   Physical Exam Constitutional:      Appearance: Normal appearance.  Cardiovascular:     Rate and Rhythm: Normal rate and regular rhythm.     Pulses: Normal pulses.     Heart sounds: Normal heart sounds.  Pulmonary:     Effort: Pulmonary effort is normal.     Breath sounds: Normal breath sounds.  Neurological:     Mental Status: He is alert.           Assessment & Plan:  He seems to be doing well. He is fasting so we will check labs including an A1c.  Alysia Penna, MD

## 2020-04-19 ENCOUNTER — Other Ambulatory Visit: Payer: Self-pay

## 2020-04-19 ENCOUNTER — Other Ambulatory Visit: Payer: Self-pay | Admitting: Hematology and Oncology

## 2020-04-19 ENCOUNTER — Inpatient Hospital Stay: Payer: Medicare HMO | Attending: Hematology and Oncology

## 2020-04-19 ENCOUNTER — Inpatient Hospital Stay: Payer: Medicare HMO | Admitting: Hematology and Oncology

## 2020-04-19 VITALS — HR 56 | Temp 97.5°F | Resp 18 | Ht 67.0 in | Wt 170.7 lb

## 2020-04-19 DIAGNOSIS — J449 Chronic obstructive pulmonary disease, unspecified: Secondary | ICD-10-CM | POA: Insufficient documentation

## 2020-04-19 DIAGNOSIS — D72825 Bandemia: Secondary | ICD-10-CM

## 2020-04-19 DIAGNOSIS — Z8042 Family history of malignant neoplasm of prostate: Secondary | ICD-10-CM | POA: Insufficient documentation

## 2020-04-19 DIAGNOSIS — Z7982 Long term (current) use of aspirin: Secondary | ICD-10-CM | POA: Diagnosis not present

## 2020-04-19 DIAGNOSIS — Z800 Family history of malignant neoplasm of digestive organs: Secondary | ICD-10-CM | POA: Insufficient documentation

## 2020-04-19 DIAGNOSIS — I2510 Atherosclerotic heart disease of native coronary artery without angina pectoris: Secondary | ICD-10-CM | POA: Diagnosis not present

## 2020-04-19 DIAGNOSIS — E119 Type 2 diabetes mellitus without complications: Secondary | ICD-10-CM | POA: Insufficient documentation

## 2020-04-19 DIAGNOSIS — Z79899 Other long term (current) drug therapy: Secondary | ICD-10-CM | POA: Insufficient documentation

## 2020-04-19 DIAGNOSIS — Z7984 Long term (current) use of oral hypoglycemic drugs: Secondary | ICD-10-CM | POA: Diagnosis not present

## 2020-04-19 DIAGNOSIS — Z85828 Personal history of other malignant neoplasm of skin: Secondary | ICD-10-CM | POA: Insufficient documentation

## 2020-04-19 DIAGNOSIS — D471 Chronic myeloproliferative disease: Secondary | ICD-10-CM

## 2020-04-19 DIAGNOSIS — D72829 Elevated white blood cell count, unspecified: Secondary | ICD-10-CM | POA: Diagnosis not present

## 2020-04-19 LAB — CMP (CANCER CENTER ONLY)
ALT: 48 U/L — ABNORMAL HIGH (ref 0–44)
AST: 34 U/L (ref 15–41)
Albumin: 3.7 g/dL (ref 3.5–5.0)
Alkaline Phosphatase: 117 U/L (ref 38–126)
Anion gap: 10 (ref 5–15)
BUN: 8 mg/dL (ref 8–23)
CO2: 25 mmol/L (ref 22–32)
Calcium: 8.8 mg/dL — ABNORMAL LOW (ref 8.9–10.3)
Chloride: 106 mmol/L (ref 98–111)
Creatinine: 0.98 mg/dL (ref 0.61–1.24)
GFR, Est AFR Am: >60 mL/min (ref >60)
GFR, Estimated: >60 mL/min (ref >60)
Glucose, Bld: 137 mg/dL — ABNORMAL HIGH (ref 70–99)
Potassium: 4.5 mmol/L (ref 3.5–5.1)
Sodium: 141 mmol/L (ref 135–145)
Total Bilirubin: 0.5 mg/dL (ref 0.3–1.2)
Total Protein: 6.9 g/dL (ref 6.5–8.1)

## 2020-04-19 LAB — CBC WITH DIFFERENTIAL (CANCER CENTER ONLY)
Abs Immature Granulocytes: 0.1 10*3/uL — ABNORMAL HIGH (ref 0.00–0.07)
Basophils Absolute: 0.3 10*3/uL — ABNORMAL HIGH (ref 0.0–0.1)
Basophils Relative: 2 %
Eosinophils Absolute: 0.7 10*3/uL — ABNORMAL HIGH (ref 0.0–0.5)
Eosinophils Relative: 5 %
HCT: 45.9 % (ref 39.0–52.0)
Hemoglobin: 15.1 g/dL (ref 13.0–17.0)
Immature Granulocytes: 1 %
Lymphocytes Relative: 20 %
Lymphs Abs: 2.6 10*3/uL (ref 0.7–4.0)
MCH: 31.5 pg (ref 26.0–34.0)
MCHC: 32.9 g/dL (ref 30.0–36.0)
MCV: 95.6 fL (ref 80.0–100.0)
Monocytes Absolute: 0.8 10*3/uL (ref 0.1–1.0)
Monocytes Relative: 6 %
Neutro Abs: 9 10*3/uL — ABNORMAL HIGH (ref 1.7–7.7)
Neutrophils Relative %: 66 %
Platelet Count: 308 10*3/uL (ref 150–400)
RBC: 4.8 MIL/uL (ref 4.22–5.81)
RDW: 15.7 % — ABNORMAL HIGH (ref 11.5–15.5)
WBC Count: 13.5 10*3/uL — ABNORMAL HIGH (ref 4.0–10.5)
nRBC: 0 % (ref 0.0–0.2)

## 2020-04-19 NOTE — Progress Notes (Signed)
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 Lorenso Courier. WBC 16.4, Hgb 15.6, MCV 96.7, Plt 294. MPN panel returns JAK2 positive  Interval History:  Bill [name] 79 y.o. male with medical history significant for JAK2 positive leukocytosis who presents for a follow up visit. The patient's last visit was on 02/15/2020 at which time he established care. In the interim since the last visit he has had no hospitalizations, ED visits, or other medical issues.  On exam today Bill [name] is quite talkative.  He is easily redirected however.  He notes that he feels quite good and that he has been able to do 2 to 3 hours of work per day in the yard or doing other random activities.  He is accompanied by his wife who notes that he is quite active and does not appear to have any limitations in his day-to-day activity.  He does note that for the first time in quite a while the pollen has been bothering him more than usual with some runny nose and some occasional cough.  He currently denies having any issues with fevers, chills, sweats, nausea, vomiting or diarrhea.  His weight has been stable and his appetite is been good.  A full 10 point ROS is listed below.  MEDICAL HISTORY:  Past Medical History:  Diagnosis Date  . Abnormal nuclear stress test 06/02/2019  . Anemia   . Anxiety   . CAD (coronary artery disease) 07/21/2016  . Cardiac arrhythmia   . COPD (chronic obstructive pulmonary disease) ([place])   . COPD  (chronic obstructive pulmonary disease) with emphysema ([place]) 12/14/2017  . Frequent headaches 04/14/2018  . Helicobacter pylori gastritis 07/21/2016  . Penicillin allergy 07/21/2016  . Pneumonia   . Right groin pain 06/16/2019  . Skin cancer   . Type 2 diabetes mellitus with hyperglycemia, without long-term current use of insulin ([place]) 03/28/2019    SURGICAL HISTORY: Past Surgical History:  Procedure Laterality Date  . APPENDECTOMY    . back fusion    . cataract surgery Left   . CHOLECYSTECTOMY    . COLONOSCOPY  2014   clear   . LEFT HEART CATH AND CORONARY ANGIOGRAPHY N/A 06/09/2019   Procedure: LEFT HEART CATH AND CORONARY ANGIOGRAPHY;  Surgeon: Jettie Booze, MD;  Location: [facility];  Service: Cardiovascular;  Laterality: N/A;  . SPINE SURGERY  1984   thoracic spine fusion     SOCIAL HISTORY: Social History   Socioeconomic History  . Marital status: Significant Other    Spouse name: Not on file  . Number of children: 3  . Years of education: 7  . Highest education level: Not on file  Occupational History  . Occupation: retired    Fish farm manager: [employer].  Tobacco Use  . Smoking status: Former Research scientist (life sciences)  . Smokeless tobacco: Never Used  Vaping Use  . Vaping Use: Never used  Substance and Sexual Activity  . Alcohol use: No  . Drug use: No  . Sexual activity:  Not on file  Other Topics Concern  . Not on file  Social History Narrative   Lives with significant other in a 2 story home.  Has 3 children.  Retired.  Education: 7th grade.    Social Determinants of Health   Financial Resource Strain:   . Difficulty of Paying Living Expenses:   Food Insecurity:   . Worried About Charity fundraiser in the Last Year:   . Arboriculturist in the Last Year:   Transportation Needs: No Transportation Needs  . Lack of Transportation (Medical): No  . Lack of Transportation (Non-Medical): No  Physical Activity:   . Days of Exercise per Week:   . Minutes of  Exercise per Session:   Stress:   . Feeling of Stress :   Social Connections:   . Frequency of Communication with Friends and Family:   . Frequency of Social Gatherings with Friends and Family:   . Attends Religious Services:   . Active Member of Clubs or Organizations:   . Attends Archivist Meetings:   Marland Kitchen Marital Status:   Intimate Partner Violence: Not At Risk  . Fear of Current or Ex-Partner: No  . Emotionally Abused: No  . Physically Abused: No  . Sexually Abused: No    FAMILY HISTORY: Family History  Problem Relation Age of Onset  . Colon cancer Mother   . Diabetes Father   . Heart disease Father   . Stroke Father   . Hypertension Father   . Hyperlipidemia Father   . Heart disease Brother   . Pancreatic cancer Cousin        x 2  . Prostate cancer Cousin   . Heart disease Son     ALLERGIES:  is allergic to aspirin, oxycodone, penicillins, percocet [oxycodone-acetaminophen], ciprofloxacin, and prednisone.  MEDICATIONS:  Current Outpatient Medications  Medication Sig Dispense Refill  . aspirin 81 MG tablet Take 81 mg by mouth daily.    . bisoprolol (ZEBETA) 5 MG tablet Take 0.5 tablets (2.5 mg total) by mouth every Monday, Wednesday, and Friday. 45 tablet 3  . blood glucose meter kit and supplies Dispense based on patient and insurance preference. Use up to four times daily as directed. (FOR ICD-10 E10.9, E11.9). 1 each 0  . Calcium Citrate-Vitamin D (CALCIUM CITRATE +D PO) Take 1 tablet by mouth daily.     . carboxymethylcellulose (REFRESH PLUS) 0.5 % SOLN Place 1 drop into both eyes daily as needed (dry eyes).     . cyanocobalamin 1000 MCG tablet Take 1,000 mcg by mouth daily. (Patient not taking: Reported on 04/19/2020)    . loratadine (CLARITIN) 10 MG tablet Take 10 mg by mouth daily.    . metFORMIN (GLUCOPHAGE) 500 MG tablet Take 1 tablet (500 mg total) by mouth 2 (two) times daily with a meal. 60 tablet 11  . nitroGLYCERIN (NITROSTAT) 0.4 MG SL tablet  Place 1 tablet (0.4 mg total) under the tongue every 5 (five) minutes as needed for chest pain. 25 tablet 5  . sildenafil (VIAGRA) 50 MG tablet Take 1 tablet (50 mg total) by mouth daily as needed for erectile dysfunction. 10 tablet 5   Current Facility-Administered Medications  Medication Dose Route Frequency Provider Last Rate Last Admin  . 0.9 %  sodium chloride infusion  500 mL Intravenous Continuous Milus Banister, MD        REVIEW OF SYSTEMS:   Constitutional: ( - ) fevers, ( - )  chills , ( - )  night sweats Eyes: ( - ) blurriness of vision, ( - ) double vision, ( - ) watery eyes Ears, nose, mouth, throat, and face: ( - ) mucositis, ( - ) sore throat Respiratory: ( - ) cough, ( - ) dyspnea, ( - ) wheezes Cardiovascular: ( - ) palpitation, ( - ) chest discomfort, ( - ) lower extremity swelling Gastrointestinal:  ( - ) nausea, ( - ) heartburn, ( - ) change in bowel habits Skin: ( - ) abnormal skin rashes Lymphatics: ( - ) new lymphadenopathy, ( - ) easy bruising Neurological: ( - ) numbness, ( - ) tingling, ( - ) new weaknesses Behavioral/Psych: ( - ) mood change, ( - ) new changes  All other systems were reviewed with the patient and are negative.  PHYSICAL EXAMINATION: ECOG PERFORMANCE STATUS: 0 - Asymptomatic  Vitals:   04/19/20 1403  Pulse: (!) 56  Resp: 18  Temp: (!) 97.5 F (36.4 C)  SpO2: 100%   Filed Weights   04/19/20 1403  Weight: 170 lb 11.2 oz (77.4 kg)    GENERAL: well appearing elderly Caucasian male. alert, no distress and comfortable SKIN: skin color, texture, turgor are normal, no rashes or significant lesions EYES: conjunctiva are pink and non-injected, sclera clear LUNGS: clear to auscultation and percussion with normal breathing effort HEART: regular rate & rhythm and no murmurs and no lower extremity edema Musculoskeletal: no cyanosis of digits and no clubbing  PSYCH: alert & oriented x 3, fluent speech NEURO: no focal motor/sensory  deficits  LABORATORY DATA:  I have reviewed the data as listed CBC Latest Ref Rng & Units 04/19/2020 04/16/2020 02/15/2020  WBC 4.0 - 10.5 K/uL 13.5(H) 14.4(H) 16.4(H)  Hemoglobin 13.0 - 17.0 g/dL 15.1 15.1 15.6  Hematocrit 39 - 52 % 45.9 45.2 47.2  Platelets 150 - 400 K/uL 308 311.0 294    CMP Latest Ref Rng & Units 04/19/2020 04/16/2020 02/15/2020  Glucose 70 - 99 mg/dL 137(H) 147(H) 164(H)  BUN 8 - 23 mg/dL 8 11 10   Creatinine 0.61 - 1.24 mg/dL 0.98 0.89 0.90  Sodium 135 - 145 mmol/L 141 138 138  Potassium 3.5 - 5.1 mmol/L 4.5 4.4 3.9  Chloride 98 - 111 mmol/L 106 104 107  CO2 22 - 32 mmol/L 25 28 22   Calcium 8.9 - 10.3 mg/dL 8.8(L) 9.6 9.0  Total Protein 6.5 - 8.1 g/dL 6.9 6.6 6.8  Total Bilirubin 0.3 - 1.2 mg/dL 0.5 0.6 0.6  Alkaline Phos 38 - 126 U/L 117 114 95  AST 15 - 41 U/L 34 34 24  ALT 0 - 44 U/L 48(H) 41 36    RADIOGRAPHIC STUDIES: No results found.  ASSESSMENT & PLAN Bill [name] 79 y.o. male with medical history significant for JAK2 positive leukocytosis who presents for a follow up visit.  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  The exact myeloproliferative neoplasm is not clear at this time as the patient only has an elevation of white blood cell count with no elevation in his platelets or red blood cells.  Given this the patient does not require cytoreductive therapy and can be managed on baby aspirin alone, which she is already taking for his cardiac disease.  Technically in order to confirm the diagnosis of an MPN a bone marrow biopsy is required.  In this case it would confirm the diagnosis, however  it would not change our management.  As his counts are currently within normal limits he would not require hydroxyurea or other treatments in order to lower the counts.  As such the bone marrow biopsy will be predominantly  academic.  Myelofibrosis is also a consideration, however given that there is no disease altering treatment we could wait until there was a drop in his blood counts before considering the bone marrow biopsy.  Overall I think we can continue with observation in order to assure that the patient does not convert into a leukemia or a myelofibrosis with cytopenias.  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 He has an MPN, but the exact one is not clear based off the peripheral blood finds.  --a bone marrow biopsy can be done to confirm a diagnosis, but with no elevated RBC or Plt counts we would not start cytoreductive therapy. The utility the bone marrow is for diagnosis only, it would not change the course of treatment --myelofibrosis is certainly on the differential, but as there is no treatment that alters the course of the disease it would be reasonable to hold until the patient began developing cytopenias before considering bone marrow biopsy. --continue ASA 32m PO daily (patient takes for cardiac disease) --RTC in 6 months for continued monitoring   No orders of the defined types were placed in this encounter.  All questions were answered. The patient knows to call the clinic with any problems, questions or concerns.  A total of more than 30 minutes were spent on this encounter and over half of that time was spent on counseling and coordination of care as outlined above.   JLedell Peoples MD Department of Hematology/Oncology [facility]Phone: [phone]: [phone]: [email or web address]  04/23/2020 6:16 PM

## 2020-04-22 ENCOUNTER — Telehealth: Payer: Self-pay | Admitting: Hematology and Oncology

## 2020-04-22 NOTE — Telephone Encounter (Signed)
Scheduled per 6/25 los. Pt is aware of appt times and date.

## 2020-04-23 ENCOUNTER — Encounter: Payer: Self-pay | Admitting: Hematology and Oncology

## 2020-04-30 DIAGNOSIS — H353122 Nonexudative age-related macular degeneration, left eye, intermediate dry stage: Secondary | ICD-10-CM | POA: Diagnosis not present

## 2020-04-30 DIAGNOSIS — H16223 Keratoconjunctivitis sicca, not specified as Sjogren's, bilateral: Secondary | ICD-10-CM | POA: Diagnosis not present

## 2020-04-30 DIAGNOSIS — H35363 Drusen (degenerative) of macula, bilateral: Secondary | ICD-10-CM | POA: Diagnosis not present

## 2020-04-30 DIAGNOSIS — H43813 Vitreous degeneration, bilateral: Secondary | ICD-10-CM | POA: Diagnosis not present

## 2020-04-30 DIAGNOSIS — E119 Type 2 diabetes mellitus without complications: Secondary | ICD-10-CM | POA: Diagnosis not present

## 2020-04-30 DIAGNOSIS — H3554 Dystrophies primarily involving the retinal pigment epithelium: Secondary | ICD-10-CM | POA: Diagnosis not present

## 2020-04-30 DIAGNOSIS — Z961 Presence of intraocular lens: Secondary | ICD-10-CM | POA: Diagnosis not present

## 2020-04-30 DIAGNOSIS — Z7984 Long term (current) use of oral hypoglycemic drugs: Secondary | ICD-10-CM | POA: Diagnosis not present

## 2020-04-30 DIAGNOSIS — H353111 Nonexudative age-related macular degeneration, right eye, early dry stage: Secondary | ICD-10-CM | POA: Diagnosis not present

## 2020-05-09 ENCOUNTER — Telehealth: Payer: Self-pay | Admitting: Family Medicine

## 2020-05-09 ENCOUNTER — Emergency Department (HOSPITAL_COMMUNITY)
Admission: EM | Admit: 2020-05-09 | Discharge: 2020-05-09 | Disposition: A | Discharge status: Home / Self Care | Payer: Medicare HMO | Attending: Emergency Medicine | Admitting: Emergency Medicine

## 2020-05-09 ENCOUNTER — Other Ambulatory Visit: Payer: Self-pay

## 2020-05-09 DIAGNOSIS — Z87891 Personal history of nicotine dependence: Secondary | ICD-10-CM | POA: Diagnosis not present

## 2020-05-09 DIAGNOSIS — R1032 Left lower quadrant pain: Secondary | ICD-10-CM | POA: Insufficient documentation

## 2020-05-09 DIAGNOSIS — Z85828 Personal history of other malignant neoplasm of skin: Secondary | ICD-10-CM | POA: Insufficient documentation

## 2020-05-09 DIAGNOSIS — J449 Chronic obstructive pulmonary disease, unspecified: Secondary | ICD-10-CM | POA: Diagnosis not present

## 2020-05-09 DIAGNOSIS — R58 Hemorrhage, not elsewhere classified: Secondary | ICD-10-CM

## 2020-05-09 DIAGNOSIS — R319 Hematuria, unspecified: Secondary | ICD-10-CM | POA: Insufficient documentation

## 2020-05-09 DIAGNOSIS — Z79899 Other long term (current) drug therapy: Secondary | ICD-10-CM | POA: Insufficient documentation

## 2020-05-09 DIAGNOSIS — Z7982 Long term (current) use of aspirin: Secondary | ICD-10-CM | POA: Insufficient documentation

## 2020-05-09 DIAGNOSIS — N5089 Other specified disorders of the male genital organs: Secondary | ICD-10-CM | POA: Diagnosis not present

## 2020-05-09 DIAGNOSIS — I2510 Atherosclerotic heart disease of native coronary artery without angina pectoris: Secondary | ICD-10-CM | POA: Insufficient documentation

## 2020-05-09 DIAGNOSIS — E1165 Type 2 diabetes mellitus with hyperglycemia: Secondary | ICD-10-CM | POA: Diagnosis not present

## 2020-05-09 DIAGNOSIS — Z7984 Long term (current) use of oral hypoglycemic drugs: Secondary | ICD-10-CM | POA: Insufficient documentation

## 2020-05-09 LAB — CBC
HCT: 46.2 % (ref 39.0–52.0)
Hemoglobin: 14.8 g/dL (ref 13.0–17.0)
MCH: 30.5 pg (ref 26.0–34.0)
MCHC: 32 g/dL (ref 30.0–36.0)
MCV: 95.1 fL (ref 80.0–100.0)
Platelets: 327 10*3/uL (ref 150–400)
RBC: 4.86 MIL/uL (ref 4.22–5.81)
RDW: 16 % — ABNORMAL HIGH (ref 11.5–15.5)
WBC: 13.8 10*3/uL — ABNORMAL HIGH (ref 4.0–10.5)
nRBC: 0 % (ref 0.0–0.2)

## 2020-05-09 LAB — COMPREHENSIVE METABOLIC PANEL
ALT: 37 U/L (ref 0–44)
AST: 30 U/L (ref 15–41)
Albumin: 3.4 g/dL — ABNORMAL LOW (ref 3.5–5.0)
Alkaline Phosphatase: 88 U/L (ref 38–126)
Anion gap: 10 (ref 5–15)
BUN: 9 mg/dL (ref 8–23)
CO2: 23 mmol/L (ref 22–32)
Calcium: 9.1 mg/dL (ref 8.9–10.3)
Chloride: 106 mmol/L (ref 98–111)
Creatinine, Ser: 0.93 mg/dL (ref 0.61–1.24)
GFR calc Af Amer: >60 mL/min (ref >60)
GFR calc non Af Amer: >60 mL/min (ref >60)
Glucose, Bld: 225 mg/dL — ABNORMAL HIGH (ref 70–99)
Potassium: 4.2 mmol/L (ref 3.5–5.1)
Sodium: 139 mmol/L (ref 135–145)
Total Bilirubin: 0.6 mg/dL (ref 0.3–1.2)
Total Protein: 6.1 g/dL — ABNORMAL LOW (ref 6.5–8.1)

## 2020-05-09 LAB — URINALYSIS, ROUTINE W REFLEX MICROSCOPIC
Bilirubin Urine: NEGATIVE
Glucose, UA: NEGATIVE mg/dL
Hgb urine dipstick: NEGATIVE
Ketones, ur: NEGATIVE mg/dL
Leukocytes,Ua: NEGATIVE
Nitrite: NEGATIVE
Protein, ur: NEGATIVE mg/dL
Specific Gravity, Urine: 1.015 (ref 1.005–1.030)
pH: 6 (ref 5.0–8.0)

## 2020-05-09 LAB — LIPASE, BLOOD: Lipase: 39 U/L (ref 11–51)

## 2020-05-09 MED ORDER — SODIUM CHLORIDE 0.9% FLUSH: 3.0000 mL | Freq: Once | INTRAVENOUS | Status: DC

## 2020-05-09 NOTE — ED Triage Notes (Signed)
C/O blood in urine along w/ lower abdominal pain, worst after urination.
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
## 2020-05-09 NOTE — Telephone Encounter (Signed)
Left message for patient to schedule Annual Wellness Visit.  Please schedule with Nurse Health Advisor Shannon Crews, RN at [facility]  

## 2020-05-09 NOTE — Discharge Instructions (Signed)
We did several blood tests today. Aside from an elevated white blood cell count which is consistent with your baseline, your lab results were not concerning. The urinalysis was not suggestive of a urinary tract infection, nor was there blood in your urine. We recommend that you follow-up with your primary care physician in the next business day to let them know that you were evaluated in the emergency department, and they can direct you to schedule your next appointment. If your condition should worsen and/or you develop chest pain, shortness of breath, heavy bleeding, etc., please return to the emergency department for evaluation.

## 2020-06-24 ENCOUNTER — Telehealth: Payer: Self-pay

## 2020-06-24 NOTE — Progress Notes (Signed)
Spoke to patient to confirmed patient telephone appointment on 06/25/2020 for CCM at 4:00 PM with Junius Argyle the Clinical pharmacist.   Patient verbalized understanding  [facility] Pharmacist Assistant [phone]

## 2020-06-25 ENCOUNTER — Ambulatory Visit: Payer: Medicare HMO

## 2020-06-25 NOTE — Chronic Care Management (AMB) (Deleted)
Chronic Care Management Pharmacy  Name: Bill [name]  MRN: [id number] DOB: Jun 08, 1941  Chief Complaint/ HPI  Bill [name],  79 y.o. , male presents for their Follow-Up CCM visit with the clinical pharmacist In office.   Patient is a retired Soil scientist and has been living at a mobile home down in [place], [place]. He is very active, but he says that he feels old now just because he cant do things like how he used to before. He loves to paint and play the guitar.   PCP : [name], MD  Their chronic conditions include: mixed dyslipidemia, type 2 diabetes, CAD, COPD  Office Visits: 04/16/20: Patient presented to Dr. Sarajane Jews for follow-up. A1c 7.4%, LDL 117. Patient started on sildenafil 50 mg daily PRN  01/15/20 OV - visit for contact dermatitis. Medrol dosepak given. Recheck in 3 months.   Consult Visit: 05/09/20: Patient presented to ED for hematuria.   02/15/20 OV ([facility]/Hematology/[facility]co) - longstanding leukocytosis. Etiology unclear. Labs ordered, Routine follow-ups in place to monitor WBC.  01/12/20 OV ([name], Cardio) - CAD non-obstructive in nature. No changes with medications. No statin due to elevated LFTs. F/u in 6 months.  Medications: Outpatient Encounter Medications as of 06/25/2020  Medication Sig  . aspirin 81 MG tablet Take 81 mg by mouth daily.  . bisoprolol (ZEBETA) 5 MG tablet Take 0.5 tablets (2.5 mg total) by mouth every Monday, Wednesday, and Friday.  . blood glucose meter kit and supplies Dispense based on patient and insurance preference. Use up to four times daily as directed. (FOR ICD-10 E10.9, E11.9).  . Calcium Citrate-Vitamin D (CALCIUM CITRATE +D PO) Take 1 tablet by mouth daily.   . carboxymethylcellulose (REFRESH PLUS) 0.5 % SOLN Place 1 drop into both eyes daily as needed (dry eyes).   . cyanocobalamin 1000 MCG tablet Take 1,000 mcg by mouth daily. (Patient not taking: Reported on 04/19/2020)  . loratadine (CLARITIN) 10 MG tablet  Take 10 mg by mouth daily.  . metFORMIN (GLUCOPHAGE) 500 MG tablet Take 1 tablet (500 mg total) by mouth 2 (two) times daily with a meal.  . nitroGLYCERIN (NITROSTAT) 0.4 MG SL tablet Place 1 tablet (0.4 mg total) under the tongue every 5 (five) minutes as needed for chest pain.  . sildenafil (VIAGRA) 50 MG tablet Take 1 tablet (50 mg total) by mouth daily as needed for erectile dysfunction.   Facility-Administered Encounter Medications as of 06/25/2020  Medication  . 0.9 %  sodium chloride infusion     Transportation Needs: No Transportation Needs  . Lack of Transportation (Medical): No  . Lack of Transportation (Non-Medical): No    Current Diagnosis/Assessment:  Goals Addressed   None    Diabetes   Recent Relevant Labs: Lab Results  Component Value Date/Time   HGBA1C 7.4 (H) 04/16/2020 02:51 PM   HGBA1C 15.0 (H) 03/28/2019 03:27 PM   GFR 82.76 04/16/2020 02:51 PM   GFR 81.92 03/28/2019 03:27 PM     Checking BG: Never  Patient has failed these meds in past: glipizide, Patient is currently uncontrolled on the following medications:  Marland Kitchen Metformin 500 mg 1 tablet BID with meals  Last diabetic Eye exam: No results found for: HMDIABEYEEXA  Last diabetic Foot exam: No results found for: HMDIABFOOTEX   Patient states ***  Plan  Continue current medications  Hypertension   BP today is:  <140/90  Office blood pressures are  BP Readings from Last 3 Encounters:  05/09/20 (!) 144/70  04/16/20 120/62  02/15/20 (!) 143/70   CMP Latest Ref Rng & Units 05/09/2020 04/19/2020 04/16/2020  Glucose 70 - 99 mg/dL 225(H) 137(H) 147(H)  BUN 8 - 23 mg/dL 9 8 11   Creatinine 0.61 - 1.24 mg/dL 0.93 0.98 0.89  Sodium 135 - 145 mmol/L 139 141 138  Potassium 3.5 - 5.1 mmol/L 4.2 4.5 4.4  Chloride 98 - 111 mmol/L 106 106 104  CO2 22 - 32 mmol/L 23 25 28   Calcium 8.9 - 10.3 mg/dL 9.1 8.8(L) 9.6  Total Protein 6.5 - 8.1 g/dL 6.1(L) 6.9 6.6  Total Bilirubin 0.3 - 1.2 mg/dL 0.6 0.5 0.6    Alkaline Phos 38 - 126 U/L 88 117 114  AST 15 - 41 U/L 30 34 34  ALT 0 - 44 U/L 37 48(H) 41     Patient has failed these meds in the past: None Patient is currently controlled on the following medications:  . Bisoprolol 5 mg 0.5 tablet every MWF  Plan  Continue current medications    Hyperlipidemia   Lipid Panel     Component Value Date/Time   CHOL 182 04/16/2020 1451   CHOL 170 01/10/2020 1549   TRIG 128.0 04/16/2020 1451   HDL 39.70 04/16/2020 1451   HDL 42 01/10/2020 1549   CHOLHDL 5 04/16/2020 1451   VLDL 25.6 04/16/2020 1451   LDLCALC 117 (H) 04/16/2020 1451   LDLCALC 110 (H) 01/10/2020 1549   LABVLDL 18 01/10/2020 1549     The ASCVD Risk score (Goff DC Jr., et al., 2013) failed to calculate for the following reasons:   The patient has a prior MI or stroke diagnosis   Patient has failed these meds in past: None Patient is currently uncontrolled on the following medications:  . None  Patient not currently on statin therapy due to elevated liver enzymes before. Will recheck and recommend statin if warranted. Discussed extensively about diet and exercise.  Plan  Continue with lifestyle modifications   CAD   Patient has failed these meds in past: None Patient is currently controlled on the following medications:   Aspirin 81 mg 1 tablet daily  Nitrostat 0.4 mg SL 1 tablet under the tongue every 5 minutes as needed for chest pain  Patient denies recent chest pain or shortness of breath. Still on aspirin daily for primary prevention.  Plan  Continue current medications   OTCs/Health Maintenance   Patient is currently controlled on the following medications: . Loratidine 10 mg 1 tablet daily . Refresh Plus 0.5% opth soln 1 drop into both eyes daily PRN for dry eyes  Plan  Continue current medications   Vaccines   Reviewed and discussed patient's vaccination history.    Immunization History  Administered Date(s) Administered  .  Influenza-Unspecified 08/17/2012  . Pneumococcal Polysaccharide-23 03/31/2002, 05/11/2016  . Tdap 06/13/2015  . Zoster 12/29/2012    Plan  Recommended patient receive shingrix vaccine in the office/pharmacy.   Medication Management   Pharmacy/Benefits: Product/process development scientist / [employer] Ms. Fraser Din helps him remember and organize his medications. He has no problems organizing them for now.  Plan  Continue current medication management strategy   Follow up: 3 month phone visit  [facility]

## 2020-07-11 ENCOUNTER — Telehealth: Payer: Self-pay

## 2020-07-11 NOTE — Progress Notes (Signed)
  I have attempted without success to contact this patient by phone three times to do his Diabetes Disease State call. I left a Voice message for patient to return my call.  Bessie Kellihan,CPA Clinical Pharmacist Assistant [phone]   

## 2020-07-17 ENCOUNTER — Ambulatory Visit: Payer: Medicare HMO | Admitting: Cardiology

## 2020-07-17 ENCOUNTER — Encounter: Payer: Self-pay | Admitting: Cardiology

## 2020-07-17 ENCOUNTER — Other Ambulatory Visit: Payer: Self-pay

## 2020-07-17 VITALS — BP 136/80 | HR 62 | Ht 67.0 in | Wt 169.0 lb

## 2020-07-17 DIAGNOSIS — E1165 Type 2 diabetes mellitus with hyperglycemia: Secondary | ICD-10-CM

## 2020-07-17 DIAGNOSIS — I2510 Atherosclerotic heart disease of native coronary artery without angina pectoris: Secondary | ICD-10-CM | POA: Diagnosis not present

## 2020-07-17 DIAGNOSIS — E782 Mixed hyperlipidemia: Secondary | ICD-10-CM | POA: Diagnosis not present

## 2020-07-17 DIAGNOSIS — J439 Emphysema, unspecified: Secondary | ICD-10-CM | POA: Diagnosis not present

## 2020-07-17 MED ORDER — ATORVASTATIN CALCIUM 10 MG PO TABS
10.0000 mg | ORAL_TABLET | Freq: Every day | ORAL | 3 refills | Status: DC
Start: 2020-07-17 — End: 2020-08-01

## 2020-07-17 NOTE — Patient Instructions (Addendum)
Medication Instructions:  Your physician has recommended you make the following change in your medication:   Start Lipitor 10 mg daily.  *If you need a refill on your cardiac medications before your next appointment, please call your pharmacy*   Lab Work: Your physician recommends that you return for lab work in: 6 weeks (08/29/2020). You need to have labs done when you are fasting.  You can come Monday through Friday 8:30 am to 12:00 pm and 1:15 to 4:30. You do not need to make an appointment as the order has already been placed. The labs you are going to have done are BMET, LFT and Lipids.   If you have labs (blood work) drawn today and your tests are completely normal, you will receive your results only by: Marland Kitchen MyChart Message (if you have MyChart) OR . A paper copy in the mail If you have any lab test that is abnormal or we need to change your treatment, we will call you to review the results.   Testing/Procedures: None ordered   Follow-Up: At [facility], you and your health needs are our priority.  As part of our continuing mission to provide you with exceptional heart care, we have created designated Provider Care Teams.  These Care Teams include your primary Cardiologist (physician) and Advanced Practice Providers (APPs -  Physician Assistants and Nurse Practitioners) who all work together to provide you with the care you need, when you need it.  We recommend signing up for the patient portal called "MyChart".  Sign up information is provided on this After Visit Summary.  MyChart is used to connect with patients for Virtual Visits (Telemedicine).  Patients are able to view lab/test results, encounter notes, upcoming appointments, etc.  Non-urgent messages can be sent to your provider as well.   To learn more about what you can do with MyChart, go to [email or web address].    Your next appointment:   6 month(s)  The format for your next appointment:   In Person  Provider:    Jyl Heinz, MD   Other Instructions Atorvastatin tablets What is this medicine? ATORVASTATIN (a TORE va sta tin) is known as a HMG-CoA reductase inhibitor or 'statin'. It lowers the level of cholesterol and triglycerides in the blood. This drug may also reduce the risk of heart attack, stroke, or other health problems in patients with risk factors for heart disease. Diet and lifestyle changes are often used with this drug. This medicine may be used for other purposes; ask your health care provider or pharmacist if you have questions. COMMON BRAND NAME(S): Lipitor What should I tell my health care provider before I take this medicine? They need to know if you have any of these conditions:  diabetes  if you often drink alcohol  history of stroke  kidney disease  liver disease  muscle aches or weakness  thyroid disease  an unusual or allergic reaction to atorvastatin, other medicines, foods, dyes, or preservatives  pregnant or trying to get pregnant  breast-feeding How should I use this medicine? Take this medicine by mouth with a glass of water. Follow the directions on the prescription label. You can take it with or without food. If it upsets your stomach, take it with food. Do not take with grapefruit juice. Take your medicine at regular intervals. Do not take it more often than directed. Do not stop taking except on your doctor's advice. Talk to your pediatrician regarding the use of this medicine in children. While this  drug may be prescribed for children as young as 10 for selected conditions, precautions do apply. Overdosage: If you think you have taken too much of this medicine contact a poison control center or emergency room at once. NOTE: This medicine is only for you. Do not share this medicine with others. What if I miss a dose? If you miss a dose, take it as soon as you can. If your next dose is to be taken in less than 12 hours, then do not take the missed dose.  Take the next dose at your regular time. Do not take double or extra doses. What may interact with this medicine? Do not take this medicine with any of the following medications:  dasabuvir; ombitasvir; paritaprevir; ritonavir  ombitasvir; paritaprevir; ritonavir  posaconazole  red yeast rice This medicine may also interact with the following medications:  alcohol  birth control pills  certain antibiotics like erythromycin and clarithromycin  certain antivirals for HIV or hepatitis  certain medicines for cholesterol like fenofibrate, gemfibrozil, and niacin  certain medicines for fungal infections like ketoconazole and itraconazole  colchicine  cyclosporine  digoxin  grapefruit juice  rifampin This list may not describe all possible interactions. Give your health care provider a list of all the medicines, herbs, non-prescription drugs, or dietary supplements you use. Also tell them if you smoke, drink alcohol, or use illegal drugs. Some items may interact with your medicine. What should I watch for while using this medicine? Visit your doctor or health care professional for regular check-ups. You may need regular tests to make sure your liver is working properly. Your health care professional may tell you to stop taking this medicine if you develop muscle problems. If your muscle problems do not go away after stopping this medicine, contact your health care professional. Do not become pregnant while taking this medicine. Women should inform their health care professional if they wish to become pregnant or think they might be pregnant. There is a potential for serious side effects to an unborn child. Talk to your health care professional or pharmacist for more information. Do not breast-feed an infant while taking this medicine. This medicine may increase blood sugar. Ask your healthcare provider if changes in diet or medicines are needed if you have diabetes. If you are going  to need surgery or other procedure, tell your doctor that you are using this medicine. This drug is only part of a total heart-health program. Your doctor or a dietician can suggest a low-cholesterol and low-fat diet to help. Avoid alcohol and smoking, and keep a proper exercise schedule. This medicine may cause a decrease in Co-Enzyme Q-10. You should make sure that you get enough Co-Enzyme Q-10 while you are taking this medicine. Discuss the foods you eat and the vitamins you take with your health care professional. What side effects may I notice from receiving this medicine? Side effects that you should report to your doctor or health care professional as soon as possible:  allergic reactions like skin rash, itching or hives, swelling of the face, lips, or tongue  fever  joint pain  loss of memory  redness, blistering, peeling or loosening of the skin, including inside the mouth  signs and symptoms of high blood sugar such as being more thirsty or hungry or having to urinate more than normal. You may also feel very tired or have blurry vision.  signs and symptoms of liver injury like dark yellow or brown urine; general ill feeling or flu-like  symptoms; light-belly pain; unusually weak or tired; yellowing of the eyes or skin  signs and symptoms of muscle injury like dark urine; trouble passing urine or change in the amount of urine; unusually weak or tired; muscle pain or side or back pain Side effects that usually do not require medical attention (report to your doctor or health care professional if they continue or are bothersome):  diarrhea  nausea  stomach pain  trouble sleeping  upset stomach This list may not describe all possible side effects. Call your doctor for medical advice about side effects. You may report side effects to FDA at 1-800-FDA-1088. Where should I keep my medicine? Keep out of the reach of children. Store between 20 and 25 degrees C (68 and 77 degrees  F). Throw away any unused medicine after the expiration date. NOTE: This sheet is a summary. It may not cover all possible information. If you have questions about this medicine, talk to your doctor, pharmacist, or health care provider.  2020 Elsevier/Gold Standard (2018-08-03 11:36:16)

## 2020-07-17 NOTE — Progress Notes (Signed)
Cardiology Office Note:    Date:  07/17/2020   ID:  Bill [name], DOB [birth year], MRN [id number]  PCP:  [name], MD  Cardiologist:  Jenean Lindau, MD   Referring MD: [name], MD    ASSESSMENT:    1. Coronary artery disease involving native coronary artery of native heart without angina pectoris   2. Type 2 diabetes mellitus with hyperglycemia, without long-term current use of insulin (HCC)   3. Mixed dyslipidemia    PLAN:    In order of problems listed above:  1. Coronary artery disease: Secondary prevention stressed with patient.  Importance of compliance with diet medication stressed and he vocalized understanding. 2. Essential hypertension: Blood pressure stable and diet and lifestyle modification was emphasized 3. Mixed dyslipidemia: Diet was emphasized.  His LFTs are fine and I told him to start atorvastatin 10 mg daily.  Blood work from recent was reviewed.  Please explain any vocalized understanding.  He will be back in 6 weeks for liver lipid check. 4. Patient will be seen in follow-up appointment in 6 months or earlier if the patient has any concerns    Medication Adjustments/Labs and Tests Ordered: Current medicines are reviewed at length with the patient today.  Concerns regarding medicines are outlined above.  No orders of the defined types were placed in this encounter.  No orders of the defined types were placed in this encounter.    No chief complaint on file.    History of Present Illness:    Bill [name] is a 79 y.o. male.  Patient has past medical history of nonobstructive coronary artery disease, essential hypertension and dyslipidemia.  He denies any problems at this time and takes care of activities of daily living.  No chest pain orthopnea or PND.  He walks on a regular basis.  At the time of my evaluation, the patient is alert awake oriented and in no distress.  He was not on statin therapy because of very mildly elevated  LFTs which are fine at this time.  Past Medical History:  Diagnosis Date  . Abnormal nuclear stress test 06/02/2019  . Anemia   . Anxiety   . CAD (coronary artery disease) 07/21/2016  . Cardiac arrhythmia   . COPD (chronic obstructive pulmonary disease) ([place])   . 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 Frequent headaches 04/14/2018  . Helicobacter pylori gastritis 07/21/2016  . Penicillin allergy 07/21/2016  . Pneumonia   . Right groin pain 06/16/2019  . Skin cancer   . Type 2 diabetes mellitus with hyperglycemia, without long-term current use of insulin ([place]) 03/28/2019    Past Surgical History:  Procedure Laterality Date  . APPENDECTOMY    . back fusion    . cataract surgery Left   . CHOLECYSTECTOMY    . COLONOSCOPY  2014   clear   . LEFT HEART CATH AND CORONARY ANGIOGRAPHY N/A 06/09/2019   Procedure: LEFT HEART CATH AND CORONARY ANGIOGRAPHY;  Surgeon: Jettie Booze, MD;  Location: [facility];  Service: Cardiovascular;  Laterality: N/A;  . SPINE SURGERY  1984   thoracic spine fusion     Current Medications: Current Meds  Medication Sig  . aspirin 81 MG tablet Take 81 mg by mouth daily.  . bisoprolol (ZEBETA) 5 MG tablet Take 0.5 tablets (2.5 mg total) by mouth every Monday, Wednesday, and Friday.  . blood glucose meter kit and supplies Dispense based on patient and  insurance preference. Use up to four times daily as directed. (FOR ICD-10 E10.9, E11.9).  . Calcium Citrate-Vitamin D (CALCIUM CITRATE +D PO) Take 1 tablet by mouth daily.   . carboxymethylcellulose (REFRESH PLUS) 0.5 % SOLN Place 1 drop into both eyes daily as needed (dry eyes).   . cyanocobalamin 1000 MCG tablet Take 1,000 mcg by mouth daily.   . fexofenadine (ALLEGRA) 180 MG tablet Take 180 mg by mouth daily.  . metFORMIN (GLUCOPHAGE) 500 MG tablet Take 500 mg by mouth daily with breakfast.  . nitroGLYCERIN (NITROSTAT) 0.4 MG SL tablet Place 1 tablet (0.4 mg  total) under the tongue every 5 (five) minutes as needed for chest pain.   Current Facility-Administered Medications for the 07/17/20 encounter (Office Visit) with Nirav Sweda, [name], MD  Medication  . 0.9 %  sodium chloride infusion     Allergies:   Aspirin, Oxycodone, Penicillins, Percocet [oxycodone-acetaminophen], Ciprofloxacin, and Prednisone   Social History   Socioeconomic History  . Marital status: Significant Other    Spouse name: Not on file  . Number of children: 3  . Years of education: 7  . Highest education level: Not on file  Occupational History  . Occupation: retired    Fish farm manager: [employer].  Tobacco Use  . Smoking status: Former Research scientist (life sciences)  . Smokeless tobacco: Never Used  Vaping Use  . Vaping Use: Never used  Substance and Sexual Activity  . Alcohol use: No  . Drug use: No  . Sexual activity: Not on file  Other Topics Concern  . Not on file  Social History Narrative   Lives with significant other in a 2 story home.  Has 3 children.  Retired.  Education: 7th grade.    Social Determinants of Health   Financial Resource Strain:   . Difficulty of Paying Living Expenses: Not on file  Food Insecurity:   . Worried About Charity fundraiser in the Last Year: Not on file  . Ran Out of Food in the Last Year: Not on file  Transportation Needs: No Transportation Needs  . Lack of Transportation (Medical): No  . Lack of Transportation (Non-Medical): No  Physical Activity:   . Days of Exercise per Week: Not on file  . Minutes of Exercise per Session: Not on file  Stress:   . Feeling of Stress : Not on file  Social Connections:   . Frequency of Communication with Friends and Family: Not on file  . Frequency of Social Gatherings with Friends and Family: Not on file  . Attends Religious Services: Not on file  . Active Member of Clubs or Organizations: Not on file  . Attends Archivist Meetings: Not on file  . Marital Status: Not on file      Family History: The patient's family history includes Colon cancer in his mother; Diabetes in his father; Heart disease in his brother, father, and son; Hyperlipidemia in his father; Hypertension in his father; Pancreatic cancer in his cousin; Prostate cancer in his cousin; Stroke in his father.  ROS:   Please see the history of present illness.    All other systems reviewed and are negative.  EKGs/Labs/Other Studies Reviewed:    The following studies were reviewed today: I discussed my findings with the patient at length.   Recent Labs: 04/16/2020: TSH 1.84 05/09/2020: ALT 37; BUN 9; Creatinine, Ser 0.93; Hemoglobin 14.8; Platelets 327; Potassium 4.2; Sodium 139  Recent Lipid Panel    Component Value Date/Time  CHOL 182 04/16/2020 1451   CHOL 170 01/10/2020 1549   TRIG 128.0 04/16/2020 1451   HDL 39.70 04/16/2020 1451   HDL 42 01/10/2020 1549   CHOLHDL 5 04/16/2020 1451   VLDL 25.6 04/16/2020 1451   LDLCALC 117 (H) 04/16/2020 1451   LDLCALC 110 (H) 01/10/2020 1549    Physical Exam:    VS:  BP 136/80   Pulse 62   Ht _0  (1.702 m)   Wt 169 lb (76.7 kg)   SpO2 95%   BMI 26.47 kg/m     Wt Readings from Last 3 Encounters:  07/17/20 169 lb (76.7 kg)  05/09/20 168 lb (76.2 kg)  04/19/20 170 lb 11.2 oz (77.4 kg)     GEN: Patient is in no acute distress HEENT: Normal NECK: No JVD; No carotid bruits LYMPHATICS: No lymphadenopathy CARDIAC: Hear sounds regular, 2/6 systolic murmur at the apex. RESPIRATORY:  Clear to auscultation without rales, wheezing or rhonchi  ABDOMEN: Soft, non-tender, non-distended MUSCULOSKELETAL:  No edema; No deformity  SKIN: Warm and dry NEUROLOGIC:  Alert and oriented x 3 PSYCHIATRIC:  Normal affect   Signed, Jenean Lindau, MD  07/17/2020 4:33 PM    [facility]

## 2020-07-26 DIAGNOSIS — G500 Trigeminal neuralgia: Secondary | ICD-10-CM

## 2020-07-26 HISTORY — DX: Trigeminal neuralgia: G50.0

## 2020-07-31 ENCOUNTER — Other Ambulatory Visit: Payer: Self-pay | Admitting: Family Medicine

## 2020-07-31 NOTE — Telephone Encounter (Signed)
The patient is needing all of his medications sent to the [facility]. Phone #: [phone] Fax #: [phone]  He needs his test strips, metformin, and all the other medcations that he takes.  I asked the patient to verify his prescriptions and he said that his girlfriend handles all of that and she was not there at the time.  Please advise

## 2020-08-01 MED ORDER — BISOPROLOL FUMARATE 5 MG PO TABS: 2.5000 mg | ORAL_TABLET | ORAL | 1 refills | Status: DC

## 2020-08-01 MED ORDER — ATORVASTATIN CALCIUM 10 MG PO TABS: 10.0000 mg | ORAL_TABLET | Freq: Every day | ORAL | 2 refills | Status: DC

## 2020-08-01 MED ORDER — METFORMIN HCL 500 MG PO TABS: 500.0000 mg | ORAL_TABLET | Freq: Every day | ORAL | 2 refills | Status: DC

## 2020-08-01 MED ORDER — NITROGLYCERIN 0.4 MG SL SUBL: 0.4000 mg | SUBLINGUAL_TABLET | SUBLINGUAL | 3 refills | Status: DC | PRN

## 2020-08-01 MED ORDER — BLOOD GLUCOSE METER KIT: PACK | 0 refills | Status: DC

## 2020-08-01 MED ORDER — FEXOFENADINE HCL 180 MG PO TABS: 180.0000 mg | ORAL_TABLET | Freq: Every day | ORAL | 2 refills | Status: AC

## 2020-08-01 NOTE — Telephone Encounter (Signed)
Refills done.

## 2020-08-01 NOTE — Addendum Note (Signed)
Addended by: Matilde Sprang on: 08/01/2020 03:50 PM   Modules accepted: Orders

## 2020-08-06 ENCOUNTER — Telehealth: Payer: Self-pay

## 2020-08-06 NOTE — Telephone Encounter (Signed)
Patient called and he put his girlfriend on the phone. Advised we received a refill fax from [facility] for Metformin and it was sent to [facility] on 08/01/20. She says they received 2 letters from [facility] that we need to give them more information before they can send any medication out. I called [employer] and they asked for Dr. Barbie Banner NPI number, then said they sent out Metformin today. I called and advised the patient and his girlfriend, they verbalized understanding.

## 2020-08-19 ENCOUNTER — Telehealth: Payer: Self-pay
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
## 2020-08-20 ENCOUNTER — Telehealth: Payer: Self-pay

## 2020-08-20 NOTE — Progress Notes (Signed)
    Chronic Care Management Pharmacy Assistant   Name: Bill [name]  MRN: [id number] DOB: February 18, 1941  Reason for Encounter: Medication Review/medication cost review.  PCP : [name], MD  Allergies:   Allergies  Allergen Reactions  . Aspirin   . Oxycodone Nausea Only    PERCOCET- weakness, dizziness, shaking, nausea  . Penicillins Hives    Did it involve swelling of the face/tongue/throat, SOB, or low BP? No Did it involve sudden or severe rash/hives, skin peeling, or any reaction on the inside of your mouth or nose? Yes Did you need to seek medical attention at a hospital or doctor's office? Yes When did it last happen?long time If all above answers are "NO", may proceed with cephalosporin use.   Marland Kitchen Percocet [Oxycodone-Acetaminophen]   . Ciprofloxacin Rash  . Prednisone Palpitations    Medications: Outpatient Encounter Medications as of 08/20/2020  Medication Sig  . aspirin 81 MG tablet Take 81 mg by mouth daily.  Marland Kitchen atorvastatin (LIPITOR) 10 MG tablet Take 1 tablet (10 mg total) by mouth daily.  . bisoprolol (ZEBETA) 5 MG tablet Take 0.5 tablets (2.5 mg total) by mouth every Monday, Wednesday, and Friday.  . blood glucose meter kit and supplies Dispense based on patient and insurance preference. Use up to four times daily as directed. (FOR ICD-10 E10.9, E11.9).  . Calcium Citrate-Vitamin D (CALCIUM CITRATE +D PO) Take 1 tablet by mouth daily.   . carboxymethylcellulose (REFRESH PLUS) 0.5 % SOLN Place 1 drop into both eyes daily as needed (dry eyes).   . cyanocobalamin 1000 MCG tablet Take 1,000 mcg by mouth daily.   . fexofenadine (ALLEGRA) 180 MG tablet Take 1 tablet (180 mg total) by mouth daily.  . metFORMIN (GLUCOPHAGE) 500 MG tablet Take 1 tablet (500 mg total) by mouth daily with breakfast.  . nitroGLYCERIN (NITROSTAT) 0.4 MG SL tablet Place 1 tablet (0.4 mg total) under the tongue every 5 (five) minutes as needed for chest pain.    Facility-Administered Encounter Medications as of 08/20/2020  Medication  . 0.9 %  sodium chloride infusion    Current Diagnosis: Patient Active Problem List   Diagnosis Date Noted  . Mixed dyslipidemia 01/12/2020  . Elevated LFTs 01/12/2020  . Right groin pain 06/16/2019  . Abnormal nuclear stress test 06/02/2019  . Type 2 diabetes mellitus with hyperglycemia, without long-term current use of insulin ([place]) 03/28/2019  . Frequent headaches 04/14/2018  . COPD (chronic obstructive pulmonary disease) with emphysema ([place]) 12/14/2017  . Helicobacter pylori gastritis 07/21/2016  . CAD (coronary artery disease) 07/21/2016    Follow-Up:  Medication Cost Review   Completed medication cost analysis for metformin 500 mg with 0.00 dollar cost for patient.  [place] Pharmacist Assistant [phone]

## 2020-08-21 ENCOUNTER — Other Ambulatory Visit: Payer: Self-pay | Admitting: Family Medicine

## 2020-08-26 ENCOUNTER — Telehealth: Payer: Self-pay | Admitting: Family Medicine

## 2020-08-26 NOTE — Telephone Encounter (Signed)
PT WILL CALL BACK. Due to schedule Medicare Annual Wellness Visit (AWV) either virtually or in office.  NO HX ; please schedule at anytime with [facility] Nurse Health Advisor 1.  This should be a 45 minute visit.

## 2020-08-27 DIAGNOSIS — M542 Cervicalgia: Secondary | ICD-10-CM | POA: Diagnosis not present

## 2020-08-27 DIAGNOSIS — M4802 Spinal stenosis, cervical region: Secondary | ICD-10-CM | POA: Diagnosis not present

## 2020-08-27 DIAGNOSIS — G500 Trigeminal neuralgia: Secondary | ICD-10-CM | POA: Diagnosis not present

## 2020-09-02 DIAGNOSIS — R519 Headache, unspecified: Secondary | ICD-10-CM | POA: Insufficient documentation

## 2020-09-02 DIAGNOSIS — G8929 Other chronic pain: Secondary | ICD-10-CM

## 2020-09-02 HISTORY — DX: Headache, unspecified: R51.9

## 2020-09-02 HISTORY — DX: Other chronic pain: G89.29

## 2020-09-25 ENCOUNTER — Other Ambulatory Visit: Payer: Self-pay | Admitting: Family Medicine

## 2020-09-26 ENCOUNTER — Ambulatory Visit: Payer: Medicare HMO | Admitting: Neurology

## 2020-09-26 ENCOUNTER — Other Ambulatory Visit: Payer: Self-pay

## 2020-09-26 ENCOUNTER — Encounter: Payer: Self-pay | Admitting: Neurology

## 2020-09-26 VITALS — BP 157/66 | HR 60 | Ht 66.0 in | Wt 170.0 lb

## 2020-09-26 DIAGNOSIS — G4486 Cervicogenic headache: Secondary | ICD-10-CM

## 2020-09-26 DIAGNOSIS — M542 Cervicalgia: Secondary | ICD-10-CM | POA: Diagnosis not present

## 2020-09-26 MED ORDER — GABAPENTIN 100 MG PO CAPS: ORAL_CAPSULE | ORAL | 0 refills | Status: DC

## 2020-09-26 NOTE — Patient Instructions (Signed)
1.  For nerve pain in the neck, start gabapentin 100mg .  Take 1 pill at bedtime for one week, then 2 pills at bedtime.  Contact me with update and for refill 2.  Follow up in 4 to 6 months.
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
## 2020-10-01 NOTE — Progress Notes (Deleted)
Subjective:   Bill [name] is a 79 y.o. male who presents for Medicare Annual/Subsequent preventive examination. Virtual Visit via Video Note  I connected with Bill [name] on 10/01/20 at  1:15 PM EST by a video enabled telemedicine application and verified that I am speaking with the correct person using two identifiers.  Location: Patient: Home  Provider: Office    I discussed the limitations of evaluation and management by telemedicine and the availability of in person appointments. The patient expressed understanding and agreed to proceed.     I discussed the assessment and treatment plan with the patient. The patient was provided an opportunity to ask questions and all were answered. The patient agreed with the plan and demonstrated an understanding of the instructions.    [name], LPN   Review of Systems    N/A       Objective:    There were no vitals filed for this visit. There is no height or weight on file to calculate BMI.  Advanced Directives 05/09/2020 08/29/2019 06/09/2019 06/24/2018 06/07/2016 06/05/2016  Does Patient Have a Medical Advance Directive? _0  No  Does patient want to make changes to medical advance directive? - - - No - Patient declined - -  Would patient like information on creating a medical advance directive? No - Patient declined Yes (MAU/Ambulatory/Procedural Areas - Information given) No - Patient declined No - Patient declined No - patient declined information No - patient declined information    Current Medications (verified) Outpatient Encounter Medications as of 10/02/2020  Medication Sig  . Ascorbic Acid (VITAMIN C) 100 MG tablet Take 100 mg by mouth daily.  Marland Kitchen aspirin 81 MG tablet Take 81 mg by mouth daily.  Marland Kitchen atorvastatin (LIPITOR) 10 MG tablet Take 1 tablet (10 mg total) by mouth daily. (Patient not taking: Reported on 09/26/2020)  . bisoprolol (ZEBETA) 5 MG tablet Take 0.5 tablets (2.5 mg total) by mouth every  Monday, Wednesday, and Friday.  . blood glucose meter kit and supplies Dispense based on patient and insurance preference. Use up to four times daily as directed. (FOR ICD-10 E10.9, E11.9).  . Calcium Citrate-Vitamin D (CALCIUM CITRATE +D PO) Take 1 tablet by mouth daily.   . carboxymethylcellulose (REFRESH PLUS) 0.5 % SOLN Place 1 drop into both eyes daily as needed (dry eyes).   . cyanocobalamin 1000 MCG tablet Take 1,000 mcg by mouth daily.   . fexofenadine (ALLEGRA) 180 MG tablet Take 1 tablet (180 mg total) by mouth daily.  Marland Kitchen gabapentin (NEURONTIN) 100 MG capsule Take 1 capsule at bedtime for one week, then 2 capsules at bedtime.  . metFORMIN (GLUCOPHAGE) 500 MG tablet Take 1 tablet (500 mg total) by mouth daily with breakfast.  . nitroGLYCERIN (NITROSTAT) 0.4 MG SL tablet Place 1 tablet (0.4 mg total) under the tongue every 5 (five) minutes as needed for chest pain.  . TRUE METRIX BLOOD GLUCOSE TEST test strip TEST UP TO FOUR TIMES DAILY AS DIRECTED   Facility-Administered Encounter Medications as of 10/02/2020  Medication  . 0.9 %  sodium chloride infusion    Allergies (verified) Aspirin, Oxycodone, Penicillins, Percocet [oxycodone-acetaminophen], Ciprofloxacin, and Prednisone   History: Past Medical History:  Diagnosis Date  . Abnormal nuclear stress test 06/02/2019  . Anemia   . Anxiety   . CAD (coronary artery disease) 07/21/2016  . Cardiac arrhythmia   . COPD (chronic obstructive pulmonary disease) ([place])   . 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 Frequent headaches 04/14/2018  . Helicobacter pylori gastritis 07/21/2016  . Penicillin allergy 07/21/2016  . Pneumonia   . Right groin pain 06/16/2019  . Skin cancer   . Type 2 diabetes mellitus with hyperglycemia, without long-term current use of insulin ([place]) 03/28/2019   Past Surgical History:  Procedure Laterality Date  . APPENDECTOMY    . back fusion    . cataract surgery Left   .  CHOLECYSTECTOMY    . COLONOSCOPY  2014   clear   . LEFT HEART CATH AND CORONARY ANGIOGRAPHY N/A 06/09/2019   Procedure: LEFT HEART CATH AND CORONARY ANGIOGRAPHY;  Surgeon: Jettie Booze, MD;  Location: [facility];  Service: Cardiovascular;  Laterality: N/A;  . SPINE SURGERY  1984   thoracic spine fusion    Family History  Problem Relation Age of Onset  . Colon cancer Mother   . Diabetes Father   . Heart disease Father   . Stroke Father   . Hypertension Father   . Hyperlipidemia Father   . Heart disease Brother   . Pancreatic cancer Cousin        x 2  . Prostate cancer Cousin   . Heart disease Son    Social History   Socioeconomic History  . Marital status: Significant Other    Spouse name: Not on file  . Number of children: 3  . Years of education: 7  . Highest education level: Not on file  Occupational History  . Occupation: retired    Fish farm manager: [employer].  Tobacco Use  . Smoking status: Former Research scientist (life sciences)  . Smokeless tobacco: Never Used  Vaping Use  . Vaping Use: Never used  Substance and Sexual Activity  . Alcohol use: No  . Drug use: No  . Sexual activity: Not on file  Other Topics Concern  . Not on file  Social History Narrative   Lives with significant other in a 2 story home.  Has 3 children.  Retired.  Education: 7th grade.    Social Determinants of Health   Financial Resource Strain:   . Difficulty of Paying Living Expenses: Not on file  Food Insecurity:   . Worried About Charity fundraiser in the Last Year: Not on file  . Ran Out of Food in the Last Year: Not on file  Transportation Needs: No Transportation Needs  . Lack of Transportation (Medical): No  . Lack of Transportation (Non-Medical): No  Physical Activity:   . Days of Exercise per Week: Not on file  . Minutes of Exercise per Session: Not on file  Stress:   . Feeling of Stress : Not on file  Social Connections:   . Frequency of Communication with Friends and  Family: Not on file  . Frequency of Social Gatherings with Friends and Family: Not on file  . Attends Religious Services: Not on file  . Active Member of Clubs or Organizations: Not on file  . Attends Archivist Meetings: Not on file  . Marital Status: Not on file    Tobacco Counseling Counseling given: Not Answered   Clinical Intake:                 Diabetic?Yes Nutrition Risk Assessment:  Has the patient had any N/V/D within the last 2 months?  {YES/NO:21197} Does the patient have any non-healing wounds?  {YES/NO:21197} Has the patient had any unintentional weight loss or weight gain?  {YES/NO:21197}  Diabetes:  Is  the patient diabetic?  Yes  If diabetic, was a CBG obtained today?  No  Did the patient bring in their glucometer from home?  No  How often do you monitor your CBG's? ***.   Financial Strains and Diabetes Management:  Are you having any financial strains with the device, your supplies or your medication? {YES/NO:21197}.  Does the patient want to be seen by Chronic Care Management for management of their diabetes?  {YES/NO:21197} Would the patient like to be referred to a Nutritionist or for Diabetic Management?  {YES/NO:21197}  Diabetic Exams:  Diabetic Eye Exam: Overdue for diabetic eye exam. Pt has been advised about the importance in completing this exam. Patient advised to call and schedule an eye exam. Diabetic Foot Exam: Overdue, Pt has been advised about the importance in completing this exam. Pt is scheduled for diabetic foot exam on next appointment with provider.          Activities of Daily Living No flowsheet data found.  Patient Care Team: [name], MD as PCP - General (Family Medicine) Germaine Pomfret, [facility] as Pharmacist (Pharmacist)  Indicate any recent Medical Services you may have received from other than [facility] providers in the past year (date may be approximate).     Assessment:   This is a routine  wellness examination for Agustin.  Hearing/Vision screen No exam data present  Dietary issues and exercise activities discussed:    Goals    . Chronic Care Management     CARE PLAN ENTRY  Current Barriers:  . Chronic Disease Management support, education, and care coordination needs related to mixed dyslipidemia and diabetes   Mixed Dyslipidemia . Pharmacist Clinical Goal(s): o Over the next 90 days, patient will work with PharmD and providers to maintain total cholesterol < 200 and LDL < 100 . Current regimen:  o None . Interventions: o Discussed importance of diet and exercise . Patient self care activities - Over the next 90 days, patient will: o Minimize intake of red meat and fast food o Keep self physically active by doing house work or yard work  Diabetes . Pharmacist Clinical Goal(s): o Over the next 90 days, patient will work with PharmD and providers to achieve A1c goal <7% . Current regimen:  o Metformin 500 mg 1 tablet twice a day with meals . Interventions: o Discussed the importance of diet and exercise . Patient self care activities - Over the next 90 days, patient will: o Check blood sugar once daily, document, and provide at future appointments o Contact provider with any episodes of hypoglycemia o Continue cutting down on sugar intake  Medication management . Pharmacist Clinical Goal(s): o Over the next 90 days, patient will work with PharmD and providers to maintain optimal medication adherence . Current pharmacy: [facility] . Interventions o Comprehensive medication review performed. o Continue current medication management strategy . Patient self care activities - Over the next 90 days, patient will: o Take medications as prescribed o Report any questions or concerns to PharmD and/or provider(s)  Initial goal documentation       Depression Screen PHQ 2/9 Scores 08/29/2019  PHQ - 2 Score 0    Fall Risk Fall Risk  09/26/2020 08/29/2019  10/13/2018 05/27/2018 04/14/2018  Falls in the past year? 1 0 0 No Yes  Number falls in past yr: 1 - - - 1  Injury with Fall? 0 - - - No  Follow up - - Falls evaluation completed - -  FALL RISK PREVENTION PERTAINING TO THE HOME:  Any stairs in or around the home? {YES/NO:21197} If so, are there any without handrails? No  Home free of loose throw rugs in walkways, pet beds, electrical cords, etc? Yes  Adequate lighting in your home to reduce risk of falls? Yes   ASSISTIVE DEVICES UTILIZED TO PREVENT FALLS:  Life alert? {YES/NO:21197} Use of a cane, walker or w/c? {YES/NO:21197} Grab bars in the bathroom? {YES/NO:21197} Shower chair or bench in shower? {YES/NO:21197} Elevated toilet seat or a handicapped toilet? {YES/NO:21197}   Cognitive Function:        Immunizations Immunization History  Administered Date(s) Administered  . Influenza-Unspecified 08/17/2012  . Pneumococcal Polysaccharide-23 03/31/2002, 05/11/2016  . Tdap 06/13/2015  . Zoster 12/29/2012    TDAP status: Up to date  {Flu Vaccine status:2101806}  Pneumococcal vaccine status: Due, Education has been provided regarding the importance of this vaccine. Advised may receive this vaccine at local pharmacy or Health Dept. Aware to provide a copy of the vaccination record if obtained from local pharmacy or Health Dept. Verbalized acceptance and understanding.  {Covid-19 vaccine status:2101808}  Qualifies for Shingles Vaccine? Yes   Zostavax completed Yes   Shingrix Completed?: No.    Education has been provided regarding the importance of this vaccine. Patient has been advised to call insurance company to determine out of pocket expense if they have not yet received this vaccine. Advised may also receive vaccine at local pharmacy or Health Dept. Verbalized acceptance and understanding.  Screening Tests Health Maintenance  Topic Date Due  . Hepatitis C Screening  Never done  . FOOT EXAM  Never done  .  OPHTHALMOLOGY EXAM  Never done  . URINE MICROALBUMIN  Never done  . COVID-19 Vaccine (1) Never done  . PNA vac Low Risk Adult (2 of 2 - PCV13) 05/11/2017  . INFLUENZA VACCINE  05/26/2020  . HEMOGLOBIN A1C  10/16/2020  . TETANUS/TDAP  06/12/2025    Health Maintenance  Health Maintenance Due  Topic Date Due  . Hepatitis C Screening  Never done  . FOOT EXAM  Never done  . OPHTHALMOLOGY EXAM  Never done  . URINE MICROALBUMIN  Never done  . COVID-19 Vaccine (1) Never done  . PNA vac Low Risk Adult (2 of 2 - PCV13) 05/11/2017  . INFLUENZA VACCINE  05/26/2020    {Colorectal cancer screening:2101809}  Lung Cancer Screening: (Low Dose CT Chest recommended if Age 54-80 years, 30 pack-year currently smoking OR have quit w/in 15years.) does not qualify.   Lung Cancer Screening Referral: NA   Additional Screening:  Hepatitis C Screening: does qualify;   Vision Screening: Recommended annual ophthalmology exams for early detection of glaucoma and other disorders of the eye. Is the patient up to date with their annual eye exam?  {YES/NO:21197} Who is the provider or what is the name of the office in which the patient attends annual eye exams? *** If pt is not established with a provider, would they like to be referred to a provider to establish care? {YES/NO:21197}.   Dental Screening: Recommended annual dental exams for proper oral hygiene  Community Resource Referral / Chronic Care Management: CRR required this visit?  No   CCM required this visit?  No      Plan:     I have personally reviewed and noted the following in the patient's chart:   . Medical and social history . Use of alcohol, tobacco or illicit drugs  . Current medications and supplements .  Functional ability and status . Nutritional status . Physical activity . Advanced directives . List of other physicians . Hospitalizations, surgeries, and ER visits in previous 12 months . Vitals . Screenings to include  cognitive, depression, and falls . Referrals and appointments  In addition, I have reviewed and discussed with patient certain preventive protocols, quality metrics, and best practice recommendations. A written personalized care plan for preventive services as well as general preventive health recommendations were provided to patient.     [name], LPN   81/05/[id number]   Nurse Notes: None

## 2020-10-02 ENCOUNTER — Other Ambulatory Visit: Payer: Self-pay | Admitting: Family Medicine

## 2020-10-02 DIAGNOSIS — Z0000 Encounter for general adult medical examination without abnormal findings: Secondary | ICD-10-CM

## 2020-10-02 NOTE — Progress Notes (Signed)
Erroneous Encounter

## 2020-10-07 ENCOUNTER — Other Ambulatory Visit: Payer: Self-pay

## 2020-10-07 ENCOUNTER — Encounter: Payer: Self-pay | Admitting: Emergency Medicine

## 2020-10-07 ENCOUNTER — Ambulatory Visit
Admission: EM | Admit: 2020-10-07 | Discharge: 2020-10-07 | Disposition: A | Discharge status: Home / Self Care | Payer: Medicare HMO | Attending: Emergency Medicine | Admitting: Emergency Medicine

## 2020-10-07 DIAGNOSIS — J209 Acute bronchitis, unspecified: Secondary | ICD-10-CM | POA: Diagnosis not present

## 2020-10-07 LAB — POCT RAPID STREP A (OFFICE): Rapid Strep A Screen: NEGATIVE

## 2020-10-07 MED ORDER — PREDNISONE 20 MG PO TABS
20.0000 mg | ORAL_TABLET | Freq: Every day | ORAL | 0 refills | Status: DC
Start: 2020-10-07 — End: 2020-10-21

## 2020-10-07 MED ORDER — AEROCHAMBER PLUS FLO-VU MEDIUM MISC: 1.0000 | Freq: Once | 0 refills | Status: AC

## 2020-10-07 MED ORDER — DOXYCYCLINE HYCLATE 100 MG PO CAPS
100.0000 mg | ORAL_CAPSULE | Freq: Two times a day (BID) | ORAL | 0 refills | Status: AC
Start: 2020-10-07 — End: 2020-10-12

## 2020-10-07 MED ORDER — BENZONATATE 100 MG PO CAPS
100.0000 mg | ORAL_CAPSULE | Freq: Three times a day (TID) | ORAL | 0 refills | Status: DC
Start: 2020-10-07 — End: 2021-01-07

## 2020-10-07 MED ORDER — ALBUTEROL SULFATE HFA 108 (90 BASE) MCG/ACT IN AERS
2.0000 | INHALATION_SPRAY | Freq: Four times a day (QID) | RESPIRATORY_TRACT | 2 refills | Status: DC | PRN
Start: 2020-10-07 — End: 2021-01-07

## 2020-10-07 NOTE — Discharge Instructions (Signed)

## 2020-10-07 NOTE — ED Triage Notes (Signed)
Patient c/o body aches, sore throat, headache x 3 days.   Patient endorses productive cough w/ "yellow" sputum and dizziness.  Patient endorses a fever of 102F at home.   Patient has taken Nyquil w/ no relief of symptoms.

## 2020-10-07 NOTE — ED Provider Notes (Signed)
[facility]    CSN: [id number] Arrival date & time: 10/07/20  1601      History   Chief Complaint Chief Complaint  Patient presents with  . Sore Throat  . Headache    HPI Bill [name] is a 79 y.o. male  Number 3-day course of myalgias, sore throat, frontal headache, productive cough.  States cough is productive with yellow sputum.  Denies hemoptysis, chest pain, shortness of breath.  Has used albuterol inhalers in the past, though does not have 1 currently.  T-max 102F at home.  Has taken NyQuil with minimal relief.  Past Medical History:  Diagnosis Date  . Abnormal nuclear stress test 06/02/2019  . Anemia   . Anxiety   . CAD (coronary artery disease) 07/21/2016  . Cardiac arrhythmia   . COPD (chronic obstructive pulmonary disease) ([place])   . COPD (chronic obstructive pulmonary disease) with emphysema ([place]) 12/14/2017  . Frequent headaches 04/14/2018  . Helicobacter pylori gastritis 07/21/2016  . Penicillin allergy 07/21/2016  . Pneumonia   . Right groin pain 06/16/2019  . Skin cancer   . Type 2 diabetes mellitus with hyperglycemia, without long-term current use of insulin ([place]) 03/28/2019    Patient Active Problem List   Diagnosis Date Noted  . Mixed dyslipidemia 01/12/2020  . Elevated LFTs 01/12/2020  . Right groin pain 06/16/2019  . Abnormal nuclear stress test 06/02/2019  . Type 2 diabetes mellitus with hyperglycemia, without long-term current use of insulin ([place]) 03/28/2019  . Frequent headaches 04/14/2018  . COPD (chronic obstructive pulmonary disease) with emphysema ([place]) 12/14/2017  . Helicobacter pylori gastritis 07/21/2016  . CAD (coronary artery disease) 07/21/2016    Past Surgical History:  Procedure Laterality Date  . APPENDECTOMY    . back fusion    . cataract surgery Left   . CHOLECYSTECTOMY    . COLONOSCOPY  2014   clear   . LEFT HEART CATH AND CORONARY ANGIOGRAPHY N/A 06/09/2019   Procedure: LEFT HEART CATH AND CORONARY  ANGIOGRAPHY;  Surgeon: Jettie Booze, MD;  Location: [facility];  Service: Cardiovascular;  Laterality: N/A;  . SPINE SURGERY  1984   thoracic spine fusion        Home Medications    Prior to Admission medications   Medication Sig Start Date End Date Taking? Authorizing Provider  aspirin 81 MG tablet Take 81 mg by mouth daily.   Yes [provider]  bisoprolol (ZEBETA) 5 MG tablet Take 0.5 tablets (2.5 mg total) by mouth every Monday, Wednesday, and Friday. 08/02/20  Yes [name], MD  Calcium Citrate-Vitamin D (CALCIUM CITRATE +D PO) Take 1 tablet by mouth daily.    Yes [provider]  metFORMIN (GLUCOPHAGE) 500 MG tablet Take 1 tablet (500 mg total) by mouth daily with breakfast. 08/01/20  Yes [name], MD  albuterol (VENTOLIN HFA) 108 (90 Base) MCG/ACT inhaler Inhale 2 puffs into the lungs every 6 (six) hours as needed for wheezing or shortness of breath. 10/07/20   Hall-Potvin, [place], PA-C  Ascorbic Acid (VITAMIN C) 100 MG tablet Take 100 mg by mouth daily.    [provider]  benzonatate (TESSALON) 100 MG capsule Take 1 capsule (100 mg total) by mouth every 8 (eight) hours. 10/07/20   Hall-Potvin, [place], PA-C  Blood Glucose Monitoring Suppl (TRUE METRIX METER) w/Device KIT USE AS DIRECTED 10/07/20   [name], MD  carboxymethylcellulose (REFRESH PLUS) 0.5 % SOLN Place 1 drop into both eyes  daily as needed (dry eyes).     [provider]  cyanocobalamin 1000 MCG tablet Take 1,000 mcg by mouth daily.     [provider]  doxycycline (VIBRAMYCIN) 100 MG capsule Take 1 capsule (100 mg total) by mouth 2 (two) times daily for 5 days. 10/07/20 10/12/20  Hall-Potvin, [place], PA-C  fexofenadine (ALLEGRA) 180 MG tablet Take 1 tablet (180 mg total) by mouth daily. 08/01/20   [name], MD  gabapentin (NEURONTIN) 100 MG capsule Take 1 capsule at bedtime for one week, then 2 capsules at bedtime. 09/26/20   [name], DO  nitroGLYCERIN (NITROSTAT) 0.4 MG SL tablet Place 1 tablet (0.4 mg total) under the tongue every 5 (five) minutes as needed for chest pain. 08/01/20   [name], MD  predniSONE (DELTASONE) 20 MG tablet Take 1 tablet (20 mg total) by mouth daily. 10/07/20   Hall-Potvin, [place], PA-C  TRUE METRIX BLOOD GLUCOSE TEST test strip TEST UP TO FOUR TIMES DAILY AS DIRECTED 09/27/20   [name], MD  atorvastatin (LIPITOR) 10 MG tablet Take 1 tablet (10 mg total) by mouth daily. Patient not taking: No sig reported 08/01/20 10/07/20  [name], MD    Family History Family History  Problem Relation Age of Onset  . Colon cancer Mother   . Diabetes Father   . Heart disease Father   . Stroke Father   . Hypertension Father   . Hyperlipidemia Father   . Heart disease Brother   . Pancreatic cancer Cousin        x 2  . Prostate cancer Cousin   . Heart disease Son     Social History Social History   Tobacco Use  . Smoking status: Former Research scientist (life sciences)  . Smokeless tobacco: Never Used  Vaping Use  . Vaping Use: Never used  Substance Use Topics  . Alcohol use: No  . Drug use: No     Allergies   Aspirin, Oxycodone, Penicillins, Percocet [oxycodone-acetaminophen], Ciprofloxacin, and Prednisone   Review of Systems Review of Systems  Constitutional: Positive for fever. Negative for fatigue.  HENT: Negative for congestion, dental problem, ear pain, facial swelling, hearing loss, sinus pain, sore throat, trouble swallowing and voice change.   Eyes: Negative for photophobia, pain and visual disturbance.  Respiratory: Positive for cough. Negative for shortness of breath.   Cardiovascular: Negative for chest pain and palpitations.  Gastrointestinal: Negative for diarrhea and vomiting.  Musculoskeletal: Negative for arthralgias and myalgias.  Neurological: Negative for dizziness and headaches.     Physical Exam Triage Vital Signs ED Triage Vitals  Enc Vitals Group     BP  10/07/20 1728 (!) 141/88     Pulse Rate 10/07/20 1728 88     Resp 10/07/20 1728 18     Temp 10/07/20 1728 99.2 F (37.3 C)     Temp Source 10/07/20 1728 Oral     SpO2 10/07/20 1728 93 %     Weight --      Height --      Head Circumference --      Peak Flow --      Pain Score 10/07/20 1725 8     Pain Loc --      Pain Edu? --      Excl. in [place]? --    No data found.  Updated Vital Signs BP (!) 141/88 (BP Location: Left Arm)   Pulse 88   Temp 99.2 F (37.3 C) (Oral)  Resp 18   SpO2 93%   Visual Acuity Right Eye Distance:   Left Eye Distance:   Bilateral Distance:    Right Eye Near:   Left Eye Near:    Bilateral Near:     Physical Exam Constitutional:      General: He is not in acute distress.    Appearance: He is not toxic-appearing or diaphoretic.  HENT:     Head: Normocephalic and atraumatic.     Right Ear: Tympanic membrane and ear canal normal.     Left Ear: Tympanic membrane and ear canal normal.     Mouth/Throat:     Mouth: Mucous membranes are moist.     Pharynx: Oropharynx is clear.  Eyes:     General: No scleral icterus.    Conjunctiva/sclera: Conjunctivae normal.     Pupils: Pupils are equal, round, and reactive to light.  Neck:     Comments: Trachea midline, negative JVD Cardiovascular:     Rate and Rhythm: Normal rate and regular rhythm.  Pulmonary:     Effort: Pulmonary effort is normal. No respiratory distress.     Breath sounds: No wheezing.  Musculoskeletal:     Cervical back: Neck supple. No tenderness.  Lymphadenopathy:     Cervical: No cervical adenopathy.  Skin:    Capillary Refill: Capillary refill takes less than 2 seconds.     Coloration: Skin is not jaundiced or pale.     Findings: No rash.  Neurological:     Mental Status: He is alert and oriented to person, place, and time.      UC Treatments / Results  Labs (all labs ordered are listed, but only abnormal results are displayed) Labs Reviewed  NOVEL CORONAVIRUS, NAA   POCT RAPID STREP A (OFFICE)    EKG   Radiology No results found.  Procedures Procedures (including critical care time)  Medications Ordered in UC Medications - No data to display  Initial Impression / Assessment and Plan / UC Course  I have reviewed the triage vital signs and the nursing notes.  Pertinent labs & imaging results that were available during my care of the patient were reviewed by me and considered in my medical decision making (see chart for details).     Patient afebrile, nontoxic, with SpO2 93%.  Strep negative.  Covid PCR pending.  Patient to quarantine until results are back.  Exam reassuring today.  We will treat supportively as outlined below.  Return precautions discussed, patient verbalized understanding and is agreeable to plan. Final Clinical Impressions(s) / UC Diagnoses   Final diagnoses:  Acute bronchitis, unspecified organism     Discharge Instructions     Tessalon for cough. Start flonase, atrovent nasal spray for nasal congestion/drainage. You can use over the counter nasal saline rinse such as neti pot for nasal congestion. Keep hydrated, your urine should be clear to pale yellow in color. Tylenol/motrin for fever and pain. Monitor for any worsening of symptoms, chest pain, shortness of breath, wheezing, swelling of the throat, go to the emergency department for further evaluation needed.     ED Prescriptions    Medication Sig Dispense Auth. Provider   albuterol (VENTOLIN HFA) 108 (90 Base) MCG/ACT inhaler Inhale 2 puffs into the lungs every 6 (six) hours as needed for wheezing or shortness of breath. 8 g Hall-Potvin, [place], PA-C   Spacer/Aero-Holding Chambers (AEROCHAMBER PLUS FLO-VU MEDIUM) MISC 1 each by Other route once for 1 dose. 1 each Hall-Potvin, [place], PA-C  benzonatate (TESSALON) 100 MG capsule Take 1 capsule (100 mg total) by mouth every 8 (eight) hours. 21 capsule Hall-Potvin, [place], PA-C   predniSONE (DELTASONE) 20 MG  tablet Take 1 tablet (20 mg total) by mouth daily. 5 tablet Hall-Potvin, [place], PA-C   doxycycline (VIBRAMYCIN) 100 MG capsule Take 1 capsule (100 mg total) by mouth 2 (two) times daily for 5 days. 10 capsule Hall-Potvin, [place], PA-C     PDMP not reviewed this encounter.   Neldon Mc [place], [place] 10/08/20 [phone]

## 2020-10-08 ENCOUNTER — Encounter: Payer: Self-pay | Admitting: Emergency Medicine

## 2020-10-09 LAB — SARS-COV-2, NAA 2 DAY TAT

## 2020-10-09 LAB — NOVEL CORONAVIRUS, NAA: SARS-CoV-2, NAA: DETECTED — AB

## 2020-10-10 ENCOUNTER — Telehealth: Payer: Self-pay | Admitting: Family

## 2020-10-10 ENCOUNTER — Encounter: Payer: Self-pay | Admitting: Family

## 2020-10-10 NOTE — Telephone Encounter (Signed)
Positive test 10/07/20 at Urgent Care. Per Urgent Care documentation he was on day 3 of symptoms at that time. Will need to confirm via phone call to determine eligibility for MAB infusion.   Called to Discuss with patient about Covid symptoms and the use of the monoclonal antibody infusion for those with mild to moderate Covid symptoms and at a high risk of hospitalization.     Pt appears to qualify for this infusion due to co-morbid conditions and/or a member of an at-risk group in accordance with the FDA Emergency Use Authorization.    Unable to reach pt. Left VM requesting call back and will send MyChart message.   Loel Dubonnet, NP

## 2020-10-11 ENCOUNTER — Telehealth: Payer: Self-pay | Admitting: Family Medicine

## 2020-10-11 NOTE — Telephone Encounter (Signed)
I spoke with pt. The infusion center tried to contact him yesterday. I gave him their phone number and advised him if he has to leave a voicemail - to leave his name & that he is returning a call to them. Patient verbalized understanding. I also advised him to call us back if he needed any further assistance / advice.

## 2020-10-11 NOTE — Telephone Encounter (Signed)
The patient went to Urgent Care on 12/13 and was diagnosed with COVID and acute Bronchitis.  They sent him home with medication for Bronchitis and nothing for COVID. He said that it's pretty bad and needs something else called in the pharmacy or at least told what he needs to do.  Please advise

## 2020-10-12 ENCOUNTER — Encounter: Payer: Self-pay | Admitting: Family Medicine

## 2020-10-12 ENCOUNTER — Telehealth (INDEPENDENT_AMBULATORY_CARE_PROVIDER_SITE_OTHER): Payer: Medicare HMO | Admitting: Family Medicine

## 2020-10-12 VITALS — Ht 66.0 in

## 2020-10-12 DIAGNOSIS — R112 Nausea with vomiting, unspecified: Secondary | ICD-10-CM | POA: Diagnosis not present

## 2020-10-12 DIAGNOSIS — U071 COVID-19: Secondary | ICD-10-CM | POA: Diagnosis not present

## 2020-10-12 MED ORDER — ONDANSETRON HCL 4 MG PO TABS: 4.0000 mg | ORAL_TABLET | Freq: Two times a day (BID) | ORAL | 0 refills | Status: AC | PRN

## 2020-10-12 NOTE — Progress Notes (Signed)
Virtual Visit via Telephone Note  I connected with Bill [name] on 10/12/20 at 10:00 AM EST by telephone and verified that I am speaking with the correct person using two identifiers.   I discussed the limitations, risks, security and privacy concerns of performing an evaluation and management service by telephone and the availability of in person appointments. I also discussed with the patient that there may be a patient responsible charge related to this service. The patient expressed understanding and agreed to proceed.  Location patient: home Location provider: work office Participants present for the call: patient, provider Patient did not have a visit in the prior 7 days to address this/these issue(s).  Chief Complaint  Patient presents with  . Covid Positive  . Bronchitis    Dx by urgent care - finishing his medicines today    History of Present Illness: [name] is a 79 year old male with history of DM 2, hyperlipidemia, COPD, and CAD with above complaint. He was evaluated in the ER on 10/07/2020, started on doxycycline 100 mg x 7 days. CXR was not done.  COVID-19 test positive reported on 10/10/2020. Monoclonal antibodies infusion was offered, she declined. States her aunt and cousin have severe reaction days after monoclonal infusion.  He is not certain that [id number]-[id number] test is accurate.  He is doing " a lot of better", which he attributes to being on antibiotics.  So he would like to continue with antibiotic treatment. He is no longer using albuterol.  Having fever on and off. Last time he checked temp was yesterday, 100.1 F, oral.  Cough is "not bad", it has improved, nonproductive. Headache, nasal congestion, rhinorrhea, and sore throat have greatly improved. Negative for CP, dyspnea, wheezing, or palpitations. Negative for anosmia and ageusia.  Nausea and vomiting, she has not had any vomiting today, yesterday she had 2 episodes. "Little" nauseated. +  Loose stool, which she attributes to decrease oral intake.  She has not had diarrhea today.  She has not noted melena or blood in the stool. Decreased appetite, food she used to like taste saltier but still eating some.  Observations/Objective: Patient sounds cheerful and well on the phone. I do not appreciate any SOB, wheezing, cough,or stridor. Speech and thought processing are grossly intact. Patient reported vitals:Ht 5\' 6"  (1.676 m)   BMI 27.44 kg/m   Assessment and Plan:  1. Nausea and vomiting in adult Improved. Recommend adequate hydration, a small sips of clear fluids throughout the day and small portions of bland meals. Caution with hypoglycemic events.  Zofran 4 mg twice daily as needed for nausea. Clearly instructed about warning signs.  2. COVID-19 virus infection Educated about the disease, prognosis, and treatment options. Explained that I do not think she needs to continue antibiotic treatment. We discussed some side effects of antibiotics, including GI symptoms.  She needs to go to the ER is respiratory symptoms get any worse.  History and how he sounds during telephone visit do not suggest serious process at this time. We could arrange CXR if she feels like she has pneumonia, states that she does not think she does.  She would like to see her PCP, f/u appt will be arranged.  Follow Up Instructions:  Return in about 4 days (around 10/16/2020) for pcp.  I did not refer this patient for an OV in the next 24 hours for this/these issue(s).  I discussed the assessment and treatment plan with the patient. He was provided an opportunity to ask questions and  all were answered. He agreed with the plan and demonstrated an understanding of the instructions.   I provided 24 minutes of non-face-to-face time during this encounter.   [name], MD

## 2020-10-16 ENCOUNTER — Inpatient Hospital Stay (HOSPITAL_COMMUNITY)
Admission: EM | Admit: 2020-10-16 | Discharge: 2020-10-21 | DRG: 177 | Disposition: A | Discharge status: Home Health | Payer: Medicare HMO | Attending: Internal Medicine | Admitting: Internal Medicine

## 2020-10-16 ENCOUNTER — Other Ambulatory Visit: Payer: Self-pay

## 2020-10-16 ENCOUNTER — Emergency Department (HOSPITAL_COMMUNITY): Payer: Medicare HMO

## 2020-10-16 ENCOUNTER — Encounter (HOSPITAL_COMMUNITY): Payer: Self-pay | Admitting: Emergency Medicine

## 2020-10-16 DIAGNOSIS — Z886 Allergy status to analgesic agent status: Secondary | ICD-10-CM

## 2020-10-16 DIAGNOSIS — Z833 Family history of diabetes mellitus: Secondary | ICD-10-CM | POA: Diagnosis not present

## 2020-10-16 DIAGNOSIS — R112 Nausea with vomiting, unspecified: Secondary | ICD-10-CM | POA: Diagnosis present

## 2020-10-16 DIAGNOSIS — J9601 Acute respiratory failure with hypoxia: Secondary | ICD-10-CM | POA: Diagnosis present

## 2020-10-16 DIAGNOSIS — E872 Acidosis: Secondary | ICD-10-CM | POA: Diagnosis present

## 2020-10-16 DIAGNOSIS — E1165 Type 2 diabetes mellitus with hyperglycemia: Secondary | ICD-10-CM | POA: Diagnosis present

## 2020-10-16 DIAGNOSIS — Z9114 Patient's other noncompliance with medication regimen: Secondary | ICD-10-CM | POA: Diagnosis not present

## 2020-10-16 DIAGNOSIS — R9431 Abnormal electrocardiogram [ECG] [EKG]: Secondary | ICD-10-CM | POA: Diagnosis not present

## 2020-10-16 DIAGNOSIS — Z880 Allergy status to penicillin: Secondary | ICD-10-CM | POA: Diagnosis not present

## 2020-10-16 DIAGNOSIS — I2510 Atherosclerotic heart disease of native coronary artery without angina pectoris: Secondary | ICD-10-CM | POA: Diagnosis present

## 2020-10-16 DIAGNOSIS — Z800 Family history of malignant neoplasm of digestive organs: Secondary | ICD-10-CM

## 2020-10-16 DIAGNOSIS — Z8249 Family history of ischemic heart disease and other diseases of the circulatory system: Secondary | ICD-10-CM | POA: Diagnosis not present

## 2020-10-16 DIAGNOSIS — J9600 Acute respiratory failure, unspecified whether with hypoxia or hypercapnia: Secondary | ICD-10-CM | POA: Diagnosis present

## 2020-10-16 DIAGNOSIS — R0902 Hypoxemia: Secondary | ICD-10-CM | POA: Diagnosis not present

## 2020-10-16 DIAGNOSIS — Z823 Family history of stroke: Secondary | ICD-10-CM | POA: Diagnosis not present

## 2020-10-16 DIAGNOSIS — I2699 Other pulmonary embolism without acute cor pulmonale: Secondary | ICD-10-CM | POA: Diagnosis not present

## 2020-10-16 DIAGNOSIS — Z87891 Personal history of nicotine dependence: Secondary | ICD-10-CM

## 2020-10-16 DIAGNOSIS — E860 Dehydration: Secondary | ICD-10-CM | POA: Diagnosis present

## 2020-10-16 DIAGNOSIS — Z8042 Family history of malignant neoplasm of prostate: Secondary | ICD-10-CM

## 2020-10-16 DIAGNOSIS — R911 Solitary pulmonary nodule: Secondary | ICD-10-CM | POA: Diagnosis not present

## 2020-10-16 DIAGNOSIS — Z85828 Personal history of other malignant neoplasm of skin: Secondary | ICD-10-CM | POA: Diagnosis not present

## 2020-10-16 DIAGNOSIS — K449 Diaphragmatic hernia without obstruction or gangrene: Secondary | ICD-10-CM | POA: Diagnosis not present

## 2020-10-16 DIAGNOSIS — I10 Essential (primary) hypertension: Secondary | ICD-10-CM | POA: Diagnosis present

## 2020-10-16 DIAGNOSIS — Z888 Allergy status to other drugs, medicaments and biological substances status: Secondary | ICD-10-CM

## 2020-10-16 DIAGNOSIS — Z66 Do not resuscitate: Secondary | ICD-10-CM | POA: Diagnosis present

## 2020-10-16 DIAGNOSIS — Z83438 Family history of other disorder of lipoprotein metabolism and other lipidemia: Secondary | ICD-10-CM | POA: Diagnosis not present

## 2020-10-16 DIAGNOSIS — Z7984 Long term (current) use of oral hypoglycemic drugs: Secondary | ICD-10-CM

## 2020-10-16 DIAGNOSIS — E782 Mixed hyperlipidemia: Secondary | ICD-10-CM | POA: Diagnosis present

## 2020-10-16 DIAGNOSIS — U071 COVID-19: Secondary | ICD-10-CM | POA: Diagnosis not present

## 2020-10-16 DIAGNOSIS — J1282 Pneumonia due to coronavirus disease 2019: Secondary | ICD-10-CM | POA: Diagnosis present

## 2020-10-16 DIAGNOSIS — J439 Emphysema, unspecified: Secondary | ICD-10-CM | POA: Diagnosis present

## 2020-10-16 DIAGNOSIS — R0602 Shortness of breath: Secondary | ICD-10-CM | POA: Diagnosis not present

## 2020-10-16 DIAGNOSIS — Z981 Arthrodesis status: Secondary | ICD-10-CM | POA: Diagnosis not present

## 2020-10-16 DIAGNOSIS — I517 Cardiomegaly: Secondary | ICD-10-CM | POA: Diagnosis not present

## 2020-10-16 DIAGNOSIS — Z881 Allergy status to other antibiotic agents status: Secondary | ICD-10-CM

## 2020-10-16 DIAGNOSIS — Z79899 Other long term (current) drug therapy: Secondary | ICD-10-CM

## 2020-10-16 DIAGNOSIS — J180 Bronchopneumonia, unspecified organism: Secondary | ICD-10-CM | POA: Diagnosis not present

## 2020-10-16 DIAGNOSIS — J189 Pneumonia, unspecified organism: Secondary | ICD-10-CM | POA: Diagnosis not present

## 2020-10-16 LAB — BASIC METABOLIC PANEL
Anion gap: 13 (ref 5–15)
BUN: 24 mg/dL — ABNORMAL HIGH (ref 8–23)
CO2: 21 mmol/L — ABNORMAL LOW (ref 22–32)
Calcium: 8.3 mg/dL — ABNORMAL LOW (ref 8.9–10.3)
Chloride: 99 mmol/L (ref 98–111)
Creatinine, Ser: 0.93 mg/dL (ref 0.61–1.24)
GFR, Estimated: >60 mL/min (ref >60)
Glucose, Bld: 204 mg/dL — ABNORMAL HIGH (ref 70–99)
Potassium: 3.3 mmol/L — ABNORMAL LOW (ref 3.5–5.1)
Sodium: 133 mmol/L — ABNORMAL LOW (ref 135–145)

## 2020-10-16 LAB — CBC
HCT: 44.5 % (ref 39.0–52.0)
Hemoglobin: 15.5 g/dL (ref 13.0–17.0)
MCH: 31.4 pg (ref 26.0–34.0)
MCHC: 34.8 g/dL (ref 30.0–36.0)
MCV: 90.3 fL (ref 80.0–100.0)
Platelets: 208 10*3/uL (ref 150–400)
RBC: 4.93 MIL/uL (ref 4.22–5.81)
RDW: 17.1 % — ABNORMAL HIGH (ref 11.5–15.5)
WBC: 11 10*3/uL — ABNORMAL HIGH (ref 4.0–10.5)
nRBC: 0 % (ref 0.0–0.2)

## 2020-10-16 NOTE — ED Triage Notes (Signed)
Pt diagnosed covid+ on 12/13, reports he's not feeling any better, c/o weakness, loss of appetite and back pain. Denies shortness of breath or fever.

## 2020-10-17 ENCOUNTER — Other Ambulatory Visit: Payer: Self-pay

## 2020-10-17 ENCOUNTER — Other Ambulatory Visit: Payer: Medicare HMO

## 2020-10-17 ENCOUNTER — Emergency Department (HOSPITAL_COMMUNITY): Payer: Medicare HMO

## 2020-10-17 ENCOUNTER — Encounter (HOSPITAL_COMMUNITY): Payer: Self-pay | Admitting: Internal Medicine

## 2020-10-17 ENCOUNTER — Ambulatory Visit: Payer: Medicare HMO | Admitting: Hematology and Oncology

## 2020-10-17 DIAGNOSIS — Z981 Arthrodesis status: Secondary | ICD-10-CM | POA: Diagnosis not present

## 2020-10-17 DIAGNOSIS — Z7984 Long term (current) use of oral hypoglycemic drugs: Secondary | ICD-10-CM | POA: Diagnosis not present

## 2020-10-17 DIAGNOSIS — Z8042 Family history of malignant neoplasm of prostate: Secondary | ICD-10-CM | POA: Diagnosis not present

## 2020-10-17 DIAGNOSIS — R911 Solitary pulmonary nodule: Secondary | ICD-10-CM | POA: Diagnosis not present

## 2020-10-17 DIAGNOSIS — E1165 Type 2 diabetes mellitus with hyperglycemia: Secondary | ICD-10-CM | POA: Diagnosis not present

## 2020-10-17 DIAGNOSIS — I517 Cardiomegaly: Secondary | ICD-10-CM | POA: Diagnosis not present

## 2020-10-17 DIAGNOSIS — Z85828 Personal history of other malignant neoplasm of skin: Secondary | ICD-10-CM | POA: Diagnosis not present

## 2020-10-17 DIAGNOSIS — Z880 Allergy status to penicillin: Secondary | ICD-10-CM | POA: Diagnosis not present

## 2020-10-17 DIAGNOSIS — J1282 Pneumonia due to coronavirus disease 2019: Secondary | ICD-10-CM | POA: Diagnosis present

## 2020-10-17 DIAGNOSIS — J9600 Acute respiratory failure, unspecified whether with hypoxia or hypercapnia: Secondary | ICD-10-CM | POA: Diagnosis not present

## 2020-10-17 DIAGNOSIS — I2510 Atherosclerotic heart disease of native coronary artery without angina pectoris: Secondary | ICD-10-CM | POA: Diagnosis not present

## 2020-10-17 DIAGNOSIS — R0602 Shortness of breath: Secondary | ICD-10-CM | POA: Diagnosis not present

## 2020-10-17 DIAGNOSIS — E782 Mixed hyperlipidemia: Secondary | ICD-10-CM | POA: Diagnosis present

## 2020-10-17 DIAGNOSIS — E860 Dehydration: Secondary | ICD-10-CM | POA: Diagnosis present

## 2020-10-17 DIAGNOSIS — U071 COVID-19: Secondary | ICD-10-CM | POA: Diagnosis present

## 2020-10-17 DIAGNOSIS — J439 Emphysema, unspecified: Secondary | ICD-10-CM | POA: Diagnosis present

## 2020-10-17 DIAGNOSIS — J9601 Acute respiratory failure with hypoxia: Secondary | ICD-10-CM | POA: Diagnosis present

## 2020-10-17 DIAGNOSIS — R0902 Hypoxemia: Secondary | ICD-10-CM | POA: Diagnosis not present

## 2020-10-17 DIAGNOSIS — R112 Nausea with vomiting, unspecified: Secondary | ICD-10-CM | POA: Diagnosis present

## 2020-10-17 DIAGNOSIS — I2699 Other pulmonary embolism without acute cor pulmonale: Secondary | ICD-10-CM | POA: Diagnosis not present

## 2020-10-17 DIAGNOSIS — I10 Essential (primary) hypertension: Secondary | ICD-10-CM | POA: Diagnosis present

## 2020-10-17 DIAGNOSIS — Z800 Family history of malignant neoplasm of digestive organs: Secondary | ICD-10-CM | POA: Diagnosis not present

## 2020-10-17 DIAGNOSIS — Z66 Do not resuscitate: Secondary | ICD-10-CM | POA: Diagnosis present

## 2020-10-17 DIAGNOSIS — Z83438 Family history of other disorder of lipoprotein metabolism and other lipidemia: Secondary | ICD-10-CM | POA: Diagnosis not present

## 2020-10-17 DIAGNOSIS — Z823 Family history of stroke: Secondary | ICD-10-CM | POA: Diagnosis not present

## 2020-10-17 DIAGNOSIS — K449 Diaphragmatic hernia without obstruction or gangrene: Secondary | ICD-10-CM | POA: Diagnosis not present

## 2020-10-17 DIAGNOSIS — Z87891 Personal history of nicotine dependence: Secondary | ICD-10-CM | POA: Diagnosis not present

## 2020-10-17 DIAGNOSIS — E872 Acidosis: Secondary | ICD-10-CM | POA: Diagnosis present

## 2020-10-17 DIAGNOSIS — Z9114 Patient's other noncompliance with medication regimen: Secondary | ICD-10-CM | POA: Diagnosis not present

## 2020-10-17 DIAGNOSIS — Z8249 Family history of ischemic heart disease and other diseases of the circulatory system: Secondary | ICD-10-CM | POA: Diagnosis not present

## 2020-10-17 DIAGNOSIS — Z833 Family history of diabetes mellitus: Secondary | ICD-10-CM | POA: Diagnosis not present

## 2020-10-17 HISTORY — DX: Pneumonia due to coronavirus disease 2019: J12.82

## 2020-10-17 HISTORY — DX: Acute respiratory failure, unspecified whether with hypoxia or hypercapnia: J96.00

## 2020-10-17 HISTORY — DX: COVID-19: U07.1

## 2020-10-17 LAB — HEPATIC FUNCTION PANEL
ALT: 36 U/L (ref 0–44)
AST: 38 U/L (ref 15–41)
Albumin: 2.7 g/dL — ABNORMAL LOW (ref 3.5–5.0)
Alkaline Phosphatase: 78 U/L (ref 38–126)
Bilirubin, Direct: 0.2 mg/dL (ref 0.0–0.2)
Indirect Bilirubin: 0.9 mg/dL (ref 0.3–0.9)
Total Bilirubin: 1.1 mg/dL (ref 0.3–1.2)
Total Protein: 6.1 g/dL — ABNORMAL LOW (ref 6.5–8.1)

## 2020-10-17 LAB — LACTIC ACID, PLASMA
Lactic Acid, Venous: 2.2 mmol/L (ref 0.5–1.9)
Lactic Acid, Venous: 1.8 mmol/L (ref 0.5–1.9)
Lactic Acid, Venous: 1.8 mmol/L (ref 0.5–1.9)

## 2020-10-17 LAB — FERRITIN: Ferritin: 1016 ng/mL — ABNORMAL HIGH (ref 24–336)

## 2020-10-17 LAB — TRIGLYCERIDES: Triglycerides: 63 mg/dL (ref <150)

## 2020-10-17 LAB — CBC
HCT: 41.5 % (ref 39.0–52.0)
Hemoglobin: 14.1 g/dL (ref 13.0–17.0)
MCH: 31.9 pg (ref 26.0–34.0)
MCHC: 34 g/dL (ref 30.0–36.0)
MCV: 93.9 fL (ref 80.0–100.0)
Platelets: 179 10*3/uL (ref 150–400)
RBC: 4.42 MIL/uL (ref 4.22–5.81)
RDW: 17.1 % — ABNORMAL HIGH (ref 11.5–15.5)
WBC: 8.8 10*3/uL (ref 4.0–10.5)
nRBC: 0 % (ref 0.0–0.2)

## 2020-10-17 LAB — CREATININE, SERUM
Creatinine, Ser: 0.83 mg/dL (ref 0.61–1.24)
GFR, Estimated: >60 mL/min (ref >60)

## 2020-10-17 LAB — FIBRINOGEN: Fibrinogen: 585 mg/dL — ABNORMAL HIGH (ref 210–475)

## 2020-10-17 LAB — GLUCOSE, CAPILLARY: Glucose-Capillary: 268 mg/dL — ABNORMAL HIGH (ref 70–99)

## 2020-10-17 LAB — C-REACTIVE PROTEIN: CRP: 5.5 mg/dL — ABNORMAL HIGH (ref <1.0)

## 2020-10-17 LAB — LACTATE DEHYDROGENASE: LDH: 304 U/L — ABNORMAL HIGH (ref 98–192)

## 2020-10-17 LAB — CBG MONITORING, ED
Glucose-Capillary: 137 mg/dL — ABNORMAL HIGH (ref 70–99)
Glucose-Capillary: 271 mg/dL — ABNORMAL HIGH (ref 70–99)

## 2020-10-17 LAB — TROPONIN I (HIGH SENSITIVITY)
Troponin I (High Sensitivity): 14 ng/L (ref <18)
Troponin I (High Sensitivity): 11 ng/L (ref <18)

## 2020-10-17 LAB — PROCALCITONIN: Procalcitonin: <0.1 ng/mL

## 2020-10-17 LAB — D-DIMER, QUANTITATIVE: D-Dimer, Quant: 0.73 ug/mL-FEU — ABNORMAL HIGH (ref 0.00–0.50)

## 2020-10-17 MED ORDER — POTASSIUM CHLORIDE CRYS ER 20 MEQ PO TBCR
20.0000 meq | EXTENDED_RELEASE_TABLET | Freq: Once | ORAL | Status: AC
  Administered 2020-10-17: 20 meq via ORAL
  Filled 2020-10-17: qty 1

## 2020-10-17 MED ORDER — IOHEXOL 350 MG/ML SOLN
75.0000 mL | Freq: Once | INTRAVENOUS | Status: AC | PRN
  Administered 2020-10-17: 75 mL via INTRAVENOUS

## 2020-10-17 MED ORDER — ZINC SULFATE 220 (50 ZN) MG PO CAPS
220.0000 mg | ORAL_CAPSULE | Freq: Every day | ORAL | Status: DC
  Administered 2020-10-17 – 2020-10-21 (×5): 220 mg via ORAL
  Filled 2020-10-17 (×5): qty 1

## 2020-10-17 MED ORDER — GUAIFENESIN-DM 100-10 MG/5ML PO SYRP
10.0000 mL | ORAL_SOLUTION | ORAL | Status: DC | PRN
  Administered 2020-10-18: 10 mL via ORAL
  Filled 2020-10-17 (×2): qty 10

## 2020-10-17 MED ORDER — ACETAMINOPHEN 650 MG RE SUPP: 650.0000 mg | Freq: Four times a day (QID) | RECTAL | Status: DC | PRN

## 2020-10-17 MED ORDER — INSULIN ASPART 100 UNIT/ML ~~LOC~~ SOLN
0.0000 [IU] | Freq: Three times a day (TID) | SUBCUTANEOUS | Status: DC
  Administered 2020-10-17: 5 [IU] via SUBCUTANEOUS

## 2020-10-17 MED ORDER — ACETAMINOPHEN 325 MG PO TABS
650.0000 mg | ORAL_TABLET | Freq: Four times a day (QID) | ORAL | Status: DC | PRN
  Administered 2020-10-18: 650 mg via ORAL
  Filled 2020-10-17: qty 2

## 2020-10-17 MED ORDER — INSULIN GLARGINE 100 UNIT/ML ~~LOC~~ SOLN
10.0000 [IU] | Freq: Every day | SUBCUTANEOUS | Status: DC
  Administered 2020-10-17: 10 [IU] via SUBCUTANEOUS
  Filled 2020-10-17 (×2): qty 0.1

## 2020-10-17 MED ORDER — ASCORBIC ACID 500 MG PO TABS
500.0000 mg | ORAL_TABLET | Freq: Every day | ORAL | Status: DC
  Administered 2020-10-17 – 2020-10-21 (×5): 500 mg via ORAL
  Filled 2020-10-17 (×5): qty 1

## 2020-10-17 MED ORDER — VITAMIN B-12 1000 MCG PO TABS
1000.0000 ug | ORAL_TABLET | Freq: Every day | ORAL | Status: DC
  Administered 2020-10-17 – 2020-10-21 (×5): 1000 ug via ORAL
  Filled 2020-10-17 (×5): qty 1

## 2020-10-17 MED ORDER — SODIUM CHLORIDE 0.9 % IV SOLN
100.0000 mg | Freq: Every day | INTRAVENOUS | Status: AC
  Administered 2020-10-18 – 2020-10-21 (×4): 100 mg via INTRAVENOUS
  Filled 2020-10-17 (×4): qty 20

## 2020-10-17 MED ORDER — SODIUM CHLORIDE 0.9 % IV SOLN
200.0000 mg | Freq: Once | INTRAVENOUS | Status: AC
  Administered 2020-10-17: 200 mg via INTRAVENOUS
  Filled 2020-10-17: qty 40

## 2020-10-17 MED ORDER — SODIUM CHLORIDE 0.9 % IV BOLUS
1000.0000 mL | Freq: Once | INTRAVENOUS | Status: AC
  Administered 2020-10-17: 1000 mL via INTRAVENOUS

## 2020-10-17 MED ORDER — SODIUM CHLORIDE 0.9 % IV BOLUS
500.0000 mL | Freq: Once | INTRAVENOUS | Status: AC
  Administered 2020-10-17: 500 mL via INTRAVENOUS

## 2020-10-17 MED ORDER — PREDNISONE 5 MG PO TABS: 50.0000 mg | ORAL_TABLET | Freq: Every day | ORAL | Status: DC

## 2020-10-17 MED ORDER — HYDROCOD POLST-CPM POLST ER 10-8 MG/5ML PO SUER: 5.0000 mL | Freq: Two times a day (BID) | ORAL | Status: DC | PRN

## 2020-10-17 MED ORDER — ALBUTEROL SULFATE HFA 108 (90 BASE) MCG/ACT IN AERS: 2.0000 | INHALATION_SPRAY | Freq: Four times a day (QID) | RESPIRATORY_TRACT | Status: DC | PRN

## 2020-10-17 MED ORDER — METHYLPREDNISOLONE SODIUM SUCC 40 MG IJ SOLR
0.5000 mg/kg | Freq: Two times a day (BID) | INTRAMUSCULAR | Status: DC
  Administered 2020-10-17 – 2020-10-21 (×9): 38.4 mg via INTRAVENOUS
  Filled 2020-10-17 (×9): qty 1

## 2020-10-17 MED ORDER — PANTOPRAZOLE SODIUM 40 MG PO TBEC
40.0000 mg | DELAYED_RELEASE_TABLET | Freq: Every day | ORAL | Status: DC
  Administered 2020-10-17 – 2020-10-21 (×5): 40 mg via ORAL
  Filled 2020-10-17 (×4): qty 1

## 2020-10-17 MED ORDER — ASPIRIN EC 81 MG PO TBEC
81.0000 mg | DELAYED_RELEASE_TABLET | Freq: Every day | ORAL | Status: DC
  Administered 2020-10-17 – 2020-10-21 (×5): 81 mg via ORAL
  Filled 2020-10-17 (×5): qty 1

## 2020-10-17 MED ORDER — ENOXAPARIN SODIUM 40 MG/0.4ML ~~LOC~~ SOLN
40.0000 mg | SUBCUTANEOUS | Status: DC
  Administered 2020-10-17 – 2020-10-21 (×5): 40 mg via SUBCUTANEOUS
  Filled 2020-10-17 (×5): qty 0.4

## 2020-10-17 NOTE — ED Provider Notes (Signed)
Medical screening examination/treatment/procedure(s) were conducted as a shared visit with non-physician practitioner(s) and myself.  I personally evaluated the patient during the encounter.  79 year old male with multimedical problems presents emerged from today with persistent symptoms.  Patient diagnosed with Covid approximately 9 days ago after being sick for few days.  States he had a period where he was thought he was getting better but now it seems to be getting worse.  T-max at home of 101 range.  Worsening dyspnea.  Has history of COPD but is not on oxygen.  Reportedly got as low as 85% on room air here.  On my exam patient appears well, comfortable slightly tachypneic with rales on the left.  No lower extremity swelling.  Patient states he would not want to be put on a ventilator but is not sure if he would want CPR or not at this time will be considered a full code. X-ray showing some left lower lobe opacities.  Right lobe looks pretty clear.  Wondering if he has a superimposed bacterial pneumonia versus pulmonary emboli as he is getting to the point where he is been sick for 13 to 14 days making worsening Covid a little less likely however he is at high risk with all his medical problems and his age and bad lungs.  Either way patient will need be admitted we will go and add on Covid work-up and a CT angio to better delineate what his lungs look like.  If it does look more diffuse and will just continue with Covid treatment but if it does seem to be asymmetric consolidation consistent with his exam will likely start can be acquired antibiotics obviously if he has a PE will need heparin.  CRITICAL CARE Performed by: Merrily Pew Total critical care time: 35 minutes Critical care time was exclusive of separately billable procedures and treating other patients. Critical care was necessary to treat or prevent imminent or life-threatening deterioration. Critical care was time spent personally by me  on the following activities: development of treatment plan with patient and/or surrogate as well as nursing, discussions with consultants, evaluation of patient's response to treatment, examination of patient, obtaining history from patient or surrogate, ordering and performing treatments and interventions, ordering and review of laboratory studies, ordering and review of radiographic studies, pulse oximetry and re-evaluation of patient's condition.         Dalessandro Baldyga, Corene Cornea, MD 10/17/20 [phone]

## 2020-10-17 NOTE — H&P (Signed)
History and Physical    Bill [name] KVQ:[id number] DOB: February 15, 1941 DOA: 10/16/2020  PCP: [name], MD  Patient coming from: Home.  Chief Complaint: Weakness.  HPI: Bill [name] is a 79 y.o. male with history of diabetes mellitus type 2, COPD, nonobstructive CAD presents to the ER because of ongoing weakness with nausea vomiting and persistent cough for the last 1 week since being diagnosed with Covid infection on October 07, 2020.  Patient is not feeling any short of breath but feeling intensely weak and fatigued.  Denies chest pain fever or chills.  ED Course: In the ER patient blood pressure was in the low normal EKG shows normal sinus rhythm patient is afebrile but hypoxic requiring 2 to 3 L to maintain sats.  CT angiogram of the chest is negative for PE but does show multifocal pneumonia.  Patient also was having low normal blood pressure for which patient was given normal saline bolus.  Labs show mild hypokalemia inflammatory markers are pending.  Covid test was positive.  Patient admitted for Covid infection with respiratory failure and dehydration.  Review of Systems: As per HPI, rest all negative.   Past Medical History:  Diagnosis Date  . Abnormal nuclear stress test 06/02/2019  . Anemia   . Anxiety   . CAD (coronary artery disease) 07/21/2016  . Cardiac arrhythmia   . COPD (chronic obstructive pulmonary disease) ([place])   . COPD (chronic obstructive pulmonary disease) with emphysema ([place]) 12/14/2017  . Frequent headaches 04/14/2018  . Helicobacter pylori gastritis 07/21/2016  . Penicillin allergy 07/21/2016  . Pneumonia   . Right groin pain 06/16/2019  . Skin cancer   . Type 2 diabetes mellitus with hyperglycemia, without long-term current use of insulin ([place]) 03/28/2019    Past Surgical History:  Procedure Laterality Date  . APPENDECTOMY    . back fusion    . cataract surgery Left   . CHOLECYSTECTOMY    . COLONOSCOPY  2014   clear   . LEFT HEART CATH AND  CORONARY ANGIOGRAPHY N/A 06/09/2019   Procedure: LEFT HEART CATH AND CORONARY ANGIOGRAPHY;  Surgeon: Jettie Booze, MD;  Location: [facility];  Service: Cardiovascular;  Laterality: N/A;  . SPINE SURGERY  1984   thoracic spine fusion      reports that he has quit smoking. He has never used smokeless tobacco. He reports that he does not drink alcohol and does not use drugs.  Allergies  Allergen Reactions  . Aspirin   . Oxycodone Nausea Only    PERCOCET- weakness, dizziness, shaking, nausea  . Penicillins Hives    Did it involve swelling of the face/tongue/throat, SOB, or low BP? No Did it involve sudden or severe rash/hives, skin peeling, or any reaction on the inside of your mouth or nose? Yes Did you need to seek medical attention at a hospital or doctor's office? Yes When did it last happen?long time If all above answers are "NO", may proceed with cephalosporin use.   Marland Kitchen Percocet [Oxycodone-Acetaminophen]   . Ciprofloxacin Rash  . Prednisone Palpitations    Family History  Problem Relation Age of Onset  . Colon cancer Mother   . Diabetes Father   . Heart disease Father   . Stroke Father   . Hypertension Father   . Hyperlipidemia Father   . Heart disease Brother   . Pancreatic cancer Cousin        x 2  . Prostate cancer Cousin   . Heart  disease Son     Prior to Admission medications   Medication Sig Start Date End Date Taking? Authorizing Provider  albuterol (VENTOLIN HFA) 108 (90 Base) MCG/ACT inhaler Inhale 2 puffs into the lungs every 6 (six) hours as needed for wheezing or shortness of breath. 10/07/20   Hall-Potvin, [place], PA-C  Ascorbic Acid (VITAMIN C) 100 MG tablet Take 100 mg by mouth daily.    [provider]  aspirin 81 MG tablet Take 81 mg by mouth daily.    [provider]  benzonatate (TESSALON) 100 MG capsule Take 1 capsule (100 mg total) by mouth every 8 (eight) hours. 10/07/20   Hall-Potvin, [place], PA-C   bisoprolol (ZEBETA) 5 MG tablet Take 0.5 tablets (2.5 mg total) by mouth every Monday, Wednesday, and Friday. 08/02/20   [name], MD  Blood Glucose Monitoring Suppl (TRUE METRIX METER) w/Device KIT USE AS DIRECTED 10/07/20   [name], MD  Calcium Citrate-Vitamin D (CALCIUM CITRATE +D PO) Take 1 tablet by mouth daily.     [provider]  carboxymethylcellulose (REFRESH PLUS) 0.5 % SOLN Place 1 drop into both eyes daily as needed (dry eyes).     [provider]  cyanocobalamin 1000 MCG tablet Take 1,000 mcg by mouth daily.     [provider]  fexofenadine (ALLEGRA) 180 MG tablet Take 1 tablet (180 mg total) by mouth daily. 08/01/20   [name], MD  gabapentin (NEURONTIN) 100 MG capsule Take 1 capsule at bedtime for one week, then 2 capsules at bedtime. 09/26/20   [name], DO  metFORMIN (GLUCOPHAGE) 500 MG tablet Take 1 tablet (500 mg total) by mouth daily with breakfast. 08/01/20   [name], MD  nitroGLYCERIN (NITROSTAT) 0.4 MG SL tablet Place 1 tablet (0.4 mg total) under the tongue every 5 (five) minutes as needed for chest pain. 08/01/20   [name], MD  predniSONE (DELTASONE) 20 MG tablet Take 1 tablet (20 mg total) by mouth daily. 10/07/20   Hall-Potvin, [place], PA-C  TRUE METRIX BLOOD GLUCOSE TEST test strip TEST UP TO FOUR TIMES DAILY AS DIRECTED 09/27/20   [name], MD  atorvastatin (LIPITOR) 10 MG tablet Take 1 tablet (10 mg total) by mouth daily. Patient not taking: No sig reported 08/01/20 10/07/20  [name], MD    Physical Exam: Constitutional: Moderately built and nourished. Vitals:   10/17/20 0145 10/17/20 0200 10/17/20 0202 10/17/20 0224  BP:    99/66  Pulse: 87 85  83  Resp: (!) 22 17  16   Temp:   98.8 F (37.1 C)   TempSrc:   Oral   SpO2: 94% 93%  96%   Eyes: Anicteric no pallor. ENMT: No discharge from the ears eyes nose or mouth. Neck: No mass felt.  No neck rigidity. Respiratory: No rhonchi or  crepitations. Cardiovascular: S1-S2 heard. Abdomen: Soft nontender bowel sounds present. Musculoskeletal: No edema. Skin: No rash. Neurologic: Alert awake oriented to time place and person.  Moves all extremities. Psychiatric: Appears normal.  Normal affect.   Labs on Admission: I have personally reviewed following labs and imaging studies  CBC: Recent Labs  Lab 10/16/20 2017  WBC 11.0*  HGB 15.5  HCT 44.5  MCV 90.3  PLT 539   Basic Metabolic Panel: Recent Labs  Lab 10/16/20 2017  NA 133*  K 3.3*  CL 99  CO2 21*  GLUCOSE 204*  BUN 24*  CREATININE 0.93  CALCIUM 8.3*  GFR: CrCl cannot be calculated (Unknown ideal weight.). Liver Function Tests: No results for input(s): AST, ALT, ALKPHOS, BILITOT, PROT, ALBUMIN in the last 168 hours. No results for input(s): LIPASE, AMYLASE in the last 168 hours. No results for input(s): AMMONIA in the last 168 hours. Coagulation Profile: No results for input(s): INR, PROTIME in the last 168 hours. Cardiac Enzymes: No results for input(s): CKTOTAL, CKMB, CKMBINDEX, TROPONINI in the last 168 hours. BNP (last 3 results) No results for input(s): PROBNP in the last 8760 hours. HbA1C: No results for input(s): HGBA1C in the last 72 hours. CBG: No results for input(s): GLUCAP in the last 168 hours. Lipid Profile: No results for input(s): CHOL, HDL, LDLCALC, TRIG, CHOLHDL, LDLDIRECT in the last 72 hours. Thyroid Function Tests: No results for input(s): TSH, T4TOTAL, FREET4, T3FREE, THYROIDAB in the last 72 hours. Anemia Panel: No results for input(s): VITAMINB12, FOLATE, FERRITIN, TIBC, IRON, RETICCTPCT in the last 72 hours. Urine analysis:    Component Value Date/Time   COLORURINE YELLOW 05/09/2020 1445   APPEARANCEUR CLEAR 05/09/2020 1445   LABSPEC 1.015 05/09/2020 1445   PHURINE 6.0 05/09/2020 1445   GLUCOSEU NEGATIVE 05/09/2020 1445   HGBUR NEGATIVE 05/09/2020 1445   BILIRUBINUR NEGATIVE 05/09/2020 1445   BILIRUBINUR n  03/28/2019 1543   KETONESUR NEGATIVE 05/09/2020 1445   PROTEINUR NEGATIVE 05/09/2020 1445   UROBILINOGEN 0.2 03/28/2019 1543   NITRITE NEGATIVE 05/09/2020 1445   LEUKOCYTESUR NEGATIVE 05/09/2020 1445   Sepsis Labs: @LABRCNTIP (procalcitonin:4,lacticidven:4) ) Recent Results (from the past 240 hour(s))  Novel Coronavirus, NAA ([facility])     Status: Abnormal   Collection Time: 10/07/20  6:28 PM   Specimen: Nasopharyngeal(NP) swabs in vial transport medium   Nasopharynge  Result Value Ref Range Status   SARS-CoV-2, NAA Detected (A) Not Detected Final    Comment: Patients who have a positive COVID-19 test result may now have treatment options. Treatment options are available for patients with mild to moderate symptoms and for hospitalized patients. Visit our website at [email or web address] for resources and information. This nucleic acid amplification test was developed and its performance characteristics determined by [employer]. Nucleic acid amplification tests include RT-PCR and TMA. This test has not been FDA cleared or approved. This test has been authorized by FDA under an Emergency Use Authorization (EUA). This test is only authorized for the duration of time the declaration that circumstances exist justifying the authorization of the emergency use of in vitro diagnostic tests for detection of SARS-CoV-2 virus and/or diagnosis of COVID-19 infection under section 564(b)(1) of the Act, 21 U.S.C. 889VQX-4(H) (1), unless the authorization is terminated or revoked sooner. When diagnostic testing is negativ e, the possibility of a false negative result should be considered in the context of a patient's recent exposures and the presence of clinical signs and symptoms consistent with COVID-19. An individual without symptoms of COVID-19 and who is not shedding SARS-CoV-2 virus would expect to have a negative (not detected) result in this assay.   SARS-COV-2, NAA 2  DAY TAT     Status: None   Collection Time: 10/07/20  6:28 PM   Nasopharynge  Result Value Ref Range Status   SARS-CoV-2, NAA 2 DAY TAT Performed  Final     Radiological Exams on Admission: CT Angio Chest PE W and/or Wo Contrast  Result Date: 10/17/2020 CLINICAL DATA:  Shortness of breath, hypoxia EXAM: CT ANGIOGRAPHY CHEST WITH CONTRAST TECHNIQUE: Multidetector CT imaging of the chest was performed using the standard protocol during bolus administration of  intravenous contrast. Multiplanar CT image reconstructions and MIPs were obtained to evaluate the vascular anatomy. CONTRAST:  20m OMNIPAQUE IOHEXOL 350 MG/ML SOLN COMPARISON:  CT 09/17/2009 FINDINGS: Cardiovascular: Satisfactory opacification the pulmonary arteries to the segmental level. No pulmonary artery filling defects are identified. Central pulmonary arteries are top-normal caliber. Borderline cardiomegaly. Three-vessel coronary artery calcification. No pericardial effusion. Suboptimal opacification of the thoracic aorta for luminal assessment. Atherosclerotic plaque within the normal caliber aorta. Normal 3 vessel branching of the aortic arch. Proximal great vessels are mildly calcified but unremarkable. No major venous abnormalities. Mediastinum/Nodes: No mediastinal fluid or gas. Normal thyroid gland and thoracic inlet. No acute abnormality of the trachea. Small hiatal hernia. Fluid in the thoracic esophagus, could reflect some mild reflux. No esophageal thickening or inflammatory change. Scattered subcentimeter mediastinal and hilar nodes are favored to be reactive. No worrisome enlarged mediastinal, hilar or axillary adenopathy by size criteria. Lungs/Pleura: Diffuse heterogeneous geographic areas of ground-glass and consolidative opacity are present in both lungs distributed largely in the lung periphery. There is a background of paraseptal predominant emphysematous changes in both lungs. Diffuse airways thickening could reflect a  combination of acute and chronic airways disease. No pneumothorax. No effusion. Stable 6 mm perifissural nodule along the right major fissure(11/72), 8 mm right middle lobe nodule (11/101), and 3 mm subpleural nodule in the posterior left lower lobe (11/102). 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 Prior cholecystectomy. Geographic focal fatty infiltration along the falciform ligament. Small accessory splenule. Upper abdominal atherosclerotic calcifications. Musculoskeletal: Multilevel degenerative changes are present in the imaged portions of the spine. Mild wedging and focal kyphosis at the thoracolumbar junction, possibly degenerative or remote though could correlate for point tenderness. Some dextrocurvature of the midthoracic spine is noted as well. No other acute or suspicious osseous abnormalities. Degenerative changes of the bilateral shoulders. No worrisome chest wall masses or lesions. Review of the MIP images confirms the above findings. IMPRESSION: 1. No evidence of acute pulmonary artery filling defects to suggest pulmonary embolism. 2. Diffuse heterogeneous geographic areas of ground-glass and consolidative opacity in both lungs distributed largely in the lung periphery, concerning for multifocal pneumonia including potential atypical viral etiologies such as COVID-19. 3. Mild wedging and focal kyphosis at the thoracolumbar junction, possibly degenerative or remote though could correlate for point tenderness to exclude acuity. 4. 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 5. Small hiatal hernia with fluid in the thoracic esophagus, correlate for symptoms of reflux. 6. Few stable small pulmonary nodules, almost certainly benign. 7. Aortic Atherosclerosis (ICD10-I70.0) 8. Emphysema (ICD10-J43.9). Electronically Signed   By: PLovena LeM.D.   On: 10/17/2020 02:36   DG Chest Portable 1 View  Result Date: 10/16/2020 CLINICAL DATA:  COVID-19 diagnosed 10/07/2020, weakness, loss  of appetite EXAM: PORTABLE CHEST 1 VIEW COMPARISON:  06/02/2019 FINDINGS: Single frontal view of the chest was obtained, with the patient rotated toward the left. There is diffuse interstitial prominence, chronic. There is patchy consolidation at the left lung base which could reflect bronchopneumonia. No effusion or pneumothorax. IMPRESSION: 1. Patchy left lower lobe bronchopneumonia. 2. Chronic interstitial scarring. Electronically Signed   By: MRanda NgoM.D.   On: 10/16/2020 20:55    EKG: Independently reviewed.  Normal sinus rhythm right axis deviation.  Assessment/Plan Principal Problem:   Acute respiratory failure due to COVID-19 ([facility] Active Problems:   CAD (coronary artery disease)   COPD (chronic obstructive pulmonary disease) with emphysema ([facility])   Type 2 diabetes mellitus with hyperglycemia, without  long-term current use of insulin ([place])   Pneumonia due to COVID-19 virus   COVID-19 virus infection    1. Acute respiratory failure with hypoxia secondary to Covid pneumonia for which patient is presently on 2 L oxygen to maintain sats more than 90%.  Given hypoxic picture with multifocal pneumonia on CT scan patient agrees to get medication for Covid including remdesivir and steroids which I ordered.  If patient requires further up titration of oxygen may require baricitinib or Actemra for which patient has consented.  Inflammatory markers including CRP D-dimer along with procalcitonin and troponins are pending. 2. Dehydration with mildly elevated lactic acid likely from persistent vomiting and unable to eat well for which patient has received 1 L bolus in the ER.  I have ordered another 400 cc.  Will hold antihypertensives for now follow metabolic panel.  Replace potassium. 3. Diabetes mellitus type 2 with hyperglycemia has not been taking his Metformin for last few days due to persistent vomiting and poor appetite.  We will keep patient on Lantus insulin since patient is going to be  on steroids along with sliding scale coverage.  Follow CBGs closely. 4. Hypertension holding antihypertensives due to low normal blood pressure. 5. COPD not actively wheezing continue home inhalers. 6. Nonobstructive CAD denies any chest pain EKG unremarkable troponins are pending. 7. History of headaches patient was recently started on gabapentin by patient's neurologist patient not sure if he was taking these medications.  Will need to confirm home medication doses.  Since patient has Covid infection with hypoxia and lower ocular pressure with dehydration will need close monitoring for any further worsening in inpatient status.   DVT prophylaxis: Lovenox. Code Status: DNR confirmed with patient. Family Communication: Discussed with patient. Disposition Plan: Home. Consults called: None. Admission status: Inpatient.   Rise Patience MD [facility] Pager [phone].  If 7PM-7AM, please contact night-coverage [email or web address] Password [email or web address]  10/17/2020, 4:55 AM

## 2020-10-17 NOTE — ED Notes (Signed)
Son Otila Kluver would like an update [phone]

## 2020-10-17 NOTE — ED Notes (Signed)
Attempted report x1. 

## 2020-10-17 NOTE — Plan of Care (Signed)
  Problem: Education: Goal: Knowledge of risk factors and measures for prevention of condition will improve Outcome: Progressing   Problem: Coping: Goal: Psychosocial and spiritual needs will be supported Outcome: Progressing   Problem: Respiratory: Goal: Will maintain a patent airway Outcome: Progressing Goal: Complications related to the disease process, condition or treatment will be avoided or minimized Outcome: Progressing   

## 2020-10-17 NOTE — ED Provider Notes (Signed)
[facility] EMERGENCY DEPARTMENT Provider Note   CSN: [id number] Arrival date & time: 10/16/20  1827     History Chief Complaint  Patient presents with  . Weakness    Bill [name] is a 79 y.o. male with a history of COPD, CAD, diabetes type 2.  Patient presents with chief complaint of worsening fatigue, cough, and generalized weakness.  Reports that he tested positive for COVID-19 on December 13.  Since then his symptoms have been waxing and waning.  Patient denies receiving any COVID-19 vaccination.  Patient endorses fevers reports highest temperature in the last 24 hours was 101 F.  Patient reports that his cough is nonproductive, and he also denies any hemoptysis.  Patient denies any shortness of breath.  He denies any use of oxygen at home.  Patient also endorses nasal congestion, intermittent headaches, generalized back pain, and myalgias.      Past Medical History:  Diagnosis Date  . Abnormal nuclear stress test 06/02/2019  . Anemia   . Anxiety   . CAD (coronary artery disease) 07/21/2016  . Cardiac arrhythmia   . COPD (chronic obstructive pulmonary disease) ([place])   . 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 Frequent headaches 04/14/2018  . Helicobacter pylori gastritis 07/21/2016  . Penicillin allergy 07/21/2016  . Pneumonia   . Right groin pain 06/16/2019  . Skin cancer   . Type 2 diabetes mellitus with hyperglycemia, without long-term current use of insulin ([place]) 03/28/2019    Patient Active Problem List   Diagnosis Date Noted  . Pneumonia due to COVID-19 virus 10/17/2020  . Mixed dyslipidemia 01/12/2020  . Elevated LFTs 01/12/2020  . Right groin pain 06/16/2019  . Abnormal nuclear stress test 06/02/2019  . Type 2 diabetes mellitus with hyperglycemia, without long-term current use of insulin ([place]) 03/28/2019  . Frequent headaches 04/14/2018  . COPD (chronic obstructive pulmonary disease) with emphysema ([place])  12/14/2017  . Helicobacter pylori gastritis 07/21/2016  . CAD (coronary artery disease) 07/21/2016    Past Surgical History:  Procedure Laterality Date  . APPENDECTOMY    . back fusion    . cataract surgery Left   . CHOLECYSTECTOMY    . COLONOSCOPY  2014   clear   . LEFT HEART CATH AND CORONARY ANGIOGRAPHY N/A 06/09/2019   Procedure: LEFT HEART CATH AND CORONARY ANGIOGRAPHY;  Surgeon: Jettie Booze, MD;  Location: [facility];  Service: Cardiovascular;  Laterality: N/A;  . SPINE SURGERY  1984   thoracic spine fusion        Family History  Problem Relation Age of Onset  . Colon cancer Mother   . Diabetes Father   . Heart disease Father   . Stroke Father   . Hypertension Father   . Hyperlipidemia Father   . Heart disease Brother   . Pancreatic cancer Cousin        x 2  . Prostate cancer Cousin   . Heart disease Son     Social History   Tobacco Use  . Smoking status: Former Research scientist (life sciences)  . Smokeless tobacco: Never Used  Vaping Use  . Vaping Use: Never used  Substance Use Topics  . Alcohol use: No  . Drug use: No    Home Medications Prior to Admission medications   Medication Sig Start Date End Date Taking? Authorizing Provider  albuterol (VENTOLIN HFA) 108 (90 Base) MCG/ACT inhaler Inhale 2 puffs into the lungs every 6 (six) hours as needed for  wheezing or shortness of breath. 10/07/20   Hall-Potvin, [place], PA-C  Ascorbic Acid (VITAMIN C) 100 MG tablet Take 100 mg by mouth daily.    [provider]  aspirin 81 MG tablet Take 81 mg by mouth daily.    [provider]  benzonatate (TESSALON) 100 MG capsule Take 1 capsule (100 mg total) by mouth every 8 (eight) hours. 10/07/20   Hall-Potvin, [place], PA-C  bisoprolol (ZEBETA) 5 MG tablet Take 0.5 tablets (2.5 mg total) by mouth every Monday, Wednesday, and Friday. 08/02/20   [name], MD  Blood Glucose Monitoring Suppl (TRUE METRIX METER) w/Device KIT USE AS DIRECTED 10/07/20   [name], MD  Calcium Citrate-Vitamin D (CALCIUM CITRATE +D PO) Take 1 tablet by mouth daily.     [provider]  carboxymethylcellulose (REFRESH PLUS) 0.5 % SOLN Place 1 drop into both eyes daily as needed (dry eyes).     [provider]  cyanocobalamin 1000 MCG tablet Take 1,000 mcg by mouth daily.     [provider]  fexofenadine (ALLEGRA) 180 MG tablet Take 1 tablet (180 mg total) by mouth daily. 08/01/20   [name], MD  gabapentin (NEURONTIN) 100 MG capsule Take 1 capsule at bedtime for one week, then 2 capsules at bedtime. 09/26/20   [name], DO  metFORMIN (GLUCOPHAGE) 500 MG tablet Take 1 tablet (500 mg total) by mouth daily with breakfast. 08/01/20   [name], MD  nitroGLYCERIN (NITROSTAT) 0.4 MG SL tablet Place 1 tablet (0.4 mg total) under the tongue every 5 (five) minutes as needed for chest pain. 08/01/20   [name], MD  predniSONE (DELTASONE) 20 MG tablet Take 1 tablet (20 mg total) by mouth daily. 10/07/20   Hall-Potvin, [place], PA-C  TRUE METRIX BLOOD GLUCOSE TEST test strip TEST UP TO FOUR TIMES DAILY AS DIRECTED 09/27/20   [name], MD  atorvastatin (LIPITOR) 10 MG tablet Take 1 tablet (10 mg total) by mouth daily. Patient not taking: No sig reported 08/01/20 10/07/20  [name], MD    Allergies    Aspirin, Oxycodone, Penicillins, Percocet [oxycodone-acetaminophen], Ciprofloxacin, and Prednisone  Review of Systems   Review of Systems  Constitutional: Positive for fatigue. Negative for chills and fever.  HENT: Positive for congestion.   Eyes: Negative for visual disturbance.  Respiratory: Negative for shortness of breath.   Cardiovascular: Negative for chest pain.  Gastrointestinal: Negative for abdominal pain, diarrhea, nausea and vomiting.  Genitourinary: Negative for difficulty urinating and dysuria.  Musculoskeletal: Positive for back pain (Generalized) and myalgias. Negative for neck pain and neck stiffness.   Skin: Negative for color change and rash.  Neurological: Positive for weakness (Generalized) and headaches. Negative for dizziness, syncope and light-headedness.  Psychiatric/Behavioral: Negative for confusion.    Physical Exam Updated Vital Signs BP 99/66   Pulse 83   Temp 98.8 F (37.1 C) (Oral)   Resp 16   SpO2 96%   Physical Exam Vitals and nursing note reviewed.  Constitutional:      General: He is not in acute distress.    Appearance: He is not ill-appearing, toxic-appearing or diaphoretic.     Interventions: Nasal cannula in place.     Comments: On 2LPM O2  HENT:     Head: Normocephalic.  Eyes:     General: No scleral icterus.       Right eye: No discharge.        Left eye: No  discharge.  Cardiovascular:     Rate and Rhythm: Normal rate.     Heart sounds: Normal heart sounds.  Pulmonary:     Effort: Pulmonary effort is normal. No tachypnea or accessory muscle usage.     Breath sounds: Examination of the left-middle field reveals rales. Examination of the left-lower field reveals rales. Rales present. No decreased breath sounds, wheezing or rhonchi.  Abdominal:     General: Abdomen is flat. There is no distension.     Palpations: Abdomen is soft.     Tenderness: There is no abdominal tenderness.  Musculoskeletal:     Cervical back: Neck supple.     Right lower leg: No tenderness. No edema.     Left lower leg: No tenderness. No edema.  Skin:    General: Skin is warm and dry.     Coloration: Skin is not pale.     Findings: No rash.  Neurological:     General: No focal deficit present.     Mental Status: He is alert.  Psychiatric:        Behavior: Behavior is cooperative.     ED Results / Procedures / Treatments   Labs (all labs ordered are listed, but only abnormal results are displayed) Labs Reviewed  BASIC METABOLIC PANEL - Abnormal; Notable for the following components:      Result Value   Sodium 133 (*)    Potassium 3.3 (*)    CO2 21 (*)     Glucose, Bld 204 (*)    BUN 24 (*)    Calcium 8.3 (*)    All other components within normal limits  CBC - Abnormal; Notable for the following components:   WBC 11.0 (*)    RDW 17.1 (*)    All other components within normal limits  CULTURE, BLOOD (ROUTINE X 2)  CULTURE, BLOOD (ROUTINE X 2)  LACTIC ACID, PLASMA  LACTIC ACID, PLASMA  PROCALCITONIN  LACTATE DEHYDROGENASE  FERRITIN  TRIGLYCERIDES  C-REACTIVE PROTEIN  HEPATIC FUNCTION PANEL  D-DIMER, QUANTITATIVE (NOT AT [facility])  FIBRINOGEN    EKG None  Radiology CT Angio Chest PE W and/or Wo Contrast  Result Date: 10/17/2020 CLINICAL DATA:  Shortness of breath, hypoxia EXAM: CT ANGIOGRAPHY CHEST WITH CONTRAST TECHNIQUE: Multidetector CT imaging of the chest was performed using the standard protocol during bolus administration of intravenous contrast. Multiplanar CT image reconstructions and MIPs were obtained to evaluate the vascular anatomy. CONTRAST:  79m OMNIPAQUE IOHEXOL 350 MG/ML SOLN COMPARISON:  CT 09/17/2009 FINDINGS: Cardiovascular: Satisfactory opacification the pulmonary arteries to the segmental level. No pulmonary artery filling defects are identified. Central pulmonary arteries are top-normal caliber. Borderline cardiomegaly. Three-vessel coronary artery calcification. No pericardial effusion. Suboptimal opacification of the thoracic aorta for luminal assessment. Atherosclerotic plaque within the normal caliber aorta. Normal 3 vessel branching of the aortic arch. Proximal great vessels are mildly calcified but unremarkable. No major venous abnormalities. Mediastinum/Nodes: No mediastinal fluid or gas. Normal thyroid gland and thoracic inlet. No acute abnormality of the trachea. Small hiatal hernia. Fluid in the thoracic esophagus, could reflect some mild reflux. No esophageal thickening or inflammatory change. Scattered subcentimeter mediastinal and hilar nodes are favored to be reactive. No worrisome enlarged mediastinal, hilar  or axillary adenopathy by size criteria. Lungs/Pleura: Diffuse heterogeneous geographic areas of ground-glass and consolidative opacity are present in both lungs distributed largely in the lung periphery. There is a background of paraseptal predominant emphysematous changes in both lungs. Diffuse airways thickening could reflect a combination of  acute and chronic airways disease. No pneumothorax. No effusion. Stable 6 mm perifissural nodule along the right major fissure(11/72), 8 mm right middle lobe nodule (11/101), and 3 mm subpleural nodule in the posterior left lower lobe (11/102). 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 Prior cholecystectomy. Geographic focal fatty infiltration along the falciform ligament. Small accessory splenule. Upper abdominal atherosclerotic calcifications. Musculoskeletal: Multilevel degenerative changes are present in the imaged portions of the spine. Mild wedging and focal kyphosis at the thoracolumbar junction, possibly degenerative or remote though could correlate for point tenderness. Some dextrocurvature of the midthoracic spine is noted as well. No other acute or suspicious osseous abnormalities. Degenerative changes of the bilateral shoulders. No worrisome chest wall masses or lesions. Review of the MIP images confirms the above findings. IMPRESSION: 1. No evidence of acute pulmonary artery filling defects to suggest pulmonary embolism. 2. Diffuse heterogeneous geographic areas of ground-glass and consolidative opacity in both lungs distributed largely in the lung periphery, concerning for multifocal pneumonia including potential atypical viral etiologies such as COVID-19. 3. Mild wedging and focal kyphosis at the thoracolumbar junction, possibly degenerative or remote though could correlate for point tenderness to exclude acuity. 4. 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 5. Small hiatal hernia with fluid in the thoracic esophagus, correlate for symptoms  of reflux. 6. Few stable small pulmonary nodules, almost certainly benign. 7. Aortic Atherosclerosis (ICD10-I70.0) 8. Emphysema (ICD10-J43.9). Electronically Signed   By: Lovena Le M.D.   On: 10/17/2020 02:36   DG Chest Portable 1 View  Result Date: 10/16/2020 CLINICAL DATA:  COVID-19 diagnosed 10/07/2020, weakness, loss of appetite EXAM: PORTABLE CHEST 1 VIEW COMPARISON:  06/02/2019 FINDINGS: Single frontal view of the chest was obtained, with the patient rotated toward the left. There is diffuse interstitial prominence, chronic. There is patchy consolidation at the left lung base which could reflect bronchopneumonia. No effusion or pneumothorax. IMPRESSION: 1. Patchy left lower lobe bronchopneumonia. 2. Chronic interstitial scarring. Electronically Signed   By: Randa Ngo M.D.   On: 10/16/2020 20:55    Procedures .Critical Care Performed by: Loni Beckwith, PA-C Authorized by: Loni Beckwith, PA-C   Critical care provider statement:    Critical care time (minutes):  45   Critical care was necessary to treat or prevent imminent or life-threatening deterioration of the following conditions:  Respiratory failure   Critical care was time spent personally by me on the following activities:  Discussions with consultants, evaluation of patient's response to treatment, examination of patient, ordering and performing treatments and interventions, ordering and review of laboratory studies, ordering and review of radiographic studies, pulse oximetry, re-evaluation of patient's condition, obtaining history from patient or surrogate and review of old charts   Care discussed with: admitting provider     (including critical care time)  Medications Ordered in ED Medications  iohexol (OMNIPAQUE) 350 MG/ML injection 75 mL (75 mLs Intravenous Contrast Given 10/17/20 0215)  sodium chloride 0.9 % bolus 1,000 mL (1,000 mLs Intravenous New Bag/Given 10/17/20 0244)    ED Course  I have  reviewed the triage vital signs and the nursing notes.  Pertinent labs & imaging results that were available during my care of the patient were reviewed by me and considered in my medical decision making (see chart for details).    MDM Rules/Calculators/A&P                          Alert 79 year old male in no  acute distress, nontoxic appearing.  Patient presents with worsening fatigue, generalized weakness and nonproductive cough.  Upon interview patient is 2 LPM of O2 via nasal cannula; SPO2 dropped to 85 when talking on room air.  Patient denies any oxygen use at home.  Patient tested positive for COVID-19 on 10/07/20, and is unvaccinated.  Patient had crackles in left lung fields.  Chest x-ray showed patchy left lower lobe bronchopneumonia.  CBC shows leukocytosis of 11.0.  These findings are concerning  for possible CAP.  Also concern for pulmonary embolism due to patient's hypoxia and recent covid 19.  CTA chest with and without contrast ordered and pending.  Covid panel order set placed.    CTA chest showed no evidence of PE; did show diffuse heterogeneous geographic areas of ground glass and consolidative opacities in both lungs distributed largely in the lung periphery.   Finding is consistent with Covid 19 pneumonia.  Will consult hospitalist for admission of due to acute respiratory failure with hypoxia seconary to covid 19 pneumonia.  0300 Spoke with Dr. Hal Hope who agreed to admit the patient.    Patient was discussed with and evaluated by Dr. Dayna Barker.    Bill [name] was evaluated in Emergency Department on 10/17/2020 for the symptoms described in the history of present illness. He was evaluated in the context of the global COVID-19 pandemic, which necessitated consideration that the patient might be at risk for infection with the SARS-CoV-2 virus that causes COVID-19. Institutional protocols and algorithms that pertain to the evaluation of patients at risk for COVID-19 are in  a state of rapid change based on information released by regulatory bodies including the CDC and federal and state organizations. These policies and algorithms were followed during the patient's care in the ED.   Final Clinical Impression(s) / ED Diagnoses Final diagnoses:  COVID-19  Acute respiratory failure with hypoxia [facility])    Rx / DC Orders ED Discharge Orders    None       Dyann Ruddle 10/17/20 0334    Merrily Pew, MD 10/17/20 [phone]

## 2020-10-17 NOTE — ED Notes (Signed)
Lunch Tray Ordered @ 1046. 

## 2020-10-17 NOTE — Progress Notes (Signed)
PROGRESS NOTE                                                                             PROGRESS NOTE                                                                                                                                                                                                             Patient Demographics:    Bill [name], is a 79 y.o. male, DOB - 04-Apr-1941, [id number]  Outpatient Primary MD for the patient is [name], MD    LOS - 0  Admit date - 10/16/2020    Chief Complaint  Patient presents with  . Weakness       Brief Narrative    This is a no charge note as patient was seen and admitted earlier today by Dr. Hal Hope, patient was seen and examined, chart, imaging, and labs were reviewed.  HPI: Bill [name] is a 79 y.o. male with history of diabetes mellitus type 2, COPD, nonobstructive CAD presents to the ER because of ongoing weakness with nausea vomiting and persistent cough for the last 1 week since being diagnosed with Covid infection on October 07, 2020.  Patient is not feeling any short of breath but feeling intensely weak and fatigued.  Denies chest pain fever or chills.  ED Course: In the ER patient blood pressure was in the low normal EKG shows normal sinus rhythm patient is afebrile but hypoxic requiring 2 to 3 L to maintain sats.  CT angiogram of the chest is negative for PE but does show multifocal pneumonia.  Patient also was having low normal blood pressure for which patient was given normal saline bolus.  Labs show mild hypokalemia inflammatory markers are pending.  Covid test was positive.  Patient admitted for Covid infection with respiratory failure and dehydration.   Subjective:    Bill [name] today has, No headache, No chest pain, reports mild dyspnea, and cough.   Assessment  & Plan :    Principal Problem:  Acute respiratory failure due to COVID-19 [facility]) Active  Problems:   CAD (coronary artery disease)   COPD (chronic obstructive pulmonary disease) with emphysema (HCC)   Type 2 diabetes mellitus with hyperglycemia, without long-term current use of insulin ([place])   Pneumonia due to COVID-19 virus   COVID-19 virus infection   1.  Acute respiratory failure with hypoxia secondary to Covid pneumonia for which patient is presently on 2 L oxygen to maintain sats more than 90%.    He is unvaccinated, given hypoxic picture with multifocal pneumonia on CT scan patient agrees to get medication for Covid including remdesivir and steroids which I ordered.  If patient requires further up titration of oxygen may require baricitinib or Actemra for which patient has consented.  Inflammatory markers including CRP D-dimer along with procalcitonin and troponins are pending. 2. Dehydration: continue with IV fluids 3. Diabetes mellitus type 2 with hyperglycemia has not been taking his Metformin for last few days due to persistent vomiting and poor appetite.  We will keep patient on Lantus insulin since patient is going to be on steroids along with sliding scale coverage.  Follow CBGs closely. 4. Hypertension holding antihypertensives due to low normal blood pressure. 5. COPD not actively wheezing continue home inhalers. 6. Nonobstructive CAD denies any chest pain EKG unremarkable troponins are pending. 7. History of headaches patient was recently started on gabapentin by patient's neurologist patient not sure if he was taking these medications.  Will need to confirm home medication doses.   SpO2: 95 % O2 Flow Rate (L/min): 4 L/min  Recent Labs  Lab 10/16/20 2017 10/17/20 0158 10/17/20 0500 10/17/20 0628  WBC 11.0*  --   --  8.8  PLT 208  --   --  179  CRP  --   --  5.5*  --   DDIMER  --   --  0.73*  --   PROCALCITON  --   --  <0.10  --   AST  --   --  38  --   ALT  --   --  36  --   ALKPHOS  --   --  78  --   BILITOT  --   --  1.1  --   ALBUMIN  --   --  2.7*   --   LATICACIDVEN  --  2.2* 1.8  --        ABG     Component Value Date/Time   TCO2 29 03/16/2008 0414          Condition - Extremely Guarded  Family Communication  : D/W fiance Pat  by phone   Code Status :  DNR  Consults  :  none  Disposition Plan  :    Status is: Inpatient  Remains inpatient appropriate because:IV treatments appropriate due to intensity of illness or inability to take PO   Dispo: The patient is from: Home              Anticipated d/c is to: Home              Anticipated d/c date is: 3 days              Patient currently is not medically stable to d/c.      DVT Prophylaxis  :  Lovenox   Lab Results  Component Value Date   PLT 179 10/17/2020    Diet :  Diet Order  Diet heart healthy/carb modified Room service appropriate? Yes; Fluid consistency: Thin  Diet effective now                  Inpatient Medications  Scheduled Meds: . vitamin C  500 mg Oral Daily  . aspirin EC  81 mg Oral Daily  . enoxaparin (LOVENOX) injection  40 mg Subcutaneous Q24H  . insulin aspart  0-9 Units Subcutaneous TID WC  . insulin glargine  10 Units Subcutaneous Daily  . methylPREDNISolone (SOLU-MEDROL) injection  0.5 mg/kg Intravenous Q12H   Followed by  . [START ON 10/20/2020] predniSONE  50 mg Oral Daily  . cyanocobalamin  1,000 mcg Oral Daily  . zinc sulfate  220 mg Oral Daily   Continuous Infusions: . sodium chloride    . [START ON 10/18/2020] remdesivir 100 mg in NS 100 mL     PRN Meds:.acetaminophen **OR** acetaminophen, albuterol, chlorpheniramine-HYDROcodone, guaiFENesin-dextromethorphan  Antibiotics  :    Anti-infectives (From admission, onward)   Start     Dose/Rate Route Frequency Ordered Stop   10/18/20 1000  remdesivir 100 mg in sodium chloride 0.9 % 100 mL IVPB       "Followed by" Linked Group Details   100 mg 200 mL/hr over 30 Minutes Intravenous Daily 10/17/20 0454 10/22/20 0959   10/17/20 0530  remdesivir 200 mg  in sodium chloride 0.9% 250 mL IVPB       "Followed by" Linked Group Details   200 mg 580 mL/hr over 30 Minutes Intravenous Once 10/17/20 0454 10/17/20 [id number]         Emeline Gins Lexis Potenza M.D on 10/17/2020 at 3:59 PM  To page go to [email or web address]  [facility] -  Office  [phone]       Objective:   Vitals:   10/17/20 1300 10/17/20 1315 10/17/20 1330 10/17/20 1345  BP: (!) 142/89 (!) 149/83 127/84 132/77  Pulse: 80 83 81 81  Resp: 19 16 (!) 24 (!) 21  Temp:      TempSrc:      SpO2: 95% 96% 94% 95%  Weight:      Height:        Wt Readings from Last 3 Encounters:  10/17/20 77.1 kg  09/26/20 77.1 kg  07/17/20 76.7 kg     Intake/Output Summary (Last 24 hours) at 10/17/2020 1559 Last data filed at 10/17/2020 0709 Gross per 24 hour  Intake 1750 ml  Output --  Net 1750 ml     Physical Exam  Awake Alert, No new F.N deficits, Normal affect Symmetrical Chest wall movement, Good air movement bilaterally, CTAB RRR,No Gallops,Rubs or new Murmurs, No Parasternal Heave +ve B.Sounds, Abd Soft, No tenderness, No organomegaly appriciated, No rebound - guarding or rigidity. No Cyanosis, Clubbing or edema, No new Rash or bruise     Data Review:    CBC Recent Labs  Lab 10/16/20 2017 10/17/20 0628  WBC 11.0* 8.8  HGB 15.5 14.1  HCT 44.5 41.5  PLT 208 179  MCV 90.3 93.9  MCH 31.4 31.9  MCHC 34.8 34.0  RDW 17.1* 17.1*    Recent Labs  Lab 10/16/20 2017 10/17/20 0158 10/17/20 0500  NA 133*  --   --   K 3.3*  --   --   CL 99  --   --   CO2 21*  --   --   GLUCOSE 204*  --   --   BUN 24*  --   --   CREATININE 0.93  --  0.83  CALCIUM 8.3*  --   --   AST  --   --  38  ALT  --   --  36  ALKPHOS  --   --  78  BILITOT  --   --  1.1  ALBUMIN  --   --  2.7*  CRP  --   --  5.5*  DDIMER  --   --  0.73*  PROCALCITON  --   --  <0.10  LATICACIDVEN  --  2.2* 1.8     ------------------------------------------------------------------------------------------------------------------ Recent Labs    10/17/20 0158  TRIG 63    Lab Results  Component Value Date   HGBA1C 7.4 (H) 04/16/2020   ------------------------------------------------------------------------------------------------------------------ No results for input(s): TSH, T4TOTAL, T3FREE, THYROIDAB in the last 72 hours.  Invalid input(s): FREET3  Cardiac Enzymes No results for input(s): CKMB, TROPONINI, MYOGLOBIN in the last 168 hours.  Invalid input(s): CK ------------------------------------------------------------------------------------------------------------------ No results found for: BNP  Micro Results Recent Results (from the past 240 hour(s))  Novel Coronavirus, NAA ([facility])     Status: Abnormal   Collection Time: 10/07/20  6:28 PM   Specimen: Nasopharyngeal(NP) swabs in vial transport medium   Nasopharynge  Result Value Ref Range Status   SARS-CoV-2, NAA Detected (A) Not Detected Final    Comment: Patients who have a positive COVID-19 test result may now have treatment options. Treatment options are available for patients with mild to moderate symptoms and for hospitalized patients. Visit our website at [email or web address] for resources and information. This nucleic acid amplification test was developed and its performance characteristics determined by [employer]. Nucleic acid amplification tests include RT-PCR and TMA. This test has not been FDA cleared or approved. This test has been authorized by FDA under an Emergency Use Authorization (EUA). This test is only authorized for the duration of time the declaration that circumstances exist justifying the authorization of the emergency use of in vitro diagnostic tests for detection of SARS-CoV-2 virus and/or diagnosis of COVID-19 infection under section 564(b)(1) of the Act, 21  U.S.C. [id number]) (1), unless the authorization is terminated or revoked sooner. When diagnostic testing is negativ e, the possibility of a false negative result should be considered in the context of a patient's recent exposures and the presence of clinical signs and symptoms consistent with COVID-19. An individual without symptoms of COVID-19 and who is not shedding SARS-CoV-2 virus would expect to have a negative (not detected) result in this assay.   SARS-COV-2, NAA 2 DAY TAT     Status: None   Collection Time: 10/07/20  6:28 PM   Nasopharynge  Result Value Ref Range Status   SARS-CoV-2, NAA 2 DAY TAT Performed  Final  Blood Culture (routine x 2)     Status: None (Preliminary result)   Collection Time: 10/17/20  1:58 AM   Specimen: BLOOD  Result Value Ref Range Status   Specimen Description BLOOD SITE NOT SPECIFIED  Final   Special Requests   Final    BOTTLES DRAWN AEROBIC AND ANAEROBIC Blood Culture adequate volume   Culture   Final    NO GROWTH < 12 HOURS Performed at [facility], [place] [address], [place], [place] [address]    Report Status PENDING  Incomplete    Radiology Reports CT Angio Chest PE W and/or Wo Contrast  Result Date: 10/17/2020 CLINICAL DATA:  Shortness of breath, hypoxia EXAM: CT ANGIOGRAPHY CHEST WITH CONTRAST TECHNIQUE: Multidetector CT imaging of the chest was performed using the standard  protocol during bolus administration of intravenous contrast. Multiplanar CT image reconstructions and MIPs were obtained to evaluate the vascular anatomy. CONTRAST:  21mL OMNIPAQUE IOHEXOL 350 MG/ML SOLN COMPARISON:  CT 09/17/2009 FINDINGS: Cardiovascular: Satisfactory opacification the pulmonary arteries to the segmental level. No pulmonary artery filling defects are identified. Central pulmonary arteries are top-normal caliber. Borderline cardiomegaly. Three-vessel coronary artery calcification. No pericardial effusion. Suboptimal opacification of the thoracic  aorta for luminal assessment. Atherosclerotic plaque within the normal caliber aorta. Normal 3 vessel branching of the aortic arch. Proximal great vessels are mildly calcified but unremarkable. No major venous abnormalities. Mediastinum/Nodes: No mediastinal fluid or gas. Normal thyroid gland and thoracic inlet. No acute abnormality of the trachea. Small hiatal hernia. Fluid in the thoracic esophagus, could reflect some mild reflux. No esophageal thickening or inflammatory change. Scattered subcentimeter mediastinal and hilar nodes are favored to be reactive. No worrisome enlarged mediastinal, hilar or axillary adenopathy by size criteria. Lungs/Pleura: Diffuse heterogeneous geographic areas of ground-glass and consolidative opacity are present in both lungs distributed largely in the lung periphery. There is a background of paraseptal predominant emphysematous changes in both lungs. Diffuse airways thickening could reflect a combination of acute and chronic airways disease. No pneumothorax. No effusion. Stable 6 mm perifissural nodule along the right major fissure(11/72), 8 mm right middle lobe nodule (11/101), and 3 mm subpleural nodule in the posterior left lower lobe (11/102). 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 Prior cholecystectomy. Geographic focal fatty infiltration along the falciform ligament. Small accessory splenule. Upper abdominal atherosclerotic calcifications. Musculoskeletal: Multilevel degenerative changes are present in the imaged portions of the spine. Mild wedging and focal kyphosis at the thoracolumbar junction, possibly degenerative or remote though could correlate for point tenderness. Some dextrocurvature of the midthoracic spine is noted as well. No other acute or suspicious osseous abnormalities. Degenerative changes of the bilateral shoulders. No worrisome chest wall masses or lesions. Review of the MIP images confirms the above findings. IMPRESSION: 1. No evidence  of acute pulmonary artery filling defects to suggest pulmonary embolism. 2. Diffuse heterogeneous geographic areas of ground-glass and consolidative opacity in both lungs distributed largely in the lung periphery, concerning for multifocal pneumonia including potential atypical viral etiologies such as COVID-19. 3. Mild wedging and focal kyphosis at the thoracolumbar junction, possibly degenerative or remote though could correlate for point tenderness to exclude acuity. 4. 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 5. Small hiatal hernia with fluid in the thoracic esophagus, correlate for symptoms of reflux. 6. Few stable small pulmonary nodules, almost certainly benign. 7. Aortic Atherosclerosis (ICD10-I70.0) 8. Emphysema (ICD10-J43.9). Electronically Signed   By: Lovena Le M.D.   On: 10/17/2020 02:36   DG Chest Portable 1 View  Result Date: 10/16/2020 CLINICAL DATA:  COVID-19 diagnosed 10/07/2020, weakness, loss of appetite EXAM: PORTABLE CHEST 1 VIEW COMPARISON:  06/02/2019 FINDINGS: Single frontal view of the chest was obtained, with the patient rotated toward the left. There is diffuse interstitial prominence, chronic. There is patchy consolidation at the left lung base which could reflect bronchopneumonia. No effusion or pneumothorax. IMPRESSION: 1. Patchy left lower lobe bronchopneumonia. 2. Chronic interstitial scarring. Electronically Signed   By: Randa Ngo M.D.   On: 10/16/2020 20:55

## 2020-10-18 DIAGNOSIS — I2510 Atherosclerotic heart disease of native coronary artery without angina pectoris: Secondary | ICD-10-CM | POA: Diagnosis not present

## 2020-10-18 DIAGNOSIS — J9600 Acute respiratory failure, unspecified whether with hypoxia or hypercapnia: Secondary | ICD-10-CM | POA: Diagnosis not present

## 2020-10-18 DIAGNOSIS — U071 COVID-19: Secondary | ICD-10-CM | POA: Diagnosis not present

## 2020-10-18 DIAGNOSIS — E1165 Type 2 diabetes mellitus with hyperglycemia: Secondary | ICD-10-CM

## 2020-10-18 LAB — HEMOGLOBIN A1C
Hgb A1c MFr Bld: 7.9 % — ABNORMAL HIGH (ref 4.8–5.6)
Mean Plasma Glucose: 180.03 mg/dL

## 2020-10-18 LAB — COMPREHENSIVE METABOLIC PANEL
ALT: 34 U/L (ref 0–44)
AST: 30 U/L (ref 15–41)
Albumin: 2.6 g/dL — ABNORMAL LOW (ref 3.5–5.0)
Alkaline Phosphatase: 71 U/L (ref 38–126)
Anion gap: 9 (ref 5–15)
BUN: 16 mg/dL (ref 8–23)
CO2: 21 mmol/L — ABNORMAL LOW (ref 22–32)
Calcium: 8.4 mg/dL — ABNORMAL LOW (ref 8.9–10.3)
Chloride: 103 mmol/L (ref 98–111)
Creatinine, Ser: 0.67 mg/dL (ref 0.61–1.24)
GFR, Estimated: >60 mL/min (ref >60)
Glucose, Bld: 281 mg/dL — ABNORMAL HIGH (ref 70–99)
Potassium: 3.6 mmol/L (ref 3.5–5.1)
Sodium: 133 mmol/L — ABNORMAL LOW (ref 135–145)
Total Bilirubin: 0.8 mg/dL (ref 0.3–1.2)
Total Protein: 6.2 g/dL — ABNORMAL LOW (ref 6.5–8.1)

## 2020-10-18 LAB — CBC WITH DIFFERENTIAL/PLATELET
Abs Immature Granulocytes: 0.06 10*3/uL (ref 0.00–0.07)
Basophils Absolute: 0 10*3/uL (ref 0.0–0.1)
Basophils Relative: 0 %
Eosinophils Absolute: 0 10*3/uL (ref 0.0–0.5)
Eosinophils Relative: 0 %
HCT: 39.8 % (ref 39.0–52.0)
Hemoglobin: 14.4 g/dL (ref 13.0–17.0)
Immature Granulocytes: 1 %
Lymphocytes Relative: 6 %
Lymphs Abs: 0.6 10*3/uL — ABNORMAL LOW (ref 0.7–4.0)
MCH: 32.2 pg (ref 26.0–34.0)
MCHC: 36.2 g/dL — ABNORMAL HIGH (ref 30.0–36.0)
MCV: 89 fL (ref 80.0–100.0)
Monocytes Absolute: 0.3 10*3/uL (ref 0.1–1.0)
Monocytes Relative: 3 %
Neutro Abs: 8.5 10*3/uL — ABNORMAL HIGH (ref 1.7–7.7)
Neutrophils Relative %: 90 %
Platelets: 184 10*3/uL (ref 150–400)
RBC: 4.47 MIL/uL (ref 4.22–5.81)
RDW: 16.6 % — ABNORMAL HIGH (ref 11.5–15.5)
WBC: 9.4 10*3/uL (ref 4.0–10.5)
nRBC: 0 % (ref 0.0–0.2)

## 2020-10-18 LAB — GLUCOSE, CAPILLARY
Glucose-Capillary: 230 mg/dL — ABNORMAL HIGH (ref 70–99)
Glucose-Capillary: 222 mg/dL — ABNORMAL HIGH (ref 70–99)
Glucose-Capillary: 232 mg/dL — ABNORMAL HIGH (ref 70–99)
Glucose-Capillary: 166 mg/dL — ABNORMAL HIGH (ref 70–99)

## 2020-10-18 LAB — C-REACTIVE PROTEIN: CRP: 5.4 mg/dL — ABNORMAL HIGH (ref <1.0)

## 2020-10-18 LAB — D-DIMER, QUANTITATIVE: D-Dimer, Quant: 0.52 ug/mL-FEU — ABNORMAL HIGH (ref 0.00–0.50)

## 2020-10-18 MED ORDER — INSULIN ASPART 100 UNIT/ML ~~LOC~~ SOLN
0.0000 [IU] | Freq: Three times a day (TID) | SUBCUTANEOUS | Status: DC
  Administered 2020-10-18 (×2): 5 [IU] via SUBCUTANEOUS
  Administered 2020-10-19: 8 [IU] via SUBCUTANEOUS
  Administered 2020-10-19: 5 [IU] via SUBCUTANEOUS
  Administered 2020-10-19: 15 [IU] via SUBCUTANEOUS
  Administered 2020-10-20: 11 [IU] via SUBCUTANEOUS
  Administered 2020-10-20: 5 [IU] via SUBCUTANEOUS
  Administered 2020-10-20: 8 [IU] via SUBCUTANEOUS
  Administered 2020-10-21: 5 [IU] via SUBCUTANEOUS
  Administered 2020-10-21: 15 [IU] via SUBCUTANEOUS

## 2020-10-18 MED ORDER — INSULIN ASPART 100 UNIT/ML ~~LOC~~ SOLN
5.0000 [IU] | Freq: Once | SUBCUTANEOUS | Status: AC
  Administered 2020-10-18: 5 [IU] via SUBCUTANEOUS

## 2020-10-18 MED ORDER — INSULIN GLARGINE 100 UNIT/ML ~~LOC~~ SOLN
14.0000 [IU] | Freq: Every day | SUBCUTANEOUS | Status: DC
  Administered 2020-10-18 – 2020-10-21 (×4): 14 [IU] via SUBCUTANEOUS
  Filled 2020-10-18 (×4): qty 0.14

## 2020-10-18 MED ORDER — POTASSIUM CHLORIDE CRYS ER 20 MEQ PO TBCR
40.0000 meq | EXTENDED_RELEASE_TABLET | Freq: Once | ORAL | Status: AC
  Administered 2020-10-18: 40 meq via ORAL
  Filled 2020-10-18: qty 2

## 2020-10-18 MED ORDER — INSULIN ASPART 100 UNIT/ML ~~LOC~~ SOLN
0.0000 [IU] | Freq: Every day | SUBCUTANEOUS | Status: DC
  Administered 2020-10-19: 3 [IU] via SUBCUTANEOUS
  Administered 2020-10-20: 2 [IU] via SUBCUTANEOUS

## 2020-10-18 MED ORDER — LINAGLIPTIN 5 MG PO TABS
5.0000 mg | ORAL_TABLET | Freq: Every day | ORAL | Status: DC
  Administered 2020-10-18 – 2020-10-21 (×4): 5 mg via ORAL
  Filled 2020-10-18 (×4): qty 1

## 2020-10-18 NOTE — Plan of Care (Signed)
  Problem: Education: Goal: Knowledge of risk factors and measures for prevention of condition will improve Outcome: Progressing   Problem: Coping: Goal: Psychosocial and spiritual needs will be supported Outcome: Progressing   Problem: Respiratory: Goal: Will maintain a patent airway Outcome: Progressing Goal: Complications related to the disease process, condition or treatment will be avoided or minimized Outcome: Progressing   Problem: Education: Goal: Knowledge of General Education information will improve Description: Including pain rating scale, medication(s)/side effects and non-pharmacologic comfort measures Outcome: Progressing   Problem: Health Behavior/Discharge Planning: Goal: Ability to manage health-related needs will improve Outcome: Progressing   Problem: Clinical Measurements: Goal: Respiratory complications will improve Outcome: Progressing   Problem: Activity: Goal: Risk for activity intolerance will decrease Outcome: Progressing   

## 2020-10-18 NOTE — Progress Notes (Signed)
PROGRESS NOTE                                                                             PROGRESS NOTE                                                                                                                                                                                                             Patient Demographics:    Bill [name], is a 79 y.o. male, DOB - [birth year], [id number]  Outpatient Primary MD for the patient is [name], MD    LOS - 1  Admit date - 10/16/2020    Chief Complaint  Patient presents with  . Weakness       Brief Narrative    HPI: Bill [name] is a 79 y.o. male with history of diabetes mellitus type 2, COPD, nonobstructive CAD presents to the ER because of ongoing weakness with nausea vomiting and persistent cough for the last 1 week since being diagnosed with Covid infection on October 07, 2020.  Patient is not feeling any short of breath but feeling intensely weak and fatigued.  Denies chest pain fever or chills.  ED Course: In the ER patient blood pressure was in the low normal EKG shows normal sinus rhythm patient is afebrile but hypoxic requiring 2 to 3 L to maintain sats.  CT angiogram of the chest is negative for PE but does show multifocal pneumonia.  Patient also was having low normal blood pressure for which patient was given normal saline bolus.  Labs show mild hypokalemia inflammatory markers are pending.  Covid test was positive.  Patient admitted for Covid infection with respiratory failure and dehydration.   Subjective:    Bill [name] today ports generalized weakness, cough, some dyspnea and poor appetite    Assessment  & Plan :    Principal Problem:   Acute respiratory failure due to COVID-19 [facility]) Active Problems:   CAD (coronary artery disease)   COPD (chronic obstructive pulmonary disease) with emphysema (HCC)   Type  2 diabetes mellitus with hyperglycemia, without  long-term current use of insulin (HCC)   Pneumonia due to COVID-19 virus   COVID-19 virus infection  Acute respiratory failure with hypoxia secondary to Covid pneumonia  -Patient is unvaccinated  -He is on 2 to 3 L nasal cannula this morning . -Continue with IV steroids . -Continue with IV remdesivir . -Was encouraged to use incentive spirometry, flutter valve, and to get out of bed to chair, will consult PT/OT . -  If patient requires further up titration of oxygen may require baricitinib or Actemra for which patient has consented.   -Continue to trend inflammatory markers   Dehydration:  -Resolved with IV fluids  Diabetes mellitus type 2 with hyperglycemia -  has not been taking his Metformin for last few days due to persistent vomiting and poor appetite. -Tolerating oral intake, started to increase, will increase Lantus from 10 to 14 units, will add Tradjenta, and change sliding scale to moderate -We will check A1c  Hypertension -Blood pressure remains soft, continue to hold bisoprolol  COPD  -not actively wheezing continue home inhalers.  Nonobstructive CAD  -denies any chest pain EKG unremarkable troponins are pending.   SpO2: 93 % O2 Flow Rate (L/min): 4 L/min  Recent Labs  Lab 10/16/20 2017 10/17/20 0158 10/17/20 0500 10/17/20 0628 10/17/20 2058 10/18/20 0116  WBC 11.0*  --   --  8.8  --  9.4  PLT 208  --   --  179  --  184  CRP  --   --  5.5*  --   --  5.4*  DDIMER  --   --  0.73*  --   --  0.52*  PROCALCITON  --   --  <0.10  --   --   --   AST  --   --  38  --   --  30  ALT  --   --  36  --   --  34  ALKPHOS  --   --  78  --   --  71  BILITOT  --   --  1.1  --   --  0.8  ALBUMIN  --   --  2.7*  --   --  2.6*  LATICACIDVEN  --  2.2* 1.8  --  1.8  --        ABG     Component Value Date/Time   TCO2 29 03/16/2008 0414          Condition - Extremely Guarded  Family Communication  : D/W fiance Pat  by phone 12/23  Code Status :   DNR  Consults  :  none  Disposition Plan  :    Status is: Inpatient  Remains inpatient appropriate because:IV treatments appropriate due to intensity of illness or inability to take PO   Dispo: The patient is from: Home              Anticipated d/c is to: Home              Anticipated d/c date is: 3 days              Patient currently is not medically stable to d/c.      DVT Prophylaxis  :  Lovenox   Lab Results  Component Value Date   PLT 184 10/18/2020    Diet :  Diet Order            Diet heart healthy/carb  modified Room service appropriate? Yes; Fluid consistency: Thin  Diet effective now                  Inpatient Medications  Scheduled Meds: . vitamin C  500 mg Oral Daily  . aspirin EC  81 mg Oral Daily  . enoxaparin (LOVENOX) injection  40 mg Subcutaneous Q24H  . insulin aspart  0-15 Units Subcutaneous TID WC  . insulin aspart  0-5 Units Subcutaneous QHS  . insulin glargine  14 Units Subcutaneous Daily  . linagliptin  5 mg Oral Daily  . methylPREDNISolone (SOLU-MEDROL) injection  0.5 mg/kg Intravenous Q12H   Followed by  . [START ON 10/20/2020] predniSONE  50 mg Oral Daily  . pantoprazole  40 mg Oral Daily  . cyanocobalamin  1,000 mcg Oral Daily  . zinc sulfate  220 mg Oral Daily   Continuous Infusions: . remdesivir 100 mg in NS 100 mL Stopped (10/18/20 0912)   PRN Meds:.acetaminophen **OR** acetaminophen, albuterol, chlorpheniramine-HYDROcodone, guaiFENesin-dextromethorphan  Antibiotics  :    Anti-infectives (From admission, onward)   Start     Dose/Rate Route Frequency Ordered Stop   10/18/20 1000  remdesivir 100 mg in sodium chloride 0.9 % 100 mL IVPB       "Followed by" Linked Group Details   100 mg 200 mL/hr over 30 Minutes Intravenous Daily 10/17/20 0454 10/22/20 0959   10/17/20 0530  remdesivir 200 mg in sodium chloride 0.9% 250 mL IVPB       "Followed by" Linked Group Details   200 mg 580 mL/hr over 30 Minutes Intravenous Once  10/17/20 0454 10/17/20 6433         Emeline Gins Carlen Fils M.D on 10/18/2020 at 11:20 AM  To page go to [email or web address]  [facility] -  Office  [phone]       Objective:   Vitals:   10/17/20 2344 10/18/20 0409 10/18/20 0447 10/18/20 0727  BP: 127/75 121/61  129/75  Pulse: 71 65  71  Resp: 16 16  19   Temp: 98 F (36.7 C) 98.3 F (36.8 C)  98 F (36.7 C)  TempSrc: Axillary Axillary  Oral  SpO2: 93% 93%  93%  Weight:   77 kg   Height:        Wt Readings from Last 3 Encounters:  10/18/20 77 kg  09/26/20 77.1 kg  07/17/20 76.7 kg     Intake/Output Summary (Last 24 hours) at 10/18/2020 1120 Last data filed at 10/18/2020 0912 Gross per 24 hour  Intake 337.26 ml  Output --  Net 337.26 ml     Physical Exam  Awake Alert, Oriented X 3, No new F.N deficits, Normal affect Symmetrical Chest wall movement, Good air movement bilaterally, CTAB RRR,No Gallops,Rubs or new Murmurs, No Parasternal Heave +ve B.Sounds, Abd Soft, No tenderness, No rebound - guarding or rigidity. No Cyanosis, Clubbing or edema, No new Rash or bruise      Data Review:    CBC Recent Labs  Lab 10/16/20 2017 10/17/20 0628 10/18/20 0116  WBC 11.0* 8.8 9.4  HGB 15.5 14.1 14.4  HCT 44.5 41.5 39.8  PLT 208 179 184  MCV 90.3 93.9 89.0  MCH 31.4 31.9 32.2  MCHC 34.8 34.0 36.2*  RDW 17.1* 17.1* 16.6*  LYMPHSABS  --   --  0.6*  MONOABS  --   --  0.3  EOSABS  --   --  0.0  BASOSABS  --   --  0.0    Recent Labs  Lab 10/16/20 2017 10/17/20 0158 10/17/20 0500 10/17/20 2058 10/18/20 0116 10/18/20 0735  NA 133*  --   --   --  133*  --   K 3.3*  --   --   --  3.6  --   CL 99  --   --   --  103  --   CO2 21*  --   --   --  21*  --   GLUCOSE 204*  --   --   --  281*  --   BUN 24*  --   --   --  16  --   CREATININE 0.93  --  0.83  --  0.67  --   CALCIUM 8.3*  --   --   --  8.4*  --   AST  --   --  38  --  30  --   ALT  --   --  36  --  34  --   ALKPHOS  --   --  78  --  71  --    BILITOT  --   --  1.1  --  0.8  --   ALBUMIN  --   --  2.7*  --  2.6*  --   CRP  --   --  5.5*  --  5.4*  --   DDIMER  --   --  0.73*  --  0.52*  --   PROCALCITON  --   --  <0.10  --   --   --   LATICACIDVEN  --  2.2* 1.8 1.8  --   --   HGBA1C  --   --   --   --   --  7.9*    ------------------------------------------------------------------------------------------------------------------ Recent Labs    10/17/20 0158  TRIG 63    Lab Results  Component Value Date   HGBA1C 7.9 (H) 10/18/2020   ------------------------------------------------------------------------------------------------------------------ No results for input(s): TSH, T4TOTAL, T3FREE, THYROIDAB in the last 72 hours.  Invalid input(s): FREET3  Cardiac Enzymes No results for input(s): CKMB, TROPONINI, MYOGLOBIN in the last 168 hours.  Invalid input(s): CK ------------------------------------------------------------------------------------------------------------------ No results found for: BNP  Micro Results Recent Results (from the past 240 hour(s))  Blood Culture (routine x 2)     Status: None (Preliminary result)   Collection Time: 10/17/20  1:58 AM   Specimen: BLOOD  Result Value Ref Range Status   Specimen Description BLOOD SITE NOT SPECIFIED  Final   Special Requests   Final    BOTTLES DRAWN AEROBIC AND ANAEROBIC Blood Culture adequate volume   Culture   Final    NO GROWTH < 12 HOURS Performed at [facility], [address] [place], [place] [address]    Report Status PENDING  Incomplete    Radiology Reports CT Angio Chest PE W and/or Wo Contrast  Result Date: 10/17/2020 CLINICAL DATA:  Shortness of breath, hypoxia EXAM: CT ANGIOGRAPHY CHEST WITH CONTRAST TECHNIQUE: Multidetector CT imaging of the chest was performed using the standard protocol during bolus administration of intravenous contrast. Multiplanar CT image reconstructions and MIPs were obtained to evaluate the vascular  anatomy. CONTRAST:  26mL OMNIPAQUE IOHEXOL 350 MG/ML SOLN COMPARISON:  CT 09/17/2009 FINDINGS: Cardiovascular: Satisfactory opacification the pulmonary arteries to the segmental level. No pulmonary artery filling defects are identified. Central pulmonary arteries are top-normal caliber. Borderline cardiomegaly. Three-vessel coronary artery calcification. No pericardial effusion. Suboptimal opacification of the thoracic aorta for luminal assessment. Atherosclerotic  plaque within the normal caliber aorta. Normal 3 vessel branching of the aortic arch. Proximal great vessels are mildly calcified but unremarkable. No major venous abnormalities. Mediastinum/Nodes: No mediastinal fluid or gas. Normal thyroid gland and thoracic inlet. No acute abnormality of the trachea. Small hiatal hernia. Fluid in the thoracic esophagus, could reflect some mild reflux. No esophageal thickening or inflammatory change. Scattered subcentimeter mediastinal and hilar nodes are favored to be reactive. No worrisome enlarged mediastinal, hilar or axillary adenopathy by size criteria. Lungs/Pleura: Diffuse heterogeneous geographic areas of ground-glass and consolidative opacity are present in both lungs distributed largely in the lung periphery. There is a background of paraseptal predominant emphysematous changes in both lungs. Diffuse airways thickening could reflect a combination of acute and chronic airways disease. No pneumothorax. No effusion. Stable 6 mm perifissural nodule along the right major fissure(11/72), 8 mm right middle lobe nodule (11/101), and 3 mm subpleural nodule in the posterior left lower lobe (11/102). 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 Prior cholecystectomy. Geographic focal fatty infiltration along the falciform ligament. Small accessory splenule. Upper abdominal atherosclerotic calcifications. Musculoskeletal: Multilevel degenerative changes are present in the imaged portions of the spine. Mild  wedging and focal kyphosis at the thoracolumbar junction, possibly degenerative or remote though could correlate for point tenderness. Some dextrocurvature of the midthoracic spine is noted as well. No other acute or suspicious osseous abnormalities. Degenerative changes of the bilateral shoulders. No worrisome chest wall masses or lesions. Review of the MIP images confirms the above findings. IMPRESSION: 1. No evidence of acute pulmonary artery filling defects to suggest pulmonary embolism. 2. Diffuse heterogeneous geographic areas of ground-glass and consolidative opacity in both lungs distributed largely in the lung periphery, concerning for multifocal pneumonia including potential atypical viral etiologies such as COVID-19. 3. Mild wedging and focal kyphosis at the thoracolumbar junction, possibly degenerative or remote though could correlate for point tenderness to exclude acuity. 4. 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 5. Small hiatal hernia with fluid in the thoracic esophagus, correlate for symptoms of reflux. 6. Few stable small pulmonary nodules, almost certainly benign. 7. Aortic Atherosclerosis (ICD10-I70.0) 8. Emphysema (ICD10-J43.9). Electronically Signed   By: Lovena Le M.D.   On: 10/17/2020 02:36   DG Chest Portable 1 View  Result Date: 10/16/2020 CLINICAL DATA:  COVID-19 diagnosed 10/07/2020, weakness, loss of appetite EXAM: PORTABLE CHEST 1 VIEW COMPARISON:  06/02/2019 FINDINGS: Single frontal view of the chest was obtained, with the patient rotated toward the left. There is diffuse interstitial prominence, chronic. There is patchy consolidation at the left lung base which could reflect bronchopneumonia. No effusion or pneumothorax. IMPRESSION: 1. Patchy left lower lobe bronchopneumonia. 2. Chronic interstitial scarring. Electronically Signed   By: Randa Ngo M.D.   On: 10/16/2020 20:55

## 2020-10-18 NOTE — Evaluation (Signed)
Physical Therapy Evaluation Patient Details Name: Bill [name] MRN: [id number] DOB: [birth year] Today's Date: 10/18/2020   History of Present Illness  79 yo male admitted 12/22 with respiratory failure and dehydration due to Covid 19 diagnosed 12/13. PMHx: CAD, COPD, DM  Clinical Impression  Pt very pleasant and agreeable to mobility. Pt sitting in recliner after lunch with SpO2 89% on 4L. O2 bumped to 6L for gait with SpO2 maintained 85-88% with slow gait. Pt with decreased activity tolerance, cardiopulmonary function and balance who will benefit from acute therapy to maximize mobility, safety and independence to decrease burden of care.   89% on 4L at rest, HR 79 85-88% on 6L with gait, HR 110    Follow Up Recommendations No PT follow up    Equipment Recommendations  None recommended by PT    Recommendations for Other Services       Precautions / Restrictions Precautions Precautions: Fall Precaution Comments: watch sats      Mobility  Bed Mobility               General bed mobility comments: sitting in recliner on arrival and end of session    Transfers Overall transfer level: Modified independent                  Ambulation/Gait Ambulation/Gait assistance: Supervision Gait Distance (Feet): 450 Feet Assistive device: Rolling walker (2 wheeled) Gait Pattern/deviations: Step-through pattern;Decreased stride length   Gait velocity interpretation: <1.8 ft/sec, indicate of risk for recurrent falls General Gait Details: slow steady gait with cues for breathing technique. SpO2 85-89%on 6L with gait  Stairs            Wheelchair Mobility    Modified Rankin (Stroke Patients Only)       Balance Overall balance assessment: Mild deficits observed, not formally tested                                           Pertinent Vitals/Pain Pain Assessment: No/denies pain    Home Living Family/patient expects to be discharged to::  Private residence Living Arrangements: Spouse/significant other Available Help at Discharge: Family;Available 24 hours/day Type of Home: House Home Access: Stairs to enter   [employer] of Steps: 5 Home Layout: Two level;Able to live on main level with bedroom/bathroom Home Equipment: Gilford Rile - 2 wheels;Cane - single point      Prior Function Level of Independence: Independent               Hand Dominance        Extremity/Trunk Assessment   Upper Extremity Assessment Upper Extremity Assessment: Overall WFL for tasks assessed    Lower Extremity Assessment Lower Extremity Assessment: Overall WFL for tasks assessed    Cervical / Trunk Assessment Cervical / Trunk Assessment: Other exceptions (rounded shoulders)  Communication   Communication: No difficulties  Cognition Arousal/Alertness: Awake/alert Behavior During Therapy: WFL for tasks assessed/performed Overall Cognitive Status: Within Functional Limits for tasks assessed                                        General Comments      Exercises     Assessment/Plan    PT Assessment Patient needs continued PT services  PT Problem List Decreased mobility;Decreased activity tolerance;Cardiopulmonary status limiting  activity;Decreased balance;Decreased knowledge of use of DME       PT Treatment Interventions Gait training;Therapeutic activities;Patient/family education;Functional mobility training;Stair training;Therapeutic exercise;DME instruction;Balance training    PT Goals (Current goals can be found in the Care Plan section)  Acute Rehab PT Goals 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 PT Goal Formulation: With patient Time For Goal Achievement: 11/01/20 Potential to Achieve Goals: Fair    Frequency Min 3X/week   Barriers to discharge        Co-evaluation               AM-PAC PT "6 Clicks" Mobility  Outcome Measure Help needed turning from your back to your side  while in a flat bed without using bedrails?: None Help needed moving from lying on your back to sitting on the side of a flat bed without using bedrails?: None Help needed moving to and from a bed to a chair (including a wheelchair)?: None Help needed standing up from a chair using your arms (e.g., wheelchair or bedside chair)?: A Little Help needed to walk in hospital room?: A Little Help needed climbing 3-5 steps with a railing? : A Little 6 Click Score: 21    End of Session Equipment Utilized During Treatment: Oxygen Activity Tolerance: Patient tolerated treatment well Patient left: in chair;with call bell/phone within reach Nurse Communication: Mobility status PT Visit Diagnosis: Other abnormalities of gait and mobility (R26.89);Difficulty in walking, not elsewhere classified (R26.2)    Time: 0454-0981 PT Time Calculation (min) (ACUTE ONLY): 32 min   Charges:   PT Evaluation $PT Eval Moderate Complexity: 1 Mod PT Treatments $Gait Training: 8-22 mins        Ayven Glasco P, PT Acute Rehabilitation Services Pager: [phone] Office: [place] 10/18/2020, 1:59 PM

## 2020-10-18 NOTE — TOC Initial Note (Signed)
Transition of Care [facility]) - Initial/Assessment Note    Patient Details  Name: Bill [name] MRN: [id number] Date of Birth: August 27, 1941  [facility] CM/SW Contact:    Verdell Carmine, RN Phone Number: 10/18/2020, 10:08 AM  Clinical Narrative:                 17 Male history of COPD comes in from positive COVID test on 12/13 unvaccinated. Increase in fatigue and weakness hypoxia needing 4LPM of oxygen.  Will be evaluated by PT OT likely will need oxygen at home and Home health. Has son and friend listed as NOK.  Lives in [place]. Insured with [employer] will follow for needs.   Expected Discharge Plan: [place] Barriers to Discharge: Continued Medical Work up   Patient Goals and CMS Choice        Expected Discharge Plan and Services Expected Discharge Plan: [place]   Discharge Planning Services: CM Consult   Living arrangements for the past 2 months: Single Family Home                                      Prior Living Arrangements/Services Living arrangements for the past 2 months: Single Family Home Lives with:: Self Patient language and need for interpreter reviewed:: Yes        Need for Family Participation in Patient Care: Yes (Comment) Care giver support system in place?: Yes (comment)   Criminal Activity/Legal Involvement Pertinent to Current Situation/Hospitalization: No - Comment as needed  Activities of Daily Living Home Assistive Devices/Equipment: None ADL Screening (condition at time of admission) Patient's cognitive ability adequate to safely complete daily activities?: Yes Is the patient deaf or have difficulty hearing?: No Does the patient have difficulty seeing, even when wearing glasses/contacts?: No Does the patient have difficulty concentrating, remembering, or making decisions?: No Patient able to express need for assistance with ADLs?: Yes Does the patient have difficulty dressing or  bathing?: No Independently performs ADLs?: Yes (appropriate for developmental age) Does the patient have difficulty walking or climbing stairs?: No Weakness of Legs: Both Weakness of Arms/Hands: None  Permission Sought/Granted                  Emotional Assessment       Orientation: : Oriented to Self,Oriented to  Time,Oriented to Place,Oriented to Situation Alcohol / Substance Use: Not Applicable Psych Involvement: No (comment)  Admission diagnosis:  Acute respiratory failure with hypoxia ([place]) [J96.01] COVID-19 virus infection [U07.1] Pneumonia due to COVID-19 virus [U07.1, J12.82] COVID-19 [U07.1] Patient Active Problem List   Diagnosis Date Noted  . Pneumonia due to COVID-19 virus 10/17/2020  . Acute respiratory failure due to COVID-19 ([place]) 10/17/2020  . COVID-19 virus infection 10/17/2020  . Mixed dyslipidemia 01/12/2020  . Elevated LFTs 01/12/2020  . Right groin pain 06/16/2019  . Abnormal nuclear stress test 06/02/2019  . Type 2 diabetes mellitus with hyperglycemia, without long-term current use of insulin ([place]) 03/28/2019  . Frequent headaches 04/14/2018  . COPD (chronic obstructive pulmonary disease) with emphysema ([place]) 12/14/2017  . Helicobacter pylori gastritis 07/21/2016  . CAD (coronary artery disease) 07/21/2016   PCP:  [name], MD Pharmacy:   [facility] [address] [place]), [place] - [id number] [place] [address] Phone: [phone] Fax: [phone]  [place],  [place] - [address] [place] [address] Phone: [phone] Fax: [facility] - [place], [place] [address] Phone: [phone] Fax: [phone]     Social Determinants of Health (SDOH) Interventions    Readmission Risk Interventions No flowsheet data found.

## 2020-10-19 DIAGNOSIS — I2510 Atherosclerotic heart disease of native coronary artery without angina pectoris: Secondary | ICD-10-CM | POA: Diagnosis not present

## 2020-10-19 DIAGNOSIS — U071 COVID-19: Secondary | ICD-10-CM | POA: Diagnosis not present

## 2020-10-19 DIAGNOSIS — J1282 Pneumonia due to coronavirus disease 2019: Secondary | ICD-10-CM | POA: Diagnosis not present

## 2020-10-19 DIAGNOSIS — J9600 Acute respiratory failure, unspecified whether with hypoxia or hypercapnia: Secondary | ICD-10-CM | POA: Diagnosis not present

## 2020-10-19 LAB — COMPREHENSIVE METABOLIC PANEL
ALT: 30 U/L (ref 0–44)
AST: 24 U/L (ref 15–41)
Albumin: 2.6 g/dL — ABNORMAL LOW (ref 3.5–5.0)
Alkaline Phosphatase: 70 U/L (ref 38–126)
Anion gap: 8 (ref 5–15)
BUN: 16 mg/dL (ref 8–23)
CO2: 24 mmol/L (ref 22–32)
Calcium: 8.6 mg/dL — ABNORMAL LOW (ref 8.9–10.3)
Chloride: 104 mmol/L (ref 98–111)
Creatinine, Ser: 0.68 mg/dL (ref 0.61–1.24)
GFR, Estimated: >60 mL/min (ref >60)
Glucose, Bld: 223 mg/dL — ABNORMAL HIGH (ref 70–99)
Potassium: 4.1 mmol/L (ref 3.5–5.1)
Sodium: 136 mmol/L (ref 135–145)
Total Bilirubin: 0.6 mg/dL (ref 0.3–1.2)
Total Protein: 5.9 g/dL — ABNORMAL LOW (ref 6.5–8.1)

## 2020-10-19 LAB — CBC WITH DIFFERENTIAL/PLATELET
Abs Immature Granulocytes: 0.12 10*3/uL — ABNORMAL HIGH (ref 0.00–0.07)
Basophils Absolute: 0 10*3/uL (ref 0.0–0.1)
Basophils Relative: 0 %
Eosinophils Absolute: 0 10*3/uL (ref 0.0–0.5)
Eosinophils Relative: 0 %
HCT: 40.9 % (ref 39.0–52.0)
Hemoglobin: 14 g/dL (ref 13.0–17.0)
Immature Granulocytes: 1 %
Lymphocytes Relative: 4 %
Lymphs Abs: 0.5 10*3/uL — ABNORMAL LOW (ref 0.7–4.0)
MCH: 30.9 pg (ref 26.0–34.0)
MCHC: 34.2 g/dL (ref 30.0–36.0)
MCV: 90.3 fL (ref 80.0–100.0)
Monocytes Absolute: 0.4 10*3/uL (ref 0.1–1.0)
Monocytes Relative: 3 %
Neutro Abs: 12.6 10*3/uL — ABNORMAL HIGH (ref 1.7–7.7)
Neutrophils Relative %: 92 %
Platelets: 249 10*3/uL (ref 150–400)
RBC: 4.53 MIL/uL (ref 4.22–5.81)
RDW: 16.5 % — ABNORMAL HIGH (ref 11.5–15.5)
WBC: 13.7 10*3/uL — ABNORMAL HIGH (ref 4.0–10.5)
nRBC: 0 % (ref 0.0–0.2)

## 2020-10-19 LAB — GLUCOSE, CAPILLARY
Glucose-Capillary: 205 mg/dL — ABNORMAL HIGH (ref 70–99)
Glucose-Capillary: 286 mg/dL — ABNORMAL HIGH (ref 70–99)
Glucose-Capillary: 367 mg/dL — ABNORMAL HIGH (ref 70–99)
Glucose-Capillary: 272 mg/dL — ABNORMAL HIGH (ref 70–99)

## 2020-10-19 LAB — D-DIMER, QUANTITATIVE: D-Dimer, Quant: 0.55 ug/mL-FEU — ABNORMAL HIGH (ref 0.00–0.50)

## 2020-10-19 LAB — C-REACTIVE PROTEIN: CRP: 1.3 mg/dL — ABNORMAL HIGH (ref <1.0)

## 2020-10-19 MED ORDER — ONDANSETRON HCL 4 MG/2ML IJ SOLN
4.0000 mg | Freq: Four times a day (QID) | INTRAMUSCULAR | Status: DC | PRN
  Administered 2020-10-19: 4 mg via INTRAVENOUS
  Filled 2020-10-19: qty 2

## 2020-10-19 MED ORDER — BARICITINIB 2 MG PO TABS
4.0000 mg | ORAL_TABLET | Freq: Every day | ORAL | Status: DC
  Administered 2020-10-19 – 2020-10-21 (×3): 4 mg via ORAL
  Filled 2020-10-19 (×3): qty 2

## 2020-10-19 NOTE — Plan of Care (Signed)
  Problem: Education: Goal: Knowledge of risk factors and measures for prevention of condition will improve Outcome: Progressing   Problem: Coping: Goal: Psychosocial and spiritual needs will be supported Outcome: Progressing   Problem: Respiratory: Goal: Will maintain a patent airway Outcome: Progressing Goal: Complications related to the disease process, condition or treatment will be avoided or minimized Outcome: Progressing   Problem: Education: Goal: Knowledge of General Education information will improve Description: Including pain rating scale, medication(s)/side effects and non-pharmacologic comfort measures Outcome: Progressing   Problem: Health Behavior/Discharge Planning: Goal: Ability to manage health-related needs will improve Outcome: Progressing   Problem: Clinical Measurements: Goal: Respiratory complications will improve Outcome: Progressing   Problem: Activity: Goal: Risk for activity intolerance will decrease Outcome: Progressing   

## 2020-10-19 NOTE — Progress Notes (Signed)
PROGRESS NOTE                                                                             PROGRESS NOTE                                                                                                                                                                                                             Patient Demographics:    Bill [name], is a 79 y.o. male, DOB - 12-10-1940, [id number]  Outpatient Primary MD for the patient is [name], MD    LOS - 2  Admit date - 10/16/2020    Chief Complaint  Patient presents with  . Weakness       Brief Narrative    HPI: Bill [name] is a 79 y.o. male with history of diabetes mellitus type 2, COPD, nonobstructive CAD presents to the ER because of ongoing weakness with nausea vomiting and persistent cough for the last 1 week since being diagnosed with Covid infection on October 07, 2020.  Patient is not feeling any short of breath but feeling intensely weak and fatigued.  Denies chest pain fever or chills.  ED Course: In the ER patient blood pressure was in the low normal EKG shows normal sinus rhythm patient is afebrile but hypoxic requiring 2 to 3 L to maintain sats.  CT angiogram of the chest is negative for PE but does show multifocal pneumonia.  Patient also was having low normal blood pressure for which patient was given normal saline bolus.  Labs show mild hypokalemia inflammatory markers are pending.  Covid test was positive.  Patient admitted for Covid infection with respiratory failure and dehydration.   Subjective:    Bill [name] today report generalized weakness, cough, and dyspnea.     Assessment  & Plan :    Principal Problem:   Acute respiratory failure due to COVID-19 [facility]) Active Problems:   CAD (coronary artery disease)   COPD (chronic obstructive pulmonary disease) with emphysema (HCC)   Type 2 diabetes  mellitus with hyperglycemia, without long-term current  use of insulin (HCC)   Pneumonia due to COVID-19 virus   COVID-19 virus infection  Acute respiratory failure with hypoxia secondary to Covid pneumonia  -Patient is unvaccinated  -Patient is on 4 L nasal cannula this morning, saturating 84% on room air when I try to wean him off oxygen . -Continue with IV steroids . -Continue with IV remdesivir . -Was encouraged to use incentive spirometry, flutter valve, and to get out of bed to chair, will consult PT/OT . -  If patient requires further up titration of oxygen may require baricitinib or Actemra for which patient has consented.   Given with increased oxygen requirement, he will be started on baricitinib. -Continue to trend inflammatory markers  SpO2: 91 % O2 Flow Rate (L/min): 4 L/min  COVID-19 Labs  Recent Labs    10/17/20 0500 10/18/20 0116 10/19/20 0103  DDIMER 0.73* 0.52* 0.55*  FERRITIN 1,016*  --   --   LDH 304*  --   --   CRP 5.5* 5.4* 1.3*    Lab Results  Component Value Date   SARSCOV2NAA Detected (A) 10/07/2020   [place] NEGATIVE 06/05/2019     Dehydration:  -Resolved with IV fluids  Diabetes mellitus type 2 with hyperglycemia -  has not been taking his Metformin for last few days due to persistent vomiting and poor appetite. -With Lantus, Tradjenta, and insulin sliding scale . -A1c is 7.9.  Hypertension -Blood pressure remains soft, continue to hold bisoprolol  COPD  -not actively wheezing continue home inhalers.  Nonobstructive CAD  -denies any chest pain EKG unremarkable troponins are pending.   SpO2: 91 % O2 Flow Rate (L/min): 4 L/min  Recent Labs  Lab 10/16/20 2017 10/17/20 0158 10/17/20 0500 10/17/20 0628 10/17/20 2058 10/18/20 0116 10/19/20 0103  WBC 11.0*  --   --  8.8  --  9.4 13.7*  PLT 208  --   --  179  --  184 249  CRP  --   --  5.5*  --   --  5.4* 1.3*  DDIMER  --   --  0.73*  --   --  0.52* 0.55*  PROCALCITON  --   --  <0.10  --   --   --   --   AST  --   --  38  --   --   30 24  ALT  --   --  36  --   --  34 30  ALKPHOS  --   --  78  --   --  71 70  BILITOT  --   --  1.1  --   --  0.8 0.6  ALBUMIN  --   --  2.7*  --   --  2.6* 2.6*  LATICACIDVEN  --  2.2* 1.8  --  1.8  --   --        ABG     Component Value Date/Time   TCO2 29 03/16/2008 0414          Condition - Extremely Guarded  Family Communication  : D/W fiance Bill [name]  by phone 12/23, left voicemail on 12/25  Code Status :  DNR  Consults  :  none  Disposition Plan  :    Status is: Inpatient  Remains inpatient appropriate because:IV treatments appropriate due to intensity of illness or inability to take PO   Dispo: The patient is from: Home  Anticipated d/c is to: Home              Anticipated d/c date is: 3 days              Patient currently is not medically stable to d/c.      DVT Prophylaxis  :  Lovenox   Lab Results  Component Value Date   PLT 249 10/19/2020    Diet :  Diet Order            Diet heart healthy/carb modified Room service appropriate? Yes; Fluid consistency: Thin  Diet effective now                  Inpatient Medications  Scheduled Meds: . vitamin C  500 mg Oral Daily  . aspirin EC  81 mg Oral Daily  . enoxaparin (LOVENOX) injection  40 mg Subcutaneous Q24H  . insulin aspart  0-15 Units Subcutaneous TID WC  . insulin aspart  0-5 Units Subcutaneous QHS  . insulin glargine  14 Units Subcutaneous Daily  . linagliptin  5 mg Oral Daily  . methylPREDNISolone (SOLU-MEDROL) injection  0.5 mg/kg Intravenous Q12H   Followed by  . [START ON 10/20/2020] predniSONE  50 mg Oral Daily  . pantoprazole  40 mg Oral Daily  . cyanocobalamin  1,000 mcg Oral Daily  . zinc sulfate  220 mg Oral Daily   Continuous Infusions: . remdesivir 100 mg in NS 100 mL 100 mg (10/19/20 0822)   PRN Meds:.acetaminophen **OR** acetaminophen, albuterol, chlorpheniramine-HYDROcodone, guaiFENesin-dextromethorphan, ondansetron (ZOFRAN) IV  Antibiotics  :     Anti-infectives (From admission, onward)   Start     Dose/Rate Route Frequency Ordered Stop   10/18/20 1000  remdesivir 100 mg in sodium chloride 0.9 % 100 mL IVPB       "Followed by" Linked Group Details   100 mg 200 mL/hr over 30 Minutes Intravenous Daily 10/17/20 0454 10/22/20 0959   10/17/20 0530  remdesivir 200 mg in sodium chloride 0.9% 250 mL IVPB       "Followed by" Linked Group Details   200 mg 580 mL/hr over 30 Minutes Intravenous Once 10/17/20 0454 10/17/20 3419         Emeline Gins Jaiya Mooradian M.D on 10/19/2020 at 11:00 AM  To page go to [email or web address]  [facility] -  Office  [phone]       Objective:   Vitals:   10/18/20 2035 10/19/20 0423 10/19/20 0617 10/19/20 0717  BP:   107/81 140/74  Pulse:   68 66  Resp:   20 16  Temp:   98.3 F (36.8 C) 97.7 F (36.5 C)  TempSrc:   Axillary Oral  SpO2: 91%  95% 91%  Weight:  77.1 kg    Height:        Wt Readings from Last 3 Encounters:  10/19/20 77.1 kg  09/26/20 77.1 kg  07/17/20 76.7 kg     Intake/Output Summary (Last 24 hours) at 10/19/2020 1100 Last data filed at 10/18/2020 1839 Gross per 24 hour  Intake 240 ml  Output --  Net 240 ml     Physical Exam  Awake Alert, Oriented X 3,frail, No new F.N deficits, Normal affect Symmetrical Chest wall movement, Good air movement bilaterally, CTAB RRR,No Gallops,Rubs or new Murmurs, No Parasternal Heave +ve B.Sounds, Abd Soft, No tenderness, No rebound - guarding or rigidity. No Cyanosis, Clubbing or edema, No new Rash or bruise      Data Review:    CBC  Recent Labs  Lab 10/16/20 2017 10/17/20 0628 10/18/20 0116 10/19/20 0103  WBC 11.0* 8.8 9.4 13.7*  HGB 15.5 14.1 14.4 14.0  HCT 44.5 41.5 39.8 40.9  PLT 208 179 184 249  MCV 90.3 93.9 89.0 90.3  MCH 31.4 31.9 32.2 30.9  MCHC 34.8 34.0 36.2* 34.2  RDW 17.1* 17.1* 16.6* 16.5*  LYMPHSABS  --   --  0.6* 0.5*  MONOABS  --   --  0.3 0.4  EOSABS  --   --  0.0 0.0  BASOSABS  --   --   0.0 0.0    Recent Labs  Lab 10/16/20 2017 10/17/20 0158 10/17/20 0500 10/17/20 2058 10/18/20 0116 10/18/20 0735 10/19/20 0103  NA 133*  --   --   --  133*  --  136  K 3.3*  --   --   --  3.6  --  4.1  CL 99  --   --   --  103  --  104  CO2 21*  --   --   --  21*  --  24  GLUCOSE 204*  --   --   --  281*  --  223*  BUN 24*  --   --   --  16  --  16  CREATININE 0.93  --  0.83  --  0.67  --  0.68  CALCIUM 8.3*  --   --   --  8.4*  --  8.6*  AST  --   --  38  --  30  --  24  ALT  --   --  36  --  34  --  30  ALKPHOS  --   --  78  --  71  --  70  BILITOT  --   --  1.1  --  0.8  --  0.6  ALBUMIN  --   --  2.7*  --  2.6*  --  2.6*  CRP  --   --  5.5*  --  5.4*  --  1.3*  DDIMER  --   --  0.73*  --  0.52*  --  0.55*  PROCALCITON  --   --  <0.10  --   --   --   --   LATICACIDVEN  --  2.2* 1.8 1.8  --   --   --   HGBA1C  --   --   --   --   --  7.9*  --     ------------------------------------------------------------------------------------------------------------------ Recent Labs    10/17/20 0158  TRIG 63    Lab Results  Component Value Date   HGBA1C 7.9 (H) 10/18/2020   ------------------------------------------------------------------------------------------------------------------ No results for input(s): TSH, T4TOTAL, T3FREE, THYROIDAB in the last 72 hours.  Invalid input(s): FREET3  Cardiac Enzymes No results for input(s): CKMB, TROPONINI, MYOGLOBIN in the last 168 hours.  Invalid input(s): CK ------------------------------------------------------------------------------------------------------------------ No results found for: BNP  Micro Results Recent Results (from the past 240 hour(s))  Blood Culture (routine x 2)     Status: None (Preliminary result)   Collection Time: 10/17/20  1:58 AM   Specimen: BLOOD  Result Value Ref Range Status   Specimen Description BLOOD SITE NOT SPECIFIED  Final   Special Requests   Final    BOTTLES DRAWN AEROBIC AND ANAEROBIC  Blood Culture adequate volume   Culture   Final    NO GROWTH 1 DAY Performed at [facility], [address] [place], [place] [address]    Report Status PENDING  Incomplete    Radiology Reports CT Angio Chest PE W and/or Wo Contrast  Result Date: 10/17/2020 CLINICAL DATA:  Shortness of breath, hypoxia EXAM: CT ANGIOGRAPHY CHEST WITH CONTRAST TECHNIQUE: Multidetector CT imaging of the chest was performed using the standard protocol during bolus administration of intravenous contrast. Multiplanar CT image reconstructions and MIPs were obtained to evaluate the vascular anatomy. CONTRAST:  36mL OMNIPAQUE IOHEXOL 350 MG/ML SOLN COMPARISON:  CT 09/17/2009 FINDINGS: Cardiovascular: Satisfactory opacification the pulmonary arteries to the segmental level. No pulmonary artery filling defects are identified. Central pulmonary arteries are top-normal caliber. Borderline cardiomegaly. Three-vessel coronary artery calcification. No pericardial effusion. Suboptimal opacification of the thoracic aorta for luminal assessment. Atherosclerotic plaque within the normal caliber aorta. Normal 3 vessel branching of the aortic arch. Proximal great vessels are mildly calcified but unremarkable. No major venous abnormalities. Mediastinum/Nodes: No mediastinal fluid or gas. Normal thyroid gland and thoracic inlet. No acute abnormality of the trachea. Small hiatal hernia. Fluid in the thoracic esophagus, could reflect some mild reflux. No esophageal thickening or inflammatory change. Scattered subcentimeter mediastinal and hilar nodes are favored to be reactive. No worrisome enlarged mediastinal, hilar or axillary adenopathy by size criteria. Lungs/Pleura: Diffuse heterogeneous geographic areas of ground-glass and consolidative opacity are present in both lungs distributed largely in the lung periphery. There is a background of paraseptal predominant emphysematous changes in both lungs. Diffuse airways thickening could  reflect a combination of acute and chronic airways disease. No pneumothorax. No effusion. Stable 6 mm perifissural nodule along the right major fissure(11/72), 8 mm right middle lobe nodule (11/101), and 3 mm subpleural nodule in the posterior left lower lobe (11/102). 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 Prior cholecystectomy. Geographic focal fatty infiltration along the falciform ligament. Small accessory splenule. Upper abdominal atherosclerotic calcifications. Musculoskeletal: Multilevel degenerative changes are present in the imaged portions of the spine. Mild wedging and focal kyphosis at the thoracolumbar junction, possibly degenerative or remote though could correlate for point tenderness. Some dextrocurvature of the midthoracic spine is noted as well. No other acute or suspicious osseous abnormalities. Degenerative changes of the bilateral shoulders. No worrisome chest wall masses or lesions. Review of the MIP images confirms the above findings. IMPRESSION: 1. No evidence of acute pulmonary artery filling defects to suggest pulmonary embolism. 2. Diffuse heterogeneous geographic areas of ground-glass and consolidative opacity in both lungs distributed largely in the lung periphery, concerning for multifocal pneumonia including potential atypical viral etiologies such as COVID-19. 3. Mild wedging and focal kyphosis at the thoracolumbar junction, possibly degenerative or remote though could correlate for point tenderness to exclude acuity. 4. 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 5. Small hiatal hernia with fluid in the thoracic esophagus, correlate for symptoms of reflux. 6. Few stable small pulmonary nodules, almost certainly benign. 7. Aortic Atherosclerosis (ICD10-I70.0) 8. Emphysema (ICD10-J43.9). Electronically Signed   By: Lovena Le M.D.   On: 10/17/2020 02:36   DG Chest Portable 1 View  Result Date: 10/16/2020 CLINICAL DATA:  COVID-19 diagnosed 10/07/2020,  weakness, loss of appetite EXAM: PORTABLE CHEST 1 VIEW COMPARISON:  06/02/2019 FINDINGS: Single frontal view of the chest was obtained, with the patient rotated toward the left. There is diffuse interstitial prominence, chronic. There is patchy consolidation at the left lung base which could reflect bronchopneumonia. No effusion or pneumothorax. IMPRESSION: 1. Patchy left lower lobe bronchopneumonia. 2. Chronic interstitial scarring. Electronically Signed   By: Legrand Como  Owens Shark M.D.   On: 10/16/2020 20:55

## 2020-10-20 DIAGNOSIS — U071 COVID-19: Secondary | ICD-10-CM | POA: Diagnosis not present

## 2020-10-20 DIAGNOSIS — E1165 Type 2 diabetes mellitus with hyperglycemia: Secondary | ICD-10-CM | POA: Diagnosis not present

## 2020-10-20 DIAGNOSIS — J9600 Acute respiratory failure, unspecified whether with hypoxia or hypercapnia: Secondary | ICD-10-CM | POA: Diagnosis not present

## 2020-10-20 LAB — CBC WITH DIFFERENTIAL/PLATELET
Abs Immature Granulocytes: 0.21 10*3/uL — ABNORMAL HIGH (ref 0.00–0.07)
Basophils Absolute: 0 10*3/uL (ref 0.0–0.1)
Basophils Relative: 0 %
Eosinophils Absolute: 0 10*3/uL (ref 0.0–0.5)
Eosinophils Relative: 0 %
HCT: 38.8 % — ABNORMAL LOW (ref 39.0–52.0)
Hemoglobin: 13.6 g/dL (ref 13.0–17.0)
Immature Granulocytes: 1 %
Lymphocytes Relative: 3 %
Lymphs Abs: 0.5 10*3/uL — ABNORMAL LOW (ref 0.7–4.0)
MCH: 31.7 pg (ref 26.0–34.0)
MCHC: 35.1 g/dL (ref 30.0–36.0)
MCV: 90.4 fL (ref 80.0–100.0)
Monocytes Absolute: 0.7 10*3/uL (ref 0.1–1.0)
Monocytes Relative: 4 %
Neutro Abs: 13.7 10*3/uL — ABNORMAL HIGH (ref 1.7–7.7)
Neutrophils Relative %: 92 %
Platelets: 258 10*3/uL (ref 150–400)
RBC: 4.29 MIL/uL (ref 4.22–5.81)
RDW: 16.8 % — ABNORMAL HIGH (ref 11.5–15.5)
WBC: 15.1 10*3/uL — ABNORMAL HIGH (ref 4.0–10.5)
nRBC: 0 % (ref 0.0–0.2)

## 2020-10-20 LAB — COMPREHENSIVE METABOLIC PANEL
ALT: 30 U/L (ref 0–44)
AST: 24 U/L (ref 15–41)
Albumin: 2.5 g/dL — ABNORMAL LOW (ref 3.5–5.0)
Alkaline Phosphatase: 63 U/L (ref 38–126)
Anion gap: 8 (ref 5–15)
BUN: 17 mg/dL (ref 8–23)
CO2: 27 mmol/L (ref 22–32)
Calcium: 8.4 mg/dL — ABNORMAL LOW (ref 8.9–10.3)
Chloride: 102 mmol/L (ref 98–111)
Creatinine, Ser: 0.78 mg/dL (ref 0.61–1.24)
GFR, Estimated: >60 mL/min (ref >60)
Glucose, Bld: 254 mg/dL — ABNORMAL HIGH (ref 70–99)
Potassium: 4 mmol/L (ref 3.5–5.1)
Sodium: 137 mmol/L (ref 135–145)
Total Bilirubin: 0.9 mg/dL (ref 0.3–1.2)
Total Protein: 5.6 g/dL — ABNORMAL LOW (ref 6.5–8.1)

## 2020-10-20 LAB — GLUCOSE, CAPILLARY
Glucose-Capillary: 325 mg/dL — ABNORMAL HIGH (ref 70–99)
Glucose-Capillary: 252 mg/dL — ABNORMAL HIGH (ref 70–99)
Glucose-Capillary: 245 mg/dL — ABNORMAL HIGH (ref 70–99)
Glucose-Capillary: 236 mg/dL — ABNORMAL HIGH (ref 70–99)

## 2020-10-20 LAB — C-REACTIVE PROTEIN: CRP: 1.1 mg/dL — ABNORMAL HIGH (ref <1.0)

## 2020-10-20 LAB — D-DIMER, QUANTITATIVE: D-Dimer, Quant: 0.73 ug/mL-FEU — ABNORMAL HIGH (ref 0.00–0.50)

## 2020-10-20 NOTE — Progress Notes (Signed)
SATURATION QUALIFICATIONS: (This note is used to comply with regulatory documentation for home oxygen)  Patient Saturations on Room Air at Rest = 93%  Patient Saturations on Room Air while Ambulating = 85%  Patient Saturations on 3 Liters of oxygen while Ambulating = 89 - 91%  Please briefly explain why patient needs home oxygen:Pt requiring 3L to maintain SpO2 89-91 at this time for mobility and ADL tasks.   Maurie Boettcher, OT/L   Acute OT Clinical Specialist Acute Rehabilitation Services Pager [phone] Office [phone]

## 2020-10-20 NOTE — Progress Notes (Signed)
Occupational Therapy Evaluation Patient Details Name: Bill [name] MRN: [id number] DOB: 1941/03/18 Today's Date: 10/20/2020    History of Present Illness 79 yo male admitted 12/22 with respiratory failure and dehydration due to Covid 19 diagnosed 12/13. PMHx: CAD, COPD, DM   Clinical Impression   PTA pt independent with mobility and ADL and enjoys playing guitar and painting. Pt requires min guard A with mobility due to 1 LOB to R when turning @ RW level. Overall set up/S with ADL tasks however requires cues for activity modification and energy conservation. Pt desats to 85 on RA with mobility. Ambulated @ 300 ft with 2 standing rest breaks on 3L with SpO2 89-91 and 1/4 DOE. Requires vc to not talk when walking to maintain O2 sats above 88. Recommend follow up with HHOT to facilitate return to prior level of function with ADL and IADL tasks and reduce risk of falls. Will follow acutely.     Follow Up Recommendations  Home health OT;Supervision - Intermittent    Equipment Recommendations  3 in 1 bedside commode;Other (comment) (RW; BSC to use as shower chair)    Recommendations for Other Services       Precautions / Restrictions Precautions Precautions: Fall      Mobility Bed Mobility Overal bed mobility: Modified Independent                  Transfers Overall transfer level: Needs assistance   Transfers: Sit to/from Stand Sit to Stand: Supervision              Balance Overall balance assessment: Needs assistance   Sitting balance-Leahy Scale: Good       Standing balance-Leahy Scale: Fair                             ADL either performed or assessed with clinical judgement   ADL Overall ADL's : Needs assistance/impaired     Grooming: Standing;Min guard   Upper Body Bathing: Set up;Sitting   Lower Body Bathing: Min guard;Sit to/from stand   Upper Body Dressing : Set up;Sitting   Lower Body Dressing: Min guard;Sit to/from stand    Toilet Transfer: Min guard;Ambulation   Toileting- Clothing Manipulation and Hygiene: Supervision/safety       Functional mobility during ADLs: Min guard;Rolling walker;Cueing for safety General ADL Comments: vc for safe use of RW; pt picking up the RW at times; 1 LOB toward R when turning, requiring physical assist to prevent fall     Vision         Perception     Praxis      Pertinent Vitals/Pain Pain Assessment: No/denies pain     Hand Dominance Right   Extremity/Trunk Assessment Upper Extremity Assessment Upper Extremity Assessment: Overall WFL for tasks assessed   Lower Extremity Assessment Lower Extremity Assessment: Defer to PT evaluation   Cervical / Trunk Assessment Cervical / Trunk Assessment: Normal   Communication Communication Communication: HOH   Cognition Arousal/Alertness: Awake/alert Behavior During Therapy: WFL for tasks assessed/performed Overall Cognitive Status: No family/caregiver present to determine baseline cognitive functioning                                 General Comments: has had memory deficits since abdominal procedure; slow processing; impaired   General Comments  enjoys painting    Exercises Exercises: Other exercises Other Exercises Other Exercises: level  2 general UE strengthening Other Exercises: flutter valve x 5. instructed pt on correct technique as he was "sucking" and holding valve upside down Other Exercises: incentive spirometer x 5 - able to pull 1737ml   Shoulder Instructions      Home Living Family/patient expects to be discharged to:: Private residence Living Arrangements: Spouse/significant other Available Help at Discharge: Family;Available 24 hours/day Type of Home: House Home Access: Stairs to enter [employer] of Steps: 5 Entrance Stairs-Rails: Right       Bathroom Shower/Tub: Walk-in shower;Door   [employer] Toilet: Handicapped height Bathroom Accessibility: Yes How  Accessible: Accessible via walker Home Equipment: [place] - 2 wheels;Cane - single point          Prior Functioning/Environment Level of Independence: Independent        Comments: "I'm wide open". likes to do home projects; paints        OT Problem List: Decreased strength;Decreased activity tolerance;Impaired balance (sitting and/or standing);Decreased cognition;Decreased safety awareness;Decreased knowledge of use of DME or AE;Cardiopulmonary status limiting activity      OT Treatment/Interventions: Self-care/ADL training;Therapeutic exercise;Energy conservation;DME and/or AE instruction;Therapeutic activities;Cognitive remediation/compensation;Patient/family education;Balance training    OT Goals(Current goals can be found in the care plan section) Acute Rehab OT Goals 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 OT Goal Formulation: With patient Time For Goal Achievement: 11/03/20 Potential to Achieve Goals: Good  OT Frequency: Min 3X/week   Barriers to D/C:            Co-evaluation              AM-PAC OT "6 Clicks" Daily Activity     Outcome Measure Help from another person eating meals?: None Help from another person taking care of personal grooming?: A Little Help from another person toileting, which includes using toliet, bedpan, or urinal?: A Little Help from another person bathing (including washing, rinsing, drying)?: A Little Help from another person to put on and taking off regular upper body clothing?: A Little Help from another person to put on and taking off regular lower body clothing?: A Little 6 Click Score: 19   End of Session Equipment Utilized During Treatment: Gait belt;Rolling walker;Oxygen (3L) Nurse Communication: Mobility status  Activity Tolerance: Patient tolerated treatment well Patient left: in chair;with call bell/phone within reach  OT Visit Diagnosis: Unsteadiness on feet (R26.81);Other abnormalities of gait and mobility  (R26.89);Muscle weakness (generalized) (M62.81);Other symptoms and signs involving cognitive function                Time: 1505-1550 OT Time Calculation (min): 45 min Charges:  OT General Charges $OT Visit: 1 Visit OT Evaluation $OT Eval Moderate Complexity: 1 Mod OT Treatments $Self Care/Home Management : 23-37 mins  Maurie Boettcher, OT/L   Acute OT Clinical Specialist Acute Rehabilitation Services Pager [phone] Office [phone]   [facility] 10/20/2020, 5:35 PM

## 2020-10-20 NOTE — Progress Notes (Signed)
PROGRESS NOTE                                                                             PROGRESS NOTE                                                                                                                                                                                                             Patient Demographics:    Bill [name], is a 79 y.o. male, DOB - Jun 30, 1941, [id number]  Outpatient Primary MD for the patient is [name], MD    LOS - 3  Admit date - 10/16/2020    Chief Complaint  Patient presents with  . Weakness       Brief Narrative    HPI: TAHEIM FITT is a 79 y.o. male with history of diabetes mellitus type 2, COPD, nonobstructive CAD presents to the ER because of ongoing weakness with nausea vomiting and persistent cough for the last 1 week since being diagnosed with Covid infection on October 07, 2020.  Patient is not feeling any short of breath but feeling intensely weak and fatigued.  Denies chest pain fever or chills.  ED Course: In the ER patient blood pressure was in the low normal EKG shows normal sinus rhythm patient is afebrile but hypoxic requiring 2 to 3 L to maintain sats.  CT angiogram of the chest is negative for PE but does show multifocal pneumonia.  Patient also was having low normal blood pressure for which patient was given normal saline bolus.  Labs show mild hypokalemia inflammatory markers are pending.  Covid test was positive.  Patient admitted for Covid infection with respiratory failure and dehydration.   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 -Continue with IV steroids . -Continue with IV remdesivir . -Was encouraged to use incentive spirometry, flutter valve, and to get out of bed to chair, will consult PT/OT . -Continue with baricitinib. SpO2: 96 % O2 Flow Rate (L/min): 2 L/min  COVID-19 Labs  Recent Labs    10/18/20 0116 10/19/20 0103 10/20/20 0443  DDIMER 0.52* 0.55* 0.73*  CRP 5.4* 1.3* 1.1*    Lab Results  Component Value Date   SARSCOV2NAA Detected (A) 10/07/2020   [place] NEGATIVE 06/05/2019     Dehydration:  -Resolved with IV fluids  Diabetes mellitus type 2 with hyperglycemia -  has not been taking his Metformin for last few days due to persistent vomiting and poor appetite. -With Lantus, Tradjenta, and insulin sliding scale . -A1c is 7.9.  Hypertension -Blood pressure remains soft, continue to hold bisoprolol  COPD  -not actively wheezing continue home inhalers.  Nonobstructive CAD  -denies any chest pain EKG unremarkable troponins are pending.   SpO2: 96 % O2 Flow Rate (L/min): 2 L/min  Recent Labs  Lab 10/16/20 2017 10/17/20 0158 10/17/20 0500 10/17/20 0628 10/17/20 2058 10/18/20 0116 10/19/20 0103 10/20/20 0443  WBC 11.0*  --   --  8.8  --  9.4 13.7* 15.1*  PLT 208  --   --  179  --  184 249 258  CRP  --   --  5.5*  --   --  5.4* 1.3* 1.1*  DDIMER  --   --  0.73*  --   --  0.52* 0.55* 0.73*  PROCALCITON  --   --  <0.10  --   --   --   --   --   AST  --   --  38  --   --  30 24 24   ALT  --   --  36  --   --  34 30 30  ALKPHOS  --   --  78  --   --  71 70 63   BILITOT  --   --  1.1  --   --  0.8 0.6 0.9  ALBUMIN  --   --  2.7*  --   --  2.6* 2.6* 2.5*  LATICACIDVEN  --  2.2* 1.8  --  1.8  --   --   --        ABG     Component Value Date/Time   TCO2 29 03/16/2008 0414          Condition - Extremely Guarded  Family Communication  : D/W fiance Pat  by phone 12/23, left voicemail on 12/25.  Code Status :  DNR  Consults  :  none  Disposition Plan  :    Status is: Inpatient  Remains inpatient appropriate because:IV treatments appropriate due to intensity of illness or inability to take PO   Dispo: The patient is from: Home              Anticipated d/c is to: Home  Anticipated d/c date is: 1 day              Patient currently is not medically stable to d/c.      DVT Prophylaxis  :  Lovenox   Lab Results  Component Value Date   PLT 258 10/20/2020    Diet :  Diet Order            Diet heart healthy/carb modified Room service appropriate? Yes; Fluid consistency: Thin  Diet effective now                  Inpatient Medications  Scheduled Meds: . vitamin C  500 mg Oral Daily  . aspirin EC  81 mg Oral Daily  . baricitinib  4 mg Oral Daily  . enoxaparin (LOVENOX) injection  40 mg Subcutaneous Q24H  . insulin aspart  0-15 Units Subcutaneous TID WC  . insulin aspart  0-5 Units Subcutaneous QHS  . insulin glargine  14 Units Subcutaneous Daily  . linagliptin  5 mg Oral Daily  . methylPREDNISolone (SOLU-MEDROL) injection  0.5 mg/kg Intravenous Q12H  . pantoprazole  40 mg Oral Daily  . cyanocobalamin  1,000 mcg Oral Daily  . zinc sulfate  220 mg Oral Daily   Continuous Infusions: . remdesivir 100 mg in NS 100 mL 100 mg (10/20/20 0929)   PRN Meds:.acetaminophen **OR** acetaminophen, albuterol, chlorpheniramine-HYDROcodone, guaiFENesin-dextromethorphan, ondansetron (ZOFRAN) IV  Antibiotics  :    Anti-infectives (From admission, onward)   Start     Dose/Rate Route Frequency Ordered Stop   10/18/20  1000  remdesivir 100 mg in sodium chloride 0.9 % 100 mL IVPB       "Followed by" Linked Group Details   100 mg 200 mL/hr over 30 Minutes Intravenous Daily 10/17/20 0454 10/22/20 0959   10/17/20 0530  remdesivir 200 mg in sodium chloride 0.9% 250 mL IVPB       "Followed by" Linked Group Details   200 mg 580 mL/hr over 30 Minutes Intravenous Once 10/17/20 0454 10/17/20 0998         Emeline Gins Christopherjohn Schiele M.D on 10/20/2020 at 11:43 AM  To page go to [email or web address]  [facility] -  Office  [phone]       Objective:   Vitals:   10/19/20 2107 10/20/20 0457 10/20/20 0500 10/20/20 0758  BP: 135/70 (!) 138/94  (!) 144/82  Pulse: 85 82  72  Resp: (!) 21 20  20   Temp: 98.1 F (36.7 C) 98.5 F (36.9 C)  98.7 F (37.1 C)  TempSrc: Axillary Oral  Oral  SpO2: 96% 94%  96%  Weight:   74.4 kg   Height:        Wt Readings from Last 3 Encounters:  10/20/20 74.4 kg  09/26/20 77.1 kg  07/17/20 76.7 kg    No intake or output data in the 24 hours ending 10/20/20 1143   Physical Exam  Awake Alert, Oriented X 3, No new F.N deficits, Normal affect Symmetrical Chest wall movement, Good air movement bilaterally, CTAB RRR,No Gallops,Rubs or new Murmurs, No Parasternal Heave +ve B.Sounds, Abd Soft, No tenderness, No rebound - guarding or rigidity. No Cyanosis, Clubbing or edema, No new Rash or bruise       Data Review:    CBC Recent Labs  Lab 10/16/20 2017 10/17/20 0628 10/18/20 0116 10/19/20 0103 10/20/20 0443  WBC 11.0* 8.8 9.4 13.7* 15.1*  HGB 15.5 14.1 14.4 14.0 13.6  HCT 44.5 41.5 39.8 40.9 38.8*  PLT 208  179 184 249 258  MCV 90.3 93.9 89.0 90.3 90.4  MCH 31.4 31.9 32.2 30.9 31.7  MCHC 34.8 34.0 36.2* 34.2 35.1  RDW 17.1* 17.1* 16.6* 16.5* 16.8*  LYMPHSABS  --   --  0.6* 0.5* 0.5*  MONOABS  --   --  0.3 0.4 0.7  EOSABS  --   --  0.0 0.0 0.0  BASOSABS  --   --  0.0 0.0 0.0    Recent Labs  Lab 10/16/20 2017 10/17/20 0158 10/17/20 0500 10/17/20 2058  10/18/20 0116 10/18/20 0735 10/19/20 0103 10/20/20 0443  NA 133*  --   --   --  133*  --  136 137  K 3.3*  --   --   --  3.6  --  4.1 4.0  CL 99  --   --   --  103  --  104 102  CO2 21*  --   --   --  21*  --  24 27  GLUCOSE 204*  --   --   --  281*  --  223* 254*  BUN 24*  --   --   --  16  --  16 17  CREATININE 0.93  --  0.83  --  0.67  --  0.68 0.78  CALCIUM 8.3*  --   --   --  8.4*  --  8.6* 8.4*  AST  --   --  38  --  30  --  24 24  ALT  --   --  36  --  34  --  30 30  ALKPHOS  --   --  78  --  71  --  70 63  BILITOT  --   --  1.1  --  0.8  --  0.6 0.9  ALBUMIN  --   --  2.7*  --  2.6*  --  2.6* 2.5*  CRP  --   --  5.5*  --  5.4*  --  1.3* 1.1*  DDIMER  --   --  0.73*  --  0.52*  --  0.55* 0.73*  PROCALCITON  --   --  <0.10  --   --   --   --   --   LATICACIDVEN  --  2.2* 1.8 1.8  --   --   --   --   HGBA1C  --   --   --   --   --  7.9*  --   --     ------------------------------------------------------------------------------------------------------------------ No results for input(s): CHOL, HDL, LDLCALC, TRIG, CHOLHDL, LDLDIRECT in the last 72 hours.  Lab Results  Component Value Date   HGBA1C 7.9 (H) 10/18/2020   ------------------------------------------------------------------------------------------------------------------ No results for input(s): TSH, T4TOTAL, T3FREE, THYROIDAB in the last 72 hours.  Invalid input(s): FREET3  Cardiac Enzymes No results for input(s): CKMB, TROPONINI, MYOGLOBIN in the last 168 hours.  Invalid input(s): CK ------------------------------------------------------------------------------------------------------------------ No results found for: BNP  Micro Results Recent Results (from the past 240 hour(s))  Blood Culture (routine x 2)     Status: None (Preliminary result)   Collection Time: 10/17/20  1:58 AM   Specimen: BLOOD  Result Value Ref Range Status   Specimen Description BLOOD SITE NOT SPECIFIED  Final   Special Requests    Final    BOTTLES DRAWN AEROBIC AND ANAEROBIC Blood Culture adequate volume   Culture   Final    NO GROWTH 2 DAYS Performed at  [facility], [place] [address], [place], [place] [address]    Report Status PENDING  Incomplete    Radiology Reports CT Angio Chest PE W and/or Wo Contrast  Result Date: 10/17/2020 CLINICAL DATA:  Shortness of breath, hypoxia EXAM: CT ANGIOGRAPHY CHEST WITH CONTRAST TECHNIQUE: Multidetector CT imaging of the chest was performed using the standard protocol during bolus administration of intravenous contrast. Multiplanar CT image reconstructions and MIPs were obtained to evaluate the vascular anatomy. CONTRAST:  34mL OMNIPAQUE IOHEXOL 350 MG/ML SOLN COMPARISON:  CT 09/17/2009 FINDINGS: Cardiovascular: Satisfactory opacification the pulmonary arteries to the segmental level. No pulmonary artery filling defects are identified. Central pulmonary arteries are top-normal caliber. Borderline cardiomegaly. Three-vessel coronary artery calcification. No pericardial effusion. Suboptimal opacification of the thoracic aorta for luminal assessment. Atherosclerotic plaque within the normal caliber aorta. Normal 3 vessel branching of the aortic arch. Proximal great vessels are mildly calcified but unremarkable. No major venous abnormalities. Mediastinum/Nodes: No mediastinal fluid or gas. Normal thyroid gland and thoracic inlet. No acute abnormality of the trachea. Small hiatal hernia. Fluid in the thoracic esophagus, could reflect some mild reflux. No esophageal thickening or inflammatory change. Scattered subcentimeter mediastinal and hilar nodes are favored to be reactive. No worrisome enlarged mediastinal, hilar or axillary adenopathy by size criteria. Lungs/Pleura: Diffuse heterogeneous geographic areas of ground-glass and consolidative opacity are present in both lungs distributed largely in the lung periphery. There is a background of paraseptal predominant emphysematous changes in  both lungs. Diffuse airways thickening could reflect a combination of acute and chronic airways disease. No pneumothorax. No effusion. Stable 6 mm perifissural nodule along the right major fissure(11/72), 8 mm right middle lobe nodule (11/101), and 3 mm subpleural nodule in the posterior left lower lobe (11/102). 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 Prior cholecystectomy. Geographic focal fatty infiltration along the falciform ligament. Small accessory splenule. Upper abdominal atherosclerotic calcifications. Musculoskeletal: Multilevel degenerative changes are present in the imaged portions of the spine. Mild wedging and focal kyphosis at the thoracolumbar junction, possibly degenerative or remote though could correlate for point tenderness. Some dextrocurvature of the midthoracic spine is noted as well. No other acute or suspicious osseous abnormalities. Degenerative changes of the bilateral shoulders. No worrisome chest wall masses or lesions. Review of the MIP images confirms the above findings. IMPRESSION: 1. No evidence of acute pulmonary artery filling defects to suggest pulmonary embolism. 2. Diffuse heterogeneous geographic areas of ground-glass and consolidative opacity in both lungs distributed largely in the lung periphery, concerning for multifocal pneumonia including potential atypical viral etiologies such as COVID-19. 3. Mild wedging and focal kyphosis at the thoracolumbar junction, possibly degenerative or remote though could correlate for point tenderness to exclude acuity. 4. 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 5. Small hiatal hernia with fluid in the thoracic esophagus, correlate for symptoms of reflux. 6. Few stable small pulmonary nodules, almost certainly benign. 7. Aortic Atherosclerosis (ICD10-I70.0) 8. Emphysema (ICD10-J43.9). Electronically Signed   By: Lovena Le M.D.   On: 10/17/2020 02:36   DG Chest Portable 1 View  Result Date: 10/16/2020 CLINICAL  DATA:  COVID-19 diagnosed 10/07/2020, weakness, loss of appetite EXAM: PORTABLE CHEST 1 VIEW COMPARISON:  06/02/2019 FINDINGS: Single frontal view of the chest was obtained, with the patient rotated toward the left. There is diffuse interstitial prominence, chronic. There is patchy consolidation at the left lung base which could reflect bronchopneumonia. No effusion or pneumothorax. IMPRESSION: 1. Patchy left lower lobe bronchopneumonia. 2. Chronic interstitial scarring. Electronically  Signed   By: Randa Ngo M.D.   On: 10/16/2020 20:55

## 2020-10-21 DIAGNOSIS — J9600 Acute respiratory failure, unspecified whether with hypoxia or hypercapnia: Secondary | ICD-10-CM | POA: Diagnosis not present

## 2020-10-21 DIAGNOSIS — U071 COVID-19: Secondary | ICD-10-CM | POA: Diagnosis not present

## 2020-10-21 DIAGNOSIS — E1165 Type 2 diabetes mellitus with hyperglycemia: Secondary | ICD-10-CM | POA: Diagnosis not present

## 2020-10-21 LAB — COMPREHENSIVE METABOLIC PANEL
ALT: 37 U/L (ref 0–44)
AST: 28 U/L (ref 15–41)
Albumin: 2.8 g/dL — ABNORMAL LOW (ref 3.5–5.0)
Alkaline Phosphatase: 70 U/L (ref 38–126)
Anion gap: 11 (ref 5–15)
BUN: 19 mg/dL (ref 8–23)
CO2: 26 mmol/L (ref 22–32)
Calcium: 9.2 mg/dL (ref 8.9–10.3)
Chloride: 98 mmol/L (ref 98–111)
Creatinine, Ser: 0.89 mg/dL (ref 0.61–1.24)
GFR, Estimated: >60 mL/min (ref >60)
Glucose, Bld: 247 mg/dL — ABNORMAL HIGH (ref 70–99)
Potassium: 4.5 mmol/L (ref 3.5–5.1)
Sodium: 135 mmol/L (ref 135–145)
Total Bilirubin: 0.8 mg/dL (ref 0.3–1.2)
Total Protein: 6.1 g/dL — ABNORMAL LOW (ref 6.5–8.1)

## 2020-10-21 LAB — CBC WITH DIFFERENTIAL/PLATELET
Abs Immature Granulocytes: 0.39 10*3/uL — ABNORMAL HIGH (ref 0.00–0.07)
Basophils Absolute: 0 10*3/uL (ref 0.0–0.1)
Basophils Relative: 0 %
Eosinophils Absolute: 0 10*3/uL (ref 0.0–0.5)
Eosinophils Relative: 0 %
HCT: 43.2 % (ref 39.0–52.0)
Hemoglobin: 14.4 g/dL (ref 13.0–17.0)
Immature Granulocytes: 2 %
Lymphocytes Relative: 5 %
Lymphs Abs: 0.9 10*3/uL (ref 0.7–4.0)
MCH: 30.7 pg (ref 26.0–34.0)
MCHC: 33.3 g/dL (ref 30.0–36.0)
MCV: 92.1 fL (ref 80.0–100.0)
Monocytes Absolute: 0.6 10*3/uL (ref 0.1–1.0)
Monocytes Relative: 3 %
Neutro Abs: 16.1 10*3/uL — ABNORMAL HIGH (ref 1.7–7.7)
Neutrophils Relative %: 90 %
Platelets: 303 10*3/uL (ref 150–400)
RBC: 4.69 MIL/uL (ref 4.22–5.81)
RDW: 16.5 % — ABNORMAL HIGH (ref 11.5–15.5)
WBC: 18 10*3/uL — ABNORMAL HIGH (ref 4.0–10.5)
nRBC: 0 % (ref 0.0–0.2)

## 2020-10-21 LAB — D-DIMER, QUANTITATIVE: D-Dimer, Quant: 0.84 ug/mL-FEU — ABNORMAL HIGH (ref 0.00–0.50)

## 2020-10-21 LAB — GLUCOSE, CAPILLARY
Glucose-Capillary: 233 mg/dL — ABNORMAL HIGH (ref 70–99)
Glucose-Capillary: 369 mg/dL — ABNORMAL HIGH (ref 70–99)

## 2020-10-21 LAB — C-REACTIVE PROTEIN: CRP: 0.5 mg/dL (ref <1.0)

## 2020-10-21 MED ORDER — DEXAMETHASONE 6 MG PO TABS: 6.0000 mg | ORAL_TABLET | Freq: Every day | ORAL | 0 refills | Status: DC

## 2020-10-21 MED ORDER — PANTOPRAZOLE SODIUM 40 MG PO TBEC: 40.0000 mg | DELAYED_RELEASE_TABLET | Freq: Every day | ORAL | 0 refills | Status: DC

## 2020-10-21 MED ORDER — ACETAMINOPHEN 325 MG PO TABS: 650.0000 mg | ORAL_TABLET | Freq: Four times a day (QID) | ORAL | Status: AC | PRN

## 2020-10-21 NOTE — TOC Transition Note (Signed)
Transition of Care [facility]) - CM/SW Discharge Note   Patient Details  Name: Bill [name] MRN: [id number] Date of Birth: Apr 04, 1941  Transition of Care [facility]) CM/SW Contact:  Kermit Balo, RN Phone Number: 10/21/2020, 10:40 AM   Clinical Narrative:    Pt is discharging home with his fiance. Oxygen ordered through [employer]. Pt states he has a 3 in 1 at home.  HH arranged through [facility].  Pt has transport home.   Final next level of care: Home w Home Health Services Barriers to Discharge: No Barriers Identified   Patient Goals and CMS Choice   CMS Medicare.gov Compare Post Acute Care list provided to:: Patient Choice offered to / list presented to : Patient  Discharge Placement                       Discharge Plan and Services   Discharge Planning Services: CM Consult            DME Arranged: Oxygen DME Agency: [employer] Date DME Agency Contacted: 10/21/20   Representative spoke with at DME Agency: [employer] [employer] HH Arranged: PT,RN,OT HH Agency: [facility] Date [facility] Agency Contacted: 10/21/20   Representative spoke with at [facility] Agency: Kandee Keen  Social Determinants of Health (SDOH) Interventions     Readmission Risk Interventions No flowsheet data found.

## 2020-10-21 NOTE — Discharge Summary (Signed)
Bill [name], is a 79 y.o. male  DOB Sep 07, 1941  MRN [id number].  Admission date:  10/16/2020  Admitting Physician  Rise Patience, MD  Discharge Date:  10/21/2020   Primary MD  [name], MD  Recommendations for primary care physician for things to follow:  -Please check CBC, CMP during next visit.   Admission Diagnosis  Acute respiratory failure with hypoxia (HCC) [J96.01] COVID-19 virus infection [U07.1] Pneumonia due to COVID-19 virus [U07.1, J12.82] COVID-19 [U07.1]   Discharge Diagnosis  Acute respiratory failure with hypoxia ([place]) [J96.01] COVID-19 virus infection [U07.1] Pneumonia due to COVID-19 virus [U07.1, J12.82] COVID-19 [U07.1]    Principal Problem:   Acute respiratory failure due to COVID-19 [name]   CAD (coronary artery disease)   COPD (chronic obstructive pulmonary disease) with emphysema ([place])   Type 2 diabetes mellitus with hyperglycemia, without long-term current use of insulin (HCC)   Pneumonia due to COVID-19 virus   COVID-19 virus infection      Past Medical History:  Diagnosis Date  . Abnormal nuclear stress test 06/02/2019  . Anemia   . Anxiety   . CAD (coronary artery disease) 07/21/2016  . Cardiac arrhythmia   . COPD (chronic obstructive pulmonary disease) ([place])   . COPD (chronic obstructive pulmonary disease) with emphysema ([place]) 12/14/2017  . Frequent headaches 04/14/2018  . Helicobacter pylori gastritis 07/21/2016  . Penicillin allergy 07/21/2016  . Pneumonia   . Right groin pain 06/16/2019  . Skin cancer   . Type 2 diabetes mellitus with hyperglycemia, without long-term current use of insulin ([place]) 03/28/2019    Past Surgical History:  Procedure Laterality Date  . APPENDECTOMY    . back fusion    . cataract surgery Left   . CHOLECYSTECTOMY    . COLONOSCOPY  2014   clear   . LEFT HEART CATH AND CORONARY ANGIOGRAPHY N/A  06/09/2019   Procedure: LEFT HEART CATH AND CORONARY ANGIOGRAPHY;  Surgeon: Jettie Booze, MD;  Location: [place] [facility];  Service: Cardiovascular;  Laterality: N/A;  . SPINE SURGERY  1984   thoracic spine fusion        History of present illness and  Hospital Course:     Kindly see H&P for history of present illness and admission details, please review complete Labs, Consult reports and Test reports for all details in brief  HPI  from the history and physical done on the day of admission 10/17/2020   HPI: Bill [name] is a 80 y.o. male with history of diabetes mellitus type 2, COPD, nonobstructive CAD presents to the ER because of ongoing weakness with nausea vomiting and persistent cough for the last 1 week since being diagnosed with Covid infection on October 07, 2020.  Patient is not feeling any short of breath but feeling intensely weak and fatigued.  Denies chest pain fever or chills.  ED Course: In the ER patient blood pressure was in the low normal EKG shows normal sinus rhythm patient is afebrile but hypoxic  requiring 2 to 3 L to maintain sats.  CT angiogram of the chest is negative for PE but does show multifocal pneumonia.  Patient also was having low normal blood pressure for which patient was given normal saline bolus.  Labs show mild hypokalemia inflammatory markers are pending.  Covid test was positive.  Patient admitted for Covid infection with respiratory failure and dehydration.  Hospital Course   Acute respiratory failure with hypoxia secondary to Covid pneumonia  -Patient is unvaccinated  -Pain requiring 4 L nasal cannula initially, this has improved, he is tolerating room air at rest, and requiring 3 L nasal cannula with activity, oxygen will be arranged on discharge, he was treated with IV steroids, IV remdesivir, and baricitinib during hospital stay, he will be discharged on another 5 days of oral Decadron .  Dehydration:  -Resolved with IV  fluids  Diabetes mellitus type 2 with hyperglycemia -  has not been taking his Metformin for last few days due to persistent vomiting and poor appetite. He did -A1c is 7.9.  Hypertension -Home medications on discharge  COPD  -not actively wheezing continue home inhalers.  Nonobstructive CAD  -denies any chest pain EKG unremarkable  Discharge Condition:  stable   Follow UP   Follow-up Information    [name], MD Follow up in 2 week(s).   Specialty: Family Medicine Contact information: [address] [place] [address] [phone]                 Discharge Instructions  and  Discharge Medications    Discharge Instructions    Discharge instructions   Complete by: As directed    Follow with Primary MD [name], MD in 14 days   Get CBC, CMP, 2 view Chest X ray checked  by Primary MD next visit.    Activity: As tolerated with Full fall precautions use walker/cane & assistance as needed   Disposition Home    Diet: Heart Healthy /carb modified    On your next visit with your primary care physician please Get Medicines reviewed and adjusted.   Please request your Prim.MD to go over all Hospital Tests and Procedure/Radiological results at the follow up, please get all Hospital records sent to your Prim MD by signing hospital release before you go home.   If you experience worsening of your admission symptoms, develop shortness of breath, life threatening emergency, suicidal or homicidal thoughts you must seek medical attention immediately by calling 911 or calling your MD immediately  if symptoms less severe.  You Must read complete instructions/literature along with all the possible adverse reactions/side effects for all the Medicines you take and that have been prescribed to you. Take any new Medicines after you have completely understood and accpet all the possible adverse reactions/side effects.   Do not drive, operating heavy  machinery, perform activities at heights, swimming or participation in water activities or provide baby sitting services if your were admitted for syncope or siezures until you have seen by Primary MD or a Neurologist and advised to do so again.  Do not drive when taking Pain medications.    Do not take more than prescribed Pain, Sleep and Anxiety Medications  Special Instructions: If you have smoked or chewed Tobacco  in the last 2 yrs please stop smoking, stop any regular Alcohol  and or any Recreational drug use.  Wear Seat belts while driving.   Please note  You were cared for by a hospitalist during  your hospital stay. If you have any questions about your discharge medications or the care you received while you were in the hospital after you are discharged, you can call the unit and asked to speak with the hospitalist on call if the hospitalist that took care of you is not available. Once you are discharged, your primary care physician will handle any further medical issues. Please note that NO REFILLS for any discharge medications will be authorized once you are discharged, as it is imperative that you return to your primary care physician (or establish a relationship with a primary care physician if you do not have one) for your aftercare needs so that they can reassess your need for medications and monitor your lab values.   Increase activity slowly   Complete by: As directed      Allergies as of 10/21/2020      Reactions   Aspirin    Takes ASA 79m at home.   Oxycodone Nausea Only   PERCOCET- weakness, dizziness, shaking, nausea   Penicillins Hives   Did it involve swelling of the face/tongue/throat, SOB, or low BP? No Did it involve sudden or severe rash/hives, skin peeling, or any reaction on the inside of your mouth or nose? Yes Did you need to seek medical attention at a hospital or doctor's office? Yes When did it last happen?long time If all above answers are "NO",  may proceed with cephalosporin use.   Percocet [oxycodone-acetaminophen]    Ciprofloxacin Rash   Prednisone Palpitations      Medication List    STOP taking these medications   ondansetron 4 MG tablet Commonly known as: Zofran   predniSONE 20 MG tablet Commonly known as: DELTASONE     TAKE these medications   acetaminophen 325 MG tablet Commonly known as: TYLENOL Take 2 tablets (650 mg total) by mouth every 6 (six) hours as needed for mild pain (or Fever >/= 101).   albuterol 108 (90 Base) MCG/ACT inhaler Commonly known as: VENTOLIN HFA Inhale 2 puffs into the lungs every 6 (six) hours as needed for wheezing or shortness of breath.   aspirin 81 MG tablet Take 81 mg by mouth daily.   benzonatate 100 MG capsule Commonly known as: TESSALON Take 1 capsule (100 mg total) by mouth every 8 (eight) hours.   bisoprolol 5 MG tablet Commonly known as: ZEBETA Take 0.5 tablets (2.5 mg total) by mouth every Monday, Wednesday, and Friday.   CALCIUM CITRATE +D PO Take 1 tablet by mouth daily.   carboxymethylcellulose 0.5 % Soln Commonly known as: REFRESH PLUS Place 1 drop into both eyes daily as needed (dry eyes).   cyanocobalamin 1000 MCG tablet Take 1,000 mcg by mouth daily.   dexamethasone 6 MG tablet Commonly known as: DECADRON Take 1 tablet (6 mg total) by mouth daily. Start taking on: October 22, 2020   fexofenadine 180 MG tablet Commonly known as: ALLEGRA Take 1 tablet (180 mg total) by mouth daily.   gabapentin 100 MG capsule Commonly known as: Neurontin Take 1 capsule at bedtime for one week, then 2 capsules at bedtime.   metFORMIN 500 MG tablet Commonly known as: GLUCOPHAGE Take 1 tablet (500 mg total) by mouth daily with breakfast.   nitroGLYCERIN 0.4 MG SL tablet Commonly known as: NITROSTAT Place 1 tablet (0.4 mg total) under the tongue every 5 (five) minutes as needed for chest pain.   pantoprazole 40 MG tablet Commonly known as: PROTONIX Take 1  tablet (40 mg total)  by mouth daily. Start taking on: October 22, 2020   True Metrix Blood Glucose Test test strip Generic drug: glucose blood TEST UP TO FOUR TIMES DAILY AS DIRECTED   True Metrix Meter w/Device Kit USE AS DIRECTED   vitamin C 100 MG tablet Take 100 mg by mouth daily.            Durable Medical Equipment  (From admission, onward)         Start     Ordered   10/21/20 0711  For home use only DME oxygen  Once       Question Answer Comment  Length of Need 6 Months   Mode or (Route) Nasal cannula   Liters per Minute 2   Frequency Continuous (stationary and portable oxygen unit needed)   Oxygen conserving device Yes   Oxygen delivery system Gas      10/21/20 0710            Diet and Activity recommendation: See Discharge Instructions above   Consults obtained -  None   Major procedures and Radiology Reports - PLEASE review detailed and final reports for all details, in brief -    CT Angio Chest PE W and/or Wo Contrast  Result Date: 10/17/2020 CLINICAL DATA:  Shortness of breath, hypoxia EXAM: CT ANGIOGRAPHY CHEST WITH CONTRAST TECHNIQUE: Multidetector CT imaging of the chest was performed using the standard protocol during bolus administration of intravenous contrast. Multiplanar CT image reconstructions and MIPs were obtained to evaluate the vascular anatomy. CONTRAST:  66m OMNIPAQUE IOHEXOL 350 MG/ML SOLN COMPARISON:  CT 09/17/2009 FINDINGS: Cardiovascular: Satisfactory opacification the pulmonary arteries to the segmental level. No pulmonary artery filling defects are identified. Central pulmonary arteries are top-normal caliber. Borderline cardiomegaly. Three-vessel coronary artery calcification. No pericardial effusion. Suboptimal opacification of the thoracic aorta for luminal assessment. Atherosclerotic plaque within the normal caliber aorta. Normal 3 vessel branching of the aortic arch. Proximal great vessels are mildly calcified but  unremarkable. No major venous abnormalities. Mediastinum/Nodes: No mediastinal fluid or gas. Normal thyroid gland and thoracic inlet. No acute abnormality of the trachea. Small hiatal hernia. Fluid in the thoracic esophagus, could reflect some mild reflux. No esophageal thickening or inflammatory change. Scattered subcentimeter mediastinal and hilar nodes are favored to be reactive. No worrisome enlarged mediastinal, hilar or axillary adenopathy by size criteria. Lungs/Pleura: Diffuse heterogeneous geographic areas of ground-glass and consolidative opacity are present in both lungs distributed largely in the lung periphery. There is a background of paraseptal predominant emphysematous changes in both lungs. Diffuse airways thickening could reflect a combination of acute and chronic airways disease. No pneumothorax. No effusion. Stable 6 mm perifissural nodule along the right major fissure(11/72), 8 mm right middle lobe nodule (11/101), and 3 mm subpleural nodule in the posterior left lower lobe (11/102). 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 Prior cholecystectomy. Geographic focal fatty infiltration along the falciform ligament. Small accessory splenule. Upper abdominal atherosclerotic calcifications. Musculoskeletal: Multilevel degenerative changes are present in the imaged portions of the spine. Mild wedging and focal kyphosis at the thoracolumbar junction, possibly degenerative or remote though could correlate for point tenderness. Some dextrocurvature of the midthoracic spine is noted as well. No other acute or suspicious osseous abnormalities. Degenerative changes of the bilateral shoulders. No worrisome chest wall masses or lesions. Review of the MIP images confirms the above findings. IMPRESSION: 1. No evidence of acute pulmonary artery filling defects to suggest pulmonary embolism. 2. Diffuse heterogeneous geographic areas of ground-glass and  consolidative opacity in both lungs  distributed largely in the lung periphery, concerning for multifocal pneumonia including potential atypical viral etiologies such as COVID-19. 3. Mild wedging and focal kyphosis at the thoracolumbar junction, possibly degenerative or remote though could correlate for point tenderness to exclude acuity. 4. 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 5. Small hiatal hernia with fluid in the thoracic esophagus, correlate for symptoms of reflux. 6. Few stable small pulmonary nodules, almost certainly benign. 7. Aortic Atherosclerosis (ICD10-I70.0) 8. Emphysema (ICD10-J43.9). Electronically Signed   By: Lovena Le M.D.   On: 10/17/2020 02:36   DG Chest Portable 1 View  Result Date: 10/16/2020 CLINICAL DATA:  COVID-19 diagnosed 10/07/2020, weakness, loss of appetite EXAM: PORTABLE CHEST 1 VIEW COMPARISON:  06/02/2019 FINDINGS: Single frontal view of the chest was obtained, with the patient rotated toward the left. There is diffuse interstitial prominence, chronic. There is patchy consolidation at the left lung base which could reflect bronchopneumonia. No effusion or pneumothorax. IMPRESSION: 1. Patchy left lower lobe bronchopneumonia. 2. Chronic interstitial scarring. Electronically Signed   By: Randa Ngo M.D.   On: 10/16/2020 20:55    Micro Results    Recent Results (from the past 240 hour(s))  Blood Culture (routine x 2)     Status: None (Preliminary result)   Collection Time: 10/17/20  1:58 AM   Specimen: BLOOD  Result Value Ref Range Status   Specimen Description BLOOD SITE NOT SPECIFIED  Final   Special Requests   Final    BOTTLES DRAWN AEROBIC AND ANAEROBIC Blood Culture adequate volume   Culture   Final    NO GROWTH 4 DAYS Performed at [facility], [address] [place], [place] [address]    Report Status PENDING  Incomplete       Today   Subjective:   Bill [name] today has no headache,no chest abdominal pain,no new weakness tingling or numbness, feels much  better wants to go home today.  Objective:   Blood pressure 138/79, pulse 78, temperature 98.2 F (36.8 C), temperature source Oral, resp. rate 18, height _0  (1.676 m), weight 74.7 kg, SpO2 90 %.   Intake/Output Summary (Last 24 hours) at 10/21/2020 0946 Last data filed at 10/21/2020 0920 Gross per 24 hour  Intake 720 ml  Output --  Net 720 ml    Exam Awake Alert, Oriented x 3, No new F.N deficits, Normal affect Symmetrical Chest wall movement, Good air movement bilaterally, CTAB RRR,No Gallops,Rubs or new Murmurs, No Parasternal Heave +ve B.Sounds, Abd Soft, Non tender, No organomegaly appriciated, No rebound -guarding or rigidity. No Cyanosis, Clubbing or edema, No new Rash or bruise  Data Review   CBC w Diff:  Lab Results  Component Value Date   WBC 18.0 (H) 10/21/2020   HGB 14.4 10/21/2020   HGB 15.1 04/19/2020   HGB 14.2 06/02/2019   HCT 43.2 10/21/2020   HCT 42.0 06/02/2019   PLT 303 10/21/2020   PLT 308 04/19/2020   PLT 355 06/02/2019   LYMPHOPCT 5 10/21/2020   MONOPCT 3 10/21/2020   EOSPCT 0 10/21/2020   BASOPCT 0 10/21/2020    CMP:  Lab Results  Component Value Date   NA 135 10/21/2020   NA 139 01/10/2020   K 4.5 10/21/2020   CL 98 10/21/2020   CO2 26 10/21/2020   BUN 19 10/21/2020   BUN 17 01/10/2020   CREATININE 0.89 10/21/2020   CREATININE 0.98 04/19/2020   PROT 6.1 (L) 10/21/2020  PROT 6.7 01/10/2020   ALBUMIN 2.8 (L) 10/21/2020   ALBUMIN 4.1 01/10/2020   BILITOT 0.8 10/21/2020   BILITOT 0.5 04/19/2020   ALKPHOS 70 10/21/2020   AST 28 10/21/2020   AST 34 04/19/2020   ALT 37 10/21/2020   ALT 48 (H) 04/19/2020  .   Total Time in preparing paper work, data evaluation and todays exam - 47 minutes  Phillips Climes M.D on 10/21/2020 at 9:46 AM  [facility]   Office  [phone]

## 2020-10-21 NOTE — Plan of Care (Signed)
Discharge to home with Oxygen. Went  over discharge instructions, all questions ansered.

## 2020-10-21 NOTE — Progress Notes (Signed)
Inpatient Diabetes Program Recommendations  AACE/ADA: New Consensus Statement on Inpatient Glycemic Control (2015)  Target Ranges:  Prepandial:   less than 140 mg/dL      Peak postprandial:   less than 180 mg/dL (1-2 hours)      Critically ill patients:  140 - 180 mg/dL   Lab Results  Component Value Date   GLUCAP 369 (H) 10/21/2020   HGBA1C 7.9 (H) 10/18/2020    Review of Glycemic Control Results for [name], Bill S (MRN [id number]) as of 10/21/2020 12:56  Ref. Range 10/20/2020 08:01 10/20/2020 11:41 10/20/2020 17:08 10/20/2020 21:58 10/21/2020 07:57 10/21/2020 12:48  Glucose-Capillary Latest Ref Range: 70 - 99 mg/dL 025 (H) 427 (H) 062 (H) 236 (H) 233 (H) 369 (H)   Inpatient Diabetes Program Recommendations:   While on steroids: -Consider Novolog 4 units tid meal coverage if eats 50% Secure chat sent to Dr. Randol Kern.  Thank you, Billy Fischer. Symphany Fleissner, RN, MSN, CDE  Diabetes Coordinator Inpatient Glycemic Control Team Team Pager [phone] (8am-5pm) 10/21/2020 12:57 PM

## 2020-10-21 NOTE — Discharge Instructions (Signed)
Person Under Monitoring Name: Bill [name]  Location: [address] [place] [address]   Infection Prevention Recommendations for Individuals Confirmed to have, or Being Evaluated for, 2019 Novel Coronavirus (COVID-19) Infection Who Receive Care at Home  Individuals who are confirmed to have, or are being evaluated for, COVID-19 should follow the prevention steps below until a healthcare provider or local or state health department says they can return to normal activities.  Stay home except to get medical care You should restrict activities outside your home, except for getting medical care. Do not go to work, school, or public areas, and do not use public transportation or taxis.  Call ahead before visiting your doctor Before your medical appointment, call the healthcare provider and tell them that you have, or are being evaluated for, COVID-19 infection. This will help the healthcare provider's office take steps to keep other people from getting infected. Ask your healthcare provider to call the local or state health department.  Monitor your symptoms Seek prompt medical attention if your illness is worsening (e.g., difficulty breathing). Before going to your medical appointment, call the healthcare provider and tell them that you have, or are being evaluated for, COVID-19 infection. Ask your healthcare provider to call the local or state health department.  Wear a facemask You should wear a facemask that covers your nose and mouth when you are in the same room with other people and when you visit a healthcare provider. People who live with or visit you should also wear a facemask while they are in the same room with you.  Separate yourself from other people in your home As much as possible, you should stay in a different room from other people in your home. Also, you should use a separate bathroom, if available.  Avoid sharing household items You  should not share dishes, drinking glasses, cups, eating utensils, towels, bedding, or other items with other people in your home. After using these items, you should wash them thoroughly with soap and water.  Cover your coughs and sneezes Cover your mouth and nose with a tissue when you cough or sneeze, or you can cough or sneeze into your sleeve. Throw used tissues in a lined trash can, and immediately wash your hands with soap and water for at least 20 seconds or use an alcohol-based hand rub.  Wash your [employer] your hands often and thoroughly with soap and water for at least 20 seconds. You can use an alcohol-based hand sanitizer if soap and water are not available and if your hands are not visibly dirty. Avoid touching your eyes, nose, and mouth with unwashed hands.   Prevention Steps for Caregivers and Household Members of Individuals Confirmed to have, or Being Evaluated for, COVID-19 Infection Being Cared for in the Home  If you live with, or provide care at home for, a person confirmed to have, or being evaluated for, COVID-19 infection please follow these guidelines to prevent infection:  Follow healthcare provider's instructions Make sure that you understand and can help the patient follow any healthcare provider instructions for all care.  Provide for the patient's basic needs You should help the patient with basic needs in the home and provide support for getting groceries, prescriptions, and other personal needs.  Monitor the patient's symptoms If they are getting sicker, call his or her medical provider and tell them that the patient has, or is being evaluated for, COVID-19 infection. This will help the  healthcare provider's office take steps to keep other people from getting infected. Ask the healthcare provider to call the local or state health department.  Limit the number of people who have contact with the patient  If possible, have only one caregiver for the  patient.  Other household members should stay in another home or place of residence. If this is not possible, they should stay  in another room, or be separated from the patient as much as possible. Use a separate bathroom, if available.  Restrict visitors who do not have an essential need to be in the home.  Keep older adults, very young children, and other sick people away from the patient Keep older adults, very young children, and those who have compromised immune systems or chronic health conditions away from the patient. This includes people with chronic heart, lung, or kidney conditions, diabetes, and cancer.  Ensure good ventilation Make sure that shared spaces in the home have good air flow, such as from an air conditioner or an opened window, weather permitting.  Wash your hands often  Wash your hands often and thoroughly with soap and water for at least 20 seconds. You can use an alcohol based hand sanitizer if soap and water are not available and if your hands are not visibly dirty.  Avoid touching your eyes, nose, and mouth with unwashed hands.  Use disposable paper towels to dry your hands. If not available, use dedicated cloth towels and replace them when they become wet.  Wear a facemask and gloves  Wear a disposable facemask at all times in the room and gloves when you touch or have contact with the patient's blood, body fluids, and/or secretions or excretions, such as sweat, saliva, sputum, nasal mucus, vomit, urine, or feces.  Ensure the mask fits over your nose and mouth tightly, and do not touch it during use.  Throw out disposable facemasks and gloves after using them. Do not reuse.  Wash your hands immediately after removing your facemask and gloves.  If your personal clothing becomes contaminated, carefully remove clothing and launder. Wash your hands after handling contaminated clothing.  Place all used disposable facemasks, gloves, and other waste in a lined  container before disposing them with other household waste.  Remove gloves and wash your hands immediately after handling these items.  Do not share dishes, glasses, or other household items with the patient  Avoid sharing household items. You should not share dishes, drinking glasses, cups, eating utensils, towels, bedding, or other items with a patient who is confirmed to have, or being evaluated for, COVID-19 infection.  After the person uses these items, you should wash them thoroughly with soap and water.  Wash laundry thoroughly  Immediately remove and wash clothes or bedding that have blood, body fluids, and/or secretions or excretions, such as sweat, saliva, sputum, nasal mucus, vomit, urine, or feces, on them.  Wear gloves when handling laundry from the patient.  Read and follow directions on labels of laundry or clothing items and detergent. In general, wash and dry with the warmest temperatures recommended on the label.  Clean all areas the individual has used often  Clean all touchable surfaces, such as counters, tabletops, doorknobs, bathroom fixtures, toilets, phones, keyboards, tablets, and bedside tables, every day. Also, clean any surfaces that may have blood, body fluids, and/or secretions or excretions on them.  Wear gloves when cleaning surfaces the patient has come in contact with.  Use a diluted bleach solution (e.g., dilute bleach  with 1 part bleach and 10 parts water) or a household disinfectant with a label that says EPA-registered for coronaviruses. To make a bleach solution at home, add 1 tablespoon of bleach to 1 quart (4 cups) of water. For a larger supply, add  cup of bleach to 1 gallon (16 cups) of water.  Read labels of cleaning products and follow recommendations provided on product labels. Labels contain instructions for safe and effective use of the cleaning product including precautions you should take when applying the product, such as wearing gloves or  eye protection and making sure you have good ventilation during use of the product.  Remove gloves and wash hands immediately after cleaning.  Monitor yourself for signs and symptoms of illness Caregivers and household members are considered close contacts, should monitor their health, and will be asked to limit movement outside of the home to the extent possible. Follow the monitoring steps for close contacts listed on the symptom monitoring form.   ? If you have additional questions, contact your local health department or call the epidemiologist on call at [phone] (available 24/7). ? This guidance is subject to change. For the most up-to-date guidance from [facility], please refer to their website: [email or web address]

## 2020-10-21 NOTE — Care Management Important Message (Signed)
Important Message  Patient Details  Name: Bill [name] MRN: [id number] Date of Birth: August 07, 1941   Medicare Important Message Given:  Yes  Copy of the IM mailed to the patient address.    Jarmaine Ehrler 10/21/2020, 2:26 PM

## 2020-10-22 LAB — CULTURE, BLOOD (ROUTINE X 2)
Culture: NO GROWTH
Special Requests: ADEQUATE

## 2020-10-28 ENCOUNTER — Other Ambulatory Visit: Payer: Self-pay

## 2020-10-28 NOTE — Patient Outreach (Signed)
[facility]) Care Management  10/28/2020  Bill [name] May 04, 1941 [id number]   EMMI- General Discharge RED ON EMMI ALERT Day # 4 Date: 10/26/20 Red Alert Reason: Lost interest in things? yes  Outreach attempt: spoke with patient. He reports he is coming along.  He is disappointed that he is taking so long to get better. Discussed COVID and recovery.  He verbalized understanding. Patient denies problems with depression.  Patient reports home health involved and that the therapist is to visit today.  Urged patient to follow up with PCP.  Patient declines further questions or concerns at this time.     Plan: RN CM will close case.   Bary Leriche, RN, MSN [facility] Care Management Care Management Coordinator Direct Line [phone] Toll Free: [phone]  Fax: [phone]

## 2020-11-04 ENCOUNTER — Other Ambulatory Visit: Payer: Self-pay

## 2020-11-04 ENCOUNTER — Ambulatory Visit (INDEPENDENT_AMBULATORY_CARE_PROVIDER_SITE_OTHER): Payer: Medicare HMO | Admitting: Family Medicine

## 2020-11-04 ENCOUNTER — Encounter: Payer: Self-pay | Admitting: Family Medicine

## 2020-11-04 VITALS — BP 120/60 | HR 84 | Temp 98.9°F | Ht 66.0 in | Wt 154.4 lb

## 2020-11-04 DIAGNOSIS — E1165 Type 2 diabetes mellitus with hyperglycemia: Secondary | ICD-10-CM

## 2020-11-04 DIAGNOSIS — U071 COVID-19: Secondary | ICD-10-CM | POA: Diagnosis not present

## 2020-11-04 DIAGNOSIS — J1282 Pneumonia due to coronavirus disease 2019: Secondary | ICD-10-CM

## 2020-11-04 DIAGNOSIS — J439 Emphysema, unspecified: Secondary | ICD-10-CM

## 2020-11-04 MED ORDER — SITAGLIPTIN PHOSPHATE 50 MG PO TABS: 50.0000 mg | ORAL_TABLET | Freq: Every day | ORAL | 2 refills | Status: DC

## 2020-11-04 NOTE — Progress Notes (Signed)
   Subjective:    Patient ID: Bill [name], male    DOB: [birth year], 80 y.o.   MRN: [id number]  HPI Here to follow up a hospital stay from 10-16-20 to 10-21-20 for Covid pneumonia. On admission he had a cough, SOB, and weakness. A CT of the lungs showed bilateral infiltrates and he tested positive for the Covid virus. He was ob supplemental oxygen for about a week. He was given steroids and Remdesivir. He was sent home on Decadron, but he noticed his glucoses were shooting up high. On admission his A1c was steady at 7.9, but last week he was getting readings in the 300s and 400s . His highest glucose lats weekend was 570. He took himself off the Decadron 3 days ago. His taking his usual Metformin 500 mg once a day. He still weak but slowly getting his strength back. He is still mildly SOB, and he cannot walk very far without stopping to rest. He uses the Ventolin inhaler several times a day.    Review of Systems  Constitutional: Positive for fatigue.  Respiratory: Positive for cough and shortness of breath. Negative for wheezing.   Cardiovascular: Negative.   Gastrointestinal: Negative.   Genitourinary: Negative.   Neurological: Negative.        Objective:   Physical Exam Constitutional:      Appearance: Normal appearance.  Cardiovascular:     Rate and Rhythm: Normal rate and regular rhythm.     Pulses: Normal pulses.     Heart sounds: Normal heart sounds.  Pulmonary:     Effort: Pulmonary effort is normal. No respiratory distress.     Breath sounds: Normal breath sounds. No stridor. No wheezing, rhonchi or rales.  Neurological:     General: No focal deficit present.     Mental Status: He is alert and oriented to person, place, and time.           Assessment & Plan:  He is recovering from Covid pneumonia, and I told him it will take weeks to months to fully regain his strength. We will have Home Health evaluate him for PT. His diabetes is out of control and reassured him  this will settle down once the steroids are out of his system. We will add Januvia 50 mg daily to the Metformin. Recheck in 2 weeks.  Alysia Penna, MD

## 2020-11-05 NOTE — Addendum Note (Signed)
Addended by: Matilde Sprang on: 11/05/2020 09:51 AM   Modules accepted: Orders

## 2020-11-09 DIAGNOSIS — J1282 Pneumonia due to coronavirus disease 2019: Secondary | ICD-10-CM | POA: Diagnosis not present

## 2020-11-09 DIAGNOSIS — J9600 Acute respiratory failure, unspecified whether with hypoxia or hypercapnia: Secondary | ICD-10-CM | POA: Diagnosis not present

## 2020-11-09 DIAGNOSIS — Z79899 Other long term (current) drug therapy: Secondary | ICD-10-CM | POA: Diagnosis not present

## 2020-11-09 DIAGNOSIS — E119 Type 2 diabetes mellitus without complications: Secondary | ICD-10-CM | POA: Diagnosis not present

## 2020-11-09 DIAGNOSIS — R918 Other nonspecific abnormal finding of lung field: Secondary | ICD-10-CM | POA: Diagnosis not present

## 2020-11-09 DIAGNOSIS — J439 Emphysema, unspecified: Secondary | ICD-10-CM | POA: Diagnosis not present

## 2020-11-09 DIAGNOSIS — U071 COVID-19: Secondary | ICD-10-CM | POA: Diagnosis not present

## 2020-11-09 DIAGNOSIS — Z9981 Dependence on supplemental oxygen: Secondary | ICD-10-CM | POA: Diagnosis not present

## 2020-11-09 DIAGNOSIS — Z7984 Long term (current) use of oral hypoglycemic drugs: Secondary | ICD-10-CM | POA: Diagnosis not present

## 2020-11-13 ENCOUNTER — Other Ambulatory Visit: Payer: Self-pay

## 2020-11-13 ENCOUNTER — Inpatient Hospital Stay: Payer: Medicare HMO | Attending: Hematology and Oncology

## 2020-11-13 ENCOUNTER — Other Ambulatory Visit: Payer: Self-pay | Admitting: Hematology and Oncology

## 2020-11-13 ENCOUNTER — Inpatient Hospital Stay (HOSPITAL_BASED_OUTPATIENT_CLINIC_OR_DEPARTMENT_OTHER): Payer: Medicare HMO | Admitting: Hematology and Oncology

## 2020-11-13 VITALS — BP 138/75 | HR 71 | Temp 97.5°F | Resp 13 | Ht 66.0 in | Wt 159.7 lb

## 2020-11-13 DIAGNOSIS — Z8701 Personal history of pneumonia (recurrent): Secondary | ICD-10-CM | POA: Diagnosis not present

## 2020-11-13 DIAGNOSIS — Z8616 Personal history of COVID-19: Secondary | ICD-10-CM | POA: Diagnosis not present

## 2020-11-13 DIAGNOSIS — Z800 Family history of malignant neoplasm of digestive organs: Secondary | ICD-10-CM | POA: Insufficient documentation

## 2020-11-13 DIAGNOSIS — D72825 Bandemia: Secondary | ICD-10-CM

## 2020-11-13 DIAGNOSIS — Z7982 Long term (current) use of aspirin: Secondary | ICD-10-CM | POA: Insufficient documentation

## 2020-11-13 DIAGNOSIS — E119 Type 2 diabetes mellitus without complications: Secondary | ICD-10-CM | POA: Diagnosis not present

## 2020-11-13 DIAGNOSIS — K449 Diaphragmatic hernia without obstruction or gangrene: Secondary | ICD-10-CM | POA: Insufficient documentation

## 2020-11-13 DIAGNOSIS — D72829 Elevated white blood cell count, unspecified: Secondary | ICD-10-CM | POA: Insufficient documentation

## 2020-11-13 DIAGNOSIS — Z8042 Family history of malignant neoplasm of prostate: Secondary | ICD-10-CM | POA: Insufficient documentation

## 2020-11-13 DIAGNOSIS — Z87891 Personal history of nicotine dependence: Secondary | ICD-10-CM | POA: Insufficient documentation

## 2020-11-13 DIAGNOSIS — Z85828 Personal history of other malignant neoplasm of skin: Secondary | ICD-10-CM | POA: Diagnosis not present

## 2020-11-13 DIAGNOSIS — D471 Chronic myeloproliferative disease: Secondary | ICD-10-CM | POA: Diagnosis not present

## 2020-11-13 LAB — CBC WITH DIFFERENTIAL (CANCER CENTER ONLY)
Abs Immature Granulocytes: 0.23 10*3/uL — ABNORMAL HIGH (ref 0.00–0.07)
Basophils Absolute: 0.2 10*3/uL — ABNORMAL HIGH (ref 0.0–0.1)
Basophils Relative: 2 %
Eosinophils Absolute: 0.7 10*3/uL — ABNORMAL HIGH (ref 0.0–0.5)
Eosinophils Relative: 4 %
HCT: 40.1 % (ref 39.0–52.0)
Hemoglobin: 13.5 g/dL (ref 13.0–17.0)
Immature Granulocytes: 1 %
Lymphocytes Relative: 18 %
Lymphs Abs: 2.9 10*3/uL (ref 0.7–4.0)
MCH: 31.8 pg (ref 26.0–34.0)
MCHC: 33.7 g/dL (ref 30.0–36.0)
MCV: 94.4 fL (ref 80.0–100.0)
Monocytes Absolute: 0.8 10*3/uL (ref 0.1–1.0)
Monocytes Relative: 5 %
Neutro Abs: 11.2 10*3/uL — ABNORMAL HIGH (ref 1.7–7.7)
Neutrophils Relative %: 70 %
Platelet Count: 392 10*3/uL (ref 150–400)
RBC: 4.25 MIL/uL (ref 4.22–5.81)
RDW: 16.9 % — ABNORMAL HIGH (ref 11.5–15.5)
WBC Count: 16 10*3/uL — ABNORMAL HIGH (ref 4.0–10.5)
nRBC: 0 % (ref 0.0–0.2)

## 2020-11-13 LAB — CMP (CANCER CENTER ONLY)
ALT: 36 U/L (ref 0–44)
AST: 21 U/L (ref 15–41)
Albumin: 3 g/dL — ABNORMAL LOW (ref 3.5–5.0)
Alkaline Phosphatase: 119 U/L (ref 38–126)
Anion gap: 8 (ref 5–15)
BUN: 9 mg/dL (ref 8–23)
CO2: 26 mmol/L (ref 22–32)
Calcium: 8.9 mg/dL (ref 8.9–10.3)
Chloride: 104 mmol/L (ref 98–111)
Creatinine: 0.9 mg/dL (ref 0.61–1.24)
GFR, Estimated: >60 mL/min (ref >60)
Glucose, Bld: 258 mg/dL — ABNORMAL HIGH (ref 70–99)
Potassium: 3.7 mmol/L (ref 3.5–5.1)
Sodium: 138 mmol/L (ref 135–145)
Total Bilirubin: 0.4 mg/dL (ref 0.3–1.2)
Total Protein: 6.7 g/dL (ref 6.5–8.1)

## 2020-11-13 NOTE — Progress Notes (Signed)
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 Lorenso Courier. WBC 16.4, Hgb 15.6, MCV 96.7, Plt 294. MPN panel returns JAK2 positive 6) 11/13/2020: WBC 16.0, Hgb 13.5, MCV 94.4, Plt 392  Interval History:  Bill [name] 80 y.o. male with medical history significant for JAK2 positive leukocytosis who presents for a follow up visit. The patient's last visit was on 04/19/2020 at which time he established care. In the interim since the last visit he has been hospitalized from 12/22-12/27/21 with a COVID infection requiring supplemental oxygen.   On exam today Bill [name] reports he has partially recovered from his COVID infection.  He reports he is still having some weakness and some soreness in his ribs.  He also does have some residual shortness of breath.  He did lose taste, however fortunately it is mostly come back.  He also lost a considerable amount of weight during that process as he did not eat anything a week prior to admission.  He lost approximately 20 pounds of weight but appears to be gaining it back at this time.  He currently denies having any issues with fevers, chills, sweats, nausea, vomiting or diarrhea.  His weight has been stable and his appetite is been good.  A full 10 point ROS is listed below.  MEDICAL HISTORY:  Past Medical History:  Diagnosis Date  . Abnormal nuclear stress test 06/02/2019  . Anemia   . Anxiety   . CAD (coronary artery disease) 07/21/2016  . Cardiac arrhythmia   .  COPD (chronic obstructive pulmonary disease) ([place])   . COPD (chronic obstructive pulmonary disease) with emphysema ([place]) 12/14/2017  . Frequent headaches 04/14/2018  . Helicobacter pylori gastritis 07/21/2016  . Penicillin allergy 07/21/2016  . Pneumonia   . Right groin pain 06/16/2019  . Skin cancer   . Type 2 diabetes mellitus with hyperglycemia, without long-term current use of insulin ([place]) 03/28/2019    SURGICAL HISTORY: Past Surgical History:  Procedure Laterality Date  . APPENDECTOMY    . back fusion    . cataract surgery Left   . CHOLECYSTECTOMY    . COLONOSCOPY  2014   clear   . LEFT HEART CATH AND CORONARY ANGIOGRAPHY N/A 06/09/2019   Procedure: LEFT HEART CATH AND CORONARY ANGIOGRAPHY;  Surgeon: Jettie Booze, MD;  Location: [place] [facility];  Service: Cardiovascular;  Laterality: N/A;  . SPINE SURGERY  1984   thoracic spine fusion     SOCIAL HISTORY: Social History   Socioeconomic History  . Marital status: Significant Other    Spouse name: Not on file  . Number of children: 3  . Years of education: 7  . Highest education level: Not on file  Occupational History  . Occupation: retired    Fish farm manager: [employer].  Tobacco Use  . Smoking status: Former Research scientist (life sciences)  . Smokeless tobacco: Never Used  Vaping Use  . Vaping Use: Never used  Substance and Sexual Activity  . Alcohol use: No  . Drug use: No  .  Sexual activity: Not on file  Other Topics Concern  . Not on file  Social History Narrative   Lives with significant other in a 2 story home.  Has 3 children.  Retired.  Education: 7th grade.    Social Determinants of Health   Financial Resource Strain: Not on file  Food Insecurity: Not on file  Transportation Needs: No Transportation Needs  . Lack of Transportation (Medical): No  . Lack of Transportation (Non-Medical): No  Physical Activity: Not on file  Stress: Not on file  Social Connections: Not on file  Intimate Partner Violence: Not At  Risk  . Fear of Current or Ex-Partner: No  . Emotionally Abused: No  . Physically Abused: No  . Sexually Abused: No    FAMILY HISTORY: Family History  Problem Relation Age of Onset  . Colon cancer Mother   . Diabetes Father   . Heart disease Father   . Stroke Father   . Hypertension Father   . Hyperlipidemia Father   . Heart disease Brother   . Pancreatic cancer Cousin        x 2  . Prostate cancer Cousin   . Heart disease Son     ALLERGIES:  is allergic to aspirin, oxycodone, penicillins, percocet [oxycodone-acetaminophen], ciprofloxacin, and prednisone.  MEDICATIONS:  Current Outpatient Medications  Medication Sig Dispense Refill  . acetaminophen (TYLENOL) 325 MG tablet Take 2 tablets (650 mg total) by mouth every 6 (six) hours as needed for mild pain (or Fever >/= 101).    Marland Kitchen albuterol (VENTOLIN HFA) 108 (90 Base) MCG/ACT inhaler Inhale 2 puffs into the lungs every 6 (six) hours as needed for wheezing or shortness of breath. (Patient not taking: Reported on 11/13/2020) 8 g 2  . Ascorbic Acid (VITAMIN C) 100 MG tablet Take 100 mg by mouth daily.    Marland Kitchen aspirin 81 MG tablet Take 81 mg by mouth daily.    . benzonatate (TESSALON) 100 MG capsule Take 1 capsule (100 mg total) by mouth every 8 (eight) hours. (Patient not taking: Reported on 11/13/2020) 21 capsule 0  . bisoprolol (ZEBETA) 5 MG tablet Take 0.5 tablets (2.5 mg total) by mouth every Monday, Wednesday, and Friday. 45 tablet 1  . Blood Glucose Monitoring Suppl (TRUE METRIX METER) w/Device KIT USE AS DIRECTED 1 kit 0  . Calcium Citrate-Vitamin D (CALCIUM CITRATE +D PO) Take 1 tablet by mouth daily.     . carboxymethylcellulose (REFRESH PLUS) 0.5 % SOLN Place 1 drop into both eyes daily as needed (dry eyes).     . cyanocobalamin 1000 MCG tablet Take 1,000 mcg by mouth daily.     . fexofenadine (ALLEGRA) 180 MG tablet Take 1 tablet (180 mg total) by mouth daily. 90 tablet 2  . gabapentin (NEURONTIN) 100 MG capsule Take 1 capsule  at bedtime for one week, then 2 capsules at bedtime. (Patient not taking: Reported on 11/13/2020) 60 capsule 0  . metFORMIN (GLUCOPHAGE) 500 MG tablet Take 1 tablet (500 mg total) by mouth daily with breakfast. 90 tablet 2  . nitroGLYCERIN (NITROSTAT) 0.4 MG SL tablet Place 1 tablet (0.4 mg total) under the tongue every 5 (five) minutes as needed for chest pain. 25 tablet 3  . pantoprazole (PROTONIX) 40 MG tablet Take 1 tablet (40 mg total) by mouth daily. (Patient not taking: Reported on 11/13/2020) 15 tablet 0  . sitaGLIPtin (JANUVIA) 50 MG tablet Take 1 tablet (50 mg total) by mouth daily. (Patient not taking: Reported on 11/13/2020) 30  tablet 2  . TRUE METRIX BLOOD GLUCOSE TEST test strip TEST UP TO FOUR TIMES DAILY AS DIRECTED 200 strip 2   No current facility-administered medications for this visit.    REVIEW OF SYSTEMS:   Constitutional: ( - ) fevers, ( - )  chills , ( - ) night sweats Eyes: ( - ) blurriness of vision, ( - ) double vision, ( - ) watery eyes Ears, nose, mouth, throat, and face: ( - ) mucositis, ( - ) sore throat Respiratory: ( - ) cough, ( - ) dyspnea, ( - ) wheezes Cardiovascular: ( - ) palpitation, ( - ) chest discomfort, ( - ) lower extremity swelling Gastrointestinal:  ( - ) nausea, ( - ) heartburn, ( - ) change in bowel habits Skin: ( - ) abnormal skin rashes Lymphatics: ( - ) new lymphadenopathy, ( - ) easy bruising Neurological: ( - ) numbness, ( - ) tingling, ( - ) new weaknesses Behavioral/Psych: ( - ) mood change, ( - ) new changes  All other systems were reviewed with the patient and are negative.  PHYSICAL EXAMINATION: ECOG PERFORMANCE STATUS: 0 - Asymptomatic  Vitals:   11/13/20 1551  BP: 138/75  Pulse: 71  Resp: 13  Temp: (!) 97.5 F (36.4 C)  SpO2: 96%   Filed Weights   11/13/20 1551  Weight: 159 lb 11.2 oz (72.4 kg)    GENERAL: well appearing elderly Caucasian male. alert, no distress and comfortable SKIN: skin color, texture, turgor are  normal, no rashes or significant lesions EYES: conjunctiva are pink and non-injected, sclera clear LUNGS: clear to auscultation and percussion with normal breathing effort HEART: regular rate & rhythm and no murmurs and no lower extremity edema Musculoskeletal: no cyanosis of digits and no clubbing  PSYCH: alert & oriented x 3, fluent speech NEURO: no focal motor/sensory deficits  LABORATORY DATA:  I have reviewed the data as listed CBC Latest Ref Rng & Units 11/13/2020 10/21/2020 10/20/2020  WBC 4.0 - 10.5 K/uL 16.0(H) 18.0(H) 15.1(H)  Hemoglobin 13.0 - 17.0 g/dL 13.5 14.4 13.6  Hematocrit 39.0 - 52.0 % 40.1 43.2 38.8(L)  Platelets 150 - 400 K/uL 392 303 258    CMP Latest Ref Rng & Units 11/13/2020 10/21/2020 10/20/2020  Glucose 70 - 99 mg/dL 258(H) 247(H) 254(H)  BUN 8 - 23 mg/dL 9 19 17   Creatinine 0.61 - 1.24 mg/dL 0.90 0.89 0.78  Sodium 135 - 145 mmol/L 138 135 137  Potassium 3.5 - 5.1 mmol/L 3.7 4.5 4.0  Chloride 98 - 111 mmol/L 104 98 102  CO2 22 - 32 mmol/L 26 26 27   Calcium 8.9 - 10.3 mg/dL 8.9 9.2 8.4(L)  Total Protein 6.5 - 8.1 g/dL 6.7 6.1(L) 5.6(L)  Total Bilirubin 0.3 - 1.2 mg/dL 0.4 0.8 0.9  Alkaline Phos 38 - 126 U/L 119 70 63  AST 15 - 41 U/L 21 28 24   ALT 0 - 44 U/L 36 37 30    RADIOGRAPHIC STUDIES: CT Angio Chest PE W and/or Wo Contrast  Result Date: 10/17/2020 CLINICAL DATA:  Shortness of breath, hypoxia EXAM: CT ANGIOGRAPHY CHEST WITH CONTRAST TECHNIQUE: Multidetector CT imaging of the chest was performed using the standard protocol during bolus administration of intravenous contrast. Multiplanar CT image reconstructions and MIPs were obtained to evaluate the vascular anatomy. CONTRAST:  47m OMNIPAQUE IOHEXOL 350 MG/ML SOLN COMPARISON:  CT 09/17/2009 FINDINGS: Cardiovascular: Satisfactory opacification the pulmonary arteries to the segmental level. No pulmonary artery filling defects are identified. Central pulmonary  arteries are top-normal caliber. Borderline  cardiomegaly. Three-vessel coronary artery calcification. No pericardial effusion. Suboptimal opacification of the thoracic aorta for luminal assessment. Atherosclerotic plaque within the normal caliber aorta. Normal 3 vessel branching of the aortic arch. Proximal great vessels are mildly calcified but unremarkable. No major venous abnormalities. Mediastinum/Nodes: No mediastinal fluid or gas. Normal thyroid gland and thoracic inlet. No acute abnormality of the trachea. Small hiatal hernia. Fluid in the thoracic esophagus, could reflect some mild reflux. No esophageal thickening or inflammatory change. Scattered subcentimeter mediastinal and hilar nodes are favored to be reactive. No worrisome enlarged mediastinal, hilar or axillary adenopathy by size criteria. Lungs/Pleura: Diffuse heterogeneous geographic areas of ground-glass and consolidative opacity are present in both lungs distributed largely in the lung periphery. There is a background of paraseptal predominant emphysematous changes in both lungs. Diffuse airways thickening could reflect a combination of acute and chronic airways disease. No pneumothorax. No effusion. Stable 6 mm perifissural nodule along the right major fissure(11/72), 8 mm right middle lobe nodule (11/101), and 3 mm subpleural nodule in the posterior left lower lobe (11/102). 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 Prior cholecystectomy. Geographic focal fatty infiltration along the falciform ligament. Small accessory splenule. Upper abdominal atherosclerotic calcifications. Musculoskeletal: Multilevel degenerative changes are present in the imaged portions of the spine. Mild wedging and focal kyphosis at the thoracolumbar junction, possibly degenerative or remote though could correlate for point tenderness. Some dextrocurvature of the midthoracic spine is noted as well. No other acute or suspicious osseous abnormalities. Degenerative changes of the bilateral shoulders.  No worrisome chest wall masses or lesions. Review of the MIP images confirms the above findings. IMPRESSION: 1. No evidence of acute pulmonary artery filling defects to suggest pulmonary embolism. 2. Diffuse heterogeneous geographic areas of ground-glass and consolidative opacity in both lungs distributed largely in the lung periphery, concerning for multifocal pneumonia including potential atypical viral etiologies such as COVID-19. 3. Mild wedging and focal kyphosis at the thoracolumbar junction, possibly degenerative or remote though could correlate for point tenderness to exclude acuity. 4. 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 5. Small hiatal hernia with fluid in the thoracic esophagus, correlate for symptoms of reflux. 6. Few stable small pulmonary nodules, almost certainly benign. 7. Aortic Atherosclerosis (ICD10-I70.0) 8. Emphysema (ICD10-J43.9). Electronically Signed   By: Lovena Le M.D.   On: 10/17/2020 02:36   DG Chest Portable 1 View  Result Date: 10/16/2020 CLINICAL DATA:  COVID-19 diagnosed 10/07/2020, weakness, loss of appetite EXAM: PORTABLE CHEST 1 VIEW COMPARISON:  06/02/2019 FINDINGS: Single frontal view of the chest was obtained, with the patient rotated toward the left. There is diffuse interstitial prominence, chronic. There is patchy consolidation at the left lung base which could reflect bronchopneumonia. No effusion or pneumothorax. IMPRESSION: 1. Patchy left lower lobe bronchopneumonia. 2. Chronic interstitial scarring. Electronically Signed   By: Randa Ngo M.D.   On: 10/16/2020 20:55    ASSESSMENT & PLAN [employer]OT W[employer]WORTH 80 y.o. male with medical history significant for JAK2 positive leukocytosis who presents for a follow up visit.  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  The exact myeloproliferative neoplasm is not clear at  this time as the patient only has an elevation of white blood cell count with no elevation in his platelets or red blood cells.  Given this the patient does not require cytoreductive therapy  and can be managed on baby aspirin alone, which he is already taking for his cardiac disease.  Technically in order to confirm the diagnosis of an MPN a bone marrow biopsy is required.  In this case it would confirm the diagnosis, however it would not change our management.  As his counts are currently within normal limits he would not require hydroxyurea or other treatments in order to lower the counts.  As such the bone marrow biopsy will be predominantly academic.  Myelofibrosis is also a consideration, however given that there is no disease altering treatment we could wait until there was a drop in his blood counts before considering the bone marrow biopsy.  Overall I think we can continue with observation in order to assure that the patient does not convert into a leukemia or a myelofibrosis with cytopenias.  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 He has an MPN, but the exact one is not clear based off the peripheral blood finds.  --a bone marrow biopsy can be done to confirm a diagnosis, but with no elevated RBC or Plt counts we would not start cytoreductive therapy. The utility the bone marrow is for diagnosis only, it would not change the course of treatment --myelofibrosis is certainly on the differential, but as there is no treatment that alters the course of the disease it would be reasonable to hold until the patient began developing cytopenias before considering bone marrow biopsy. --continue ASA 24m PO daily (patient takes for cardiac disease) --RTC in 6 months for continued monitoring   No orders of the defined types were placed in this encounter.  All questions were answered. The patient knows to call the clinic with  any problems, questions or concerns.  A total of more than 30 minutes were spent on this encounter and over half of that time was spent on counseling and coordination of care as outlined above.   JLedell Peoples MD Department of Hematology/Oncology [place]at [facility] LPPhone: [phone]: [phone]: [email or web address]  11/13/2020 4:29 PM

## 2020-11-18 DIAGNOSIS — J9600 Acute respiratory failure, unspecified whether with hypoxia or hypercapnia: Secondary | ICD-10-CM | POA: Diagnosis not present

## 2020-11-18 DIAGNOSIS — Z9981 Dependence on supplemental oxygen: Secondary | ICD-10-CM | POA: Diagnosis not present

## 2020-11-18 DIAGNOSIS — J1282 Pneumonia due to coronavirus disease 2019: Secondary | ICD-10-CM | POA: Diagnosis not present

## 2020-11-18 DIAGNOSIS — E119 Type 2 diabetes mellitus without complications: Secondary | ICD-10-CM | POA: Diagnosis not present

## 2020-11-18 DIAGNOSIS — Z79899 Other long term (current) drug therapy: Secondary | ICD-10-CM | POA: Diagnosis not present

## 2020-11-18 DIAGNOSIS — J439 Emphysema, unspecified: Secondary | ICD-10-CM | POA: Diagnosis not present

## 2020-11-18 DIAGNOSIS — Z7984 Long term (current) use of oral hypoglycemic drugs: Secondary | ICD-10-CM | POA: Diagnosis not present

## 2020-11-18 DIAGNOSIS — U071 COVID-19: Secondary | ICD-10-CM | POA: Diagnosis not present

## 2020-11-18 DIAGNOSIS — R918 Other nonspecific abnormal finding of lung field: Secondary | ICD-10-CM | POA: Diagnosis not present

## 2020-11-21 DIAGNOSIS — R918 Other nonspecific abnormal finding of lung field: Secondary | ICD-10-CM | POA: Diagnosis not present

## 2020-11-21 DIAGNOSIS — J439 Emphysema, unspecified: Secondary | ICD-10-CM | POA: Diagnosis not present

## 2020-11-21 DIAGNOSIS — Z79899 Other long term (current) drug therapy: Secondary | ICD-10-CM | POA: Diagnosis not present

## 2020-11-21 DIAGNOSIS — E119 Type 2 diabetes mellitus without complications: Secondary | ICD-10-CM | POA: Diagnosis not present

## 2020-11-21 DIAGNOSIS — Z9981 Dependence on supplemental oxygen: Secondary | ICD-10-CM | POA: Diagnosis not present

## 2020-11-21 DIAGNOSIS — J9600 Acute respiratory failure, unspecified whether with hypoxia or hypercapnia: Secondary | ICD-10-CM | POA: Diagnosis not present

## 2020-11-21 DIAGNOSIS — U071 COVID-19: Secondary | ICD-10-CM | POA: Diagnosis not present

## 2020-11-21 DIAGNOSIS — Z7984 Long term (current) use of oral hypoglycemic drugs: Secondary | ICD-10-CM | POA: Diagnosis not present

## 2020-11-21 DIAGNOSIS — J1282 Pneumonia due to coronavirus disease 2019: Secondary | ICD-10-CM | POA: Diagnosis not present

## 2020-11-26 DIAGNOSIS — U071 COVID-19: Secondary | ICD-10-CM | POA: Diagnosis not present

## 2020-11-26 DIAGNOSIS — R918 Other nonspecific abnormal finding of lung field: Secondary | ICD-10-CM | POA: Diagnosis not present

## 2020-11-26 DIAGNOSIS — J439 Emphysema, unspecified: Secondary | ICD-10-CM | POA: Diagnosis not present

## 2020-11-26 DIAGNOSIS — Z79899 Other long term (current) drug therapy: Secondary | ICD-10-CM | POA: Diagnosis not present

## 2020-11-26 DIAGNOSIS — E119 Type 2 diabetes mellitus without complications: Secondary | ICD-10-CM | POA: Diagnosis not present

## 2020-11-26 DIAGNOSIS — J9600 Acute respiratory failure, unspecified whether with hypoxia or hypercapnia: Secondary | ICD-10-CM | POA: Diagnosis not present

## 2020-11-26 DIAGNOSIS — Z9981 Dependence on supplemental oxygen: Secondary | ICD-10-CM | POA: Diagnosis not present

## 2020-11-26 DIAGNOSIS — J1282 Pneumonia due to coronavirus disease 2019: Secondary | ICD-10-CM | POA: Diagnosis not present

## 2020-11-26 DIAGNOSIS — Z7984 Long term (current) use of oral hypoglycemic drugs: Secondary | ICD-10-CM | POA: Diagnosis not present

## 2020-11-27 DIAGNOSIS — U071 COVID-19: Secondary | ICD-10-CM | POA: Diagnosis not present

## 2020-11-27 DIAGNOSIS — J9600 Acute respiratory failure, unspecified whether with hypoxia or hypercapnia: Secondary | ICD-10-CM | POA: Diagnosis not present

## 2020-11-27 DIAGNOSIS — Z9181 History of falling: Secondary | ICD-10-CM

## 2020-11-27 DIAGNOSIS — Z7952 Long term (current) use of systemic steroids: Secondary | ICD-10-CM

## 2020-11-27 DIAGNOSIS — Z7984 Long term (current) use of oral hypoglycemic drugs: Secondary | ICD-10-CM | POA: Diagnosis not present

## 2020-11-27 DIAGNOSIS — J439 Emphysema, unspecified: Secondary | ICD-10-CM | POA: Diagnosis not present

## 2020-11-27 DIAGNOSIS — J1282 Pneumonia due to coronavirus disease 2019: Secondary | ICD-10-CM | POA: Diagnosis not present

## 2020-11-27 DIAGNOSIS — E119 Type 2 diabetes mellitus without complications: Secondary | ICD-10-CM | POA: Diagnosis not present

## 2020-11-27 DIAGNOSIS — Z79899 Other long term (current) drug therapy: Secondary | ICD-10-CM | POA: Diagnosis not present

## 2020-11-27 DIAGNOSIS — Z9981 Dependence on supplemental oxygen: Secondary | ICD-10-CM | POA: Diagnosis not present

## 2020-11-27 DIAGNOSIS — Z7982 Long term (current) use of aspirin: Secondary | ICD-10-CM

## 2020-11-27 DIAGNOSIS — R918 Other nonspecific abnormal finding of lung field: Secondary | ICD-10-CM | POA: Diagnosis not present

## 2020-11-28 DIAGNOSIS — J1282 Pneumonia due to coronavirus disease 2019: Secondary | ICD-10-CM | POA: Diagnosis not present

## 2020-11-28 DIAGNOSIS — Z79899 Other long term (current) drug therapy: Secondary | ICD-10-CM | POA: Diagnosis not present

## 2020-11-28 DIAGNOSIS — Z9981 Dependence on supplemental oxygen: Secondary | ICD-10-CM | POA: Diagnosis not present

## 2020-11-28 DIAGNOSIS — R918 Other nonspecific abnormal finding of lung field: Secondary | ICD-10-CM | POA: Diagnosis not present

## 2020-11-28 DIAGNOSIS — J9600 Acute respiratory failure, unspecified whether with hypoxia or hypercapnia: Secondary | ICD-10-CM | POA: Diagnosis not present

## 2020-11-28 DIAGNOSIS — Z7984 Long term (current) use of oral hypoglycemic drugs: Secondary | ICD-10-CM | POA: Diagnosis not present

## 2020-11-28 DIAGNOSIS — E119 Type 2 diabetes mellitus without complications: Secondary | ICD-10-CM | POA: Diagnosis not present

## 2020-11-28 DIAGNOSIS — J439 Emphysema, unspecified: Secondary | ICD-10-CM | POA: Diagnosis not present

## 2020-11-28 DIAGNOSIS — U071 COVID-19: Secondary | ICD-10-CM | POA: Diagnosis not present

## 2020-12-02 DIAGNOSIS — J439 Emphysema, unspecified: Secondary | ICD-10-CM | POA: Diagnosis not present

## 2020-12-02 DIAGNOSIS — Z7984 Long term (current) use of oral hypoglycemic drugs: Secondary | ICD-10-CM | POA: Diagnosis not present

## 2020-12-02 DIAGNOSIS — Z9981 Dependence on supplemental oxygen: Secondary | ICD-10-CM | POA: Diagnosis not present

## 2020-12-02 DIAGNOSIS — E119 Type 2 diabetes mellitus without complications: Secondary | ICD-10-CM | POA: Diagnosis not present

## 2020-12-02 DIAGNOSIS — J9600 Acute respiratory failure, unspecified whether with hypoxia or hypercapnia: Secondary | ICD-10-CM | POA: Diagnosis not present

## 2020-12-02 DIAGNOSIS — U071 COVID-19: Secondary | ICD-10-CM | POA: Diagnosis not present

## 2020-12-02 DIAGNOSIS — R918 Other nonspecific abnormal finding of lung field: Secondary | ICD-10-CM | POA: Diagnosis not present

## 2020-12-02 DIAGNOSIS — J1282 Pneumonia due to coronavirus disease 2019: Secondary | ICD-10-CM | POA: Diagnosis not present

## 2020-12-02 DIAGNOSIS — Z79899 Other long term (current) drug therapy: Secondary | ICD-10-CM | POA: Diagnosis not present

## 2020-12-10 ENCOUNTER — Ambulatory Visit (INDEPENDENT_AMBULATORY_CARE_PROVIDER_SITE_OTHER): Payer: Medicare HMO | Admitting: Family Medicine

## 2020-12-10 ENCOUNTER — Other Ambulatory Visit: Payer: Self-pay

## 2020-12-10 ENCOUNTER — Encounter: Payer: Self-pay | Admitting: Family Medicine

## 2020-12-10 VITALS — BP 130/82 | HR 72 | Temp 98.4°F | Ht 66.0 in | Wt 163.6 lb

## 2020-12-10 DIAGNOSIS — J9600 Acute respiratory failure, unspecified whether with hypoxia or hypercapnia: Secondary | ICD-10-CM

## 2020-12-10 DIAGNOSIS — J439 Emphysema, unspecified: Secondary | ICD-10-CM

## 2020-12-10 DIAGNOSIS — U071 COVID-19: Secondary | ICD-10-CM | POA: Diagnosis not present

## 2020-12-10 NOTE — Progress Notes (Signed)
   Subjective:    Patient ID: Bill [name], male    DOB: [birth year], 80 y.o.   MRN: [id number]  HPI Here to follow up on a Covid pneumonia he was diagnosed with last December.  Lung CT showed bilateral pneumonias and a background of emphysematous changes. A CXR in 2017 mentioned some COPD as well. He was a long time smoker and he worked for years as a Educational psychologist. Since his hospital stay he has been slowly improving as far as SOB and coughing, though he is still weak. He wears oxygen at night but not during the day. He has tried an albuterol inhaler but he says this does not help.    Review of Systems  Constitutional: Positive for fatigue.  Respiratory: Positive for cough and shortness of breath. Negative for choking and chest tightness.   Cardiovascular: Negative.        Objective:   Physical Exam Constitutional:      Appearance: Normal appearance. He is not ill-appearing.  Cardiovascular:     Rate and Rhythm: Normal rate and regular rhythm.     Pulses: Normal pulses.     Heart sounds: Normal heart sounds.  Pulmonary:     Effort: Pulmonary effort is normal. No respiratory distress.     Breath sounds: No wheezing, rhonchi or rales.     Comments: Faint bibasilar crackles  Musculoskeletal:     Right lower leg: No edema.     Left lower leg: No edema.  Neurological:     Mental Status: He is alert.           Assessment & Plan:  He has underlying COPD and he is recovering from a Covid pneumonia. We will refer him to Pulmonary for further evaluation.  Alysia Penna, MD

## 2020-12-13 DIAGNOSIS — M79672 Pain in left foot: Secondary | ICD-10-CM | POA: Diagnosis not present

## 2020-12-13 DIAGNOSIS — E119 Type 2 diabetes mellitus without complications: Secondary | ICD-10-CM | POA: Diagnosis not present

## 2020-12-13 DIAGNOSIS — B351 Tinea unguium: Secondary | ICD-10-CM | POA: Diagnosis not present

## 2020-12-13 DIAGNOSIS — L600 Ingrowing nail: Secondary | ICD-10-CM | POA: Diagnosis not present

## 2020-12-22 DIAGNOSIS — J9600 Acute respiratory failure, unspecified whether with hypoxia or hypercapnia: Secondary | ICD-10-CM | POA: Diagnosis not present

## 2020-12-22 DIAGNOSIS — J1282 Pneumonia due to coronavirus disease 2019: Secondary | ICD-10-CM | POA: Diagnosis not present

## 2020-12-22 DIAGNOSIS — U071 COVID-19: Secondary | ICD-10-CM | POA: Diagnosis not present

## 2020-12-26 ENCOUNTER — Other Ambulatory Visit: Payer: Self-pay | Admitting: Family Medicine

## 2021-01-02 ENCOUNTER — Other Ambulatory Visit: Payer: Self-pay | Admitting: Family Medicine

## 2021-01-06 ENCOUNTER — Other Ambulatory Visit: Payer: Self-pay

## 2021-01-06 DIAGNOSIS — J449 Chronic obstructive pulmonary disease, unspecified: Secondary | ICD-10-CM | POA: Insufficient documentation

## 2021-01-06 DIAGNOSIS — J189 Pneumonia, unspecified organism: Secondary | ICD-10-CM | POA: Insufficient documentation

## 2021-01-06 DIAGNOSIS — R519 Headache, unspecified: Secondary | ICD-10-CM

## 2021-01-06 DIAGNOSIS — C4490 Unspecified malignant neoplasm of skin, unspecified: Secondary | ICD-10-CM | POA: Insufficient documentation

## 2021-01-06 DIAGNOSIS — I499 Cardiac arrhythmia, unspecified: Secondary | ICD-10-CM | POA: Insufficient documentation

## 2021-01-06 DIAGNOSIS — F419 Anxiety disorder, unspecified: Secondary | ICD-10-CM | POA: Insufficient documentation

## 2021-01-06 DIAGNOSIS — D649 Anemia, unspecified: Secondary | ICD-10-CM | POA: Insufficient documentation

## 2021-01-06 HISTORY — DX: Headache, unspecified: R51.9

## 2021-01-07 ENCOUNTER — Encounter: Payer: Self-pay | Admitting: Cardiology

## 2021-01-07 ENCOUNTER — Ambulatory Visit: Payer: Medicare HMO | Admitting: Cardiology

## 2021-01-07 ENCOUNTER — Other Ambulatory Visit: Payer: Self-pay

## 2021-01-07 VITALS — BP 128/72 | HR 74 | Ht 67.0 in | Wt 164.0 lb

## 2021-01-07 DIAGNOSIS — I2510 Atherosclerotic heart disease of native coronary artery without angina pectoris: Secondary | ICD-10-CM | POA: Diagnosis not present

## 2021-01-07 DIAGNOSIS — E1165 Type 2 diabetes mellitus with hyperglycemia: Secondary | ICD-10-CM | POA: Diagnosis not present

## 2021-01-07 DIAGNOSIS — J431 Panlobular emphysema: Secondary | ICD-10-CM

## 2021-01-07 DIAGNOSIS — E782 Mixed hyperlipidemia: Secondary | ICD-10-CM | POA: Diagnosis not present

## 2021-01-07 NOTE — Progress Notes (Signed)
Cardiology Office Note:    Date:  01/07/2021   ID:  Bill [name], DOB [birth year], MRN [id number]  PCP:  Bill Morale, MD  Cardiologist:  Bill Lindau, MD   Referring MD: Bill Morale, MD    ASSESSMENT:    1. Coronary artery disease involving native coronary artery of native heart without angina pectoris   2. Panlobular emphysema ([place])   3. Type 2 diabetes mellitus with hyperglycemia, without long-term current use of insulin ([place])   4. Mixed dyslipidemia    PLAN:    In order of problems listed above:  1. Coronary artery disease: Nonobstructive in nature.  He had coronary angiography in 2020 and the results are mentioned below.  Secondary prevention stressed.  Importance of compliance with diet medication stressed any vocalized understanding. 2. Essential hypertension: Blood pressure stable and diet was emphasized. 3. Mixed dyslipidemia: Lipids were elevated in the past.  He is not keen on statin therapy because liver test elevation.  I respect his wishes.  He is going to do his best with diet. 4. COPD: Post Covid pneumonitis: Medical management.  He is due to see pulmonologist in the next few days. 5. Patient will be seen in follow-up appointment in 6 months or earlier if the patient has any concerns    Medication Adjustments/Labs and Tests Ordered: Current medicines are reviewed at length with the patient today.  Concerns regarding medicines are outlined above.  No orders of the defined types were placed in this encounter.  No orders of the defined types were placed in this encounter.    No chief complaint on file.    History of Present Illness:    Bill [name] is a 80 y.o. male.  Patient has past medical history of nonobstructive coronary artery disease, essential hypertension and dyslipidemia.  He denies any problems at this time and takes care of activities of daily living.  No chest pain orthopnea or PND.  He has had COVID-19 and he also has COPD.   He has dyspnea on exertion which is stable and he is due to see a pulmonologist in the next few days.  At the time of my evaluation, the patient is alert awake oriented and in no distress.  Past Medical History:  Diagnosis Date  . Abnormal nuclear stress test 06/02/2019  . Acute respiratory failure due to COVID-19 ([place]) 10/17/2020  . Anemia   . Angina pectoris ([place]) 06/02/2019   Formatting of this note might be different from the original. Normal cath 10/98, 3/16  . Anxiety   . Body mass index (BMI) 26.0-26.9, adult 02/16/2020  . CAD (coronary artery disease) 07/21/2016  . Cardiac arrhythmia   . Cervical spondylosis 02/16/2020  . Chronic nonintractable headache 09/02/2020  . COPD (chronic obstructive pulmonary disease) ([place])   . COPD (chronic obstructive pulmonary disease) with emphysema ([place]) 12/14/2017  . COVID-19 virus infection 10/17/2020  . Elevated blood-pressure reading, without diagnosis of hypertension 02/16/2020  . Elevated LFTs 01/12/2020  . Frequent headaches 04/14/2018  . Gastro-esophageal reflux disease with esophagitis 02/28/2014   Formatting of this note might be different from the original. Abnormal EGD 8/17  . Headache 01/06/2021  . Helicobacter pylori gastritis 07/21/2016  . Medicare annual wellness visit, initial 01/05/2017   Formatting of this note might be different from the original. 3/18  . Mixed dyslipidemia 01/12/2020  . Neck pain 02/16/2020  . Other chronic pain 09/02/2020  . Penicillin allergy 07/21/2016  . Pneumonia   . Pneumonia  due to COVID-19 virus 10/17/2020  . Pulmonary nodule 02/28/2014   Formatting of this note might be different from the original. RML, no change  . Right groin pain 06/16/2019  . Skin cancer   . Trigeminal neuralgia 07/26/2020  . Type 2 diabetes mellitus with hyperglycemia, without long-term current use of insulin ([place]) 03/28/2019    Past Surgical History:  Procedure Laterality Date  . APPENDECTOMY    . back fusion    . cataract surgery Left   .  CHOLECYSTECTOMY    . COLONOSCOPY  2014   clear   . LEFT HEART CATH AND CORONARY ANGIOGRAPHY N/A 06/09/2019   Procedure: LEFT HEART CATH AND CORONARY ANGIOGRAPHY;  Surgeon: Jettie Booze, MD;  Location: [facility];  Service: Cardiovascular;  Laterality: N/A;  . SPINE SURGERY  1984   thoracic spine fusion     Current Medications: Current Meds  Medication Sig  . acetaminophen (TYLENOL) 325 MG tablet Take 2 tablets (650 mg total) by mouth every 6 (six) hours as needed for mild pain (or Fever >/= 101).  . Ascorbic Acid (VITAMIN C) 100 MG tablet Take 100 mg by mouth daily.  Marland Kitchen aspirin 81 MG tablet Take 81 mg by mouth daily.  . bisoprolol (ZEBETA) 5 MG tablet Take 0.5 tablets (2.5 mg total) by mouth every Monday, Wednesday, and Friday.  . Calcium Citrate-Vitamin D (CALCIUM CITRATE +D PO) Take 1 tablet by mouth daily.   . carboxymethylcellulose (REFRESH PLUS) 0.5 % SOLN Place 1 drop into both eyes daily as needed (dry eyes).   . cyanocobalamin 1000 MCG tablet Take 1,000 mcg by mouth daily.   . fexofenadine (ALLEGRA) 180 MG tablet Take 1 tablet (180 mg total) by mouth daily.  . metFORMIN (GLUCOPHAGE) 500 MG tablet Take 1 tablet (500 mg total) by mouth daily with breakfast.  . nitroGLYCERIN (NITROSTAT) 0.4 MG SL tablet Place 1 tablet (0.4 mg total) under the tongue every 5 (five) minutes as needed for chest pain.  . Vitamin E 400 units TABS Take 400 Units by mouth daily.  . Zinc 100 MG TABS Take 100 mg by mouth daily.     Allergies:   Aspirin, Oxycodone, Penicillins, Percocet [oxycodone-acetaminophen], Ciprofloxacin, and Prednisone   Social History   Socioeconomic History  . Marital status: Significant Other    Spouse name: Not on file  . Number of children: 3  . Years of education: 7  . Highest education level: Not on file  Occupational History  . Occupation: retired    Fish farm manager: [employer].  Tobacco Use  . Smoking status: Former Research scientist (life sciences)  . Smokeless tobacco:  Never Used  Vaping Use  . Vaping Use: Never used  Substance and Sexual Activity  . Alcohol use: No  . Drug use: No  . Sexual activity: Not on file  Other Topics Concern  . Not on file  Social History Narrative   Lives with significant other in a 2 story home.  Has 3 children.  Retired.  Education: 7th grade.    Social Determinants of Health   Financial Resource Strain: Not on file  Food Insecurity: Not on file  Transportation Needs: No Transportation Needs  . Lack of Transportation (Medical): No  . Lack of Transportation (Non-Medical): No  Physical Activity: Not on file  Stress: Not on file  Social Connections: Not on file     Family History: The patient's family history includes Colon cancer in his mother; Diabetes in his father; Heart disease in  his brother, father, and son; Hyperlipidemia in his father; Hypertension in his father; Pancreatic cancer in his cousin; Prostate cancer in his cousin; Stroke in his father.  ROS:   Please see the history of present illness.    All other systems reviewed and are negative.  EKGs/Labs/Other Studies Reviewed:    The following studies were reviewed today: HEART CATH AND CORONARY ANGIOGRAPHY    Conclusion    Dist LM to Prox LAD lesion is 25% stenosed.  Mid LAD lesion is 25% stenosed.  The left ventricular systolic function is normal.  LV end diastolic pressure is normal.  The left ventricular ejection fraction is 55-65% by visual estimate.  There is no aortic valve stenosis.   Nonobstructive CAD.  Continue preventive therapy.     Recent Labs: 04/16/2020: TSH 1.84 11/13/2020: ALT 36; BUN 9; Creatinine 0.90; Hemoglobin 13.5; Platelet Count 392; Potassium 3.7; Sodium 138  Recent Lipid Panel    Component Value Date/Time   CHOL 182 04/16/2020 1451   CHOL 170 01/10/2020 1549   TRIG 63 10/17/2020 0158   HDL 39.70 04/16/2020 1451   HDL 42 01/10/2020 1549   CHOLHDL 5 04/16/2020 1451   VLDL 25.6 04/16/2020 1451    LDLCALC 117 (H) 04/16/2020 1451   LDLCALC 110 (H) 01/10/2020 1549    Physical Exam:    VS:  BP 128/72   Pulse 74   Ht 5\' 7"  (1.702 m)   Wt 164 lb (74.4 kg)   SpO2 94%   BMI 25.69 kg/m     Wt Readings from Last 3 Encounters:  01/07/21 164 lb (74.4 kg)  12/10/20 163 lb 9.6 oz (74.2 kg)  11/13/20 159 lb 11.2 oz (72.4 kg)     GEN: Patient is in no acute distress HEENT: Normal NECK: No JVD; No carotid bruits LYMPHATICS: No lymphadenopathy CARDIAC: Hear sounds regular, 2/6 systolic murmur at the apex. RESPIRATORY:  Clear to auscultation without rales, wheezing or rhonchi  ABDOMEN: Soft, non-tender, non-distended MUSCULOSKELETAL:  No edema; No deformity  SKIN: Warm and dry NEUROLOGIC:  Alert and oriented x 3 PSYCHIATRIC:  Normal affect   Signed, Bill Lindau, MD  01/07/2021 5:09 PM    [facility]

## 2021-01-07 NOTE — Patient Instructions (Signed)

## 2021-01-13 ENCOUNTER — Other Ambulatory Visit: Payer: Self-pay

## 2021-01-13 ENCOUNTER — Encounter: Payer: Self-pay | Admitting: Pulmonary Disease

## 2021-01-13 ENCOUNTER — Ambulatory Visit: Payer: Medicare HMO | Admitting: Pulmonary Disease

## 2021-01-13 VITALS — BP 126/80 | HR 66 | Temp 98.0°F | Ht 67.0 in | Wt 161.0 lb

## 2021-01-13 DIAGNOSIS — J1282 Pneumonia due to coronavirus disease 2019: Secondary | ICD-10-CM

## 2021-01-13 DIAGNOSIS — U071 COVID-19: Secondary | ICD-10-CM

## 2021-01-13 DIAGNOSIS — J449 Chronic obstructive pulmonary disease, unspecified: Secondary | ICD-10-CM | POA: Diagnosis not present

## 2021-01-13 MED ORDER — TRELEGY ELLIPTA 100-62.5-25 MCG/INH IN AEPB: 1.0000 | INHALATION_SPRAY | Freq: Every day | RESPIRATORY_TRACT | 0 refills | Status: DC

## 2021-01-13 NOTE — Progress Notes (Signed)
ct 

## 2021-01-13 NOTE — Progress Notes (Signed)
Bill [name]    [id number]    18-Sep-1941  Primary Care Physician:Fry, Ishmael Holter, MD  Referring Physician: [name], MD [address] [place],  [place] [address]  Chief complaint:   Patient in for evaluation for shortness of breath  HPI:  Treated for Covid in December 2021 Continues to feel short of breath  He was on an inhaler in the past for COPD  1 pack a day smoker quit in 2006  Was only hospitalized for couple of days Took him a few weeks to recover  He gets short of breath with mild to moderate exertion  Denies any chest pains He does have some burning sensation in his chest  Did try albuterol in the past, made his breathing feel worse Stated he was on an inhaler in the past that helped his breathing, this was pre having Covid  Worked in a foundry, worked with paint  Outpatient Encounter Medications as of 01/13/2021  Medication Sig  . acetaminophen (TYLENOL) 325 MG tablet Take 2 tablets (650 mg total) by mouth every 6 (six) hours as needed for mild pain (or Fever >/= 101).  . Ascorbic Acid (VITAMIN C) 100 MG tablet Take 100 mg by mouth daily.  Marland Kitchen aspirin 81 MG tablet Take 81 mg by mouth daily.  . bisoprolol (ZEBETA) 5 MG tablet Take 0.5 tablets (2.5 mg total) by mouth every Monday, Wednesday, and Friday.  . Calcium Citrate-Vitamin D (CALCIUM CITRATE +D PO) Take 1 tablet by mouth daily.   . carboxymethylcellulose (REFRESH PLUS) 0.5 % SOLN Place 1 drop into both eyes daily as needed (dry eyes).   . cyanocobalamin 1000 MCG tablet Take 1,000 mcg by mouth daily.   . fexofenadine (ALLEGRA) 180 MG tablet Take 1 tablet (180 mg total) by mouth daily.  . metFORMIN (GLUCOPHAGE) 500 MG tablet Take 1 tablet (500 mg total) by mouth daily with breakfast.  . nitroGLYCERIN (NITROSTAT) 0.4 MG SL tablet Place 1 tablet (0.4 mg total) under the tongue every 5 (five) minutes as needed for chest pain.  . Vitamin E 400 units TABS Take 400 Units by mouth daily.   . Zinc 100 MG TABS Take 100 mg by mouth daily.  . [DISCONTINUED] atorvastatin (LIPITOR) 10 MG tablet Take 1 tablet (10 mg total) by mouth daily. (Patient not taking: No sig reported)   No facility-administered encounter medications on file as of 01/13/2021.    Allergies as of 01/13/2021 - Review Complete 01/13/2021  Allergen Reaction Noted  . Aspirin  06/03/2016  . Oxycodone Nausea Only 02/17/2017  . Penicillins Hives 06/03/2016  . Percocet [oxycodone-acetaminophen]  06/03/2016  . Ciprofloxacin Rash 03/28/2019  . Prednisone Palpitations 06/03/2016    Past Medical History:  Diagnosis Date  . Abnormal nuclear stress test 06/02/2019  . Acute respiratory failure due to COVID-19 ([place]) 10/17/2020  . Anemia   . Angina pectoris ([place]) 06/02/2019   Formatting of this note might be different from the original. Normal cath 10/98, 3/16  . Anxiety   . Body mass index (BMI) 26.0-26.9, adult 02/16/2020  . CAD (coronary artery disease) 07/21/2016  . Cardiac arrhythmia   . Cervical spondylosis 02/16/2020  . Chronic nonintractable headache 09/02/2020  . COPD (chronic obstructive pulmonary disease) ([place])   . COPD (chronic obstructive pulmonary disease) with emphysema ([place]) 12/14/2017  . COVID-19 virus infection 10/17/2020  . Elevated blood-pressure reading, without diagnosis of hypertension 02/16/2020  . Elevated LFTs 01/12/2020  . Frequent headaches  04/14/2018  . Gastro-esophageal reflux disease with esophagitis 02/28/2014   Formatting of this note might be different from the original. Abnormal EGD 8/17  . Headache 01/06/2021  . Helicobacter pylori gastritis 07/21/2016  . Medicare annual wellness visit, initial 01/05/2017   Formatting of this note might be different from the original. 3/18  . Mixed dyslipidemia 01/12/2020  . Neck pain 02/16/2020  . Other chronic pain 09/02/2020  . Penicillin allergy 07/21/2016  . Pneumonia   . Pneumonia due to COVID-19 virus 10/17/2020  . Pulmonary nodule 02/28/2014    Formatting of this note might be different from the original. RML, no change  . Right groin pain 06/16/2019  . Skin cancer   . Trigeminal neuralgia 07/26/2020  . Type 2 diabetes mellitus with hyperglycemia, without long-term current use of insulin ([place]) 03/28/2019    Past Surgical History:  Procedure Laterality Date  . APPENDECTOMY    . back fusion    . cataract surgery Left   . CHOLECYSTECTOMY    . COLONOSCOPY  2014   clear   . LEFT HEART CATH AND CORONARY ANGIOGRAPHY N/A 06/09/2019   Procedure: LEFT HEART CATH AND CORONARY ANGIOGRAPHY;  Surgeon: Jettie Booze, MD;  Location: [facility];  Service: Cardiovascular;  Laterality: N/A;  . SPINE SURGERY  1984   thoracic spine fusion     Family History  Problem Relation Age of Onset  . Colon cancer Mother   . Diabetes Father   . Heart disease Father   . Stroke Father   . Hypertension Father   . Hyperlipidemia Father   . Heart disease Brother   . Pancreatic cancer Cousin        x 2  . Prostate cancer Cousin   . Heart disease Son     Social History   Socioeconomic History  . Marital status: Significant Other    Spouse name: Not on file  . Number of children: 3  . Years of education: 7  . Highest education level: Not on file  Occupational History  . Occupation: retired    Fish farm manager: [employer].  Tobacco Use  . Smoking status: Former Research scientist (life sciences)  . Smokeless tobacco: Never Used  Vaping Use  . Vaping Use: Never used  Substance and Sexual Activity  . Alcohol use: No  . Drug use: No  . Sexual activity: Not on file  Other Topics Concern  . Not on file  Social History Narrative   Lives with significant other in a 2 story home.  Has 3 children.  Retired.  Education: 7th grade.    Social Determinants of Health   Financial Resource Strain: Not on file  Food Insecurity: Not on file  Transportation Needs: No Transportation Needs  . Lack of Transportation (Medical): No  . Lack of Transportation  (Non-Medical): No  Physical Activity: Not on file  Stress: Not on file  Social Connections: Not on file  Intimate Partner Violence: Not At Risk  . Fear of Current or Ex-Partner: No  . Emotionally Abused: No  . Physically Abused: No  . Sexually Abused: No    Review of Systems  Constitutional: Positive for fatigue.  Respiratory: Positive for shortness of breath.   Psychiatric/Behavioral: Negative for sleep disturbance.  All other systems reviewed and are negative.   Vitals:   01/13/21 1133  BP: 126/80  Pulse: 66  Temp: 98 F (36.7 C)  SpO2: 98%     Physical Exam Constitutional:      Appearance:  Normal appearance.  HENT:     Head: Normocephalic.     Nose: No congestion.  Eyes:     General: No scleral icterus.       Right eye: No discharge.        Left eye: No discharge.     Pupils: Pupils are equal, round, and reactive to light.  Cardiovascular:     Rate and Rhythm: Normal rate and regular rhythm.     Heart sounds: No murmur heard.   Pulmonary:     Effort: Pulmonary effort is normal. No respiratory distress.     Breath sounds: No stridor. No wheezing or rales.  Musculoskeletal:     Cervical back: No rigidity.  Neurological:     Mental Status: He is alert.  Psychiatric:        Mood and Affect: Mood normal.    Data Reviewed: CT chest reviewed showing multifocal infiltrates -CT was reviewed with the patient  Assessment:  Background history of COPD/emphysema  Recent Covid infection  Chronic respiratory failure for which he uses 2 L-2 and a half liters of oxygen  Abnormal CT scan showing multifocal infiltrates    Plan/Recommendations: Prescription for Trelegy  Obtain PFT  Obtain CT scan of the chest to compare with previous  Importance of graded exercises discussed with patient  Importance of using inhalers on a regular basis to help shortness of breath discussed  Does not want to try rescue inhaler at present  Sherrilyn Rist MD [facility] 01/13/2021, 12:03 PM  CC: [name], MD

## 2021-01-13 NOTE — Patient Instructions (Signed)
We will get a breathing study -Can be done on the day of your next visit  CT scan of the chest -To compare the haziness with when you were being treated for Covid   Inhaler -Trelegy -Use once daily regardless of how you are feeling -Always make sure you rinse her mouth afterwards   Call with any significant concerns  Follow-up in 4 to 6 weeks  Exercise regularly

## 2021-01-21 ENCOUNTER — Ambulatory Visit (INDEPENDENT_AMBULATORY_CARE_PROVIDER_SITE_OTHER)
Admission: RE | Admit: 2021-01-21 | Discharge: 2021-01-21 | Disposition: A | Discharge status: Home / Self Care | Payer: Medicare HMO | Source: Ambulatory Visit | Attending: Pulmonary Disease | Admitting: Pulmonary Disease

## 2021-01-21 ENCOUNTER — Other Ambulatory Visit: Payer: Self-pay

## 2021-01-21 DIAGNOSIS — J1282 Pneumonia due to coronavirus disease 2019: Secondary | ICD-10-CM

## 2021-01-21 DIAGNOSIS — U071 COVID-19: Secondary | ICD-10-CM | POA: Diagnosis not present

## 2021-01-21 DIAGNOSIS — I700 Atherosclerosis of aorta: Secondary | ICD-10-CM | POA: Diagnosis not present

## 2021-01-21 DIAGNOSIS — I2510 Atherosclerotic heart disease of native coronary artery without angina pectoris: Secondary | ICD-10-CM | POA: Diagnosis not present

## 2021-01-21 DIAGNOSIS — Z8701 Personal history of pneumonia (recurrent): Secondary | ICD-10-CM | POA: Diagnosis not present

## 2021-01-21 DIAGNOSIS — J432 Centrilobular emphysema: Secondary | ICD-10-CM | POA: Diagnosis not present

## 2021-01-24 DIAGNOSIS — D2261 Melanocytic nevi of right upper limb, including shoulder: Secondary | ICD-10-CM | POA: Diagnosis not present

## 2021-01-24 DIAGNOSIS — D2262 Melanocytic nevi of left upper limb, including shoulder: Secondary | ICD-10-CM | POA: Diagnosis not present

## 2021-01-24 DIAGNOSIS — X32XXXA Exposure to sunlight, initial encounter: Secondary | ICD-10-CM | POA: Diagnosis not present

## 2021-01-24 DIAGNOSIS — L821 Other seborrheic keratosis: Secondary | ICD-10-CM | POA: Diagnosis not present

## 2021-01-24 DIAGNOSIS — L570 Actinic keratosis: Secondary | ICD-10-CM | POA: Diagnosis not present

## 2021-01-24 DIAGNOSIS — D225 Melanocytic nevi of trunk: Secondary | ICD-10-CM | POA: Diagnosis not present

## 2021-02-14 ENCOUNTER — Other Ambulatory Visit (HOSPITAL_COMMUNITY)
Admission: RE | Admit: 2021-02-14 | Discharge: 2021-02-14 | Disposition: A | Discharge status: Home / Self Care | Payer: Medicare HMO | Source: Ambulatory Visit | Attending: Pulmonary Disease | Admitting: Pulmonary Disease

## 2021-02-14 DIAGNOSIS — Z20822 Contact with and (suspected) exposure to covid-19: Secondary | ICD-10-CM | POA: Diagnosis not present

## 2021-02-14 DIAGNOSIS — Z01812 Encounter for preprocedural laboratory examination: Secondary | ICD-10-CM | POA: Diagnosis not present

## 2021-02-15 LAB — SARS CORONAVIRUS 2 (TAT 6-24 HRS): SARS Coronavirus 2: NEGATIVE

## 2021-02-17 ENCOUNTER — Ambulatory Visit (INDEPENDENT_AMBULATORY_CARE_PROVIDER_SITE_OTHER): Payer: Medicare HMO | Admitting: Pulmonary Disease

## 2021-02-17 ENCOUNTER — Ambulatory Visit: Payer: Medicare HMO | Admitting: Pulmonary Disease

## 2021-02-17 ENCOUNTER — Other Ambulatory Visit: Payer: Self-pay

## 2021-02-17 ENCOUNTER — Ambulatory Visit: Payer: Medicare HMO | Admitting: Neurology

## 2021-02-17 ENCOUNTER — Encounter: Payer: Self-pay | Admitting: Pulmonary Disease

## 2021-02-17 VITALS — BP 110/72 | HR 86 | Temp 98.5°F | Ht 67.0 in | Wt 152.0 lb

## 2021-02-17 DIAGNOSIS — J438 Other emphysema: Secondary | ICD-10-CM

## 2021-02-17 DIAGNOSIS — J449 Chronic obstructive pulmonary disease, unspecified: Secondary | ICD-10-CM | POA: Diagnosis not present

## 2021-02-17 DIAGNOSIS — R059 Cough, unspecified: Secondary | ICD-10-CM | POA: Diagnosis not present

## 2021-02-17 DIAGNOSIS — R9389 Abnormal findings on diagnostic imaging of other specified body structures: Secondary | ICD-10-CM | POA: Diagnosis not present

## 2021-02-17 LAB — PULMONARY FUNCTION TEST
DL/VA % pred: 111 %
DL/VA: 4.43 ml/min/mmHg/L
DLCO cor % pred: 84 %
DLCO cor: 18.63 ml/min/mmHg
DLCO unc % pred: 84 %
DLCO unc: 18.63 ml/min/mmHg
FEF 25-75 Post: 2.26 L/sec
FEF 25-75 Pre: 2.01 L/sec
FEF2575-%Change-Post: 12 %
FEF2575-%Pred-Post: 128 %
FEF2575-%Pred-Pre: 114 %
FEV1-%Change-Post: 4 %
FEV1-%Pred-Post: 89 %
FEV1-%Pred-Pre: 85 %
FEV1-Post: 2.26 L
FEV1-Pre: 2.16 L
FEV1FVC-%Change-Post: 2 %
FEV1FVC-%Pred-Pre: 109 %
FEV6-%Change-Post: 2 %
FEV6-%Pred-Post: 85 %
FEV6-%Pred-Pre: 82 %
FEV6-Post: 2.8 L
FEV6-Pre: 2.72 L
FEV6FVC-%Pred-Post: 107 %
FEV6FVC-%Pred-Pre: 107 %
FVC-%Change-Post: 1 %
FVC-%Pred-Post: 79 %
FVC-%Pred-Pre: 77 %
FVC-Post: 2.8 L
FVC-Pre: 2.75 L
Post FEV1/FVC ratio: 81 %
Post FEV6/FVC ratio: 100 %
Pre FEV1/FVC ratio: 79 %
Pre FEV6/FVC Ratio: 100 %
RV % pred: 117 %
RV: 2.86 L
TLC % pred: 89 %
TLC: 5.69 L

## 2021-02-17 MED ORDER — IPRATROPIUM-ALBUTEROL 20-100 MCG/ACT IN AERS: 1.0000 | INHALATION_SPRAY | Freq: Four times a day (QID) | RESPIRATORY_TRACT | 1 refills | Status: DC | PRN

## 2021-02-17 NOTE — Patient Instructions (Signed)
I did send in a prescription for Combivent to be used 1 puff up to 4 times a day as needed  Graded exercise as tolerated  Call with any significant symptoms  I will see you back in 4 months

## 2021-02-17 NOTE — Progress Notes (Signed)
PFT done today. 

## 2021-02-17 NOTE — Progress Notes (Signed)
Bill [name]    [id number]    Oct 31, 1940  Primary Care Physician:Fry, Ishmael Holter, MD  Referring Physician: [name], MD [address] [place],  [place] [address]  Chief complaint:   Patient in for evaluation for shortness of breath  HPI:  Treated for Covid in December 2021 Continues to feel short of breath  Trelegy did not help, made his symptoms worse Did give him some dysphonia  Occasional chest discomfort, minimal cough No fevers or chills  Pack-a-day smoker quit in 2006  Was only hospitalized for couple of days Took him a few weeks to recover  He gets short of breath with mild to moderate exertion  Denies any chest pains He does have some burning sensation in his chest  Did try albuterol in the past, made his breathing feel worse Stated he was on an inhaler in the past that helped his breathing, this was pre having Covid  Worked in a foundry, worked with paint  Outpatient Encounter Medications as of 02/17/2021  Medication Sig  . acetaminophen (TYLENOL) 325 MG tablet Take 2 tablets (650 mg total) by mouth every 6 (six) hours as needed for mild pain (or Fever >/= 101).  . Ascorbic Acid (VITAMIN C) 100 MG tablet Take 100 mg by mouth daily.  Marland Kitchen aspirin 81 MG tablet Take 81 mg by mouth daily.  . bisoprolol (ZEBETA) 5 MG tablet Take 0.5 tablets (2.5 mg total) by mouth every Monday, Wednesday, and Friday.  . Calcium Citrate-Vitamin D (CALCIUM CITRATE +D PO) Take 1 tablet by mouth daily.   . carboxymethylcellulose (REFRESH PLUS) 0.5 % SOLN Place 1 drop into both eyes daily as needed (dry eyes).   . cyanocobalamin 1000 MCG tablet Take 1,000 mcg by mouth daily.   . fexofenadine (ALLEGRA) 180 MG tablet Take 1 tablet (180 mg total) by mouth daily.  . metFORMIN (GLUCOPHAGE) 500 MG tablet Take 1 tablet (500 mg total) by mouth daily with breakfast.  . nitroGLYCERIN (NITROSTAT) 0.4 MG SL tablet Place 1 tablet (0.4 mg total) under the tongue every 5  (five) minutes as needed for chest pain.  . Vitamin E 400 units TABS Take 400 Units by mouth daily.  . Zinc 100 MG TABS Take 100 mg by mouth daily.  . Fluticasone-Umeclidin-Vilant (TRELEGY ELLIPTA) 100-62.5-25 MCG/INH AEPB Inhale 1 puff into the lungs daily. (Patient not taking: Reported on 02/17/2021)  . [DISCONTINUED] atorvastatin (LIPITOR) 10 MG tablet Take 1 tablet (10 mg total) by mouth daily. (Patient not taking: No sig reported)   No facility-administered encounter medications on file as of 02/17/2021.    Allergies as of 02/17/2021 - Review Complete 02/17/2021  Allergen Reaction Noted  . Aspirin  06/03/2016  . Oxycodone Nausea Only 02/17/2017  . Penicillins Hives 06/03/2016  . Percocet [oxycodone-acetaminophen]  06/03/2016  . Ciprofloxacin Rash 03/28/2019  . Prednisone Palpitations 06/03/2016    Past Medical History:  Diagnosis Date  . Abnormal nuclear stress test 06/02/2019  . Acute respiratory failure due to COVID-19 ([facility]) 10/17/2020  . Anemia   . Angina pectoris ([place]) 06/02/2019   Formatting of this note might be different from the original. Normal cath 10/98, 3/16  . Anxiety   . Body mass index (BMI) 26.0-26.9, adult 02/16/2020  . CAD (coronary artery disease) 07/21/2016  . Cardiac arrhythmia   . Cervical spondylosis 02/16/2020  . Chronic nonintractable headache 09/02/2020  . COPD (chronic obstructive pulmonary disease) ([place])   . COPD (chronic obstructive  pulmonary disease) with emphysema ([place]) 12/14/2017  . COVID-19 virus infection 10/17/2020  . Elevated blood-pressure reading, without diagnosis of hypertension 02/16/2020  . Elevated LFTs 01/12/2020  . Frequent headaches 04/14/2018  . Gastro-esophageal reflux disease with esophagitis 02/28/2014   Formatting of this note might be different from the original. Abnormal EGD 8/17  . Headache 01/06/2021  . Helicobacter pylori gastritis 07/21/2016  . Medicare annual wellness visit, initial 01/05/2017   Formatting of this note might be  different from the original. 3/18  . Mixed dyslipidemia 01/12/2020  . Neck pain 02/16/2020  . Other chronic pain 09/02/2020  . Penicillin allergy 07/21/2016  . Pneumonia   . Pneumonia due to COVID-19 virus 10/17/2020  . Pulmonary nodule 02/28/2014   Formatting of this note might be different from the original. RML, no change  . Right groin pain 06/16/2019  . Skin cancer   . Trigeminal neuralgia 07/26/2020  . Type 2 diabetes mellitus with hyperglycemia, without long-term current use of insulin ([place]) 03/28/2019    Past Surgical History:  Procedure Laterality Date  . APPENDECTOMY    . back fusion    . cataract surgery Left   . CHOLECYSTECTOMY    . COLONOSCOPY  2014   clear   . LEFT HEART CATH AND CORONARY ANGIOGRAPHY N/A 06/09/2019   Procedure: LEFT HEART CATH AND CORONARY ANGIOGRAPHY;  Surgeon: Jettie Booze, MD;  Location: [facility];  Service: Cardiovascular;  Laterality: N/A;  . SPINE SURGERY  1984   thoracic spine fusion     Family History  Problem Relation Age of Onset  . Colon cancer Mother   . Diabetes Father   . Heart disease Father   . Stroke Father   . Hypertension Father   . Hyperlipidemia Father   . Heart disease Brother   . Pancreatic cancer Cousin        x 2  . Prostate cancer Cousin   . Heart disease Son     Social History   Socioeconomic History  . Marital status: Significant Other    Spouse name: Not on file  . Number of children: 3  . Years of education: 7  . Highest education level: Not on file  Occupational History  . Occupation: retired    Fish farm manager: [employer].  Tobacco Use  . Smoking status: Former Smoker    Packs/day: 1.50    Years: 64.00    Pack years: 96.00    Quit date: 2007    Years since quitting: 15.3  . Smokeless tobacco: Never Used  Vaping Use  . Vaping Use: Never used  Substance and Sexual Activity  . Alcohol use: No  . Drug use: No  . Sexual activity: Not on file  Other Topics Concern  . Not on file   Social History Narrative   Lives with significant other in a 2 story home.  Has 3 children.  Retired.  Education: 7th grade.    Social Determinants of Health   Financial Resource Strain: Not on file  Food Insecurity: Not on file  Transportation Needs: No Transportation Needs  . Lack of Transportation (Medical): No  . Lack of Transportation (Non-Medical): No  Physical Activity: Not on file  Stress: Not on file  Social Connections: Not on file  Intimate Partner Violence: Not At Risk  . Fear of Current or Ex-Partner: No  . Emotionally Abused: No  . Physically Abused: No  . Sexually Abused: No    Review of Systems  Constitutional:  Positive for fatigue.  Respiratory: Positive for shortness of breath.   Psychiatric/Behavioral: Negative for sleep disturbance.  All other systems reviewed and are negative.   Vitals:   02/17/21 1412  BP: 110/72  Pulse: 86  Temp: 98.5 F (36.9 C)  SpO2: 96%     Physical Exam Constitutional:      Appearance: Normal appearance.  HENT:     Head: Normocephalic.     Nose: No congestion.     Mouth/Throat:     Mouth: Mucous membranes are moist.  Eyes:     General: No scleral icterus.       Right eye: No discharge.        Left eye: No discharge.     Pupils: Pupils are equal, round, and reactive to light.  Cardiovascular:     Rate and Rhythm: Normal rate and regular rhythm.     Heart sounds: No murmur heard.   Pulmonary:     Effort: Pulmonary effort is normal. No respiratory distress.     Breath sounds: No stridor. No wheezing or rales.  Musculoskeletal:     Cervical back: No rigidity.  Neurological:     Mental Status: He is alert.  Psychiatric:        Mood and Affect: Mood normal.    Data Reviewed: CT chest reviewed showing multifocal infiltrates -CT was reviewed with the patient  Repeat CT scan of the chest is stable 01/21/2021 compared with 10/17/2020 -CT significant for postinflammatory fibrosis  Pulmonary function test  reviewed showing mild obstructive disease with no significant bronchodilator response  Assessment:  Background history of COPD/emphysema -Unchanged -Did not tolerate Trelegy  Recent Covid infection -Still with some residual symptoms but overall improving  Chronic respiratory failure for which he uses 2 L-2 and a half liters of oxygen  Abnormal CT scan showing multifocal infiltrates -CT scan is stable from previous showing postinflammatory fibrosis   Plan/Recommendations: Switch to Combivent Respimat, 1 puff 4 times daily as needed  Graded exercise as tolerated  Importance of using inhalers on a regular basis to help shortness of breath discussed  Did not tolerate Trelegy and this was switched to Combivent  I spent 30 minutes dedicated to the care of this patient on the date of this encounter to include previsit review of records, face-to-face time with the patient discussing conditions above, post visit ordering of testing, clinical documentation with electronic health record and communicated necessary findings to members of the patient's care team  Sherrilyn Rist MD [facility] 02/17/2021, 2:18 PM  CC: [name], MD

## 2021-02-19 ENCOUNTER — Other Ambulatory Visit: Payer: Self-pay

## 2021-02-19 ENCOUNTER — Ambulatory Visit (INDEPENDENT_AMBULATORY_CARE_PROVIDER_SITE_OTHER): Payer: Medicare HMO

## 2021-02-19 VITALS — BP 120/70 | HR 74 | Temp 98.0°F | Ht 67.0 in | Wt 154.0 lb

## 2021-02-19 DIAGNOSIS — Z0000 Encounter for general adult medical examination without abnormal findings: Secondary | ICD-10-CM

## 2021-02-19 NOTE — Patient Instructions (Signed)
Bill [name] , Thank you for taking time to come for your Medicare Wellness Visit. I appreciate your ongoing commitment to your health goals. Please review the following plan we discussed and let me know if I can assist you in the future.   Screening recommendations/referrals: Colonoscopy: no longer needed Recommended yearly ophthalmology/optometry visit for glaucoma screening and checkup Recommended yearly dental visit for hygiene and checkup  Vaccinations: Influenza vaccine: current due fall 2022 Pneumococcal vaccine: completed series  Tdap vaccine: current  Shingles vaccine: will obtain  local pharmacy     Advanced directives: will provide copies   Conditions/risks identified: diabetic compliance   Next appointment: none   Preventive Care 7 Years and Older, Male Preventive care refers to lifestyle choices and visits with your health care provider that can promote health and wellness. What does preventive care include?  A yearly physical exam. This is also called an annual well check.  Dental exams once or twice a year.  Routine eye exams. Ask your health care provider how often you should have your eyes checked.  Personal lifestyle choices, including:  Daily care of your teeth and gums.  Regular physical activity.  Eating a healthy diet.  Avoiding tobacco and drug use.  Limiting alcohol use.  Practicing safe sex.  Taking low doses of aspirin every day.  Taking vitamin and mineral supplements as recommended by your health care provider. What happens during an annual well check? The services and screenings done by your health care provider during your annual well check will depend on your age, overall health, lifestyle risk factors, and family history of disease. Counseling  Your health care provider may ask you questions about your:  Alcohol use.  Tobacco use.  Drug use.  Emotional well-being.  Home and relationship well-being.  Sexual  activity.  Eating habits.  History of falls.  Memory and ability to understand (cognition).  Work and work Statistician. Screening  You may have the following tests or measurements:  Height, weight, and BMI.  Blood pressure.  Lipid and cholesterol levels. These may be checked every 5 years, or more frequently if you are over 48 years old.  Skin check.  Lung cancer screening. You may have this screening every year starting at age 23 if you have a 30-pack-year history of smoking and currently smoke or have quit within the past 15 years.  Fecal occult blood test (FOBT) of the stool. You may have this test every year starting at age 56.  Flexible sigmoidoscopy or colonoscopy. You may have a sigmoidoscopy every 5 years or a colonoscopy every 10 years starting at age 32.  Prostate cancer screening. Recommendations will vary depending on your family history and other risks.  Hepatitis C blood test.  Hepatitis B blood test.  Sexually transmitted disease (STD) testing.  Diabetes screening. This is done by checking your blood sugar (glucose) after you have not eaten for a while (fasting). You may have this done every 1-3 years.  Abdominal aortic aneurysm (AAA) screening. You may need this if you are a current or former smoker.  Osteoporosis. You may be screened starting at age 107 if you are at high risk. Talk with your health care provider about your test results, treatment options, and if necessary, the need for more tests. Vaccines  Your health care provider may recommend certain vaccines, such as:  Influenza vaccine. This is recommended every year.  Tetanus, diphtheria, and acellular pertussis (Tdap, Td) vaccine. You may need a Td booster every 10  years.  Zoster vaccine. You may need this after age 92.  Pneumococcal 13-valent conjugate (PCV13) vaccine. One dose is recommended after age 61.  Pneumococcal polysaccharide (PPSV23) vaccine. One dose is recommended after age  72. Talk to your health care provider about which screenings and vaccines you need and how often you need them. This information is not intended to replace advice given to you by your health care provider. Make sure you discuss any questions you have with your health care provider. Document Released: 11/08/2015 Document Revised: 07/01/2016 Document Reviewed: 08/13/2015 Elsevier Interactive Patient Education  2017 [place] Prevention in the Home Falls can cause injuries. They can happen to people of all ages. There are many things you can do to make your home safe and to help prevent falls. What can I do on the outside of my home?  Regularly fix the edges of walkways and driveways and fix any cracks.  Remove anything that might make you trip as you walk through a door, such as a raised step or threshold.  Trim any bushes or trees on the path to your home.  Use bright outdoor lighting.  Clear any walking paths of anything that might make someone trip, such as rocks or tools.  Regularly check to see if handrails are loose or broken. Make sure that both sides of any steps have handrails.  Any raised decks and porches should have guardrails on the edges.  Have any leaves, snow, or ice cleared regularly.  Use sand or salt on walking paths during winter.  Clean up any spills in your garage right away. This includes oil or grease spills. What can I do in the bathroom?  Use night lights.  Install grab bars by the toilet and in the tub and shower. Do not use towel bars as grab bars.  Use non-skid mats or decals in the tub or shower.  If you need to sit down in the shower, use a plastic, non-slip stool.  Keep the floor dry. Clean up any water that spills on the floor as soon as it happens.  Remove soap buildup in the tub or shower regularly.  Attach bath mats securely with double-sided non-slip rug tape.  Do not have throw rugs and other things on the floor that can make  you trip. What can I do in the bedroom?  Use night lights.  Make sure that you have a light by your bed that is easy to reach.  Do not use any sheets or blankets that are too big for your bed. They should not hang down onto the floor.  Have a firm chair that has side arms. You can use this for support while you get dressed.  Do not have throw rugs and other things on the floor that can make you trip. What can I do in the kitchen?  Clean up any spills right away.  Avoid walking on wet floors.  Keep items that you use a lot in easy-to-reach places.  If you need to reach something above you, use a strong step stool that has a grab bar.  Keep electrical cords out of the way.  Do not use floor polish or wax that makes floors slippery. If you must use wax, use non-skid floor wax.  Do not have throw rugs and other things on the floor that can make you trip. What can I do with my stairs?  Do not leave any items on the stairs.  Make sure that  there are handrails on both sides of the stairs and use them. Fix handrails that are broken or loose. Make sure that handrails are as long as the stairways.  Check any carpeting to make sure that it is firmly attached to the stairs. Fix any carpet that is loose or worn.  Avoid having throw rugs at the top or bottom of the stairs. If you do have throw rugs, attach them to the floor with carpet tape.  Make sure that you have a light switch at the top of the stairs and the bottom of the stairs. If you do not have them, ask someone to add them for you. What else can I do to help prevent falls?  Wear shoes that:  Do not have high heels.  Have rubber bottoms.  Are comfortable and fit you well.  Are closed at the toe. Do not wear sandals.  If you use a stepladder:  Make sure that it is fully opened. Do not climb a closed stepladder.  Make sure that both sides of the stepladder are locked into place.  Ask someone to hold it for you, if  possible.  Clearly mark and make sure that you can see:  Any grab bars or handrails.  First and last steps.  Where the edge of each step is.  Use tools that help you move around (mobility aids) if they are needed. These include:  Canes.  Walkers.  Scooters.  Crutches.  Turn on the lights when you go into a dark area. Replace any light bulbs as soon as they burn out.  Set up your furniture so you have a clear path. Avoid moving your furniture around.  If any of your floors are uneven, fix them.  If there are any pets around you, be aware of where they are.  Review your medicines with your doctor. Some medicines can make you feel dizzy. This can increase your chance of falling. Ask your doctor what other things that you can do to help prevent falls. This information is not intended to replace advice given to you by your health care provider. Make sure you discuss any questions you have with your health care provider. Document Released: 08/08/2009 Document Revised: 03/19/2016 Document Reviewed: 11/16/2014 Elsevier Interactive Patient Education  2017 [employer].

## 2021-02-19 NOTE — Progress Notes (Signed)
Subjective:   JAKORI BURKETT is a 80 y.o. male who presents for an Initial Medicare Annual Wellness Visit.  Review of Systems    n/a Cardiac Risk Factors include: advanced age (>74men, >92 women);diabetes mellitus;hypertension;dyslipidemia     Objective:    Today's Vitals   02/19/21 1540  BP: 120/70  Pulse: 74  Temp: 98 F (36.7 C)  SpO2: 98%  Weight: 154 lb (69.9 kg)  Height: 5\' 7"  (1.702 m)   Body mass index is 24.12 kg/m.  Advanced Directives 02/19/2021 10/17/2020 05/09/2020 08/29/2019 06/09/2019 06/24/2018 06/07/2016  Does Patient Have a Medical Advance Directive? Yes No No No No No No  Type of Paramedic of [place];Living will - - - - - -  Does patient want to make changes to medical advance directive? No - Patient declined - - - - No - Patient declined -  Copy of [place] in Chart? No - copy requested - - - - - -  Would patient like information on creating a medical advance directive? - No - Patient declined No - Patient declined Yes (MAU/Ambulatory/Procedural Areas - Information given) No - Patient declined No - Patient declined No - patient declined information    Current Medications (verified) Outpatient Encounter Medications as of 02/19/2021  Medication Sig  . acetaminophen (TYLENOL) 325 MG tablet Take 2 tablets (650 mg total) by mouth every 6 (six) hours as needed for mild pain (or Fever >/= 101).  . Ascorbic Acid (VITAMIN C) 100 MG tablet Take 100 mg by mouth daily.  Marland Kitchen aspirin 81 MG tablet Take 81 mg by mouth daily.  . bisoprolol (ZEBETA) 5 MG tablet Take 0.5 tablets (2.5 mg total) by mouth every Monday, Wednesday, and Friday.  . Calcium Citrate-Vitamin D (CALCIUM CITRATE +D PO) Take 1 tablet by mouth daily.   . carboxymethylcellulose (REFRESH PLUS) 0.5 % SOLN Place 1 drop into both eyes daily as needed (dry eyes).   . cyanocobalamin 1000 MCG tablet Take 1,000 mcg by mouth daily.   . fexofenadine (ALLEGRA) 180 MG  tablet Take 1 tablet (180 mg total) by mouth daily.  . metFORMIN (GLUCOPHAGE) 500 MG tablet Take 1 tablet (500 mg total) by mouth daily with breakfast.  . nitroGLYCERIN (NITROSTAT) 0.4 MG SL tablet Place 1 tablet (0.4 mg total) under the tongue every 5 (five) minutes as needed for chest pain.  . Vitamin E 400 units TABS Take 400 Units by mouth daily.  . Zinc 100 MG TABS Take 100 mg by mouth daily.  . Fluticasone-Umeclidin-Vilant (TRELEGY ELLIPTA) 100-62.5-25 MCG/INH AEPB Inhale 1 puff into the lungs daily. (Patient not taking: No sig reported)  . Ipratropium-Albuterol (COMBIVENT) 20-100 MCG/ACT AERS respimat Inhale 1 puff into the lungs 4 (four) times daily as needed for wheezing. (Patient not taking: Reported on 02/19/2021)  . [DISCONTINUED] atorvastatin (LIPITOR) 10 MG tablet Take 1 tablet (10 mg total) by mouth daily. (Patient not taking: No sig reported)   No facility-administered encounter medications on file as of 02/19/2021.    Allergies (verified) Aspirin, Oxycodone, Penicillins, Percocet [oxycodone-acetaminophen], Ciprofloxacin, and Prednisone   History: Past Medical History:  Diagnosis Date  . Abnormal nuclear stress test 06/02/2019  . Acute respiratory failure due to COVID-19 ([place]) 10/17/2020  . Anemia   . Angina pectoris ([place]) 06/02/2019   Formatting of this note might be different from the original. Normal cath 10/98, 3/16  . Anxiety   . Body mass index (BMI) 26.0-26.9, adult 02/16/2020  . CAD (  coronary artery disease) 07/21/2016  . Cardiac arrhythmia   . Cervical spondylosis 02/16/2020  . Chronic nonintractable headache 09/02/2020  . COPD (chronic obstructive pulmonary disease) ([place])   . COPD (chronic obstructive pulmonary disease) with emphysema ([place]) 12/14/2017  . COVID-19 virus infection 10/17/2020  . Elevated blood-pressure reading, without diagnosis of hypertension 02/16/2020  . Elevated LFTs 01/12/2020  . Frequent headaches 04/14/2018  . Gastro-esophageal reflux disease with  esophagitis 02/28/2014   Formatting of this note might be different from the original. Abnormal EGD 8/17  . Headache 01/06/2021  . Helicobacter pylori gastritis 07/21/2016  . Medicare annual wellness visit, initial 01/05/2017   Formatting of this note might be different from the original. 3/18  . Mixed dyslipidemia 01/12/2020  . Neck pain 02/16/2020  . Other chronic pain 09/02/2020  . Penicillin allergy 07/21/2016  . Pneumonia   . Pneumonia due to COVID-19 virus 10/17/2020  . Pulmonary nodule 02/28/2014   Formatting of this note might be different from the original. RML, no change  . Right groin pain 06/16/2019  . Skin cancer   . Trigeminal neuralgia 07/26/2020  . Type 2 diabetes mellitus with hyperglycemia, without long-term current use of insulin ([place]) 03/28/2019   Past Surgical History:  Procedure Laterality Date  . APPENDECTOMY    . back fusion    . cataract surgery Left   . CHOLECYSTECTOMY    . COLONOSCOPY  2014   clear   . LEFT HEART CATH AND CORONARY ANGIOGRAPHY N/A 06/09/2019   Procedure: LEFT HEART CATH AND CORONARY ANGIOGRAPHY;  Surgeon: Jettie Booze, MD;  Location: [facility];  Service: Cardiovascular;  Laterality: N/A;  . SPINE SURGERY  1984   thoracic spine fusion    Family History  Problem Relation Age of Onset  . Colon cancer Mother   . Diabetes Father   . Heart disease Father   . Stroke Father   . Hypertension Father   . Hyperlipidemia Father   . Heart disease Brother   . Pancreatic cancer Cousin        x 2  . Prostate cancer Cousin   . Heart disease Son    Social History   Socioeconomic History  . Marital status: Significant Other    Spouse name: Not on file  . Number of children: 3  . Years of education: 7  . Highest education level: Not on file  Occupational History  . Occupation: retired    Fish farm manager: [employer].  Tobacco Use  . Smoking status: Former Smoker    Packs/day: 1.50    Years: 64.00    Pack years: 96.00    Quit date:  2007    Years since quitting: 15.3  . Smokeless tobacco: Never Used  Vaping Use  . Vaping Use: Never used  Substance and Sexual Activity  . Alcohol use: No  . Drug use: No  . Sexual activity: Not on file  Other Topics Concern  . Not on file  Social History Narrative   Lives with significant other in a 2 story home.  Has 3 children.  Retired.  Education: 7th grade.    Social Determinants of Health   Financial Resource Strain: Low Risk   . Difficulty of Paying Living Expenses: Not hard at all  Food Insecurity: No Food Insecurity  . Worried About Charity fundraiser in the Last Year: Never true  . Ran Out of Food in the Last Year: Never true  Transportation Needs: No Transportation Needs  .  Lack of Transportation (Medical): No  . Lack of Transportation (Non-Medical): No  Physical Activity: Insufficiently Active  . Days of Exercise per Week: 1 day  . Minutes of Exercise per Session: 20 min  Stress: No Stress Concern Present  . Feeling of Stress : Not at all  Social Connections: Moderately Integrated  . Frequency of Communication with Friends and Family: More than three times a week  . Frequency of Social Gatherings with Friends and Family: More than three times a week  . Attends Religious Services: 1 to 4 times per year  . Active Member of Clubs or Organizations: No  . Attends Archivist Meetings: Never  . Marital Status: Living with partner    Tobacco Counseling Counseling given: Not Answered   Clinical Intake:  Pre-visit preparation completed: Yes  Pain : No/denies pain     Nutritional Risks: None Diabetes: Yes CBG done?: No Did pt. bring in CBG monitor from home?: No  How often do you need to have someone help you when you read instructions, pamphlets, or other written materials from your doctor or pharmacy?: 1 - Never What is the last grade level you completed in school?: middle school  Diabetic?yes Nutrition Risk Assessment:  Has the patient had  any N/V/D within the last 2 months?  No  Does the patient have any non-healing wounds?  No  Has the patient had any unintentional weight loss or weight gain?  No   Diabetes:  Is the patient diabetic?  Yes  If diabetic, was a CBG obtained today?  No  Did the patient bring in their glucometer from home?  No  How often do you monitor your CBG's? 1 time a week .   Financial Strains and Diabetes Management:  Are you having any financial strains with the device, your supplies or your medication? No .  Does the patient want to be seen by Chronic Care Management for management of their diabetes?  No  Would the patient like to be referred to a Nutritionist or for Diabetic Management?  No   Diabetic Exams:  Diabetic Eye Exam: Completed 01/2021 Diabetic Foot Exam: Completed next office visit    Interpreter Needed?: No  Information entered by :: [place] of Daily Living In your present state of health, do you have any difficulty performing the following activities: 02/19/2021 10/17/2020  Hearing? N N  Vision? N N  Difficulty concentrating or making decisions? N N  Walking or climbing stairs? N N  Dressing or bathing? N N  Doing errands, shopping? N N  Preparing Food and eating ? N -  Using the Toilet? N -  In the past six months, have you accidently leaked urine? N -  Do you have problems with loss of bowel control? N -  Managing your Medications? N -  Managing your Finances? N -  Housekeeping or managing your Housekeeping? N -  Some recent data might be hidden    Patient Care Team: [name], MD as PCP - General (Family Medicine) Germaine Pomfret, [facility] as Pharmacist (Pharmacist)  Indicate any recent Medical Services you may have received from other than [facility] providers in the past year (date may be approximate).     Assessment:   This is a routine wellness examination for Miley.  Hearing/Vision screen  Hearing Screening   125Hz  250Hz  500Hz  1000Hz   2000Hz  3000Hz  4000Hz  6000Hz  8000Hz   Right ear:           Left  ear:           Vision Screening Comments: Annual eye exams   Dietary issues and exercise activities discussed: Current Exercise Habits: Home exercise routine, Type of exercise: walking, Time (Minutes): 30, Frequency (Times/Week): 4, Weekly Exercise (Minutes/Week): 120, Intensity: Mild  Goals    . Chronic Care Management     CARE PLAN ENTRY  Current Barriers:  . Chronic Disease Management support, education, and care coordination needs related to mixed dyslipidemia and diabetes   Mixed Dyslipidemia . Pharmacist Clinical Goal(s): o Over the next 90 days, patient will work with PharmD and providers to maintain total cholesterol < 200 and LDL < 100 . Current regimen:  o None . Interventions: o Discussed importance of diet and exercise . Patient self care activities - Over the next 90 days, patient will: o Minimize intake of red meat and fast food o Keep self physically active by doing house work or yard work  Diabetes . Pharmacist Clinical Goal(s): o Over the next 90 days, patient will work with PharmD and providers to achieve A1c goal <7% . Current regimen:  o Metformin 500 mg 1 tablet twice a day with meals . Interventions: o Discussed the importance of diet and exercise . Patient self care activities - Over the next 90 days, patient will: o Check blood sugar once daily, document, and provide at future appointments o Contact provider with any episodes of hypoglycemia o Continue cutting down on sugar intake  Medication management . Pharmacist Clinical Goal(s): o Over the next 90 days, patient will work with PharmD and providers to maintain optimal medication adherence . Current pharmacy: [facility] . Interventions o Comprehensive medication review performed. o Continue current medication management strategy . Patient self care activities - Over the next 90 days, patient will: o Take medications as  prescribed o Report any questions or concerns to PharmD and/or provider(s)  Initial goal documentation     . DIET - INCREASE LEAN PROTEINS      Depression Screen PHQ 2/9 Scores 02/19/2021 02/19/2021 08/29/2019  PHQ - 2 Score 0 0 0    Fall Risk Fall Risk  02/19/2021 09/26/2020 08/29/2019 10/13/2018 05/27/2018  Falls in the past year? 0 1 0 0 No  Number falls in past yr: 0 1 - - -  Injury with Fall? 0 0 - - -  Follow up - - - Falls evaluation completed -    FALL RISK PREVENTION PERTAINING TO THE HOME:  Any stairs in or around the home? Yes  If so, are there any without handrails? Yes  Home free of loose throw rugs in walkways, pet beds, electrical cords, etc? Yes  Adequate lighting in your home to reduce risk of falls? Yes   ASSISTIVE DEVICES UTILIZED TO PREVENT FALLS:  Life alert? No  Use of a cane, walker or w/c? No  Grab bars in the bathroom? Yes  Shower chair or bench in shower? Yes  Elevated toilet seat or a handicapped toilet? Yes   TIMED UP AND GO:  Was the test performed? Yes .  Length of time to ambulate 10 feet: 5 sec.   Gait steady and fast without use of assistive device  Cognitive Function:     Normal cognitive status assessed by direct observation by this Nurse Health Advisor. No abnormalities found.      Immunizations Immunization History  Administered Date(s) Administered  . Influenza-Unspecified 08/17/2012  . Pneumococcal Polysaccharide-23 03/31/2002, 05/11/2016  . Tdap 06/13/2015  . Zoster 12/29/2012  TDAP status: Up to date  Flu Vaccine status: Up to date  Pneumococcal vaccine status: Up to date  Covid-19 vaccine status: Completed vaccines  Qualifies for Shingles Vaccine? Yes   Zostavax completed Yes   Shingrix Completed?: No.    Education has been provided regarding the importance of this vaccine. Patient has been advised to call insurance company to determine out of pocket expense if they have not yet received this vaccine. Advised may  also receive vaccine at local pharmacy or Health Dept. Verbalized acceptance and understanding.  Screening Tests Health Maintenance  Topic Date Due  . Hepatitis C Screening  Never done  . FOOT EXAM  Never done  . OPHTHALMOLOGY EXAM  Never done  . URINE MICROALBUMIN  Never done  . COVID-19 Vaccine (1) Never done  . PNA vac Low Risk Adult (2 of 2 - PCV13) 05/11/2017  . HEMOGLOBIN A1C  04/18/2021  . INFLUENZA VACCINE  05/26/2021  . TETANUS/TDAP  06/12/2025  . HPV VACCINES  Aged Out    Health Maintenance  Health Maintenance Due  Topic Date Due  . Hepatitis C Screening  Never done  . FOOT EXAM  Never done  . OPHTHALMOLOGY EXAM  Never done  . URINE MICROALBUMIN  Never done  . COVID-19 Vaccine (1) Never done  . PNA vac Low Risk Adult (2 of 2 - PCV13) 05/11/2017    Colorectal cancer screening: No longer required.   Lung Cancer Screening: (Low Dose CT Chest recommended if Age 5-80 years, 30 pack-year currently smoking OR have quit w/in 15years.) does not qualify.   Lung Cancer Screening Referral: n/a  Additional Screening:  Hepatitis C Screening: does not qualify  Vision Screening: Recommended annual ophthalmology exams for early detection of glaucoma and other disorders of the eye. Is the patient up to date with their annual eye exam?  Yes  Who is the provider or what is the name of the office in which the patient attends annual eye exams? Dr.Digby If pt is not established with a provider, would they like to be referred to a provider to establish care? No .   Dental Screening: Recommended annual dental exams for proper oral hygiene  Community Resource Referral / Chronic Care Management: CRR required this visit?  No   CCM required this visit?  No      Plan:     I have personally reviewed and noted the following in the patient's chart:   . Medical and social history . Use of alcohol, tobacco or illicit drugs  . Current medications and supplements . Functional  ability and status . Nutritional status . Physical activity . Advanced directives . List of other physicians . Hospitalizations, surgeries, and ER visits in previous 12 months . Vitals . Screenings to include cognitive, depression, and falls . Referrals and appointments  In addition, I have reviewed and discussed with patient certain preventive protocols, quality metrics, and best practice recommendations. A written personalized care plan for preventive services as well as general preventive health recommendations were provided to patient.     [name], LPN   [id number]   Nurse Notes: none
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
## 2021-03-27 ENCOUNTER — Encounter: Payer: Self-pay | Admitting: Neurology

## 2021-03-27 ENCOUNTER — Other Ambulatory Visit: Payer: Self-pay

## 2021-03-27 ENCOUNTER — Ambulatory Visit: Payer: Medicare HMO | Admitting: Neurology

## 2021-03-27 VITALS — BP 127/74 | HR 66 | Ht 67.0 in | Wt 147.6 lb

## 2021-03-27 DIAGNOSIS — M4802 Spinal stenosis, cervical region: Secondary | ICD-10-CM | POA: Diagnosis not present

## 2021-03-27 DIAGNOSIS — G4486 Cervicogenic headache: Secondary | ICD-10-CM | POA: Diagnosis not present

## 2021-03-27 NOTE — Patient Instructions (Addendum)
1.  Take the gabapentin as prescribed:  100mg  capsule - take 1 pill at bedtime for one week, then increase to 2 pills at bedtime.  Contact me for refill.  If no improvement by end of bottle, contact me and I will increase dose.

## 2021-04-08 ENCOUNTER — Telehealth: Payer: Self-pay

## 2021-04-08 ENCOUNTER — Telehealth: Payer: Self-pay | Admitting: Pharmacist

## 2021-04-08 MED ORDER — PEN NEEDLES 32G X 5 MM MISC: 1.0000 | Freq: Every day | 0 refills | Status: DC

## 2021-04-08 MED ORDER — LANTUS SOLOSTAR 100 UNIT/ML ~~LOC~~ SOPN: 10.0000 [IU] | PEN_INJECTOR | Freq: Every day | SUBCUTANEOUS | 0 refills | Status: DC

## 2021-04-08 NOTE — Chronic Care Management (AMB) (Signed)
Chronic Care Management Pharmacy Assistant   Name: Bill [name]  MRN: [id number] DOB: 04-Jul-1941  Reason for Encounter: Disease State   Conditions to be addressed/monitored: DMII    Recent office visits:  04.27.2022 Bill Pigg, LPN Medicare Wellness Exam 02.15.2022 Bill Morale, MD (PCP) patient seen for follow-up for covid pneumonia. Referral placed to pulmonary. 01.10.2022 Bill Morale, MD (PCP) patient seen for hospital follow-up due to Covid pneumonia. Medication changes during this appointment Sitagliptin Phosphate 50 mg Oral Daily was started and Dexamethasone 6 mg was discontinued. There was [name] referral placed to home health care.   Recent consult visits:  06.02.2022 Bill Partridge, DO Neurology patient seen for follow-up on headaches: medication changes Gabapentin increased to 300 mg at bedtime. 04.25.2022 [name], [name], MD Pulmonary Disease patient seen for follow-up on shortness of breath medication prescribed Ipratropium-Albuterol 20-100 MCG/ACT 1 puff Inhalation 4 times daily PRN 04.01.2022 Bill [name] Dermatology-patient seen for follow-up visit. 03.21.2022 Bill Bill [name] A, MD Pulmonary Disease initial consolidation for shortness of breath. Medication started Fluticasone-Umeclidin-Vilant 100-62.5-25 MCG/INH 1 puff Inhalation Daily 03.15.2022 Bill [name], [name], MD Cardiology patient seen for follow up visit for coronary artery disease. The patient reported not taking the following medication: Albuterol Sulfate 108 (90 Base) MCG, Benzonatate 100 mg, Gabapentin 100 mg, Pantoprazole Sodium 40 mg, and Sitagliptin Phosphate 50 mg. 02.18.2022 Bill [name] Podiatry initial console patient seen for left foot pain 01.19.2022 Bill Slick, MD Hematology and Oncology patient seen for follow-up appointment for myeloproliferative neoplasm.  Hospital visits:  None in previous 6 months  Medications: Outpatient Encounter Medications as of 04/08/2021   Medication Sig   acetaminophen (TYLENOL) 325 MG tablet Take 2 tablets (650 mg total) by mouth every 6 (six) hours as needed for mild pain (or Fever >/= 101).   Ascorbic Acid (VITAMIN C) 100 MG tablet Take 100 mg by mouth daily.   aspirin 81 MG tablet Take 81 mg by mouth daily.   bisoprolol (ZEBETA) 5 MG tablet Take 0.5 tablets (2.5 mg total) by mouth every Monday, Wednesday, and Friday.   Calcium Citrate-Vitamin D (CALCIUM CITRATE +D PO) Take 1 tablet by mouth daily.    carboxymethylcellulose (REFRESH PLUS) 0.5 % SOLN Place 1 drop into both eyes daily as needed (dry eyes).    cyanocobalamin 1000 MCG tablet Take 1,000 mcg by mouth daily.    fexofenadine (ALLEGRA) 180 MG tablet Take 1 tablet (180 mg total) by mouth daily.   Fluticasone-Umeclidin-Vilant (TRELEGY ELLIPTA) 100-62.5-25 MCG/INH AEPB Inhale 1 puff into the lungs daily. (Patient not taking: No sig reported)   Ipratropium-Albuterol (COMBIVENT) 20-100 MCG/ACT AERS respimat Inhale 1 puff into the lungs 4 (four) times daily as needed for wheezing. (Patient not taking: Reported on 02/19/2021)   metFORMIN (GLUCOPHAGE) 500 MG tablet Take 1 tablet (500 mg total) by mouth daily with breakfast.   nitroGLYCERIN (NITROSTAT) 0.4 MG SL tablet Place 1 tablet (0.4 mg total) under the tongue every 5 (five) minutes as needed for chest pain.   Vitamin E 400 units TABS Take 400 Units by mouth daily.   Zinc 100 MG TABS Take 100 mg by mouth daily.   [DISCONTINUED] atorvastatin (LIPITOR) 10 MG tablet Take 1 tablet (10 mg total) by mouth daily. (Patient not taking: No sig reported)   No facility-administered encounter medications on file as of 04/08/2021.   Recent Relevant Labs: Lab Results  Component Value Date/Time   HGBA1C 7.9 (H) 10/18/2020 07:35 AM   HGBA1C 7.4 (  H) 04/16/2020 02:51 PM    Kidney Function Lab Results  Component Value Date/Time   CREATININE 0.90 11/13/2020 03:13 PM   CREATININE 0.89 10/21/2020 03:50 AM   CREATININE 0.78 10/20/2020  04:43 AM   CREATININE 0.98 04/19/2020 01:30 PM   GFR 82.76 04/16/2020 02:51 PM   GFRNONAA >60 11/13/2020 03:13 PM   GFRAA >60 05/09/2020 02:33 PM   GFRAA >60 04/19/2020 01:30 PM    Current antihyperglycemic regimen:  Metformin 500 mg  0.5 tablets by mouth every Monday, Wednesday ad Friday. What recent interventions/DTPs have been made to improve glycemic control:  None Have there been any recent hospitalizations or ED visits since last visit with CPP? No Patient denies hypoglycemic symptoms, including Sweaty, Shaky, Hungry, Nervous/irritable, and Vision changes Patient reports hyperglycemic symptoms, including excessive thirst, fatigue, and weakness How often are you checking your blood sugar? before breakfast What are your blood sugars ranging?  Fasting: Over  400 Before meals:  After meals:  Bedtime:  During the week, how often does your blood glucose drop below 70? Never Are you checking your feet daily/regularly? Yes  Adherence Review: Is the patient currently on [name] STATIN medication? No Is the patient currently on ACE/ARB medication? No Does the patient have >5 day gap between last estimated fill dates? Yes  I spoke with the patient about medication adherence. He stated that he has not been feeling well recently. He states that he only takes his blood sugar once or twice [name] week and for the past couple of weeks it has been around 400. He stated that he stopped taking his metformin about [name] week ago. He states that he is experiencing headaches and some weight loss period even before the medication had stopped. He has not had any falls or hospital visits since his last PCP or PCP visit.  The patient has started two inhalers Fluticasone-Umeclidin-Vilant 100-62.5-25 MCG/INH and Ipratropium-Albuterol 20-100 MCG/ACT 1 puff Inhalation 4 times daily PRN. Also, Gabapentin was increased to 300 mg at bedtime. I contacted the CPP to see if the patient needs an appointment with his primary care  doctor due to his elevated blood sugars. He currently is not having issues with his pharmacy.  Star Rating Drugs: Medication Dispensed  Quantity Pharmacy  Metformin 500 mg 03.09.2022 [place] Revonda Standard, [place] Pharmacist Assistant [phone]

## 2021-04-09 NOTE — Telephone Encounter (Signed)
Called and spoke with patient after his report of having blood sugars in the 400s. He reports ongoing increased urination, headaches and fatigue. Patient stopped his metformin as he felt this was "not helping" and had some stomach upset and heard bad information about it.  Spoke with Dr. Regis Bill in the absence of Dr. Sarajane Jews and she agreed to send in a prescription for Lantus 10 units daily and pen needles. Discussed with patient and educated him on injecting insulin over the phone but recommended further education with pharmacist at his [place].   Patient was in agreement with plan. Scheduled him for follow up with PCP on Friday 6/17 and will plan to do a BG assessment in 2 weeks. Sent message to PCP to make him aware.

## 2021-04-11 ENCOUNTER — Ambulatory Visit (INDEPENDENT_AMBULATORY_CARE_PROVIDER_SITE_OTHER): Payer: Medicare HMO | Admitting: Family Medicine

## 2021-04-11 ENCOUNTER — Other Ambulatory Visit: Payer: Self-pay

## 2021-04-11 ENCOUNTER — Encounter: Payer: Self-pay | Admitting: Family Medicine

## 2021-04-11 VITALS — BP 110/76 | HR 66 | Temp 97.9°F | Wt 144.5 lb

## 2021-04-11 DIAGNOSIS — E1165 Type 2 diabetes mellitus with hyperglycemia: Secondary | ICD-10-CM | POA: Insufficient documentation

## 2021-04-11 DIAGNOSIS — E119 Type 2 diabetes mellitus without complications: Secondary | ICD-10-CM

## 2021-04-11 DIAGNOSIS — Z794 Long term (current) use of insulin: Secondary | ICD-10-CM

## 2021-04-11 DIAGNOSIS — E1169 Type 2 diabetes mellitus with other specified complication: Secondary | ICD-10-CM

## 2021-04-11 HISTORY — DX: Type 2 diabetes mellitus without complications: E11.9

## 2021-04-11 LAB — BASIC METABOLIC PANEL WITH GFR
BUN: 15 mg/dL (ref 6–23)
CO2: 25 meq/L (ref 19–32)
Calcium: 9.2 mg/dL (ref 8.4–10.5)
Chloride: 98 meq/L (ref 96–112)
Creatinine, Ser: 0.75 mg/dL (ref 0.40–1.50)
GFR: 86.39 mL/min
Glucose, Bld: 413 mg/dL — ABNORMAL HIGH (ref 70–99)
Potassium: 4.1 meq/L (ref 3.5–5.1)
Sodium: 132 meq/L — ABNORMAL LOW (ref 135–145)

## 2021-04-11 LAB — POCT GLUCOSE (DEVICE FOR HOME USE): Glucose Fasting, POC: 444 mg/dL — AB (ref 70–99)

## 2021-04-11 LAB — HEMOGLOBIN A1C: Hgb A1c MFr Bld: 16.8 % — ABNORMAL HIGH (ref 4.6–6.5)

## 2021-04-11 MED ORDER — LANTUS SOLOSTAR 100 UNIT/ML ~~LOC~~ SOPN: 60.0000 [IU] | PEN_INJECTOR | Freq: Every day | SUBCUTANEOUS | 11 refills | Status: DC

## 2021-04-11 MED ORDER — NOVOLOG MIX 70/30 FLEXPEN (70-30) 100 UNIT/ML ~~LOC~~ SUPN: 10.0000 [IU] | PEN_INJECTOR | Freq: Two times a day (BID) | SUBCUTANEOUS | 11 refills | Status: DC

## 2021-04-11 NOTE — Progress Notes (Signed)
   Subjective:    Patient ID: Bill [name], male    DOB: 1941/06/25, 80 y.o.   MRN: [id number]  HPI Here to follow up on diabetes that is poorly controlled. His last A1c in December was 7.9, but lately his glucoses have been steadily climbing. He was also taking Metformin, but hs stopped this one week ago due to GI side effects. He is taking 10 units of Lantus at bedtime. His glucoses at home have been in the 400s to 500s, and this morning his fasting glucose here is 444. He admits to feeling very fatigued, he is thirsty, and he is urinating frequently.    Review of Systems  Constitutional:  Positive for fatigue.  Respiratory: Negative.    Cardiovascular: Negative.   Genitourinary:  Positive for frequency. Negative for dysuria and urgency.  Neurological: Negative.       Objective:   Physical Exam Constitutional:      Appearance: Normal appearance. He is not ill-appearing.  Cardiovascular:     Rate and Rhythm: Normal rate and regular rhythm.     Pulses: Normal pulses.     Heart sounds: Normal heart sounds.  Pulmonary:     Effort: Pulmonary effort is normal.     Breath sounds: Normal breath sounds.  Neurological:     General: No focal deficit present.     Mental Status: He is alert. Mental status is at baseline.          Assessment & Plan:  Type 2 diabetes, poorly controlled. We will increase the Lantus to 60 units at bedtime. We will also start him on Novolog 70/30 to take 10 units BID before meals. Check a BMET and an A1c today. He wil check his glucoses at home twice daily and he will report back to [place] in one week. No doubt we will need to titrate the insulin doses.  Alysia Penna, MD

## 2021-04-14 ENCOUNTER — Telehealth: Payer: Self-pay | Admitting: Family Medicine

## 2021-04-14 NOTE — Telephone Encounter (Signed)
Bill [name] called on behalf of the patient stating that Dr. Sarajane Jews adjusted his insulin after his appointment and the patient's sugar has dropped. Bill [name] would like to know if there are any other adjustments that can be made to make sure the patient's sugar doesn't drop too low.    Bill [name] would like a call back at [phone].  Please advise.

## 2021-04-15 NOTE — Telephone Encounter (Signed)
Last office visit-04/11/21

## 2021-04-16 NOTE — Telephone Encounter (Signed)
Decrease the Lantus dose at bedtime to 40 units

## 2021-04-16 NOTE — Telephone Encounter (Signed)
LVM for daughter Fraser Din to call office for message

## 2021-04-17 NOTE — Telephone Encounter (Signed)
Spoke with pt verbalized understanding of Dr Fry advise 

## 2021-04-18 ENCOUNTER — Telehealth: Payer: Self-pay | Admitting: Pharmacist

## 2021-04-18 ENCOUNTER — Telehealth: Payer: Self-pay | Admitting: Family Medicine

## 2021-04-18 NOTE — Telephone Encounter (Signed)
Pat call and stated [place] stated that pt need a oxygen test and also need a RX for oxygen sent to this fax # [phone].

## 2021-04-18 NOTE — Telephone Encounter (Signed)
Attempted to call [place], office was closed, will try again on Monday

## 2021-04-18 NOTE — Chronic Care Management (AMB) (Signed)
Chronic Care Management Pharmacy Assistant   Name: Bill [name]  MRN: [id number] DOB: 1941-03-24  Reason for Encounter: Disease State   Conditions to be addressed/monitored: DMII  Recent office visits:  06.17.2022 [name] MD -patient seen for follow-up on diabetes: Medication prescribed Lantus increased to 60 units and added Novolog 70/30 take 10 units twice daily before meals. Recent consult visits:  None  Hospital visits:  None in previous 6 months  Medications: Outpatient Encounter Medications as of 04/18/2021  Medication Sig   acetaminophen (TYLENOL) 325 MG tablet Take 2 tablets (650 mg total) by mouth every 6 (six) hours as needed for mild pain (or Fever >/= 101).   Ascorbic Acid (VITAMIN C) 100 MG tablet Take 100 mg by mouth daily.   aspirin 81 MG tablet Take 81 mg by mouth daily.   bisoprolol (ZEBETA) 5 MG tablet Take 0.5 tablets (2.5 mg total) by mouth every Monday, Wednesday, and Friday.   Calcium Citrate-Vitamin D (CALCIUM CITRATE +D PO) Take 1 tablet by mouth daily.    carboxymethylcellulose (REFRESH PLUS) 0.5 % SOLN Place 1 drop into both eyes daily as needed (dry eyes).    cyanocobalamin 1000 MCG tablet Take 1,000 mcg by mouth daily.    fexofenadine (ALLEGRA) 180 MG tablet Take 1 tablet (180 mg total) by mouth daily.   Fluticasone-Umeclidin-Vilant (TRELEGY ELLIPTA) 100-62.5-25 MCG/INH AEPB Inhale 1 puff into the lungs daily.   insulin aspart protamine - aspart (NOVOLOG MIX 70/30 FLEXPEN) (70-30) 100 UNIT/ML FlexPen Inject 0.1 mLs (10 Units total) into the skin 2 (two) times daily with a meal.   insulin glargine (LANTUS SOLOSTAR) 100 UNIT/ML Solostar Pen Inject 60 Units into the skin at bedtime.   Insulin Pen Needle (PEN NEEDLES) 32G X 5 MM MISC 1 Device by Does not apply route daily at 2 PM.   Ipratropium-Albuterol (COMBIVENT) 20-100 MCG/ACT AERS respimat Inhale 1 puff into the lungs 4 (four) times daily as needed for wheezing.   metFORMIN (GLUCOPHAGE)  500 MG tablet Take 1 tablet (500 mg total) by mouth daily with breakfast. (Patient not taking: Reported on 04/11/2021)   nitroGLYCERIN (NITROSTAT) 0.4 MG SL tablet Place 1 tablet (0.4 mg total) under the tongue every 5 (five) minutes as needed for chest pain.   Vitamin E 400 units TABS Take 400 Units by mouth daily.   Zinc 100 MG TABS Take 100 mg by mouth daily.   [DISCONTINUED] atorvastatin (LIPITOR) 10 MG tablet Take 1 tablet (10 mg total) by mouth daily. (Patient not taking: No sig reported)   No facility-administered encounter medications on file as of 04/18/2021.   Recent Relevant Labs: Lab Results  Component Value Date/Time   HGBA1C 16.8 Repeated and verified X2. (H) 04/11/2021 11:19 AM   HGBA1C 7.9 (H) 10/18/2020 07:35 AM    Kidney Function Lab Results  Component Value Date/Time   CREATININE 0.75 04/11/2021 11:19 AM   CREATININE 0.90 11/13/2020 03:13 PM   CREATININE 0.89 10/21/2020 03:50 AM   CREATININE 0.98 04/19/2020 01:30 PM   GFR 86.39 04/11/2021 11:19 AM   GFRNONAA >60 11/13/2020 03:13 PM   GFRAA >60 05/09/2020 02:33 PM   GFRAA >60 04/19/2020 01:30 PM   Current antihyperglycemic regimen:  Lantus  60 units: inject 60 units into skin at bedtime Novlog Mix 70/30 flexpen:Inject 0.1 mLs (10 Units total) into the skin 2 (two) times daily with a meal. What recent interventions/DTPs have been made to improve glycemic control:  Lantus  60 units: inject 60  units into skin at bedtime Novlog Mix 70/30 flexpen:Inject 0.1 mLs (10 Units total) into the skin 2 (two) times daily with a meal. Have there been any recent hospitalizations or ED visits since last visit with CPP? No Patient denies hypoglycemic symptoms, including Pale, Sweaty, Shaky, Hungry, Nervous/irritable, and Vision changes Patient reports hyperglycemic symptoms, including fatigue How often are you checking your blood sugar? twice daily What are your blood sugars ranging?  Fasting:   06.24 153 06.23 104 06.22 180 06.21 204 06.20 110 06.19 167 Before meals:  After meals:  Bedtime:  06.22 181  06.19 393 During the week, how often does your blood glucose drop below 70? Never Are you checking your feet daily/regularly? Yes  Adherence Review: Is the patient currently on a STATIN medication? No Is the patient currently on ACE/ARB medication? No Does the patient have >5 day gap between last estimated fill dates? No  I spoke with the patient about medication adherence. He states that he has been feeling much better since the adjustments in his medication. Lantus increased to 60 units and Novolog 70/30 take 10 units twice daily before meals were added to his regiment. He states that he has not had any side effects from his medication. Now takes his blood sugar twice daily. He states that he is more aware of his diet. He has been cutting back on the carbohydrates and increasing his water intake. He continues his same Exercise routine. There have been no other changes to his medications. No recent falls or injuries. There have been no hospital, urgent care, or emergency department visits since his last PCP or CPP visit. His next CCM visit is scheduled for August 2022. He is not having any difficulties with his pharmacy. He is currently using [facility].  Star Rating Drugs: Medication Dispensed  Quantity Pharmacy  Metformin 500 mg 02.23.2022 [address] [facility]   Amilia Revonda Standard, [place] Pharmacist Assistant [phone]

## 2021-04-22 NOTE — Telephone Encounter (Signed)
Spoke with pt stated that [facility] needs order for O2. The oxygen Test results are on epic and have been printed ready to fax, Please advise if ok for O2 order and the recommended liters

## 2021-04-23 NOTE — Telephone Encounter (Signed)
He has a Technical brewer, Dr. Ander Slade. He is the appropriate person to ask about oxygen orders

## 2021-04-25 ENCOUNTER — Telehealth: Payer: Self-pay | Admitting: Pulmonary Disease

## 2021-04-25 NOTE — Telephone Encounter (Signed)
Called and spoke with pt and he stated that he really has not been using the oxygen at home and he is not sure that he still needs it.  [facility] called him and said that they would need a new rx for the oxygen and he would need a walk test. They told him to reach out to his lung doctor.  We will have to reach out to [facility] on Tuesday and see what king of test and information that they need.

## 2021-04-25 NOTE — Telephone Encounter (Signed)
Spoke with pt  advised to call his pulmonologist office and have them take care of the above message

## 2021-04-29 NOTE — Telephone Encounter (Signed)
Message sent to [facility] to verify only a walk test is needed for pt.

## 2021-04-30 ENCOUNTER — Telehealth: Payer: Self-pay

## 2021-04-30 DIAGNOSIS — Z794 Long term (current) use of insulin: Secondary | ICD-10-CM

## 2021-04-30 DIAGNOSIS — E1169 Type 2 diabetes mellitus with other specified complication: Secondary | ICD-10-CM

## 2021-04-30 NOTE — Telephone Encounter (Signed)
Pt message sent to Dr Fry for advise 

## 2021-05-01 NOTE — Telephone Encounter (Signed)
I have referred him to a diabetes specialist (Endocrinology) to manage the insulin. They will call him for an appt soon

## 2021-05-01 NOTE — Telephone Encounter (Signed)
Left a message for pt to call the office regarding Dr Sarajane Jews advise on his Diabetes/ medications

## 2021-05-01 NOTE — Addendum Note (Signed)
Addended by: Alysia Penna A on: 05/01/2021 07:56 AM   Modules accepted: Orders

## 2021-05-05 NOTE — Telephone Encounter (Signed)
Spoke with pt advised of Dr Sarajane Jews recommendation to see an Endocrinology for his Diabetes, pt verbalized understanding

## 2021-05-06 ENCOUNTER — Telehealth: Payer: Self-pay | Admitting: Hematology and Oncology

## 2021-05-06 DIAGNOSIS — H353122 Nonexudative age-related macular degeneration, left eye, intermediate dry stage: Secondary | ICD-10-CM | POA: Diagnosis not present

## 2021-05-06 DIAGNOSIS — H16223 Keratoconjunctivitis sicca, not specified as Sjogren's, bilateral: Secondary | ICD-10-CM | POA: Diagnosis not present

## 2021-05-06 DIAGNOSIS — H3554 Dystrophies primarily involving the retinal pigment epithelium: Secondary | ICD-10-CM | POA: Diagnosis not present

## 2021-05-06 DIAGNOSIS — H353111 Nonexudative age-related macular degeneration, right eye, early dry stage: Secondary | ICD-10-CM | POA: Diagnosis not present

## 2021-05-06 DIAGNOSIS — E119 Type 2 diabetes mellitus without complications: Secondary | ICD-10-CM | POA: Diagnosis not present

## 2021-05-06 DIAGNOSIS — Z961 Presence of intraocular lens: Secondary | ICD-10-CM | POA: Diagnosis not present

## 2021-05-06 DIAGNOSIS — H43813 Vitreous degeneration, bilateral: Secondary | ICD-10-CM | POA: Diagnosis not present

## 2021-05-06 DIAGNOSIS — H52203 Unspecified astigmatism, bilateral: Secondary | ICD-10-CM | POA: Diagnosis not present

## 2021-05-06 DIAGNOSIS — Z794 Long term (current) use of insulin: Secondary | ICD-10-CM | POA: Diagnosis not present

## 2021-05-06 NOTE — Telephone Encounter (Signed)
Tish Men, RN; Darlina Guys; Genia Del I'll take a look   -------------------------------------------------------  A follow up message was sent to [facility].

## 2021-05-06 NOTE — Telephone Encounter (Signed)
R/s 7/18 appt due to provider on call scheduled. Called and left msg.

## 2021-05-08 NOTE — Telephone Encounter (Signed)
Lmtcb for pt.  Will see if pt is willing to do a walk test and keep O2.      Smitty Knudsen, RN; Stephannie Peters; [place], Remus Blake, Mike Craze PT currently has O2 with [place]. He will need a new O2 order and sats Please.

## 2021-05-12 ENCOUNTER — Telehealth: Payer: Self-pay | Admitting: Family Medicine

## 2021-05-12 ENCOUNTER — Ambulatory Visit: Payer: Medicare HMO | Admitting: Hematology and Oncology

## 2021-05-12 ENCOUNTER — Other Ambulatory Visit: Payer: Medicare HMO

## 2021-05-12 NOTE — Telephone Encounter (Signed)
Patient called stating that he has been stung by wasps and he has swelling from his hand up to his elbow. He says that he has taken benadryl but he is still swollen.  Patient is wanting to know if Dr. Sarajane Jews can call him in something for the swelling from the stings. He was offered the next available appointment for tomorrow morning but declined.   Patient's contact number is [phone].  Patient is requesting a call back by 5pm today or he will go to urgent care.  Please advise.

## 2021-05-12 NOTE — Telephone Encounter (Signed)
Please advise.  Pharmacy has been updated

## 2021-05-12 NOTE — Telephone Encounter (Signed)
Patient uses [facility] [place]

## 2021-05-13 ENCOUNTER — Telehealth: Payer: Self-pay | Admitting: Pharmacist

## 2021-05-13 NOTE — Telephone Encounter (Signed)
He needs to be seen for this. I have slots available this afternoon

## 2021-05-13 NOTE — Telephone Encounter (Signed)
Lmtcb for pt.  

## 2021-05-13 NOTE — Telephone Encounter (Signed)
Spoke with pt who states he wants O2 d/c'd. Informed pt that we would need him to come into an OV to make sure we could d/c the O2. Pt stated understanding and scheduled OV with BW on 06/02/21. Nothing further needed at this time.

## 2021-05-13 NOTE — Chronic Care Management (AMB) (Signed)
I called to speak with the patient to make a follow-up visit with CPP. A follow-up visit was made for July 26 at 3:00 PM. The patient did have a question about how much insulin to take. He stated that he was confused about how much to take when his blood sugars are below 140. He states that in the past when he has taken the 10 units of the Novolog when his blood sugars have been between 140 and 170, he has felt awful. I spoke with the CPP Madeline, and it advised (per CCP) for him to take 5 units of NovoLog when his blood sugars are in that range. I informed the patient and he understood. It was reemphasized to him that it was very important that he write down his blood sugars and how many units he is taking at each meal. He understood.    Maia Breslow, [place] Pharmacist Assistant [phone]

## 2021-05-13 NOTE — Telephone Encounter (Signed)
The patient called back and stated that he started taking benadryl and the swelling in his hand has gone done and he can move his hand but it hurts. So he doesn't feel like he needs an appointment at this time.  FYI

## 2021-05-13 NOTE — Telephone Encounter (Signed)
Left a detailed message at the pts cell number stating Dr Sarajane Jews advised a visit, he does have an opening to arrive at 3:45pm today and if this would work, to please call the office to schedule an appt.

## 2021-05-15 ENCOUNTER — Other Ambulatory Visit: Payer: Medicare HMO

## 2021-05-15 ENCOUNTER — Ambulatory Visit: Payer: Medicare HMO | Admitting: Hematology and Oncology

## 2021-05-19 ENCOUNTER — Telehealth: Payer: Self-pay | Admitting: Pharmacist

## 2021-05-19 NOTE — Chronic Care Management (AMB) (Signed)
Date- Patient called to remind of appointment with Watt Climes on 07.26.2022 At 3:00 pm  No answer, left message of appointment date, time and type of appointment (telephone ). Left message to have all medications, supplements, blood pressure and/or blood sugar logs available during appointment and to return call if need to reschedule.  Care Gaps: None  Star Rating Drug: None  Any gaps in medications fill history?No  Maia Breslow, [place] Pharmacist Assistant [phone]

## 2021-05-20 ENCOUNTER — Ambulatory Visit (INDEPENDENT_AMBULATORY_CARE_PROVIDER_SITE_OTHER): Payer: Medicare HMO | Admitting: Pharmacist

## 2021-05-20 DIAGNOSIS — Z794 Long term (current) use of insulin: Secondary | ICD-10-CM

## 2021-05-20 DIAGNOSIS — E1169 Type 2 diabetes mellitus with other specified complication: Secondary | ICD-10-CM | POA: Diagnosis not present

## 2021-05-20 DIAGNOSIS — E782 Mixed hyperlipidemia: Secondary | ICD-10-CM

## 2021-05-20 NOTE — Progress Notes (Signed)
Chronic Care Management Pharmacy Note  05/21/2021 Name:  Bill [name] MRN:  [id number] DOB:  1941-06-30  Summary: BGs have been fluctuating throughout the day Pt is unsure of how many units he should be injecting for his insulin or when to give it  Recommendations/Changes made from today's visit: -Recommend switching Novolog mix to rapid acting insulin to avoid over treating with basal insulin -Plan to apply for patient assistance for insulin once regimen is solidified   Plan: Plan to apply for patient assistance for insulin Follow up in 2 weeks  Subjective: Bill [name] is an 80 y.o. year old male who is a primary patient of [name], MD.  The CCM team was consulted for assistance with disease management and care coordination needs.    Engaged with patient by telephone for follow up visit in response to provider referral for pharmacy case management and/or care coordination services.   Consent to Services:  The patient was given information about Chronic Care Management services, agreed to services, and gave verbal consent prior to initiation of services.  Please see initial visit note for detailed documentation.   Patient Care Team: [name], MD as PCP - General (Family Medicine) Viona Gilmore, [facility] as Pharmacist (Pharmacist)  Recent office visits: 04/11/21 Alysia Penna, MD: Patient presented for diabetes follow up. A1c was 16.8%. Prescribed Novolog mix 70/30 inject 10 units twice daily and increased Lantus to 60 units daily at bedtime.   04/08/21 CCM telephone call:  Patient stopped his metformin on his own and is having BGs in the 400s. Prescribed Lantus 10 units daily.  02/19/21 [name], LPN: Patient presented for AWV.  12/10/20 Alysia Penna, MD: Patient presented for pulmonary emphysema. Referred to pulmonology.  Recent consult visits: 05/06/21 Devonne Doughty, MD: Patient presented for diabetic eye exam.  03/27/21 Metta Clines, DO (neurology):  Patient presented for headache follow up. Prescribed gabapentin 200 mg at bedtime and then increase to 300 mg if no relief in 4-5 weeks.  02/17/21 Sherrilyn Rist, MD (pulmonary): Patient presented for abnormal CT of chest. Trelegy made symptoms worse. Switch to Combivent respimat 1 puff four times daily as needed.  01/13/21 Sherrilyn Rist, MD (pulmonary): Patient presented for post COVID SOB. Prescribed trelegy.   01/07/21 Jyl Heinz, MD (cardiology): Patient presented for CAD follow up.   Hospital visits: None in previous 6 months   Objective:  Lab Results  Component Value Date   CREATININE 0.75 04/11/2021   BUN 15 04/11/2021   GFR 86.39 04/11/2021   GFRNONAA >60 11/13/2020   GFRAA >60 05/09/2020   NA 132 (L) 04/11/2021   K 4.1 04/11/2021   CALCIUM 9.2 04/11/2021   CO2 25 04/11/2021   GLUCOSE 413 (H) 04/11/2021    Lab Results  Component Value Date/Time   HGBA1C 16.8 Repeated and verified X2. (H) 04/11/2021 11:19 AM   HGBA1C 7.9 (H) 10/18/2020 07:35 AM   GFR 86.39 04/11/2021 11:19 AM   GFR 82.76 04/16/2020 02:51 PM    Last diabetic Eye exam: No results found for: HMDIABEYEEXA  Last diabetic Foot exam: No results found for: HMDIABFOOTEX   Lab Results  Component Value Date   CHOL 182 04/16/2020   HDL 39.70 04/16/2020   LDLCALC 117 (H) 04/16/2020   TRIG 63 10/17/2020   CHOLHDL 5 04/16/2020    Hepatic Function Latest Ref Rng & Units 11/13/2020 10/21/2020 10/20/2020  Total Protein 6.5 - 8.1 g/dL 6.7 6.1(L) 5.6(L)  Albumin 3.5 - 5.0 g/dL 3.0(L)  2.8(L) 2.5(L)  AST 15 - 41 U/L 21 28 24   ALT 0 - 44 U/L 36 37 30  Alk Phosphatase 38 - 126 U/L 119 70 63  Total Bilirubin 0.3 - 1.2 mg/dL 0.4 0.8 0.9  Bilirubin, Direct 0.0 - 0.2 mg/dL - - -    Lab Results  Component Value Date/Time   TSH 1.84 04/16/2020 02:51 PM   TSH 2.67 03/28/2019 03:27 PM    CBC Latest Ref Rng & Units 11/13/2020 10/21/2020 10/20/2020  WBC 4.0 - 10.5 K/uL 16.0(H) 18.0(H) 15.1(H)  Hemoglobin 13.0  - 17.0 g/dL 13.5 14.4 13.6  Hematocrit 39.0 - 52.0 % 40.1 43.2 38.8(L)  Platelets 150 - 400 K/uL 392 303 258    No results found for: VD25OH  Clinical ASCVD: Yes  The 10-year ASCVD risk score Mikey Bussing DC Jr., et al., 2013) is: 46.6%   Values used to calculate the score:     Age: 80 years     Sex: Male     Is Non-Hispanic African American: No     Diabetic: Yes     Tobacco smoker: No     Systolic Blood Pressure: 250 mmHg     Is BP treated: Yes     HDL Cholesterol: 39.7 mg/dL     Total Cholesterol: 182 mg/dL    Depression screen [facility] 2/9 02/19/2021 02/19/2021 08/29/2019  Decreased Interest 0 0 0  Down, Depressed, Hopeless 0 0 0  PHQ - 2 Score 0 0 0     Social History   Tobacco Use  Smoking Status Former   Packs/day: 1.50   Years: 64.00   Pack years: 96.00   Types: Cigarettes   Quit date: 2007   Years since quitting: 15.5  Smokeless Tobacco Never   BP Readings from Last 3 Encounters:  04/11/21 110/76  03/27/21 127/74  02/19/21 120/70   Pulse Readings from Last 3 Encounters:  04/11/21 66  03/27/21 66  02/19/21 74   Wt Readings from Last 3 Encounters:  04/11/21 144 lb 8 oz (65.5 kg)  03/27/21 147 lb 9.6 oz (67 kg)  02/19/21 154 lb (69.9 kg)   BMI Readings from Last 3 Encounters:  04/11/21 22.63 kg/m  03/27/21 23.12 kg/m  02/19/21 24.12 kg/m    Assessment/Interventions: Review of patient past medical history, allergies, medications, health status, including review of consultants reports, laboratory and other test data, was performed as part of comprehensive evaluation and provision of chronic care management services.   SDOH:  (Social Determinants of Health) assessments and interventions performed: No  SDOH Screenings   Alcohol Screen: Low Risk    Last Alcohol Screening Score (AUDIT): 0  Depression (PHQ2-9): Low Risk    PHQ-2 Score: 0  Financial Resource Strain: Low Risk    Difficulty of Paying Living Expenses: Not hard at all  Food Insecurity: No Food  Insecurity   Worried About Charity fundraiser in the Last Year: Never true   Ran Out of Food in the Last Year: Never true  Housing: Low Risk    Last Housing Risk Score: 0  Physical Activity: Insufficiently Active   Days of Exercise per Week: 1 day   Minutes of Exercise per Session: 20 min  Social Connections: Moderately Integrated   Frequency of Communication with Friends and Family: More than three times a week   Frequency of Social Gatherings with Friends and Family: More than three times a week   Attends Religious Services: 1 to 4 times per year  Active Member of Clubs or Organizations: No   Attends Archivist Meetings: Never   Marital Status: Living with partner  Stress: No Stress Concern Present   Feeling of Stress : Not at all  Tobacco Use: Medium Risk   Smoking Tobacco Use: Former   Smokeless Tobacco Use: Never  Transportation Needs: Not on file    CCM Care Plan  Allergies  Allergen Reactions   Aspirin     Takes ASA 41m at home.   Oxycodone Nausea Only    PERCOCET- weakness, dizziness, shaking, nausea   Penicillins Hives    Did it involve swelling of the face/tongue/throat, SOB, or low BP? No Did it involve sudden or severe rash/hives, skin peeling, or any reaction on the inside of your mouth or nose? Yes Did you need to seek medical attention at a hospital or doctor's office? Yes When did it last happen?      long time If all above answers are "NO", may proceed with cephalosporin use.    Percocet [Oxycodone-Acetaminophen]    Ciprofloxacin Rash   Prednisone Palpitations    Medications Reviewed Today     Reviewed by FLaurey Morale MD (Physician) on 04/11/21 at 1053  Med List Status: <None>   Medication Order Taking? Sig Documenting Provider Last Dose Status Informant  acetaminophen (TYLENOL) 325 MG tablet [id number] Take 2 tablets (650 mg total) by mouth every 6 (six) hours as needed for mild pain (or Fever >/= 101). Elgergawy, DSilver Huguenin MD  Taking Active   Ascorbic Acid (VITAMIN C) 100 MG tablet [id number] Take 100 mg by mouth daily. [provider] Taking Active Self  aspirin 81 MG tablet [id number] Take 81 mg by mouth daily. [provider] Taking Active Self   Patient not taking:   Discontinued 10/07/20 1825   bisoprolol (ZEBETA) 5 MG tablet [id number] Take 0.5 tablets (2.5 mg total) by mouth every Monday, Wednesday, and Friday. FLaurey Morale MD Taking Active Self           Med Note (GARNER, TIFFANY L   Tue Jan 07, 2021  4:30 PM)    Calcium Citrate-Vitamin D (CALCIUM CITRATE +D PO) [id number] Take 1 tablet by mouth daily.  [provider] Taking Active Self  carboxymethylcellulose (REFRESH PLUS) 0.5 % SOLN [id number] Place 1 drop into both eyes daily as needed (dry eyes).  [provider] Taking Active Self  cyanocobalamin 1000 MCG tablet [id number] Take 1,000 mcg by mouth daily.  [provider] Taking Active Self  fexofenadine (ALLEGRA) 180 MG tablet [id number] Take 1 tablet (180 mg total) by mouth daily. FLaurey Morale MD Taking Active Self           Med Note (GARNER, TIFFANY L   Tue Jan 07, 2021  4:31 PM)    Fluticasone-Umeclidin-Vilant (TRELEGY ELLIPTA) 100-62.5-25 MCG/INH AEPB [id number] Inhale 1 puff into the lungs daily. OLaurin Coder MD Taking Active   insulin glargine (LANTUS SOLOSTAR) 100 UNIT/ML Solostar Pen [id number] Inject 10 Units into the skin daily. Panosh, WStandley Brooking MD Taking Active   Insulin Pen Needle (PEN NEEDLES) 32G X 5 MM MISC [id number] 1 Device by Does not apply route daily at 2 PM. Panosh, WStandley Brooking MD Taking Active   Ipratropium-Albuterol (COMBIVENT) 20-100 MCG/ACT AERS respimat [id number] Inhale 1 puff into the lungs 4 (four) times daily as needed for wheezing. OLaurin Coder MD Taking Active   metFORMIN (GLUCOPHAGE)  500 MG tablet [id number] No Take 1 tablet (500 mg total) by mouth daily with breakfast.  Patient not  taking: Reported on 04/11/2021   [name], MD Not Taking Active Self           Med Note (GARNER, TIFFANY L   Tue Jan 07, 2021  4:31 PM)    nitroGLYCERIN (NITROSTAT) 0.4 MG SL tablet [id number] Yes Place 1 tablet (0.4 mg total) under the tongue every 5 (five) minutes as needed for chest pain. [name], MD Taking Active Self           Med Note (GARNER, TIFFANY L   Tue Jan 07, 2021  4:31 PM)    Vitamin E 400 units TABS [id number] Yes Take 400 Units by mouth daily. [provider] Taking Active   Zinc 100 MG TABS [id number] Yes Take 100 mg by mouth daily. [provider] Taking Active             Patient Active Problem List   Diagnosis Date Noted   Headache 01/06/2021   Anemia    Anxiety    Cardiac arrhythmia    COPD (chronic obstructive pulmonary disease) ([place])    Pneumonia    Skin cancer    Pneumonia due to COVID-19 virus 10/17/2020   Acute respiratory failure due to COVID-19 ([place]) 10/17/2020   COVID-19 virus infection 10/17/2020   Other chronic pain 09/02/2020   Chronic nonintractable headache 09/02/2020   Trigeminal neuralgia 07/26/2020   Body mass index (BMI) 26.0-26.9, adult 02/16/2020   Cervical spondylosis 02/16/2020   Elevated blood-pressure reading, without diagnosis of hypertension 02/16/2020   Neck pain 02/16/2020   Mixed dyslipidemia 01/12/2020   Elevated LFTs 01/12/2020   Right groin pain 06/16/2019   Abnormal nuclear stress test 06/02/2019   Angina pectoris ([place]) 06/02/2019   Frequent headaches 04/14/2018   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 He has not noticed any consistent trends. He was also afraid to inject the 60 units of Lantus so he did not and has not been using regularly. He is using the Novolog mix more regularly. He has been injecting 10 units whenever he is having a higher number and not necessarily before meals. He did bottom out a few times and was unsure what to do or when to give the insulin during these circumstances. He was unaware that there were different blood sugar goals for different times of the day and was aiming for 90-140 throughout the entire day. He also did not know the differences between the types of insulins he was prescribed.  He has made significant changes to his diet since starting the insulin and has been paying close attention to the amount of sweets has has been having. He cut out soft drinks completely and is only eating a small amount of carbs. His meal and sleep schedule is also not regimented and tends to vary from day to day. He has not  been injecting his insulin on a schedule either.  Patient is also under a significant amount of stress lately, especially with car issues. Two of his cars broke down and he has a truck as well that he just got parts to fix it with. He is struggling financially. Discussed income requirements for patient assistance and plan to send for patient to fill out.  Patient also noticed a red patch/rash on his back over the last month or two. He wasn't sure what this was from and inquired if it could be from the insulin. He does not know when this started in relation to starting the insulin.  Conditions to be addressed/monitored:  Hyperlipidemia, Diabetes, Coronary Artery Disease, GERD, and COPD  Conditions addressed this  visit: Diabetes, COPD  Care Plan : CCM Pharmacy Care Plan  Updates made by Viona Gilmore, [place] since 05/21/2021 12:00 AM     Problem: Problem: Hyperlipidemia, Diabetes, Coronary Artery Disease, GERD, and COPD      Long-Range Goal: Patient-Specific Goal   Start Date: 05/20/2021  Expected End Date: 05/20/2022  This Visit's Progress: On track  Priority: High  Note:   Current Barriers:  Unable to independently afford treatment regimen Unable to achieve control of diabetes   Pharmacist Clinical Goal(s):  Patient will achieve control of diabetes as evidenced by A1c  through collaboration with PharmD and provider.   Interventions: 1:1 collaboration with [name], MD regarding development and update of comprehensive plan of care as evidenced by provider attestation and co-signature Inter-disciplinary care team collaboration (see longitudinal plan of care) Comprehensive medication review performed; medication list updated in electronic medical record  Hyperlipidemia: (LDL goal < 70) -Uncontrolled -Current treatment: No medications -Medications previously tried: statin (elevated LFTs)  -Current dietary patterns: did not discuss -Current exercise habits: more active lately -Educated on Cholesterol goals;  -Counseled on diet and exercise extensively Will plan to readdress the need for statin at next visit.  Diabetes (A1c goal <7%) -Uncontrolled -Current medications: Lantus inject 60 units at bedtime - not consistently injecting Novolog Mix inject 10 units twice daily with meals -Medications previously tried: metformin (stopped on his own)  -Current home glucose readings fasting glucose: 163, 168, 119, 140-150 post prandial glucose: 269 -Reports hypoglycemic/hyperglycemic symptoms -Current meal patterns:  breakfast: sometimes skips lunch: vegetables and meat  dinner: vegetables and meat - steak, tossed salad and a bite of banana pudding snacks: not often drinks: quit  drinking soft drinks -Current exercise: more active lately -Educated on A1c and blood sugar goals; Proper insulin injection technique; Benefits of routine self-monitoring of blood sugar; Carbohydrate counting and/or plate method -Counseled to check feet daily and get yearly eye exams -Counseled on diet and exercise extensively Counseled on differences between short and long acting insulin and best times to inject. Recommended switching Novolog mix to short acting insulin to avoid use of 2 basal insulins  CAD (Goal: prevent heart events) -Not ideally controlled -Current treatment  Aspirin 81 mg 1 tablet daily -Medications previously tried: none  -Recommended to continue current medication   COPD (Goal: control symptoms and prevent exacerbations) -Not ideally controlled -Current treatment  Combivent 20-100 1 puff four times daily as needed -Medications previously tried: Trelegy (did not help)  -Gold Grade: Gold 1 (FEV1>80%) -Current COPD Classification:  B (high sx, <2 exacerbations/yr) -MMRC/CAT score: n/a -Pulmonary function testing: 02/17/21 -Exacerbations requiring treatment in last 6 months: none -Patient denies consistent use of maintenance inhaler -Frequency of rescue inhaler use: n/a -Counseled on When to  use rescue inhaler -Recommended to continue current medication   Health Maintenance -Vaccine gaps: COVID, shingrix, prevnar -Current therapy:  Vitamin C 100 mg daily Allegra 180 mg 1 tablet daily Calcium citrate & D daily Zinc 100 mg 1 tablet daily Vitamin E 400 units 1 tablet daily -Educated on Cost vs benefit of each product must be carefully weighed by individual consumer -Patient is satisfied with current therapy and denies issues -Recommended to continue current medication  Patient Goals/Self-Care Activities Patient will:  - take medications as prescribed check glucose 2-3 times daily, document, and provide at future appointments target a minimum of 150  minutes of moderate intensity exercise weekly  Follow Up Plan: Telephone follow up appointment with care management team member scheduled for: The care management team will reach out to the patient again over the next 14 days.       Medication Assistance: Application for [place] and Novolog  medication assistance program. in process.  Anticipated assistance start date 06/22/21.  See plan of care for additional detail.  Compliance/Adherence/Medication fill history: Care Gaps: COVID vaccine, shingrix, prevnar, eye exam, foot exam, hep C screening, urine microalbumin  Star-Rating Drugs: None (stopped metformin and atorvastatin)  Patient's preferred pharmacy is:  [facility] ([facility]), [place] - [place] [address] [place] [address] Phone: [phone] Fax: [phone]  [place] [address] [place] - [place] [address] Phone: [phone] Fax: [phone]  [facility] - [place], [place] [address] Phone: [phone] Fax: [phone]  Uses pill box? Yes Pt endorses 99% compliance  We discussed: Current pharmacy is preferred with insurance plan and patient is satisfied with pharmacy services Patient decided to: Continue current medication management strategy  Care Plan and Follow Up Patient Decision:  Patient agrees to Care Plan and Follow-up.  Plan: The care management team will reach out to the patient again over the next 14 days.  Jeni Salles, PharmD, [place] Pharmacist [place] [facility] [place]

## 2021-05-21 NOTE — Patient Instructions (Signed)
Hi Kadin,  It was great to get to meet you over the telephone! I will reach out to you once I hear back from Dr. Sarajane Jews.  Please reach out to me if you have any questions or need anything before our follow up!  Best, Maddie  Jeni Salles, PharmD, [place] Pharmacist [place] [facility] [place] [phone]

## 2021-05-23 ENCOUNTER — Inpatient Hospital Stay: Payer: Medicare HMO | Attending: Hematology and Oncology

## 2021-05-23 ENCOUNTER — Other Ambulatory Visit: Payer: Self-pay | Admitting: Hematology and Oncology

## 2021-05-23 ENCOUNTER — Other Ambulatory Visit: Payer: Self-pay

## 2021-05-23 ENCOUNTER — Inpatient Hospital Stay: Payer: Medicare HMO | Admitting: Hematology and Oncology

## 2021-05-23 VITALS — BP 126/96 | HR 62 | Temp 98.6°F | Resp 18 | Wt 157.3 lb

## 2021-05-23 DIAGNOSIS — D72829 Elevated white blood cell count, unspecified: Secondary | ICD-10-CM | POA: Insufficient documentation

## 2021-05-23 DIAGNOSIS — Z8042 Family history of malignant neoplasm of prostate: Secondary | ICD-10-CM | POA: Insufficient documentation

## 2021-05-23 DIAGNOSIS — Z87891 Personal history of nicotine dependence: Secondary | ICD-10-CM | POA: Insufficient documentation

## 2021-05-23 DIAGNOSIS — Z8616 Personal history of COVID-19: Secondary | ICD-10-CM | POA: Insufficient documentation

## 2021-05-23 DIAGNOSIS — D471 Chronic myeloproliferative disease: Secondary | ICD-10-CM

## 2021-05-23 DIAGNOSIS — Z800 Family history of malignant neoplasm of digestive organs: Secondary | ICD-10-CM | POA: Insufficient documentation

## 2021-05-23 DIAGNOSIS — Z7982 Long term (current) use of aspirin: Secondary | ICD-10-CM | POA: Insufficient documentation

## 2021-05-23 DIAGNOSIS — D72825 Bandemia: Secondary | ICD-10-CM | POA: Diagnosis not present

## 2021-05-23 LAB — CBC WITH DIFFERENTIAL (CANCER CENTER ONLY)
Abs Immature Granulocytes: 0.17 10*3/uL — ABNORMAL HIGH (ref 0.00–0.07)
Basophils Absolute: 0.4 10*3/uL — ABNORMAL HIGH (ref 0.0–0.1)
Basophils Relative: 2 %
Eosinophils Absolute: 0.8 10*3/uL — ABNORMAL HIGH (ref 0.0–0.5)
Eosinophils Relative: 4 %
HCT: 41.7 % (ref 39.0–52.0)
Hemoglobin: 13.7 g/dL (ref 13.0–17.0)
Immature Granulocytes: 1 %
Lymphocytes Relative: 17 %
Lymphs Abs: 3 10*3/uL (ref 0.7–4.0)
MCH: 30.6 pg (ref 26.0–34.0)
MCHC: 32.9 g/dL (ref 30.0–36.0)
MCV: 93.1 fL (ref 80.0–100.0)
Monocytes Absolute: 1 10*3/uL (ref 0.1–1.0)
Monocytes Relative: 5 %
Neutro Abs: 12.6 10*3/uL — ABNORMAL HIGH (ref 1.7–7.7)
Neutrophils Relative %: 71 %
Platelet Count: 381 10*3/uL (ref 150–400)
RBC: 4.48 MIL/uL (ref 4.22–5.81)
RDW: 17.7 % — ABNORMAL HIGH (ref 11.5–15.5)
WBC Count: 17.9 10*3/uL — ABNORMAL HIGH (ref 4.0–10.5)
nRBC: 0 % (ref 0.0–0.2)

## 2021-05-23 LAB — CMP (CANCER CENTER ONLY)
ALT: 55 U/L — ABNORMAL HIGH (ref 0–44)
AST: 40 U/L (ref 15–41)
Albumin: 3.5 g/dL (ref 3.5–5.0)
Alkaline Phosphatase: 147 U/L — ABNORMAL HIGH (ref 38–126)
Anion gap: 7 (ref 5–15)
BUN: 12 mg/dL (ref 8–23)
CO2: 27 mmol/L (ref 22–32)
Calcium: 9.5 mg/dL (ref 8.9–10.3)
Chloride: 106 mmol/L (ref 98–111)
Creatinine: 0.89 mg/dL (ref 0.61–1.24)
GFR, Estimated: >60 mL/min (ref >60)
Glucose, Bld: 239 mg/dL — ABNORMAL HIGH (ref 70–99)
Potassium: 4.1 mmol/L (ref 3.5–5.1)
Sodium: 140 mmol/L (ref 135–145)
Total Bilirubin: 0.5 mg/dL (ref 0.3–1.2)
Total Protein: 6.7 g/dL (ref 6.5–8.1)

## 2021-05-23 NOTE — Progress Notes (Signed)
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 Lorenso Courier. WBC 16.4, Hgb 15.6, MCV 96.7, Plt 294. MPN panel returns JAK2 positive 6) 11/13/2020: WBC 16.0, Hgb 13.5, MCV 94.4, Plt 392  Interval History:  Bill [name] 80 y.o. male with medical history significant for JAK2 positive leukocytosis who presents for a follow up visit. The patient's last visit was on 11/13/2020. In the interim since the last visit he has no major changes in his health.  On exam today Bill [name] reports he has been well in the interim since her last visit.  He has been having some difficulties with elevated sugars which he is currently working with his PCP for.  He notes that he has not been having any residual symptoms as a result of his prior COVID infection.  He currently denies having any issues with fevers, chills, sweats, nausea, vomiting or diarrhea.  His weight has been stable and his appetite is been good.  A full 10 point ROS is listed below.  MEDICAL HISTORY:  Past Medical History:  Diagnosis Date   Abnormal nuclear stress test 06/02/2019   Acute respiratory failure due to COVID-19 [facility]) 10/17/2020   Anemia    Angina pectoris ([place]) 06/02/2019   Formatting of this note might be different from the original. 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 Abnormal EGD 8/17   Headache 3/70/4888   Helicobacter pylori gastritis 07/21/2016   Medicare annual wellness visit, initial 01/05/2017   Formatting of this note might be different from the original. 3/18   Mixed dyslipidemia 01/12/2020   Neck pain 02/16/2020   Other chronic pain 09/02/2020   Penicillin allergy 07/21/2016   Pneumonia    Pneumonia due to COVID-19 virus 10/17/2020   Pulmonary nodule 02/28/2014   Formatting of this note might be different from the original. RML, no change   Right groin pain 06/16/2019   Skin cancer    Trigeminal neuralgia 07/26/2020    SURGICAL HISTORY: Past Surgical History:  Procedure Laterality Date   APPENDECTOMY     back fusion     cataract surgery Left    CHOLECYSTECTOMY     COLONOSCOPY  2014   clear    LEFT HEART CATH AND CORONARY ANGIOGRAPHY N/A 06/09/2019   Procedure: LEFT HEART CATH AND CORONARY ANGIOGRAPHY;  Surgeon: Jettie Booze, MD;  Location: [facility];  Service: Cardiovascular;  Laterality: N/A;   SPINE SURGERY  1984   thoracic spine fusion     SOCIAL HISTORY: Social History   Socioeconomic History   Marital status: Significant Other    Spouse name: Not on file   Number of children: 3   Years of education: 7   Highest education level: Not on file  Occupational History   Occupation: retired    Fish farm manager: [employer].  Tobacco Use   Smoking status: Former    Packs/day: 1.50    Years: 64.00    Pack years: 96.00    Types: Cigarettes    Quit date: 2007    Years  since quitting: 15.5   Smokeless tobacco: Never  Vaping Use   Vaping Use: Never used  Substance and Sexual Activity   Alcohol use: No   Drug use: No   Sexual activity: Not on file  Other Topics Concern   Not on file  Social History Narrative   Lives with significant other in a 2 story home.  Has 3 children.  Retired.  Education: 7th grade.    Social Determinants of Health   Financial Resource Strain: Low Risk    Difficulty of Paying Living Expenses: Not hard at all  Food Insecurity: No Food Insecurity   Worried About Charity fundraiser in the Last Year: Never true   Arboriculturist in the Last Year: Never true  Transportation Needs: Not on file  Physical Activity: Insufficiently Active   Days of Exercise per Week: 1 day   Minutes of Exercise per Session: 20 min  Stress: No Stress Concern Present   Feeling of Stress : Not at all  Social Connections: Moderately Integrated   Frequency of Communication with Friends and Family: More than three times a week   Frequency of Social Gatherings with Friends and Family: More than three times a week   Attends Religious Services: 1 to 4 times per year   Active Member of [employer] or Organizations: No   Attends Music therapist: Never   Marital Status: Living with partner  Human resources officer Violence: Not on file    FAMILY HISTORY: Family History  Problem Relation Age of Onset   Colon cancer Mother    Diabetes Father    Heart disease Father    Stroke Father    Hypertension Father    Hyperlipidemia Father    Heart disease Brother    Pancreatic cancer Cousin        x 2   Prostate cancer Cousin    Heart disease Son     ALLERGIES:  is allergic to aspirin, oxycodone, penicillins, percocet [oxycodone-acetaminophen], ciprofloxacin, and prednisone.  MEDICATIONS:  Current Outpatient Medications  Medication Sig Dispense Refill   acetaminophen (TYLENOL) 325 MG tablet Take 2 tablets (650 mg total) by mouth every 6 (six) hours as  needed for mild pain (or Fever >/= 101).     Ascorbic Acid (VITAMIN C) 100 MG tablet Take 100 mg by mouth daily.     aspirin 81 MG tablet Take 81 mg by mouth daily.     bisoprolol (ZEBETA) 5 MG tablet Take 0.5 tablets (2.5 mg total) by mouth every Monday, Wednesday, and Friday. 45 tablet 1   Calcium Citrate-Vitamin D (CALCIUM CITRATE +D PO) Take 1 tablet by mouth daily.      carboxymethylcellulose (REFRESH PLUS) 0.5 % SOLN Place 1 drop into both eyes daily as needed (dry eyes).      cyanocobalamin 1000 MCG tablet Take 1,000 mcg by mouth daily.  fexofenadine (ALLEGRA) 180 MG tablet Take 1 tablet (180 mg total) by mouth daily. 90 tablet 2   Fluticasone-Umeclidin-Vilant (TRELEGY ELLIPTA) 100-62.5-25 MCG/INH AEPB Inhale 1 puff into the lungs daily. 1 each 0   insulin aspart protamine - aspart (NOVOLOG MIX 70/30 FLEXPEN) (70-30) 100 UNIT/ML FlexPen Inject 0.1 mLs (10 Units total) into the skin 2 (two) times daily with a meal. 15 mL 11   insulin glargine (LANTUS SOLOSTAR) 100 UNIT/ML Solostar Pen Inject 60 Units into the skin at bedtime. 15 mL 11   Insulin Pen Needle (PEN NEEDLES) 32G X 5 MM MISC 1 Device by Does not apply route daily at 2 PM. 100 each 0   Ipratropium-Albuterol (COMBIVENT) 20-100 MCG/ACT AERS respimat Inhale 1 puff into the lungs 4 (four) times daily as needed for wheezing. 4 g 1   metFORMIN (GLUCOPHAGE) 500 MG tablet Take 1 tablet (500 mg total) by mouth daily with breakfast. (Patient not taking: Reported on 04/11/2021) 90 tablet 2   nitroGLYCERIN (NITROSTAT) 0.4 MG SL tablet Place 1 tablet (0.4 mg total) under the tongue every 5 (five) minutes as needed for chest pain. 25 tablet 3   Vitamin E 400 units TABS Take 400 Units by mouth daily.     Zinc 100 MG TABS Take 100 mg by mouth daily.     No current facility-administered medications for this visit.    REVIEW OF SYSTEMS:   Constitutional: ( - ) fevers, ( - )  chills , ( - ) night sweats Eyes: ( - ) blurriness of vision, ( - )  double vision, ( - ) watery eyes Ears, nose, mouth, throat, and face: ( - ) mucositis, ( - ) sore throat Respiratory: ( - ) cough, ( - ) dyspnea, ( - ) wheezes Cardiovascular: ( - ) palpitation, ( - ) chest discomfort, ( - ) lower extremity swelling Gastrointestinal:  ( - ) nausea, ( - ) heartburn, ( - ) change in bowel habits Skin: ( - ) abnormal skin rashes Lymphatics: ( - ) new lymphadenopathy, ( - ) easy bruising Neurological: ( - ) numbness, ( - ) tingling, ( - ) new weaknesses Behavioral/Psych: ( - ) mood change, ( - ) new changes  All other systems were reviewed with the patient and are negative.  PHYSICAL EXAMINATION: ECOG PERFORMANCE STATUS: 0 - Asymptomatic  Vitals:   05/23/21 1503  BP: (!) 126/96  Pulse: 62  Resp: 18  Temp: 98.6 F (37 C)  SpO2: 92%    Filed Weights   05/23/21 1503  Weight: 157 lb 4.8 oz (71.4 kg)     GENERAL: well appearing elderly Caucasian male. alert, no distress and comfortable SKIN: skin color, texture, turgor are normal, no rashes or significant lesions EYES: conjunctiva are pink and non-injected, sclera clear LUNGS: clear to auscultation and percussion with normal breathing effort HEART: regular rate & rhythm and no murmurs and no lower extremity edema Musculoskeletal: no cyanosis of digits and no clubbing  PSYCH: alert & oriented x 3, fluent speech NEURO: no focal motor/sensory deficits  LABORATORY DATA:  I have reviewed the data as listed CBC Latest Ref Rng & Units 05/23/2021 11/13/2020 10/21/2020  WBC 4.0 - 10.5 K/uL 17.9(H) 16.0(H) 18.0(H)  Hemoglobin 13.0 - 17.0 g/dL 13.7 13.5 14.4  Hematocrit 39.0 - 52.0 % 41.7 40.1 43.2  Platelets 150 - 400 K/uL 381 392 303    CMP Latest Ref Rng & Units 05/23/2021 04/11/2021 11/13/2020  Glucose 70 - 99 mg/dL 239(H) 413(H)  258(H)  BUN 8 - 23 mg/dL _0 Creatinine 0.61 - 1.24 mg/dL 0.89 0.75 0.90  Sodium 135 - 145 mmol/L 140 132(L) 138  Potassium 3.5 - 5.1 mmol/L 4.1 4.1 3.7  Chloride 98 -  111 mmol/L 106 98 104  CO2 22 - 32 mmol/L _1 Calcium 8.9 - 10.3 mg/dL 9.5 9.2 8.9  Total Protein 6.5 - 8.1 g/dL 6.7 - 6.7  Total Bilirubin 0.3 - 1.2 mg/dL 0.5 - 0.4  Alkaline Phos 38 - 126 U/L 147(H) - 119  AST 15 - 41 U/L 40 - 21  ALT 0 - 44 U/L 55(H) - 36    RADIOGRAPHIC STUDIES: No results found.   ASSESSMENT & PLAN Bill [name] 80 y.o. male with medical history significant for JAK2 positive leukocytosis who presents for a follow up visit.  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  The exact myeloproliferative neoplasm is not clear at this time as the patient only has an elevation of white blood cell count with no elevation in his platelets or red blood cells.  Given this the patient does not require cytoreductive therapy and can be managed on baby aspirin alone, which he is already taking for his cardiac disease.  Technically in order to confirm the diagnosis of an MPN a bone marrow biopsy is required.  In this case it would confirm the diagnosis, however it would not change our management.  As his counts are currently within normal limits he would not require hydroxyurea or other treatments in order to lower the counts.  As such the bone marrow biopsy will be predominantly academic.  Myelofibrosis is also a consideration, however given that there is no disease altering treatment we could wait until there was a drop in his blood counts before considering the bone marrow biopsy.  Overall I think we can continue with observation in order to assure that the patient does not convert into a leukemia or a myelofibrosis with cytopenias.  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 He has an MPN, but the exact one is not clear based off the peripheral blood finds.  --a  bone marrow biopsy can be done to confirm a diagnosis, but with no elevated RBC or Plt counts we would not start cytoreductive therapy. The utility the bone marrow is for diagnosis only, it would not change the course of treatment --myelofibrosis is certainly on the differential, but as there is no treatment that alters the course of the disease it would be reasonable to hold until the patient began developing cytopenias before considering bone marrow biopsy.  Additionally has had remarkably stable counts for over a decade. --continue ASA 44m PO daily (patient takes for cardiac disease) --RTC in 12 months for continued monitoring   No orders of the defined types were placed in this encounter.  All questions were answered. The patient knows to call the clinic with any problems, questions or concerns.  A total of more than 30 minutes were spent on this encounter and over half of that time was spent on counseling and coordination of care as outlined above.   JLedell Peoples MD Department of Hematology/Oncology [facility]at [facility] CenterPhone: [phone]: [phone]: [email or web address]  05/26/2021 5:35 PM

## 2021-05-27 ENCOUNTER — Telehealth: Payer: Self-pay | Admitting: Family Medicine

## 2021-05-27 DIAGNOSIS — E119 Type 2 diabetes mellitus without complications: Secondary | ICD-10-CM

## 2021-05-27 NOTE — Telephone Encounter (Signed)
Done

## 2021-05-28 ENCOUNTER — Other Ambulatory Visit: Payer: Self-pay | Admitting: Internal Medicine

## 2021-05-28 NOTE — Telephone Encounter (Signed)
PT needs a refill of their Insulin Pen Needle (PEN NEEDLES) 32G X 5 MM MISC as they got 6 pens left.

## 2021-06-02 ENCOUNTER — Ambulatory Visit: Payer: Medicare HMO | Admitting: Primary Care

## 2021-06-11 ENCOUNTER — Other Ambulatory Visit: Payer: Self-pay

## 2021-06-11 ENCOUNTER — Ambulatory Visit (INDEPENDENT_AMBULATORY_CARE_PROVIDER_SITE_OTHER): Payer: Medicare HMO | Admitting: Endocrinology

## 2021-06-11 VITALS — BP 130/70 | HR 70 | Ht 67.0 in | Wt 162.2 lb

## 2021-06-11 DIAGNOSIS — Z794 Long term (current) use of insulin: Secondary | ICD-10-CM

## 2021-06-11 DIAGNOSIS — E119 Type 2 diabetes mellitus without complications: Secondary | ICD-10-CM | POA: Diagnosis not present

## 2021-06-11 LAB — POCT GLYCOSYLATED HEMOGLOBIN (HGB A1C): Hemoglobin A1C: 7.8 % — AB (ref 4.0–5.6)

## 2021-06-11 MED ORDER — LANTUS SOLOSTAR 100 UNIT/ML ~~LOC~~ SOPN: 20.0000 [IU] | PEN_INJECTOR | SUBCUTANEOUS | 3 refills | Status: DC

## 2021-06-11 NOTE — Patient Instructions (Signed)
good diet and exercise significantly improve the control of your diabetes.  please let me know if you wish to be referred to a dietician.  high blood sugar is very risky to your health.  you should see an eye doctor and dentist every year.  It is very important to get all recommended vaccinations.  Controlling your blood pressure and cholesterol drastically reduces the damage diabetes does to your body.  Those who smoke should quit.  Please discuss these with your doctor.  check your blood sugar twice a day.  vary the time of day when you check, between before the 3 meals, and at bedtime.  also check if you have symptoms of your blood sugar being too high or too low.  please keep a record of the readings and bring it to your next appointment here (or you can bring the meter itself).  You can write it on any piece of paper.  please call us sooner if your blood sugar goes below 70, or if most of your readings are over 200. Please stop taking the Novolog 70/30, and:  Change the Lantus to 20 units each morning.  Please come back for a follow-up appointment in 2 weeks.

## 2021-06-11 NOTE — Progress Notes (Signed)
Subjective:    Patient ID: Bill [name], male    DOB: Jun 19, 1941, 80 y.o.   MRN: [id number]  HPI pt is referred by Dr Sarajane Jews, for diabetes.  Pt states DM was dx'ed in 2019; he is unaware of any chronic complications; he has been on insulin since mid-2022; pt says his diet and exercise are good; he has never had pancreatitis, pancreatic surgery, severe hypoglycemia or DKA.  He has lost 30 lbs since 2021, but has since regained some of that.  He has mild memory loss.  He takes lantus 10 QHS, and PRN Novolog 70/30, 0-10 units BID, according to cbg.  He brings a record of his cbg's which I have reviewed today.  CBG varies from 149-223.  He says insulin requirement has been decreasing since 6/22 OV with dr Sarajane Jews, but has leveled off.   Past Medical History:  Diagnosis Date   Abnormal nuclear stress test 06/02/2019   Acute respiratory failure due to COVID-19 [facility]) 10/17/2020   Anemia    Angina pectoris ([place]) 06/02/2019   Formatting of this note might be different from the original. 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 Abnormal EGD 8/17   Headache [id number]   Helicobacter pylori gastritis 07/21/2016   Medicare annual wellness visit, initial 01/05/2017   Formatting of this note might be different from the original. 3/18   Mixed dyslipidemia 01/12/2020   Neck pain 02/16/2020   Other  chronic pain 09/02/2020   Penicillin allergy 07/21/2016   Pneumonia    Pneumonia due to COVID-19 virus 10/17/2020   Pulmonary nodule 02/28/2014   Formatting of this note might be different from the original. RML, no change   Right groin pain 06/16/2019   Skin cancer    Trigeminal neuralgia 07/26/2020    Past Surgical History:  Procedure Laterality Date   APPENDECTOMY     back fusion     cataract surgery Left    CHOLECYSTECTOMY     COLONOSCOPY  2014   clear    LEFT HEART CATH AND CORONARY ANGIOGRAPHY N/A 06/09/2019   Procedure: LEFT HEART CATH AND CORONARY ANGIOGRAPHY;  Surgeon: Jettie Booze, MD;  Location: [place] [place] [facility];  Service: Cardiovascular;  Laterality: N/A;   SPINE SURGERY  1984   thoracic spine fusion     Social History   Socioeconomic History   Marital status: Significant Other    Spouse name: Not on file   Number of children: 3   Years of education: 7   Highest education level: Not on file  Occupational History   Occupation: retired    Fish farm manager: [employer].  Tobacco Use   Smoking status: Former    Packs/day: 1.50    Years: 64.00    Pack years: 96.00    Types: Cigarettes    Quit date: 2007    Years since quitting: 15.6   Smokeless tobacco: Never  Vaping Use  Vaping Use: Never used  Substance and Sexual Activity   Alcohol use: No   Drug use: No   Sexual activity: Not on file  Other Topics Concern   Not on file  Social History Narrative   Lives with significant other in a 2 story home.  Has 3 children.  Retired.  Education: 7th grade.    Social Determinants of Health   Financial Resource Strain: Low Risk    Difficulty of Paying Living Expenses: Not hard at all  Food Insecurity: No Food Insecurity   Worried About Charity fundraiser in the Last Year: Never true   Arboriculturist in the Last Year: Never true  Transportation Needs: Not on file  Physical Activity: Insufficiently Active   Days of Exercise per Week: 1 day   Minutes  of Exercise per Session: 20 min  Stress: No Stress Concern Present   Feeling of Stress : Not at all  Social Connections: Moderately Integrated   Frequency of Communication with Friends and Family: More than three times a week   Frequency of Social Gatherings with Friends and Family: More than three times a week   Attends Religious Services: 1 to 4 times per year   Active Member of [employer] or Organizations: No   Attends Archivist Meetings: Never   Marital Status: Living with partner  Intimate Partner Violence: Not on file    Current Outpatient Medications on File Prior to Visit  Medication Sig Dispense Refill   acetaminophen (TYLENOL) 325 MG tablet Take 2 tablets (650 mg total) by mouth every 6 (six) hours as needed for mild pain (or Fever >/= 101).     Ascorbic Acid (VITAMIN C) 100 MG tablet Take 100 mg by mouth daily.     aspirin 81 MG tablet Take 81 mg by mouth daily.     bisoprolol (ZEBETA) 5 MG tablet Take 0.5 tablets (2.5 mg total) by mouth every Monday, Wednesday, and Friday. 45 tablet 1   Calcium Citrate-Vitamin D (CALCIUM CITRATE +D PO) Take 1 tablet by mouth daily.      carboxymethylcellulose (REFRESH PLUS) 0.5 % SOLN Place 1 drop into both eyes daily as needed (dry eyes).      cyanocobalamin 1000 MCG tablet Take 1,000 mcg by mouth daily.      fexofenadine (ALLEGRA) 180 MG tablet Take 1 tablet (180 mg total) by mouth daily. 90 tablet 2   Fluticasone-Umeclidin-Vilant (TRELEGY ELLIPTA) 100-62.5-25 MCG/INH AEPB Inhale 1 puff into the lungs daily. 1 each 0   Ipratropium-Albuterol (COMBIVENT) 20-100 MCG/ACT AERS respimat Inhale 1 puff into the lungs 4 (four) times daily as needed for wheezing. 4 g 1   metFORMIN (GLUCOPHAGE) 500 MG tablet Take 1 tablet (500 mg total) by mouth daily with breakfast. 90 tablet 2   nitroGLYCERIN (NITROSTAT) 0.4 MG SL tablet Place 1 tablet (0.4 mg total) under the tongue every 5 (five) minutes as needed for chest pain. 25 tablet 3   RELION PEN  NEEDLES 32G X 4 MM MISC USE DAILY AT 2PM 100 each 0   Vitamin E 400 units TABS Take 400 Units by mouth daily.     Zinc 100 MG TABS Take 100 mg by mouth daily.     [DISCONTINUED] atorvastatin (LIPITOR) 10 MG tablet Take 1 tablet (10 mg total) by mouth daily. (Patient not taking: No sig reported) 90 tablet 2   No current facility-administered medications on file prior to visit.    Allergies  Allergen Reactions  Aspirin     Takes ASA '81mg'$  at home.   Oxycodone Nausea Only    PERCOCET- weakness, dizziness, shaking, nausea   Penicillins Hives    Did it involve swelling of the face/tongue/throat, SOB, or low BP? No Did it involve sudden or severe rash/hives, skin peeling, or any reaction on the inside of your mouth or nose? Yes Did you need to seek medical attention at a hospital or doctor's office? Yes When did it last happen?      long time If all above answers are "NO", may proceed with cephalosporin use.    Percocet [Oxycodone-Acetaminophen]    Ciprofloxacin Rash   Prednisone Palpitations    Family History  Problem Relation Age of Onset   Colon cancer Mother    Diabetes Father    Heart disease Father    Stroke Father    Hypertension Father    Hyperlipidemia Father    Heart disease Brother    Pancreatic cancer Cousin        x 2   Prostate cancer Cousin    Heart disease Son     BP 130/70 (BP Location: Right Arm, Patient Position: Sitting, Cuff Size: Normal)   Pulse 70   Ht '5\' 7"'$  (1.702 m)   Wt 162 lb 3.2 oz (73.6 kg)   SpO2 96%   BMI 25.40 kg/m     Review of Systems denies sob, n/v, memory loss, and depression.      Objective:   Physical Exam Pulses: dorsalis pedis intact bilat.   MSK: no deformity of the feet CV: 1+ bilat leg edema, and bilat vv's Skin:  no ulcer on the feet.  normal color and temp on the feet. Neuro: sensation is intact to touch on the feet Ext: there is bilateral onychomycosis of the toenails.    Lab Results  Component Value Date    HGBA1C 7.8 (A) 06/11/2021   I have reviewed outside records, and summarized: Pt was noted to have elevated A1c, and referred here.  Insulin was increased due to elev cbg's     Assessment & Plan:  Insulin-requiring type 2 DM: uncontrolled.  We will need to take this complex situation in stages  Patient Instructions  good diet and exercise significantly improve the control of your diabetes.  please let me know if you wish to be referred to a dietician.  high blood sugar is very risky to your health.  you should see an eye doctor and dentist every year.  It is very important to get all recommended vaccinations.  Controlling your blood pressure and cholesterol drastically reduces the damage diabetes does to your body.  Those who smoke should quit.  Please discuss these with your doctor.  check your blood sugar twice a day.  vary the time of day when you check, between before the 3 meals, and at bedtime.  also check if you have symptoms of your blood sugar being too high or too low.  please keep a record of the readings and bring it to your next appointment here (or you can bring the meter itself).  You can write it on any piece of paper.  please call us sooner if your blood sugar goes below 70, or if most of your readings are over 200. Please stop taking the Novolog 70/30, and:  Change the Lantus to 20 units each morning.  Please come back for a follow-up appointment in 2 weeks.

## 2021-06-24 ENCOUNTER — Ambulatory Visit (INDEPENDENT_AMBULATORY_CARE_PROVIDER_SITE_OTHER): Payer: Medicare HMO | Admitting: Endocrinology

## 2021-06-24 ENCOUNTER — Other Ambulatory Visit: Payer: Self-pay

## 2021-06-24 VITALS — BP 128/86 | HR 77 | Ht 67.0 in | Wt 160.8 lb

## 2021-06-24 DIAGNOSIS — E119 Type 2 diabetes mellitus without complications: Secondary | ICD-10-CM | POA: Diagnosis not present

## 2021-06-24 DIAGNOSIS — Z794 Long term (current) use of insulin: Secondary | ICD-10-CM | POA: Diagnosis not present

## 2021-06-24 LAB — POCT GLYCOSYLATED HEMOGLOBIN (HGB A1C): Hemoglobin A1C: 7.2 % — AB (ref 4.0–5.6)

## 2021-06-24 NOTE — Progress Notes (Signed)
Subjective:    Patient ID: Bill [name], male    DOB: 1941/03/06, 80 y.o.   MRN: [id number]  HPI Pt returns for f/u of diabetes mellitus: DM type: Insulin-requiring type 2 Dx'ed: [id number] Complications: CAD Therapy: insulin since mid-2022 DKA: never Severe hypoglycemia: never Pancreatitis: never Pancreatic imaging: normal on 2020 CT SDOH: He has mild memory loss.  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  no cbg record, but states cbg's are vary from 105-219.   pt states he feels well in general.   Past Medical History:  Diagnosis Date   Abnormal nuclear stress test 06/02/2019   Acute respiratory failure due to COVID-19 [facility]) 10/17/2020   Anemia    Angina pectoris ([place]) 06/02/2019   Formatting of this note might be different from the original. 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 Abnormal EGD 8/17   Headache [id number]   Helicobacter pylori gastritis 07/21/2016   Medicare annual wellness visit, initial 01/05/2017   Formatting of this note might be different from the original. 3/18   Mixed dyslipidemia 01/12/2020   Neck pain 02/16/2020   Other chronic pain 09/02/2020   Penicillin allergy 07/21/2016   Pneumonia    Pneumonia due to COVID-19 virus 10/17/2020   Pulmonary nodule 02/28/2014   Formatting of this note  might be different from the original. RML, no change   Right groin pain 06/16/2019   Skin cancer    Trigeminal neuralgia 07/26/2020    Past Surgical History:  Procedure Laterality Date   APPENDECTOMY     back fusion     cataract surgery Left    CHOLECYSTECTOMY     COLONOSCOPY  2014   clear    LEFT HEART CATH AND CORONARY ANGIOGRAPHY N/A 06/09/2019   Procedure: LEFT HEART CATH AND CORONARY ANGIOGRAPHY;  Surgeon: Jettie Booze, MD;  Location: [facility];  Service: Cardiovascular;  Laterality: N/A;   SPINE SURGERY  1984   thoracic spine fusion     Social History   Socioeconomic History   Marital status: Significant Other    Spouse name: Not on file   Number of children: 3   Years of education: 7   Highest education level: Not on file  Occupational History   Occupation: retired    Fish farm manager: [employer].  Tobacco Use   Smoking status: Former    Packs/day: 1.50    Years: 64.00    Pack years: 96.00    Types: Cigarettes    Quit date: 2007    Years since quitting: 15.6   Smokeless tobacco: Never  Vaping Use   Vaping Use: Never used  Substance and Sexual Activity   Alcohol use: No   Drug use: No   Sexual activity: Not on file  Other Topics Concern   Not on file  Social History Narrative   Lives with  significant other in a 2 story home.  Has 3 children.  Retired.  Education: 7th grade.    Social Determinants of Health   Financial Resource Strain: Low Risk    Difficulty of Paying Living Expenses: Not hard at all  Food Insecurity: No Food Insecurity   Worried About Charity fundraiser in the Last Year: Never true   Arboriculturist in the Last Year: Never true  Transportation Needs: Not on file  Physical Activity: Insufficiently Active   Days of Exercise per Week: 1 day   Minutes of Exercise per Session: 20 min  Stress: No Stress Concern Present   Feeling of Stress : Not at all  Social Connections: Moderately Integrated   Frequency of  Communication with Friends and Family: More than three times a week   Frequency of Social Gatherings with Friends and Family: More than three times a week   Attends Religious Services: 1 to 4 times per year   Active Member of [employer] or Organizations: No   Attends Archivist Meetings: Never   Marital Status: Living with partner  Intimate Partner Violence: Not on file    Current Outpatient Medications on File Prior to Visit  Medication Sig Dispense Refill   acetaminophen (TYLENOL) 325 MG tablet Take 2 tablets (650 mg total) by mouth every 6 (six) hours as needed for mild pain (or Fever >/= 101).     Ascorbic Acid (VITAMIN C) 100 MG tablet Take 100 mg by mouth daily.     aspirin 81 MG tablet Take 81 mg by mouth daily.     bisoprolol (ZEBETA) 5 MG tablet Take 0.5 tablets (2.5 mg total) by mouth every Monday, Wednesday, and Friday. 45 tablet 1   Calcium Citrate-Vitamin D (CALCIUM CITRATE +D PO) Take 1 tablet by mouth daily.      carboxymethylcellulose (REFRESH PLUS) 0.5 % SOLN Place 1 drop into both eyes daily as needed (dry eyes).      cyanocobalamin 1000 MCG tablet Take 1,000 mcg by mouth daily.      fexofenadine (ALLEGRA) 180 MG tablet Take 1 tablet (180 mg total) by mouth daily. 90 tablet 2   Fluticasone-Umeclidin-Vilant (TRELEGY ELLIPTA) 100-62.5-25 MCG/INH AEPB Inhale 1 puff into the lungs daily. 1 each 0   insulin glargine (LANTUS SOLOSTAR) 100 UNIT/ML Solostar Pen Inject 20 Units into the skin every morning. And pen needles 1/day 30 mL 3   Ipratropium-Albuterol (COMBIVENT) 20-100 MCG/ACT AERS respimat Inhale 1 puff into the lungs 4 (four) times daily as needed for wheezing. 4 g 1   nitroGLYCERIN (NITROSTAT) 0.4 MG SL tablet Place 1 tablet (0.4 mg total) under the tongue every 5 (five) minutes as needed for chest pain. 25 tablet 3   RELION PEN NEEDLES 32G X 4 MM MISC USE DAILY AT 2PM 100 each 0   Vitamin E 400 units TABS Take 400 Units by mouth daily.     Zinc 100 MG TABS Take 100  mg by mouth daily.     [DISCONTINUED] atorvastatin (LIPITOR) 10 MG tablet Take 1 tablet (10 mg total) by mouth daily. (Patient not taking: No sig reported) 90 tablet 2   No current facility-administered medications on file prior to visit.    Allergies  Allergen Reactions   Aspirin     Takes ASA '81mg'$  at home.   Oxycodone Nausea Only    PERCOCET- weakness, dizziness, shaking, nausea   Penicillins Hives    Did it involve swelling of the face/tongue/throat, SOB,  or low BP? No Did it involve sudden or severe rash/hives, skin peeling, or any reaction on the inside of your mouth or nose? Yes Did you need to seek medical attention at a hospital or doctor's office? Yes When did it last happen?      long time If all above answers are "NO", may proceed with cephalosporin use.    Percocet [Oxycodone-Acetaminophen]    Ciprofloxacin Rash   Prednisone Palpitations    Family History  Problem Relation Age of Onset   Colon cancer Mother    Diabetes Father    Heart disease Father    Stroke Father    Hypertension Father    Hyperlipidemia Father    Heart disease Brother    Pancreatic cancer Cousin        x 2   Prostate cancer Cousin    Heart disease Son     BP 128/86 (BP Location: Right Arm, Patient Position: Sitting, Cuff Size: Normal)   Pulse 77   Ht '5\' 7"'$  (1.702 m)   Wt 160 lb 12.8 oz (72.9 kg)   SpO2 96%   BMI 25.18 kg/m    Review of Systems He denies hypoglycemia.      Objective:   Physical Exam Pulses: dorsalis pedis intact bilat.   MSK: no deformity of the feet CV: 2+ bilat leg edema, and bilat vv's Skin:  no ulcer on the feet.  normal color and temp on the feet.  Neuro: sensation is intact to touch on the feet.   Ext: there is bilateral onychomycosis of the toenails.    Lab Results  Component Value Date   HGBA1C 7.2 (A) 06/24/2021       Assessment & Plan:  Insulin-requiring type 2 DM: this is the best control this pt should aim for, given memory loss, and this  regimen, which does match insulin to his changing needs throughout the day Lean body habitus: we discussed. he is not a candidate for tapering off insulin.    Patient Instructions  check your blood sugar twice a day.  vary the time of day when you check, between before the 3 meals, and at bedtime.  also check if you have symptoms of your blood sugar being too high or too low.  please keep a record of the readings and bring it to your next appointment here (or you can bring the meter itself).  You can write it on any piece of paper.  please call us sooner if your blood sugar goes below 70, or if most of your readings are over 200.  Please continue the same Lantus. Please come back for a follow-up appointment in 2-3 months.

## 2021-06-24 NOTE — Patient Instructions (Addendum)
check your blood sugar twice a day.  vary the time of day when you check, between before the 3 meals, and at bedtime.  also check if you have symptoms of your blood sugar being too high or too low.  please keep a record of the readings and bring it to your next appointment here (or you can bring the meter itself).  You can write it on any piece of paper.  please call us sooner if your blood sugar goes below 70, or if most of your readings are over 200.  Please continue the same Lantus. Please come back for a follow-up appointment in 2-3 months.

## 2021-07-01 ENCOUNTER — Telehealth: Payer: Self-pay | Admitting: Family Medicine

## 2021-07-01 ENCOUNTER — Other Ambulatory Visit: Payer: Self-pay

## 2021-07-01 MED ORDER — BISOPROLOL FUMARATE 5 MG PO TABS: 2.5000 mg | ORAL_TABLET | ORAL | 1 refills | Status: DC

## 2021-07-01 NOTE — Telephone Encounter (Signed)
Patient is needing a refill of bisoprolol (ZEBETA) 5 MG tablet to be sent to [facility] ([facility] [place]   Patient says that he is needing the refill today because he is out of the medication and has been trying to get this refilled since Thursday.  Please advise.

## 2021-07-01 NOTE — Telephone Encounter (Signed)
Lvm for patient that medication has been sent to [facility] on [address].

## 2021-07-03 ENCOUNTER — Telehealth: Payer: Self-pay | Admitting: Pharmacist

## 2021-07-03 NOTE — Chronic Care Management (AMB) (Signed)
Chronic Care Management Pharmacy Assistant   Name: Bill [name]  MRN: [id number] DOB: [birth year]  Reason for Encounter: Disease State / Diabetes Assessment Call   Conditions to be addressed/monitored: DMII  Recent office visits:  None  Recent consult visits:  06-24-2021 [name], MD (Endocrinology) - Patient presented for Type 2 diabetes mellitus treated with insulin . Stopped Metformin 500 mg  06-11-2021 [name], MD (Endocrinology) - Patient presented for Type 2 diabetes mellitus treated with insulin. Stopped NovoLOG Mix 70/30 FlexPen Changed LANTUS SOLOSTAR to 20 units SUBQ every morning  05-23-2021 [name], MD (Hematology & Oncology) - Patient presented for myeloproliferative neoplasm. No medication changes.  Hospital visits:  None in previous 6 months  Medications: Outpatient Encounter Medications as of 07/03/2021  Medication Sig   acetaminophen (TYLENOL) 325 MG tablet Take 2 tablets (650 mg total) by mouth every 6 (six) hours as needed for mild pain (or Fever >/= 101).   Ascorbic Acid (VITAMIN C) 100 MG tablet Take 100 mg by mouth daily.   aspirin 81 MG tablet Take 81 mg by mouth daily.   bisoprolol (ZEBETA) 5 MG tablet Take 0.5 tablets (2.5 mg total) by mouth every Monday, Wednesday, and Friday.   Calcium Citrate-Vitamin D (CALCIUM CITRATE +D PO) Take 1 tablet by mouth daily.    carboxymethylcellulose (REFRESH PLUS) 0.5 % SOLN Place 1 drop into both eyes daily as needed (dry eyes).    cyanocobalamin 1000 MCG tablet Take 1,000 mcg by mouth daily.    fexofenadine (ALLEGRA) 180 MG tablet Take 1 tablet (180 mg total) by mouth daily.   Fluticasone-Umeclidin-Vilant (TRELEGY ELLIPTA) 100-62.5-25 MCG/INH AEPB Inhale 1 puff into the lungs daily.   insulin glargine (LANTUS SOLOSTAR) 100 UNIT/ML Solostar Pen Inject 20 Units into the skin every morning. And pen needles 1/day   Ipratropium-Albuterol (COMBIVENT) 20-100 MCG/ACT AERS respimat Inhale 1 puff into  the lungs 4 (four) times daily as needed for wheezing.   nitroGLYCERIN (NITROSTAT) 0.4 MG SL tablet Place 1 tablet (0.4 mg total) under the tongue every 5 (five) minutes as needed for chest pain.   RELION PEN NEEDLES 32G X 4 MM MISC USE DAILY AT 2PM   Vitamin E 400 units TABS Take 400 Units by mouth daily.   Zinc 100 MG TABS Take 100 mg by mouth daily.   [DISCONTINUED] atorvastatin (LIPITOR) 10 MG tablet Take 1 tablet (10 mg total) by mouth daily. (Patient not taking: No sig reported)   No facility-administered encounter medications on file as of 07/03/2021.  Recent Relevant Labs: Lab Results  Component Value Date/Time   HGBA1C 7.2 (A) 06/24/2021 03:27 PM   HGBA1C 7.8 (A) 06/11/2021 02:20 PM   HGBA1C 16.8 Repeated and verified X2. (H) 04/11/2021 11:19 AM   HGBA1C 7.9 (H) 10/18/2020 07:35 AM    Kidney Function Lab Results  Component Value Date/Time   CREATININE 0.89 05/23/2021 02:28 PM   CREATININE 0.75 04/11/2021 11:19 AM   CREATININE 0.90 11/13/2020 03:13 PM   GFR 86.39 04/11/2021 11:19 AM   GFRNONAA >60 05/23/2021 02:28 PM   GFRAA >60 05/09/2020 02:33 PM   GFRAA >60 04/19/2020 01:30 PM    Current antihyperglycemic regimen:  Lantus inject 60 units at bedtime  What recent interventions/DTPs have been made to improve glycemic control:  Patient reports none Have there been any recent hospitalizations or ED visits since last visit with CPP? None Patient denies hypoglycemic symptoms, including Pale, Sweaty, Shaky, Hungry, Nervous/irritable, and Vision changes Patient denies  hyperglycemic symptoms, including blurry vision, excessive thirst, fatigue, polyuria, and weakness How often are you checking your blood sugar? once daily What are your blood sugars ranging?  Fasting: 136 (07-07-21) During the week, how often does your blood glucose drop below 70? Never Are you checking your feet daily/regularly? Patient reports he is checking his feet and they are doing well  Adherence  Review: Is the patient currently on a STATIN medication? No Is the patient currently on ACE/ARB medication? No Does the patient have >5 day gap between last estimated fill dates? No  Notes: Patient reports that he is doing well, staying busy and active with his garden. He reports his tomatoes have not done good this year, He has a riding mower that he uses for the lawn and has been working on the [employer] of his vehicle. He reports he has been using his Lantus regularly having no issues or side effects, he reports no medication refills needed at this time and no pharmacy issues.  Care Gaps: COVID Vaccines - Overdue Eye Exam - Overdue Urine Micro - Overdue Hepatitis C Screening - Overdue Zoster Vaccine - Overdue PNA Vaccine - Overdue Flu Vaccine - Overdue AWV - Done 02-17-2021 CCM - Scheduled for 09-15-21  Star Rating Drugs: Atorvastatin (Lipitor) - Patient no longer taking  [name] Pharmacist Assistant [phone]

## 2021-07-04 ENCOUNTER — Other Ambulatory Visit: Payer: Self-pay

## 2021-07-04 ENCOUNTER — Telehealth: Payer: Self-pay | Admitting: Family Medicine

## 2021-07-04 ENCOUNTER — Telehealth: Payer: Self-pay | Admitting: Endocrinology

## 2021-07-04 DIAGNOSIS — E119 Type 2 diabetes mellitus without complications: Secondary | ICD-10-CM

## 2021-07-04 MED ORDER — ACCU-CHEK GUIDE W/DEVICE KIT: 1.0000 | PACK | Freq: Two times a day (BID) | 0 refills | Status: AC

## 2021-07-04 MED ORDER — ACCU-CHEK SOFTCLIX LANCETS MISC: 100.0000 | Freq: Two times a day (BID) | 12 refills | Status: AC

## 2021-07-04 MED ORDER — GLUCOSE BLOOD VI STRP: 100.0000 | ORAL_STRIP | Freq: Two times a day (BID) | 12 refills | Status: DC

## 2021-07-04 NOTE — Telephone Encounter (Signed)
Patient called re: Patient does not like the Trumetrix meter and states he will use his Accuchek Guide Meter that he has had for a while.   Patient requests a new Rx for Accucheck Guide Test Strips be sent to : [facility] ([address] [place] - [address] Phone:  [phone]  Fax:  [phone]

## 2021-07-04 NOTE — Telephone Encounter (Signed)
Is this okay to order?  

## 2021-07-04 NOTE — Telephone Encounter (Signed)
Patient called today saying that he is needing testing stripes for his Accucheck diabetic monitor. He says he was given a different monitor and that he is not able to get an accurate reading and would like testing stripes for his Accucheck to be sent into [facility] ([facility]), [place].  Please advise.

## 2021-07-04 NOTE — Telephone Encounter (Signed)
Sent meter  dm supplies to pharmacy

## 2021-07-04 NOTE — Telephone Encounter (Signed)
Called and spoke with patient about message.   Patient currently see Dr. Loanne Drilling for diabetes management.   Dr. Loanne Drilling office number given to patient, he stated he will direct this message to Dr. Loanne Drilling office.  Nothing further is needed.

## 2021-07-11 ENCOUNTER — Telehealth: Payer: Medicare HMO

## 2021-07-14 ENCOUNTER — Ambulatory Visit: Payer: Medicare HMO | Admitting: Cardiology

## 2021-08-04 ENCOUNTER — Encounter: Payer: Self-pay | Admitting: Family Medicine

## 2021-08-04 ENCOUNTER — Ambulatory Visit (INDEPENDENT_AMBULATORY_CARE_PROVIDER_SITE_OTHER): Payer: Medicare HMO | Admitting: Family Medicine

## 2021-08-04 ENCOUNTER — Other Ambulatory Visit: Payer: Self-pay

## 2021-08-04 VITALS — BP 110/74 | HR 66 | Temp 98.3°F | Wt 163.0 lb

## 2021-08-04 DIAGNOSIS — R102 Pelvic and perineal pain: Secondary | ICD-10-CM | POA: Diagnosis not present

## 2021-08-04 LAB — POC URINALSYSI DIPSTICK (AUTOMATED)
Bilirubin, UA: NEGATIVE
Blood, UA: NEGATIVE
Glucose, UA: NEGATIVE
Ketones, UA: NEGATIVE
Leukocytes, UA: NEGATIVE
Nitrite, UA: NEGATIVE
Protein, UA: NEGATIVE
Spec Grav, UA: 1.015 (ref 1.010–1.025)
Urobilinogen, UA: 0.2 E.U./dL
pH, UA: 6 (ref 5.0–8.0)

## 2021-08-04 LAB — PSA: PSA: 0.16 ng/mL (ref 0.10–4.00)

## 2021-08-04 NOTE — Addendum Note (Signed)
Addended by: Wyvonne Lenz on: 08/04/2021 03:20 PM   Modules accepted: Orders
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
## 2021-08-12 ENCOUNTER — Other Ambulatory Visit: Payer: Self-pay

## 2021-08-15 ENCOUNTER — Other Ambulatory Visit: Payer: Self-pay | Admitting: Family Medicine

## 2021-08-15 ENCOUNTER — Encounter: Payer: Self-pay | Admitting: Family Medicine

## 2021-08-15 ENCOUNTER — Other Ambulatory Visit: Payer: Self-pay

## 2021-08-15 ENCOUNTER — Ambulatory Visit (INDEPENDENT_AMBULATORY_CARE_PROVIDER_SITE_OTHER): Payer: Medicare HMO

## 2021-08-15 ENCOUNTER — Ambulatory Visit (INDEPENDENT_AMBULATORY_CARE_PROVIDER_SITE_OTHER): Payer: Medicare HMO | Admitting: Family Medicine

## 2021-08-15 VITALS — BP 118/70 | HR 70 | Temp 98.6°F | Wt 163.0 lb

## 2021-08-15 DIAGNOSIS — S161XXA Strain of muscle, fascia and tendon at neck level, initial encounter: Secondary | ICD-10-CM

## 2021-08-15 DIAGNOSIS — S20211A Contusion of right front wall of thorax, initial encounter: Secondary | ICD-10-CM | POA: Diagnosis not present

## 2021-08-15 DIAGNOSIS — M542 Cervicalgia: Secondary | ICD-10-CM | POA: Diagnosis not present

## 2021-08-15 MED ORDER — CYCLOBENZAPRINE HCL 10 MG PO TABS: 10.0000 mg | ORAL_TABLET | Freq: Three times a day (TID) | ORAL | 0 refills | Status: DC | PRN

## 2021-08-15 MED ORDER — LANTUS SOLOSTAR 100 UNIT/ML ~~LOC~~ SOPN: 20.0000 [IU] | PEN_INJECTOR | SUBCUTANEOUS | 3 refills | Status: DC
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
## 2021-08-25 ENCOUNTER — Other Ambulatory Visit: Payer: Self-pay

## 2021-08-25 ENCOUNTER — Ambulatory Visit: Payer: Medicare HMO | Admitting: Cardiology

## 2021-08-25 ENCOUNTER — Encounter: Payer: Self-pay | Admitting: Cardiology

## 2021-08-25 VITALS — BP 122/70 | HR 66 | Ht 67.0 in | Wt 163.0 lb

## 2021-08-25 DIAGNOSIS — E782 Mixed hyperlipidemia: Secondary | ICD-10-CM | POA: Diagnosis not present

## 2021-08-25 DIAGNOSIS — E119 Type 2 diabetes mellitus without complications: Secondary | ICD-10-CM

## 2021-08-25 DIAGNOSIS — I2510 Atherosclerotic heart disease of native coronary artery without angina pectoris: Secondary | ICD-10-CM

## 2021-08-25 NOTE — Patient Instructions (Signed)
Medication Instructions:  Your physician recommends that you continue on your current medications as directed. Please refer to the Current Medication list given to you today.  *If you need a refill on your cardiac medications before your next appointment, please call your pharmacy*   Lab Work: Your physician recommends that you return for lab work in: the next few days. You need to have labs done when you are fasting.  You can come Monday through Friday 8:30 am to 12:00 pm and 1:15 to 4:30. You do not need to make an appointment as the order has already been placed. The labs you are going to have done are LFT and Lipids.  If you have labs (blood work) drawn today and your tests are completely normal, you will receive your results only by: [place] (if you have MyChart) OR A paper copy in the mail If you have any lab test that is abnormal or we need to change your treatment, we will call you to review the results.   Testing/Procedures: None ordered   Follow-Up: At [facility], you and your health needs are our priority.  As part of our continuing mission to provide you with exceptional heart care, we have created designated Provider Care Teams.  These Care Teams include your primary Cardiologist (physician) and Advanced Practice Providers (APPs -  Physician Assistants and Nurse Practitioners) who all work together to provide you with the care you need, when you need it.  We recommend signing up for the patient portal called "MyChart".  Sign up information is provided on this After Visit Summary.  MyChart is used to connect with patients for Virtual Visits (Telemedicine).  Patients are able to view lab/test results, encounter notes, upcoming appointments, etc.  Non-urgent messages can be sent to your provider as well.   To learn more about what you can do with MyChart, go to [email or web address].    Your next appointment:   6 month(s)  The format for your next appointment:    In Person  Provider:   Jyl Heinz, MD   Other Instructions NA

## 2021-08-25 NOTE — Progress Notes (Signed)
Cardiology Office Note:    Date:  08/25/2021   ID:  Bill [name], DOB 1941/01/16, MRN [id number]  PCP:  [name], MD  Cardiologist:  Jenean Lindau, MD   Referring MD: [name], MD    ASSESSMENT:    1. Coronary artery disease involving native coronary artery of native heart without angina pectoris   2. Type 2 diabetes mellitus without complication, without long-term current use of insulin ([place])   3. Mixed dyslipidemia    PLAN:    In order of problems listed above:  Coronary artery disease: Secondary prevention stressed with the patient.  Importance of compliance with diet medication stressed and he vocalized understanding.  He was advised to walk at least half an hour a day 5 days a week and he promises to do so. Essential hypertension: Blood pressure stable and diet was emphasized. Diabetes mellitus mixed dyslipidemia: I discussed this with the patient at extensive length.  His last hemoglobin A1c was greater than 16.  I cautioned him about this.  He is sugars are followed by primary care provider.  He is not keen on lipid-lowering therapy but wants to think about it.  I called him in the next few days for fasting liver lipid check and will decide accordingly.  His wife also is very undecided about lipid-lowering therapy and I discussed this at length with them and benefits explained and risks also. Patient will be seen in follow-up appointment in 6 months or earlier if the patient has any concerns    Medication Adjustments/Labs and Tests Ordered: Current medicines are reviewed at length with the patient today.  Concerns regarding medicines are outlined above.  Orders Placed This Encounter  Procedures   Hepatic function panel   Lipid panel    No orders of the defined types were placed in this encounter.    Chief Complaint  Patient presents with   Follow-up     History of Present Illness:    Bill [name] is a 80 y.o. male.  Patient has past  medical history of coronary artery disease, essential hypertension and mixed dyslipidemia.  He has COPD.  He denies any problems at this time and takes care of activities of daily living.  No chest pain orthopnea or PND.  He is a diabetic.  He leads a sedentary lifestyle.  Past Medical History:  Diagnosis Date   Abnormal nuclear stress test 06/02/2019   Acute respiratory failure due to COVID-19 [facility]) 10/17/2020   Anemia    Angina pectoris ([place]) 06/02/2019   Formatting of this note might be different from the original. 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 Abnormal EGD 8/17   Headache 3/79/0240   Helicobacter pylori gastritis 07/21/2016   Medicare annual wellness visit, initial 01/05/2017   Formatting of this note might be different from the original. 3/18   Mixed dyslipidemia 01/12/2020   Neck pain 02/16/2020  Other chronic pain 09/02/2020   Penicillin allergy 07/21/2016   Pneumonia    Pneumonia due to COVID-19 virus 10/17/2020   Pulmonary nodule 02/28/2014   Formatting of this note might be different from the original. RML, no change   Right groin pain 06/16/2019   Skin cancer    Trigeminal neuralgia 07/26/2020   Type 2 diabetes mellitus without complication, without long-term current use of insulin ([place]) 04/11/2021    Past Surgical History:  Procedure Laterality Date    APPENDECTOMY     back fusion     cataract surgery Left    CHOLECYSTECTOMY     COLONOSCOPY  2014   clear    LEFT HEART CATH AND CORONARY ANGIOGRAPHY N/A 06/09/2019   Procedure: LEFT HEART CATH AND CORONARY ANGIOGRAPHY;  Surgeon: Jettie Booze, MD;  Location: [facility];  Service: Cardiovascular;  Laterality: N/A;   [place]   thoracic spine fusion     Current Medications: Current Meds  Medication Sig   Accu-Chek Softclix Lancets lancets 100 each by Other route 2 times daily at 12 noon and 4 pm. Use as instructed   acetaminophen (TYLENOL) 325 MG tablet Take 2 tablets (650 mg total) by mouth every 6 (six) hours as needed for mild pain (or Fever >/= 101).   Ascorbic Acid (VITAMIN C) 100 MG tablet Take 100 mg by mouth daily.   aspirin 81 MG tablet Take 81 mg by mouth daily.   bisoprolol (ZEBETA) 5 MG tablet TAKE 1/2 TABLET (2.5 MG) BY MOUTH EVERY MONDAY, WEDNESDAY AND FRIDAY.   Blood Glucose Monitoring Suppl (ACCU-CHEK GUIDE) w/Device KIT 1 kit by Other route 2 (two) times daily after a meal.   Calcium Citrate-Vitamin D (CALCIUM CITRATE +D PO) Take 1 tablet by mouth daily.    cyanocobalamin 1000 MCG tablet Take 1,000 mcg by mouth daily.    cyclobenzaprine (FLEXERIL) 10 MG tablet Take 1 tablet (10 mg total) by mouth 3 (three) times daily as needed for muscle spasms.   fexofenadine (ALLEGRA) 180 MG tablet Take 1 tablet (180 mg total) by mouth daily.   Fluticasone-Umeclidin-Vilant (TRELEGY ELLIPTA) 100-62.5-25 MCG/INH AEPB Inhale 1 puff into the lungs daily.   glucose blood test strip 100 each by Other route 2 times daily at 12 noon and 4 pm. Use as instructed   insulin glargine (LANTUS SOLOSTAR) 100 UNIT/ML Solostar Pen Inject 20 Units into the skin every morning. And pen needles 1/day   Ipratropium-Albuterol (COMBIVENT) 20-100 MCG/ACT AERS respimat Inhale 1 puff into the lungs 4 (four) times daily as needed for wheezing.   nitroGLYCERIN (NITROSTAT) 0.4 MG SL tablet  Place 1 tablet (0.4 mg total) under the tongue every 5 (five) minutes as needed for chest pain.   RELION PEN NEEDLES 32G X 4 MM MISC USE DAILY AT 2PM   Vitamin E 400 units TABS Take 400 Units by mouth daily.   Zinc 100 MG TABS Take 100 mg by mouth daily.     Allergies:   Aspirin, Oxycodone, Penicillins, Percocet [oxycodone-acetaminophen], Ciprofloxacin, and Prednisone   Social History   Socioeconomic History   Marital status: Significant Other    Spouse name: Not on file   Number of children: 3   Years of education: 7   Highest education level: Not on file  Occupational History   Occupation: retired    Fish farm manager: [employer].  Tobacco Use   Smoking status: Former    Packs/day: 1.50    Years: 64.00  Pack years: 96.00    Types: Cigarettes    Quit date: 2007    Years since quitting: 15.8   Smokeless tobacco: Never  Vaping Use   Vaping Use: Never used  Substance and Sexual Activity   Alcohol use: No   Drug use: No   Sexual activity: Not on file  Other Topics Concern   Not on file  Social History Narrative   Lives with significant other in a 2 story home.  Has 3 children.  Retired.  Education: 7th grade.    Social Determinants of Health   Financial Resource Strain: Low Risk    Difficulty of Paying Living Expenses: Not hard at all  Food Insecurity: No Food Insecurity   Worried About Charity fundraiser in the Last Year: Never true   Arboriculturist in the Last Year: Never true  Transportation Needs: Not on file  Physical Activity: Insufficiently Active   Days of Exercise per Week: 1 day   Minutes of Exercise per Session: 20 min  Stress: No Stress Concern Present   Feeling of Stress : Not at all  Social Connections: Moderately Integrated   Frequency of Communication with Friends and Family: More than three times a week   Frequency of Social Gatherings with Friends and Family: More than three times a week   Attends Religious Services: 1 to 4 times per year    Active Member of [employer] or Organizations: No   Attends Music therapist: Never   Marital Status: Living with partner     Family History: The patient's family history includes Colon cancer in his mother; Diabetes in his father; Heart disease in his brother, father, and son; Hyperlipidemia in his father; Hypertension in his father; Pancreatic cancer in his cousin; Prostate cancer in his cousin; Stroke in his father.  ROS:   Please see the history of present illness.    All other systems reviewed and are negative.  EKGs/Labs/Other Studies Reviewed:    The following studies were reviewed today: I discussed my findings with the patient at length.   Recent Labs: 05/23/2021: ALT 55; BUN 12; Creatinine 0.89; Hemoglobin 13.7; Platelet Count 381; Potassium 4.1; Sodium 140  Recent Lipid Panel    Component Value Date/Time   CHOL 182 04/16/2020 1451   CHOL 170 01/10/2020 1549   TRIG 63 10/17/2020 0158   HDL 39.70 04/16/2020 1451   HDL 42 01/10/2020 1549   CHOLHDL 5 04/16/2020 1451   VLDL 25.6 04/16/2020 1451   LDLCALC 117 (H) 04/16/2020 1451   LDLCALC 110 (H) 01/10/2020 1549    Physical Exam:    VS:  BP 122/70 (BP Location: Right Arm, Patient Position: Sitting, Cuff Size: Normal)   Pulse 66   Ht _0  (1.702 m)   Wt 163 lb (73.9 kg)   SpO2 95%   BMI 25.53 kg/m     Wt Readings from Last 3 Encounters:  08/25/21 163 lb (73.9 kg)  08/15/21 163 lb (73.9 kg)  08/04/21 163 lb (73.9 kg)     GEN: Patient is in no acute distress HEENT: Normal NECK: No JVD; No carotid bruits LYMPHATICS: No lymphadenopathy CARDIAC: Hear sounds regular, 2/6 systolic murmur at the apex. RESPIRATORY:  Clear to auscultation without rales, wheezing or rhonchi  ABDOMEN: Soft, non-tender, non-distended MUSCULOSKELETAL:  No edema; No deformity  SKIN: Warm and dry NEUROLOGIC:  Alert and oriented x 3 PSYCHIATRIC:  Normal affect   Signed, Jenean Lindau, MD  08/25/2021 4:36  PM    [facility]

## 2021-08-27 ENCOUNTER — Ambulatory Visit (INDEPENDENT_AMBULATORY_CARE_PROVIDER_SITE_OTHER): Payer: Medicare HMO | Admitting: Endocrinology

## 2021-08-27 ENCOUNTER — Other Ambulatory Visit: Payer: Self-pay

## 2021-08-27 VITALS — BP 120/74 | HR 60 | Ht 67.0 in | Wt 165.0 lb

## 2021-08-27 DIAGNOSIS — E119 Type 2 diabetes mellitus without complications: Secondary | ICD-10-CM

## 2021-08-27 DIAGNOSIS — Z794 Long term (current) use of insulin: Secondary | ICD-10-CM

## 2021-08-27 LAB — POCT GLYCOSYLATED HEMOGLOBIN (HGB A1C): Hemoglobin A1C: 6.9 % — AB (ref 4.0–5.6)

## 2021-08-27 MED ORDER — LANTUS SOLOSTAR 100 UNIT/ML ~~LOC~~ SOPN: 18.0000 [IU] | PEN_INJECTOR | SUBCUTANEOUS | 3 refills | Status: DC

## 2021-08-27 NOTE — Patient Instructions (Addendum)
check your blood sugar twice a day.  vary the time of day when you check, between before the 3 meals, and at bedtime.  also check if you have symptoms of your blood sugar being too high or too low.  please keep a record of the readings and bring it to your next appointment here (or you can bring the meter itself).  You can write it on any piece of paper.  please call us sooner if your blood sugar goes below 70, or if most of your readings are over 200.  Please reduce the Lantus to 18 units each morning.   Please come back for a follow-up appointment in 3 months.

## 2021-08-27 NOTE — Progress Notes (Signed)
Subjective:    Patient ID: Bill [name], male    DOB: 1941-03-01, 80 y.o.   MRN: [id number]  HPI Pt returns for f/u of diabetes mellitus: DM type: Insulin-requiring type 2 Dx'ed: 8016 Complications: CAD Therapy: insulin since mid-2022 DKA: never Severe hypoglycemia: never Pancreatitis: never Pancreatic imaging: normal on 2020 CT SDOH: He has mild memory loss.  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  Interval history: He takes Lantus as rx'ed.  He brings a record of his cbg's which I have reviewed today.  Cbg varies from 128-168.   He checks fasting only.  pt states he feels well in general.   Past Medical History:  Diagnosis Date   Abnormal nuclear stress test 06/02/2019   Acute respiratory failure due to COVID-19 [facility]) 10/17/2020   Anemia    Angina pectoris ([place]) 06/02/2019   Formatting of this note might be different from the original. Normal cath 10/98, 3/16   Anxiety    Body mass index (BMI) 26.0-26.9, adult 02/16/2020   CAD (coronary artery disease) 07/21/2016   Cardiac arrhythmia    Cervical spondylosis 02/16/2020   Chronic nonintractable headache 09/02/2020   COPD (chronic obstructive pulmonary disease) (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 Abnormal EGD 8/17   Headache 5/53/7482   Helicobacter pylori gastritis 07/21/2016   Medicare annual wellness visit, initial 01/05/2017   Formatting of this note might be different from the original. 3/18   Mixed dyslipidemia 01/12/2020   Neck pain 02/16/2020   Other chronic pain 09/02/2020   Penicillin allergy 07/21/2016   Pneumonia    Pneumonia due to COVID-19 virus  10/17/2020   Pulmonary nodule 02/28/2014   Formatting of this note might be different from the original. RML, no change   Right groin pain 06/16/2019   Skin cancer    Trigeminal neuralgia 07/26/2020   Type 2 diabetes mellitus without complication, without long-term current use of insulin ([place]) 04/11/2021    Past Surgical History:  Procedure Laterality Date   APPENDECTOMY     back fusion     cataract surgery Left    CHOLECYSTECTOMY     COLONOSCOPY  2014   clear    LEFT HEART CATH AND CORONARY ANGIOGRAPHY N/A 06/09/2019   Procedure: LEFT HEART CATH AND CORONARY ANGIOGRAPHY;  Surgeon: Jettie Booze, MD;  Location: [facility];  Service: Cardiovascular;  Laterality: N/A;   SPINE SURGERY  1984   thoracic spine fusion     Social History   Socioeconomic History   Marital status: Significant Other    Spouse name: Not on file   Number of children: 3   Years of education: 7   Highest education level: Not on file  Occupational History   Occupation: retired    Fish farm manager: [place].  Tobacco Use   Smoking status: Former    Packs/day: 1.50    Years: 64.00    Pack years: 96.00    Types: Cigarettes    Quit date: 2007    Years since quitting: 15.8   Smokeless tobacco: Never  Vaping Use   Vaping Use: Never used  Substance and Sexual Activity   Alcohol use: No  Drug use: No   Sexual activity: Not on file  Other Topics Concern   Not on file  Social History Narrative   Lives with significant other in a 2 story home.  Has 3 children.  Retired.  Education: 7th grade.    Social Determinants of Health   Financial Resource Strain: Low Risk    Difficulty of Paying Living Expenses: Not hard at all  Food Insecurity: No Food Insecurity   Worried About Charity fundraiser in the Last Year: Never true   Arboriculturist in the Last Year: Never true  Transportation Needs: Not on file  Physical Activity: Insufficiently Active   Days of Exercise per Week: 1 day    Minutes of Exercise per Session: 20 min  Stress: No Stress Concern Present   Feeling of Stress : Not at all  Social Connections: Moderately Integrated   Frequency of Communication with Friends and Family: More than three times a week   Frequency of Social Gatherings with Friends and Family: More than three times a week   Attends Religious Services: 1 to 4 times per year   Active Member of [employer] or Organizations: No   Attends Archivist Meetings: Never   Marital Status: Living with partner  Intimate Partner Violence: Not on file    Current Outpatient Medications on File Prior to Visit  Medication Sig Dispense Refill   Accu-Chek Softclix Lancets lancets 100 each by Other route 2 times daily at 12 noon and 4 pm. Use as instructed 100 each 12   acetaminophen (TYLENOL) 325 MG tablet Take 2 tablets (650 mg total) by mouth every 6 (six) hours as needed for mild pain (or Fever >/= 101).     Ascorbic Acid (VITAMIN C) 100 MG tablet Take 100 mg by mouth daily.     aspirin 81 MG tablet Take 81 mg by mouth daily.     bisoprolol (ZEBETA) 5 MG tablet TAKE 1/2 TABLET (2.5 MG) BY MOUTH EVERY MONDAY, WEDNESDAY AND FRIDAY. 20 tablet 3   Blood Glucose Monitoring Suppl (ACCU-CHEK GUIDE) w/Device KIT 1 kit by Other route 2 (two) times daily after a meal. 1 kit 0   Calcium Citrate-Vitamin D (CALCIUM CITRATE +D PO) Take 1 tablet by mouth daily.      cyanocobalamin 1000 MCG tablet Take 1,000 mcg by mouth daily.      cyclobenzaprine (FLEXERIL) 10 MG tablet Take 1 tablet (10 mg total) by mouth 3 (three) times daily as needed for muscle spasms. 60 tablet 0   fexofenadine (ALLEGRA) 180 MG tablet Take 1 tablet (180 mg total) by mouth daily. 90 tablet 2   Fluticasone-Umeclidin-Vilant (TRELEGY ELLIPTA) 100-62.5-25 MCG/INH AEPB Inhale 1 puff into the lungs daily. 1 each 0   glucose blood test strip 100 each by Other route 2 times daily at 12 noon and 4 pm. Use as instructed 100 each 12   Ipratropium-Albuterol  (COMBIVENT) 20-100 MCG/ACT AERS respimat Inhale 1 puff into the lungs 4 (four) times daily as needed for wheezing. 4 g 1   nitroGLYCERIN (NITROSTAT) 0.4 MG SL tablet Place 1 tablet (0.4 mg total) under the tongue every 5 (five) minutes as needed for chest pain. 25 tablet 3   RELION PEN NEEDLES 32G X 4 MM MISC USE DAILY AT 2PM 100 each 0   Vitamin E 400 units TABS Take 400 Units by mouth daily.     Zinc 100 MG TABS Take 100 mg by mouth daily.     [  DISCONTINUED] atorvastatin (LIPITOR) 10 MG tablet Take 1 tablet (10 mg total) by mouth daily. (Patient not taking: No sig reported) 90 tablet 2   No current facility-administered medications on file prior to visit.    Allergies  Allergen Reactions   Aspirin     Takes ASA 44m at home.   Oxycodone Nausea Only    PERCOCET- weakness, dizziness, shaking, nausea   Penicillins Hives    Did it involve swelling of the face/tongue/throat, SOB, or low BP? No Did it involve sudden or severe rash/hives, skin peeling, or any reaction on the inside of your mouth or nose? Yes Did you need to seek medical attention at a hospital or doctor's office? Yes When did it last happen?      long time If all above answers are "NO", may proceed with cephalosporin use.    Percocet [Oxycodone-Acetaminophen]    Ciprofloxacin Rash   Prednisone Palpitations    Family History  Problem Relation Age of Onset   Colon cancer Mother    Diabetes Father    Heart disease Father    Stroke Father    Hypertension Father    Hyperlipidemia Father    Heart disease Brother    Pancreatic cancer Cousin        x 2   Prostate cancer Cousin    Heart disease Son     BP 120/74 (BP Location: Right Arm, Patient Position: Sitting, Cuff Size: Normal)   Pulse 60   Ht _0  (1.702 m)   Wt 165 lb (74.8 kg)   SpO2 97%   BMI 25.84 kg/m    Review of Systems     Objective:   Physical Exam    Lab Results  Component Value Date   HGBA1C 6.9 (A) 08/27/2021   Lab Results   Component Value Date   CREATININE 0.89 05/23/2021   BUN 12 05/23/2021   NA 140 05/23/2021   K 4.1 05/23/2021   CL 106 05/23/2021   CO2 27 05/23/2021      Assessment & Plan:  Insulin-requiring type 2 DM: overcontrolled We discussed.  He declines continuous glucose monitor and multiple daily injections for now  Patient Instructions  check your blood sugar twice a day.  vary the time of day when you check, between before the 3 meals, and at bedtime.  also check if you have symptoms of your blood sugar being too high or too low.  please keep a record of the readings and bring it to your next appointment here (or you can bring the meter itself).  You can write it on any piece of paper.  please call [email or web address] if your blood sugar goes below 70, or if most of your readings are over 200.  Please reduce the Lantus to 18 units each morning.   Please come back for a follow-up appointment in 3 months.

## 2021-09-01 ENCOUNTER — Telehealth: Payer: Self-pay

## 2021-09-01 ENCOUNTER — Other Ambulatory Visit: Payer: Self-pay

## 2021-09-01 MED ORDER — BISOPROLOL FUMARATE 5 MG PO TABS: ORAL_TABLET | ORAL | 3 refills | Status: DC

## 2021-09-01 NOTE — Telephone Encounter (Signed)
Called patient.   Prescription for Bisoprolol 5mg  45 tabs with 3 refills, sent to [facility] on [address].

## 2021-09-01 NOTE — Telephone Encounter (Signed)
Wife of patient called stating that he did not get Rx on 10/21 pharmacy is saying it is to soon for refill patient stated last refill was on 9/6 and patient is completely out  bisoprolol (ZEBETA) 5 MG tablet

## 2021-09-02 ENCOUNTER — Other Ambulatory Visit: Payer: Self-pay | Admitting: Family Medicine

## 2021-09-02 NOTE — Telephone Encounter (Signed)
Spoke with pt pharmacy regarding pt Bisoprolol, pt insurance will not cover medication until 09/09/2021. Pt pharmacy state that pt will need to pay $5.40 out of pocket cost, pt agrees to call his pharmacy and have the refill his insurance covers medication on 15 th

## 2021-09-05 ENCOUNTER — Telehealth: Payer: Self-pay

## 2021-09-05 NOTE — Telephone Encounter (Signed)
Patient called stating pharmacy informed patient that Rx was picked up on 11/6 patient stated he did not pick up Rx and wants to know why only 18 pills are being dispensed patient is asking for a call back to discuss bisoprolol (ZEBETA) 5 MG tablet

## 2021-09-05 NOTE — Telephone Encounter (Addendum)
FYI Bisoprolol 5mg  45tabs with 3 refills was sent to pharmacy on 09/01/21.  Patient only receive 18 tablets.    [place] spoke to Marlow she stated that pt insurance is only willing to pay for 18 tablets per each refill.    Called patient to informed what I was told, and advised patient to reach out to pharmacy with any questions.   Terrell   Office visit scheduled for 09/15/21, due to blood seen  when ejaculating

## 2021-09-05 NOTE — Telephone Encounter (Signed)
Please send in a new RX for Bisoprolol to take it every other day as above. Refills for one year.

## 2021-09-11 ENCOUNTER — Other Ambulatory Visit: Payer: Self-pay

## 2021-09-11 MED ORDER — BISOPROLOL FUMARATE 5 MG PO TABS: ORAL_TABLET | ORAL | 3 refills | Status: DC

## 2021-09-11 NOTE — Telephone Encounter (Signed)
Rx was changed per Dr Sarajane Jews advise, pt notified

## 2021-09-15 ENCOUNTER — Telehealth: Payer: Medicare HMO

## 2021-09-15 ENCOUNTER — Ambulatory Visit: Payer: Medicare HMO | Admitting: Family Medicine

## 2021-09-24 ENCOUNTER — Ambulatory Visit: Payer: Medicare HMO | Admitting: Family Medicine

## 2021-09-26 ENCOUNTER — Encounter: Payer: Self-pay | Admitting: Family Medicine

## 2021-09-26 ENCOUNTER — Ambulatory Visit (INDEPENDENT_AMBULATORY_CARE_PROVIDER_SITE_OTHER): Payer: Medicare HMO | Admitting: Family Medicine

## 2021-09-26 VITALS — BP 124/70 | HR 69 | Temp 98.6°F | Wt 165.0 lb

## 2021-09-26 DIAGNOSIS — R361 Hematospermia: Secondary | ICD-10-CM

## 2021-09-26 NOTE — Progress Notes (Signed)
   Subjective:    Patient ID: Bill [name], male    DOB: 27-Apr-1941, 80 y.o.   MRN: [id number]  HPI Here to discuss seeing blood in his semen off and on for the past 2 months. He never has pain or burning. No problems with urinating. No fever. His BMs are normal and his urine is always clear. His UA here in October was normal.    Review of Systems  Constitutional: Negative.   Respiratory: Negative.    Cardiovascular: Negative.   Gastrointestinal: Negative.   Genitourinary:  Negative for dysuria, flank pain, frequency, testicular pain and urgency.      Objective:   Physical Exam Constitutional:      Appearance: Normal appearance.  Cardiovascular:     Rate and Rhythm: Normal rate and regular rhythm.     Pulses: Normal pulses.     Heart sounds: Normal heart sounds.  Pulmonary:     Effort: Pulmonary effort is normal.     Breath sounds: Normal breath sounds.  Neurological:     Mental Status: He is alert.          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 Alysia Penna, MD
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
## 2021-10-07 ENCOUNTER — Ambulatory Visit: Payer: Medicare HMO | Admitting: Neurology

## 2021-10-10 ENCOUNTER — Ambulatory Visit
Admission: EM | Admit: 2021-10-10 | Discharge: 2021-10-10 | Discharge status: Against Medical Advice | Payer: Medicare HMO

## 2021-10-10 ENCOUNTER — Other Ambulatory Visit: Payer: Self-pay

## 2021-10-10 NOTE — ED Triage Notes (Signed)
Called x2 and checked waiting room

## 2021-10-11 DIAGNOSIS — U071 COVID-19: Secondary | ICD-10-CM | POA: Diagnosis not present

## 2021-11-25 DIAGNOSIS — R361 Hematospermia: Secondary | ICD-10-CM | POA: Diagnosis not present

## 2021-12-03 ENCOUNTER — Ambulatory Visit: Payer: Medicare HMO | Admitting: Endocrinology

## 2021-12-11 ENCOUNTER — Other Ambulatory Visit: Payer: Self-pay

## 2021-12-11 ENCOUNTER — Ambulatory Visit (INDEPENDENT_AMBULATORY_CARE_PROVIDER_SITE_OTHER): Payer: Medicare HMO | Admitting: Endocrinology

## 2021-12-11 ENCOUNTER — Encounter: Payer: Self-pay | Admitting: Endocrinology

## 2021-12-11 VITALS — BP 126/78 | HR 82 | Ht 67.0 in | Wt 169.8 lb

## 2021-12-11 DIAGNOSIS — Z794 Long term (current) use of insulin: Secondary | ICD-10-CM | POA: Diagnosis not present

## 2021-12-11 DIAGNOSIS — E119 Type 2 diabetes mellitus without complications: Secondary | ICD-10-CM

## 2021-12-11 DIAGNOSIS — J439 Emphysema, unspecified: Secondary | ICD-10-CM | POA: Diagnosis not present

## 2021-12-11 LAB — POCT GLYCOSYLATED HEMOGLOBIN (HGB A1C): Hemoglobin A1C: 7.8 % — AB (ref 4.0–5.6)

## 2021-12-11 NOTE — Patient Instructions (Addendum)
check your blood sugar twice a day.  vary the time of day when you check, between before the 3 meals, and at bedtime.  also check if you have symptoms of your blood sugar being too high or too low.  please keep a record of the readings and bring it to your next appointment here (or you can bring the meter itself).  You can write it on any piece of paper.  please call us sooner if your blood sugar goes below 70, or if most of your readings are over 200.   Please continue the same Lantus.    Please come back for a follow-up appointment in May.

## 2021-12-11 NOTE — Progress Notes (Signed)
Subjective:    Patient ID: Bill [name], male    DOB: 1941-05-15, 81 y.o.   MRN: [id number]  HPI Pt returns for f/u of diabetes mellitus: DM type: Insulin-requiring type 2 Dx'ed: 4580 Complications: CAD and PAD (seen on CT) Therapy: insulin since mid-2022.   DKA: never Severe hypoglycemia: never Pancreatitis: never Pancreatic imaging: normal on 2020 CT SDOH: due to memory loss, he is not a candidate for multiple daily injections.   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   Interval history: He takes Lantus as rx'ed.  He brings a record of his cbg's which I have reviewed today.  Cbg varies from 121-214.  There is no trend throughout the day. pt states he feels well in general.   Past Medical History:  Diagnosis Date   Abnormal nuclear stress test 06/02/2019   Acute respiratory failure due to COVID-19 [facility]) 10/17/2020   Anemia    Angina pectoris ([place]) 06/02/2019   Formatting of this note might be different from the original. 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 Abnormal EGD 8/17   Headache 9/98/3382   Helicobacter pylori gastritis 07/21/2016   Medicare annual wellness visit, initial 01/05/2017   Formatting of this note might be different from the original. 3/18   Mixed dyslipidemia 01/12/2020   Neck pain 02/16/2020   Other chronic pain 09/02/2020    Penicillin allergy 07/21/2016   Pneumonia    Pneumonia due to COVID-19 virus 10/17/2020   Pulmonary nodule 02/28/2014   Formatting of this note might be different from the original. RML, no change   Right groin pain 06/16/2019   Skin cancer    Trigeminal neuralgia 07/26/2020   Type 2 diabetes mellitus without complication, without long-term current use of insulin ([place]) 04/11/2021    Past Surgical History:  Procedure Laterality Date   APPENDECTOMY     back fusion     cataract surgery Left    CHOLECYSTECTOMY     COLONOSCOPY  2014   clear    LEFT HEART CATH AND CORONARY ANGIOGRAPHY N/A 06/09/2019   Procedure: LEFT HEART CATH AND CORONARY ANGIOGRAPHY;  Surgeon: Jettie Booze, MD;  Location: [facility];  Service: Cardiovascular;  Laterality: N/A;   SPINE SURGERY  1984   thoracic spine fusion     Social History   Socioeconomic History   Marital status: Significant Other    Spouse name: Not on file   Number of children: 3   Years of education: 7   Highest education level: Not on file  Occupational History   Occupation: retired    Fish farm manager: [employer].  Tobacco Use   Smoking status: Former    Packs/day: 1.50    Years: 64.00    Pack years: 96.00    Types: Cigarettes    Quit date: 2007    Years since quitting: 16.1   Smokeless tobacco: Never  Vaping Use   Vaping Use: Never used  Substance and Sexual Activity   Alcohol use: No   Drug use: No   Sexual activity: Not on file  Other Topics Concern   Not on file  Social History Narrative   Lives with significant other in a 2 story home.  Has 3 children.  Retired.  Education: 7th grade.    Social Determinants of Health   Financial Resource Strain: Low Risk    Difficulty of Paying Living Expenses: Not hard at all  Food Insecurity: No Food Insecurity   Worried About Charity fundraiser in the Last Year: Never true   Arboriculturist in the Last Year: Never true  Transportation Needs: Not on file  Physical  Activity: Insufficiently Active   Days of Exercise per Week: 1 day   Minutes of Exercise per Session: 20 min  Stress: No Stress Concern Present   Feeling of Stress : Not at all  Social Connections: Moderately Integrated   Frequency of Communication with Friends and Family: More than three times a week   Frequency of Social Gatherings with Friends and Family: More than three times a week   Attends Religious Services: 1 to 4 times per year   Active Member of [employer] or Organizations: No   Attends Archivist Meetings: Never   Marital Status: Living with partner  Intimate Partner Violence: Not on file    Current Outpatient Medications on File Prior to Visit  Medication Sig Dispense Refill   Accu-Chek Softclix Lancets lancets 100 each by Other route 2 times daily at 12 noon and 4 pm. Use as instructed 100 each 12   acetaminophen (TYLENOL) 325 MG tablet Take 2 tablets (650 mg total) by mouth every 6 (six) hours as needed for mild pain (or Fever >/= 101).     Ascorbic Acid (VITAMIN C) 100 MG tablet Take 100 mg by mouth daily.     aspirin 81 MG tablet Take 81 mg by mouth daily.     bisoprolol (ZEBETA) 5 MG tablet TAKE 1/2 TABLET (2.5 MG) BY MOUTH EVERY OTHER DAY 45 tablet 3   Blood Glucose Monitoring Suppl (ACCU-CHEK GUIDE) w/Device KIT 1 kit by Other route 2 (two) times daily after a meal. 1 kit 0   Calcium Citrate-Vitamin D (CALCIUM CITRATE +D PO) Take 1 tablet by mouth daily.      cyanocobalamin 1000 MCG tablet Take 1,000 mcg by mouth daily.      cyclobenzaprine (FLEXERIL) 10 MG tablet Take 1 tablet (10 mg total) by mouth 3 (three) times daily as needed for muscle spasms. 60 tablet 0   fexofenadine (ALLEGRA) 180 MG tablet Take 1 tablet (180 mg total) by mouth daily. 90 tablet 2   Fluticasone-Umeclidin-Vilant (TRELEGY ELLIPTA) 100-62.5-25 MCG/INH AEPB Inhale 1 puff into the lungs daily. 1 each 0   glucose blood test strip 100 each by Other route 2 times daily at 12 noon and 4 pm. Use as  instructed 100 each 12   insulin glargine (LANTUS SOLOSTAR) 100 UNIT/ML Solostar Pen Inject 18 Units into the skin every morning. And pen needles 1/day 30 mL 3   Ipratropium-Albuterol (COMBIVENT) 20-100 MCG/ACT AERS respimat Inhale 1 puff into the lungs 4 (four) times daily as needed for wheezing. 4 g 1   nitroGLYCERIN (NITROSTAT) 0.4 MG SL tablet PLACE 1 TABLET (0.4 MG TOTAL) UNDER THE TONGUE EVERY 5 (FIVE) MINUTES AS NEEDED FOR CHEST PAIN. 25 tablet 1   RELION PEN  NEEDLES 32G X 4 MM MISC USE DAILY AT 2PM 100 each 0   Vitamin E 400 units TABS Take 400 Units by mouth daily.     Zinc 100 MG TABS Take 100 mg by mouth daily.     [DISCONTINUED] atorvastatin (LIPITOR) 10 MG tablet Take 1 tablet (10 mg total) by mouth daily. (Patient not taking: No sig reported) 90 tablet 2   No current facility-administered medications on file prior to visit.    Allergies  Allergen Reactions   Aspirin     Takes ASA 59m at home.   Oxycodone Nausea Only    PERCOCET- weakness, dizziness, shaking, nausea   Penicillins Hives    Did it involve swelling of the face/tongue/throat, SOB, or low BP? No Did it involve sudden or severe rash/hives, skin peeling, or any reaction on the inside of your mouth or nose? Yes Did you need to seek medical attention at a hospital or doctor's office? Yes When did it last happen?      long time If all above answers are NO, may proceed with cephalosporin use.    Percocet [Oxycodone-Acetaminophen]    Ciprofloxacin Rash   Prednisone Palpitations    Family History  Problem Relation Age of Onset   Colon cancer Mother    Diabetes Father    Heart disease Father    Stroke Father    Hypertension Father    Hyperlipidemia Father    Heart disease Brother    Pancreatic cancer Cousin        x 2   Prostate cancer Cousin    Heart disease Son     BP 126/78 (BP Location: Left Arm, Patient Position: Sitting, Cuff Size: Normal)    Pulse 82    Ht _0  (1.702 m)    Wt 169 lb 12.8 oz (77  kg)    SpO2 93%    BMI 26.59 kg/m    Review of Systems He denies hypoglycemia.      Objective:   Physical Exam VITAL SIGNS:  See vs page GENERAL: no distress   Lab Results  Component Value Date   HGBA1C 7.8 (A) 12/11/2021      Assessment & Plan:  Insulin-requiring type 2 DM: this is the best control this pt should aim for, given this regimen, which does match insulin to his changing needs throughout the day  Patient Instructions  check your blood sugar twice a day.  vary the time of day when you check, between before the 3 meals, and at bedtime.  also check if you have symptoms of your blood sugar being too high or too low.  please keep a record of the readings and bring it to your next appointment here (or you can bring the meter itself).  You can write it on any piece of paper.  please call [email or web address] if your blood sugar goes below 70, or if most of your readings are over 200.   Please continue the same Lantus.    Please come back for a follow-up appointment in May.

## 2021-12-15 ENCOUNTER — Telehealth: Payer: Self-pay | Admitting: Pharmacist

## 2021-12-15 NOTE — Chronic Care Management (AMB) (Signed)
Chronic Care Management Pharmacy Assistant   Name: Bill [name]  MRN: [id number] DOB: 12-Feb-1941  Reason for Encounter: Disease State / Diabetes Assessment   Conditions to be addressed/monitored: DMII  Recent office visits:  09/26/21 [name], MD - Patient presented for Hemospermia. No other visit details available.  08/15/21 [name], MD - Patient presented for Contusion of rib on right side and other concerns. Prescribed Cyclobenzaprine HCL 10 mg.  08/04/21 [name], MD - Patient presented for Pelvic pain in male. No medication changes.  Recent consult visits:  12/15/21 [name], MD (Endo) - Patient presented for Type 2 diabetes and other concerns. No medication changes.  10/11/21 [facility] - Patient presented for COVID - 19 - No other visit details available.  08/27/21 [name], MD (Endo) - Patient presented for Type 2 diabetes and other concerns. Decreased Insulin Glargine to 18 units Sub Q  08/24/21 [name], [name], MD (Cardiology) - Patient presented for Coronary artery disease involving native coronary artery of native heart without angina pectoris and other concerns. No medication changes.  Hospital visits:  None in previous 6 months  Medications: Outpatient Encounter Medications as of 12/15/2021  Medication Sig   Accu-Chek Softclix Lancets lancets 100 each by Other route 2 times daily at 12 noon and 4 pm. Use as instructed   acetaminophen (TYLENOL) 325 MG tablet Take 2 tablets (650 mg total) by mouth every 6 (six) hours as needed for mild pain (or Fever >/= 101).   Ascorbic Acid (VITAMIN C) 100 MG tablet Take 100 mg by mouth daily.   aspirin 81 MG tablet Take 81 mg by mouth daily.   bisoprolol (ZEBETA) 5 MG tablet TAKE 1/2 TABLET (2.5 MG) BY MOUTH EVERY OTHER DAY   Blood Glucose Monitoring Suppl (ACCU-CHEK GUIDE) w/Device KIT 1 kit by Other route 2 (two) times daily after a meal.   Calcium Citrate-Vitamin D (CALCIUM  CITRATE +D PO) Take 1 tablet by mouth daily.    cyanocobalamin 1000 MCG tablet Take 1,000 mcg by mouth daily.    cyclobenzaprine (FLEXERIL) 10 MG tablet Take 1 tablet (10 mg total) by mouth 3 (three) times daily as needed for muscle spasms.   fexofenadine (ALLEGRA) 180 MG tablet Take 1 tablet (180 mg total) by mouth daily.   Fluticasone-Umeclidin-Vilant (TRELEGY ELLIPTA) 100-62.5-25 MCG/INH AEPB Inhale 1 puff into the lungs daily.   glucose blood test strip 100 each by Other route 2 times daily at 12 noon and 4 pm. Use as instructed   insulin glargine (LANTUS SOLOSTAR) 100 UNIT/ML Solostar Pen Inject 18 Units into the skin every morning. And pen needles 1/day   Ipratropium-Albuterol (COMBIVENT) 20-100 MCG/ACT AERS respimat Inhale 1 puff into the lungs 4 (four) times daily as needed for wheezing.   nitroGLYCERIN (NITROSTAT) 0.4 MG SL tablet PLACE 1 TABLET (0.4 MG TOTAL) UNDER THE TONGUE EVERY 5 (FIVE) MINUTES AS NEEDED FOR CHEST PAIN.   RELION PEN NEEDLES 32G X 4 MM MISC USE DAILY AT 2PM   Vitamin E 400 units TABS Take 400 Units by mouth daily.   Zinc 100 MG TABS Take 100 mg by mouth daily.   [DISCONTINUED] atorvastatin (LIPITOR) 10 MG tablet Take 1 tablet (10 mg total) by mouth daily. (Patient not taking: No sig reported)   No facility-administered encounter medications on file as of 12/15/2021.  Recent Relevant Labs: Lab Results  Component Value Date/Time   HGBA1C 7.8 (A) 12/11/2021 04:11 PM   HGBA1C  6.9 (A) 08/27/2021 03:13 PM   HGBA1C 16.8 Repeated and verified X2. (H) 04/11/2021 11:19 AM   HGBA1C 7.9 (H) 10/18/2020 07:35 AM    Kidney Function Lab Results  Component Value Date/Time   CREATININE 0.89 05/23/2021 02:28 PM   CREATININE 0.75 04/11/2021 11:19 AM   CREATININE 0.90 11/13/2020 03:13 PM   GFR 86.39 04/11/2021 11:19 AM   GFRNONAA >60 05/23/2021 02:28 PM   GFRAA >60 05/09/2020 02:33 PM   GFRAA >60 04/19/2020 01:30 PM    Current antihyperglycemic regimen:  Lantus inject 18  units every morning What recent interventions/DTPs have been made to improve glycemic control:  Patient reports no changes Have there been any recent hospitalizations or ED visits since last visit with CPP? No Patient denies hypoglycemic symptoms, including None Patient denies hyperglycemic symptoms, including none How often are you checking your blood sugar? once daily What are your blood sugars ranging?  Fasting: 130, 140 Per chart notes with Shan Levans, mentioned she wanted him to try checking in morning before eating and once again before he goes to bed. Patient reports he will try doing so. During the week, how often does your blood glucose drop below 70? Never  Adherence Review: Is the patient currently on a STATIN medication? No Is the patient currently on ACE/ARB medication? No Does the patient have >5 day gap between last estimated fill dates? No   Care Gaps: COVID Booster - Overdue Eye Exam - Overdue Urine Micro - Overdue Hepatitis C Screening - Overdue Zoster Vaccine - Overdue PNA Vaccine - Overdue Flu Vaccine - Overdue AWV- 4/22 Lab Results  Component Value Date   HGBA1C 7.8 (A) 12/11/2021    Star Rating Drugs: None   Ned Clines McClure Clinical Pharmacist Assistant [phone]

## 2021-12-26 ENCOUNTER — Ambulatory Visit: Payer: Medicare HMO | Admitting: Neurology

## 2022-01-29 DIAGNOSIS — D2262 Melanocytic nevi of left upper limb, including shoulder: Secondary | ICD-10-CM | POA: Diagnosis not present

## 2022-01-29 DIAGNOSIS — L237 Allergic contact dermatitis due to plants, except food: Secondary | ICD-10-CM | POA: Diagnosis not present

## 2022-01-29 DIAGNOSIS — L821 Other seborrheic keratosis: Secondary | ICD-10-CM | POA: Diagnosis not present

## 2022-01-29 DIAGNOSIS — D225 Melanocytic nevi of trunk: Secondary | ICD-10-CM | POA: Diagnosis not present

## 2022-01-29 DIAGNOSIS — D2261 Melanocytic nevi of right upper limb, including shoulder: Secondary | ICD-10-CM | POA: Diagnosis not present

## 2022-01-29 DIAGNOSIS — Z85828 Personal history of other malignant neoplasm of skin: Secondary | ICD-10-CM | POA: Diagnosis not present

## 2022-02-04 DIAGNOSIS — J3089 Other allergic rhinitis: Secondary | ICD-10-CM | POA: Diagnosis not present

## 2022-02-18 ENCOUNTER — Other Ambulatory Visit: Payer: Self-pay

## 2022-02-19 ENCOUNTER — Ambulatory Visit: Payer: Medicare HMO | Admitting: Cardiology

## 2022-02-19 ENCOUNTER — Encounter: Payer: Self-pay | Admitting: Cardiology

## 2022-02-19 VITALS — BP 134/66 | HR 72 | Ht 67.0 in | Wt 167.0 lb

## 2022-02-19 DIAGNOSIS — E119 Type 2 diabetes mellitus without complications: Secondary | ICD-10-CM

## 2022-02-19 DIAGNOSIS — I2510 Atherosclerotic heart disease of native coronary artery without angina pectoris: Secondary | ICD-10-CM | POA: Diagnosis not present

## 2022-02-19 NOTE — Patient Instructions (Signed)

## 2022-02-19 NOTE — Progress Notes (Signed)
?Cardiology Office Note:   ? ?Date:  02/19/2022  ? ?ID:  Bill [name], DOB 1941/04/18, MRN [id number] ? ?PCP:  [name], MD  ?Cardiologist:  Jenean Lindau, MD  ? ?Referring MD: [name], MD  ? ? ?ASSESSMENT:   ? ?1. Coronary artery disease involving native coronary artery of native heart without angina pectoris   ?2. Type 2 diabetes mellitus without complication, without long-term current use of insulin ([place])   ? ?PLAN:   ? ?In order of problems listed above: ? ?Coronary artery disease: Secondary prevention stressed with the patient.  Importance of compliance with diet medication stressed any vocalized understanding.  He was advised to walk to the best of his ability on a regular basis.  He promises to do this. ?Diabetes mellitus: Managed by primary care.  Diet emphasized.  Active lifestyle urged. ?Hyperlipidemia: Managed by primary care.  He is not on statins because of elevated LFTs in the past.  I urged him to discuss this with primary care.  He will get back to me if he is willing to go on lipid-lowering medications. ?Patient will be seen in follow-up appointment in 6 months or earlier if the patient has any concerns ? ? ? ?Medication Adjustments/Labs and Tests Ordered: ?Current medicines are reviewed at length with the patient today.  Concerns regarding medicines are outlined above.  ?No orders of the defined types were placed in this encounter. ? ?No orders of the defined types were placed in this encounter. ? ? ? ?No chief complaint on file. ?  ? ?History of Present Illness:   ? ?Bill [name] is a 81 y.o. male.  Patient has past medical history of coronary artery disease and diabetes mellitus.  He has history of elevated LFTs in the past.  He denies any problems at this time and takes care of activities of daily living.  No chest pain orthopnea or PND.  At the time of my evaluation, the patient is alert awake oriented and in no distress. ? ?Past Medical History:  ?Diagnosis Date  ?  Abnormal nuclear stress test 06/02/2019  ? Acute respiratory failure due to COVID-19 [facility]) 10/17/2020  ? Anemia   ? Angina pectoris ([place]) 06/02/2019  ? Formatting of this note might be different from the original. Normal cath 10/98, 3/16  ? Anxiety   ? Body mass index (BMI) 26.0-26.9, adult 02/16/2020  ? CAD (coronary artery disease) 07/21/2016  ? Cardiac arrhythmia   ? Cervical spondylosis 02/16/2020  ? Chronic nonintractable headache 09/02/2020  ? COPD (chronic obstructive pulmonary disease) ([place])   ? 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 COVID-19 virus infection 10/17/2020  ? Elevated blood-pressure reading, without diagnosis of hypertension 02/16/2020  ? Elevated LFTs 01/12/2020  ? Frequent headaches 04/14/2018  ? Gastro-esophageal reflux disease with esophagitis 02/28/2014  ? Formatting of this note might be different from the original. Abnormal EGD 8/17  ? Headache 01/06/2021  ? Helicobacter pylori gastritis 07/21/2016  ? Medicare annual wellness visit, initial 01/05/2017  ? Formatting of this note might be different from the original. 3/18  ? Mixed dyslipidemia 01/12/2020  ? Neck pain 02/16/2020  ? Other chronic pain 09/02/2020  ? Penicillin allergy 07/21/2016  ? Pneumonia   ? Pneumonia due to COVID-19 virus 10/17/2020  ? Pulmonary nodule 02/28/2014  ? Formatting of this note might be different from the original. RML, no change  ? Right groin pain 06/16/2019  ? Skin cancer   ?  Trigeminal neuralgia 07/26/2020  ? Type 2 diabetes mellitus without complication, without long-term current use of insulin ([place]) 04/11/2021  ? ? ?Past Surgical History:  ?Procedure Laterality Date  ? APPENDECTOMY    ? back fusion    ? cataract surgery Left   ? CHOLECYSTECTOMY    ? COLONOSCOPY  2014  ? clear   ? LEFT HEART CATH AND CORONARY ANGIOGRAPHY N/A 06/09/2019  ? Procedure: LEFT HEART CATH AND CORONARY ANGIOGRAPHY;  Surgeon: Jettie Booze, MD;  Location: [place] [facility];  Service: Cardiovascular;   Laterality: N/A;  ? [place]  ? thoracic spine fusion   ? ? ?Current Medications: ?Current Meds  ?Medication Sig  ? Accu-Chek Softclix Lancets lancets 100 each by Other route 2 times daily at 12 noon and 4 pm. Use as instructed  ? acetaminophen (TYLENOL) 325 MG tablet Take 2 tablets (650 mg total) by mouth every 6 (six) hours as needed for mild pain (or Fever >/= 101).  ? Ascorbic Acid (VITAMIN C) 100 MG tablet Take 100 mg by mouth daily.  ? aspirin 81 MG tablet Take 81 mg by mouth daily.  ? bisoprolol (ZEBETA) 5 MG tablet TAKE 1/2 TABLET (2.5 MG) BY MOUTH EVERY OTHER DAY  ? Blood Glucose Monitoring Suppl (ACCU-CHEK GUIDE) w/Device KIT 1 kit by Other route 2 (two) times daily after a meal.  ? Calcium Citrate-Vitamin D (CALCIUM CITRATE +D PO) Take 1 tablet by mouth daily.   ? cyanocobalamin 1000 MCG tablet Take 1,000 mcg by mouth daily.   ? cyclobenzaprine (FLEXERIL) 10 MG tablet Take 1 tablet (10 mg total) by mouth 3 (three) times daily as needed for muscle spasms.  ? fexofenadine (ALLEGRA) 180 MG tablet Take 1 tablet (180 mg total) by mouth daily.  ? finasteride (PROSCAR) 5 MG tablet Take 5 mg by mouth daily.  ? Fluticasone-Umeclidin-Vilant (TRELEGY ELLIPTA) 100-62.5-25 MCG/INH AEPB Inhale 1 puff into the lungs daily.  ? glucose blood test strip 100 each by Other route 2 times daily at 12 noon and 4 pm. Use as instructed  ? insulin glargine (LANTUS SOLOSTAR) 100 UNIT/ML Solostar Pen Inject 18 Units into the skin every morning. And pen needles 1/day  ? Ipratropium-Albuterol (COMBIVENT) 20-100 MCG/ACT AERS respimat Inhale 1 puff into the lungs 4 (four) times daily as needed for wheezing.  ? nitroGLYCERIN (NITROSTAT) 0.4 MG SL tablet PLACE 1 TABLET (0.4 MG TOTAL) UNDER THE TONGUE EVERY 5 (FIVE) MINUTES AS NEEDED FOR CHEST PAIN.  ? RELION PEN NEEDLES 32G X 4 MM MISC USE DAILY AT 2PM  ? Vitamin E 400 units TABS Take 400 Units by mouth daily.  ? Zinc 100 MG TABS Take 100 mg by mouth daily.  ?  ? ?Allergies:    Aspirin, Oxycodone, Penicillins, Percocet [oxycodone-acetaminophen], Ciprofloxacin, and Prednisone  ? ?Social History  ? ?Socioeconomic History  ? Marital status: Significant Other  ?  Spouse name: Not on file  ? Number of children: 3  ? Years of education: 7  ? Highest education level: Not on file  ?Occupational History  ? Occupation: retired  ?  Employer: [employer]  ?Tobacco Use  ? Smoking status: Former  ?  Packs/day: 1.50  ?  Years: 64.00  ?  Pack years: 96.00  ?  Types: Cigarettes  ?  Quit date: 2007  ?  Years since quitting: 16.3  ? Smokeless tobacco: Never  ?Vaping Use  ? Vaping Use: Never used  ?Substance and Sexual Activity  ?  Alcohol use: No  ? Drug use: No  ? Sexual activity: Not on file  ?Other Topics Concern  ? Not on file  ?Social History Narrative  ? Lives with significant other in a 2 story home.  Has 3 children.  Retired.  Education: 7th grade.   ? ?Social Determinants of Health  ? ?Financial Resource Strain: Low Risk   ? Difficulty of Paying Living Expenses: Not hard at all  ?Food Insecurity: No Food Insecurity  ? Worried About Charity fundraiser in the Last Year: Never true  ? Ran Out of Food in the Last Year: Never true  ?Transportation Needs: Not on file  ?Physical Activity: Insufficiently Active  ? Days of Exercise per Week: 1 day  ? Minutes of Exercise per Session: 20 min  ?Stress: No Stress Concern Present  ? Feeling of Stress : Not at all  ?Social Connections: Moderately Integrated  ? Frequency of Communication with Friends and Family: More than three times a week  ? Frequency of Social Gatherings with Friends and Family: More than three times a week  ? Attends Religious Services: 1 to 4 times per year  ? Active Member of Clubs or Organizations: No  ? Attends Archivist Meetings: Never  ? Marital Status: Living with partner  ?  ? ?Family History: ?The patient's family history includes Colon cancer in his mother; Diabetes in his father; Heart disease in his brother,  father, and son; Hyperlipidemia in his father; Hypertension in his father; Pancreatic cancer in his cousin; Prostate cancer in his cousin; Stroke in his father. ? ?ROS:   ?Please see the history of present illnes

## 2022-02-20 ENCOUNTER — Telehealth: Payer: Self-pay | Admitting: Family Medicine

## 2022-02-20 ENCOUNTER — Ambulatory Visit (INDEPENDENT_AMBULATORY_CARE_PROVIDER_SITE_OTHER): Payer: Medicare HMO

## 2022-02-20 VITALS — BP 118/60 | HR 65 | Temp 98.1°F | Ht 67.0 in | Wt 169.1 lb

## 2022-02-20 DIAGNOSIS — Z0000 Encounter for general adult medical examination without abnormal findings: Secondary | ICD-10-CM | POA: Diagnosis not present

## 2022-02-20 DIAGNOSIS — Z209 Contact with and (suspected) exposure to unspecified communicable disease: Secondary | ICD-10-CM

## 2022-02-20 DIAGNOSIS — E119 Type 2 diabetes mellitus without complications: Secondary | ICD-10-CM

## 2022-02-20 NOTE — Progress Notes (Signed)
? ?Subjective:  ? Bill [name] is a 81 y.o. male who presents for Medicare Annual/Subsequent preventive examination. ? ?Review of Systems    ?Cardiac Risk Factors include: advanced age (>53mn, >>73women);diabetes mellitus;hypertension ? ?   ?Objective:  ?  ?Today's Vitals  ? 02/20/22 1406  ?BP: 118/60  ?Pulse: 65  ?Temp: 98.1 ?F (36.7 ?C)  ?TempSrc: Oral  ?SpO2: 94%  ?Weight: 169 lb 1.6 oz (76.7 kg)  ?Height: 5' 7"  (1.702 m)  ? ?Body mass index is 26.48 kg/m?. ? ? ?  02/20/2022  ?  2:32 PM 02/19/2021  ?  4:04 PM 10/17/2020  ?  7:30 PM 05/09/2020  ?  2:08 PM 08/29/2019  ?  2:23 PM 06/09/2019  ?  7:12 AM 06/24/2018  ? 11:09 AM  ?Advanced Directives  ?Does Patient Have a Medical Advance Directive? Yes Yes No No No No No  ?Type of [place]of [place] will [place] will       ?Does patient want to make changes to medical advance directive? No - Patient declined No - Patient declined     No - Patient declined  ?Copy of HH[place] Chart? No - copy requested No - copy requested       ?Would patient like information on creating a medical advance directive?   No - Patient declined No - Patient declined Yes (MAU/Ambulatory/Procedural Areas - Information given) No - Patient declined No - Patient declined  ? ? ?Current Medications (verified) ?Outpatient Encounter Medications as of 02/20/2022  ?Medication Sig  ? Accu-Chek Softclix Lancets lancets 100 each by Other route 2 times daily at 12 noon and 4 pm. Use as instructed  ? acetaminophen (TYLENOL) 325 MG tablet Take 2 tablets (650 mg total) by mouth every 6 (six) hours as needed for mild pain (or Fever >/= 101).  ? Ascorbic Acid (VITAMIN C) 100 MG tablet Take 100 mg by mouth daily.  ? aspirin 81 MG tablet Take 81 mg by mouth daily.  ? bisoprolol (ZEBETA) 5 MG tablet TAKE 1/2 TABLET (2.5 MG) BY MOUTH EVERY OTHER DAY  ? Blood Glucose Monitoring Suppl (ACCU-CHEK GUIDE) w/Device KIT 1 kit by Other  route 2 (two) times daily after a meal.  ? Calcium Citrate-Vitamin D (CALCIUM CITRATE +D PO) Take 1 tablet by mouth daily.   ? cyanocobalamin 1000 MCG tablet Take 1,000 mcg by mouth daily.   ? cyclobenzaprine (FLEXERIL) 10 MG tablet Take 1 tablet (10 mg total) by mouth 3 (three) times daily as needed for muscle spasms.  ? fexofenadine (ALLEGRA) 180 MG tablet Take 1 tablet (180 mg total) by mouth daily.  ? finasteride (PROSCAR) 5 MG tablet Take 5 mg by mouth daily.  ? Fluticasone-Umeclidin-Vilant (TRELEGY ELLIPTA) 100-62.5-25 MCG/INH AEPB Inhale 1 puff into the lungs daily.  ? glucose blood test strip 100 each by Other route 2 times daily at 12 noon and 4 pm. Use as instructed  ? insulin glargine (LANTUS SOLOSTAR) 100 UNIT/ML Solostar Pen Inject 18 Units into the skin every morning. And pen needles 1/day  ? Ipratropium-Albuterol (COMBIVENT) 20-100 MCG/ACT AERS respimat Inhale 1 puff into the lungs 4 (four) times daily as needed for wheezing.  ? nitroGLYCERIN (NITROSTAT) 0.4 MG SL tablet PLACE 1 TABLET (0.4 MG TOTAL) UNDER THE TONGUE EVERY 5 (FIVE) MINUTES AS NEEDED FOR CHEST PAIN.  ? RELION PEN NEEDLES 32G X 4 MM MISC USE DAILY AT 2PM  ? Vitamin E 400 units  TABS Take 400 Units by mouth daily.  ? Zinc 100 MG TABS Take 100 mg by mouth daily.  ? [DISCONTINUED] atorvastatin (LIPITOR) 10 MG tablet Take 1 tablet (10 mg total) by mouth daily. (Patient not taking: Reported on 09/26/2020)  ? ?No facility-administered encounter medications on file as of 02/20/2022.  ? ? ?Allergies (verified) ?Aspirin, Oxycodone, Penicillins, Percocet [oxycodone-acetaminophen], Ciprofloxacin, and Prednisone  ? ?History: ?Past Medical History:  ?Diagnosis Date  ? Abnormal nuclear stress test 06/02/2019  ? Acute respiratory failure due to COVID-19 [facility]) 10/17/2020  ? Anemia   ? Angina pectoris ([place]) 06/02/2019  ? Formatting of this note might be different from the original. Normal cath 10/98, 3/16  ? Anxiety   ? Body mass index (BMI) 26.0-26.9, adult  02/16/2020  ? CAD (coronary artery disease) 07/21/2016  ? Cardiac arrhythmia   ? Cervical spondylosis 02/16/2020  ? Chronic nonintractable headache 09/02/2020  ? COPD (chronic obstructive pulmonary disease) ([place])   ? 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 COVID-19 virus infection 10/17/2020  ? Elevated blood-pressure reading, without diagnosis of hypertension 02/16/2020  ? Elevated LFTs 01/12/2020  ? Frequent headaches 04/14/2018  ? Gastro-esophageal reflux disease with esophagitis 02/28/2014  ? Formatting of this note might be different from the original. Abnormal EGD 8/17  ? Headache 01/06/2021  ? Helicobacter pylori gastritis 07/21/2016  ? Medicare annual wellness visit, initial 01/05/2017  ? Formatting of this note might be different from the original. 3/18  ? Mixed dyslipidemia 01/12/2020  ? Neck pain 02/16/2020  ? Other chronic pain 09/02/2020  ? Penicillin allergy 07/21/2016  ? Pneumonia   ? Pneumonia due to COVID-19 virus 10/17/2020  ? Pulmonary nodule 02/28/2014  ? Formatting of this note might be different from the original. RML, no change  ? Right groin pain 06/16/2019  ? Skin cancer   ? Trigeminal neuralgia 07/26/2020  ? Type 2 diabetes mellitus without complication, without long-term current use of insulin ([place]) 04/11/2021  ? ?Past Surgical History:  ?Procedure Laterality Date  ? APPENDECTOMY    ? back fusion    ? cataract surgery Left   ? CHOLECYSTECTOMY    ? COLONOSCOPY  2014  ? clear   ? LEFT HEART CATH AND CORONARY ANGIOGRAPHY N/A 06/09/2019  ? Procedure: LEFT HEART CATH AND CORONARY ANGIOGRAPHY;  Surgeon: Jettie Booze, MD;  Location: [place] [facility];  Service: Cardiovascular;  Laterality: N/A;  ? [place]  ? thoracic spine fusion   ? ?Family History  ?Problem Relation Age of Onset  ? Colon cancer Mother   ? Diabetes Father   ? Heart disease Father   ? Stroke Father   ? Hypertension Father   ? Hyperlipidemia Father   ? Heart disease Brother   ? Pancreatic  cancer Cousin   ?     x 2  ? Prostate cancer Cousin   ? Heart disease Son   ? ?Social History  ? ?Socioeconomic History  ? Marital status: Significant Other  ?  Spouse name: Not on file  ? Number of children: 3  ? Years of education: 7  ? Highest education level: Not on file  ?Occupational History  ? Occupation: retired  ?  Employer: [employer]  ?Tobacco Use  ? Smoking status: Former  ?  Packs/day: 1.50  ?  Years: 64.00  ?  Pack years: 96.00  ?  Types: Cigarettes  ?  Quit date: 2007  ?  Years since  quitting: 16.3  ? Smokeless tobacco: Never  ?Vaping Use  ? Vaping Use: Never used  ?Substance and Sexual Activity  ? Alcohol use: No  ? Drug use: No  ? Sexual activity: Not on file  ?Other Topics Concern  ? Not on file  ?Social History Narrative  ? Lives with significant other in a 2 story home.  Has 3 children.  Retired.  Education: 7th grade.   ? ?Social Determinants of Health  ? ?Financial Resource Strain: Low Risk   ? Difficulty of Paying Living Expenses: Not hard at all  ?Food Insecurity: No Food Insecurity  ? Worried About Charity fundraiser in the Last Year: Never true  ? Ran Out of Food in the Last Year: Never true  ?Transportation Needs: No Transportation Needs  ? Lack of Transportation (Medical): No  ? Lack of Transportation (Non-Medical): No  ?Physical Activity: Sufficiently Active  ? Days of Exercise per Week: 5 days  ? Minutes of Exercise per Session: 150+ min  ?Stress: No Stress Concern Present  ? Feeling of Stress : Not at all  ?Social Connections: Socially Isolated  ? Frequency of Communication with Friends and Family: More than three times a week  ? Frequency of Social Gatherings with Friends and Family: More than three times a week  ? Attends Religious Services: Never  ? Active Member of Clubs or Organizations: No  ? Attends Archivist Meetings: Never  ? Marital Status: Never married  ? ? ? ?Clinical Intake: ? ?Pre-visit preparation completed: NoNutrition Risk Assessment: ? ?Has the  patient had any N/V/D within the last 2 months?  No  ?Does the patient have any non-healing wounds?  No  ?Has the patient had any unintentional weight loss or weight gain?  No  ? ?Diabetes: ? ?Is the patient

## 2022-02-20 NOTE — Patient Instructions (Addendum)
?[name] , ?Thank you for taking time to come for your Medicare Wellness Visit. I appreciate your ongoing commitment to your health goals. Please review the following plan we discussed and let me know if I can assist you in the future.  ? ?These are the goals we discussed: ? Goals   ? ?   DIET - INCREASE LEAN PROTEINS (pt-stated)   ?   I would like to continue to work on my bloodsugar level ?  ? ?  ?  ?This is a list of the screening recommended for you and due dates:  ?Health Maintenance  ?Topic Date Due  ? Eye exam for diabetics  Never done  ? Urine Protein Check  Never done  ? Hepatitis C Screening: USPSTF Recommendation to screen - Ages 71-79 yo.  Never done  ? COVID-19 Vaccine (1) 03/08/2022*  ? Zoster (Shingles) Vaccine (1 of 2) 05/22/2022*  ? Pneumonia Vaccine (2 - PCV) 02/21/2023*  ? Flu Shot  05/26/2022  ? Hemoglobin A1C  06/10/2022  ? Complete foot exam   06/24/2022  ? Tetanus Vaccine  06/12/2025  ? HPV Vaccine  Aged Out  ?*Topic was postponed. The date shown is not the original due date.  ? ?Advanced directives: Yes ? ?Conditions/risks identified: None ? ?Next appointment: Follow up in one year for your annual wellness visit.  ? ?Preventive Care 57 Years and Older, Male ?Preventive care refers to lifestyle choices and visits with your health care provider that can promote health and wellness. ?What does preventive care include? ?A yearly physical exam. This is also called an annual well check. ?Dental exams once or twice a year. ?Routine eye exams. Ask your health care provider how often you should have your eyes checked. ?Personal lifestyle choices, including: ?Daily care of your teeth and gums. ?Regular physical activity. ?Eating a healthy diet. ?Avoiding tobacco and drug use. ?Limiting alcohol use. ?Practicing safe sex. ?Taking low doses of aspirin every day. ?Taking vitamin and mineral supplements as recommended by your health care provider. ?What happens during an annual well check? ?The  services and screenings done by your health care provider during your annual well check will depend on your age, overall health, lifestyle risk factors, and family history of disease. ?Counseling  ?Your health care provider may ask you questions about your: ?Alcohol use. ?Tobacco use. ?Drug use. ?Emotional well-being. ?Home and relationship well-being. ?Sexual activity. ?Eating habits. ?History of falls. ?Memory and ability to understand (cognition). ?Work and work Statistician. ?Screening  ?You may have the following tests or measurements: ?Height, weight, and BMI. ?Blood pressure. ?Lipid and cholesterol levels. These may be checked every 5 years, or more frequently if you are over 32 years old. ?Skin check. ?Lung cancer screening. You may have this screening every year starting at age 58 if you have a 30-pack-year history of smoking and currently smoke or have quit within the past 15 years. ?Fecal occult blood test (FOBT) of the stool. You may have this test every year starting at age 69. ?Flexible sigmoidoscopy or colonoscopy. You may have a sigmoidoscopy every 5 years or a colonoscopy every 10 years starting at age 40. ?Prostate cancer screening. Recommendations will vary depending on your family history and other risks. ?Hepatitis C blood test. ?Hepatitis B blood test. ?Sexually transmitted disease (STD) testing. ?Diabetes screening. This is done by checking your blood sugar (glucose) after you have not eaten for a while (fasting). You may have this done every 1-3 years. ?Abdominal aortic aneurysm (AAA) screening.  You may need this if you are a current or former smoker. ?Osteoporosis. You may be screened starting at age 6 if you are at high risk. ?Talk with your health care provider about your test results, treatment options, and if necessary, the need for more tests. ?Vaccines  ?Your health care provider may recommend certain vaccines, such as: ?Influenza vaccine. This is recommended every year. ?Tetanus,  diphtheria, and acellular pertussis (Tdap, Td) vaccine. You may need a Td booster every 10 years. ?Zoster vaccine. You may need this after age 82. ?Pneumococcal 13-valent conjugate (PCV13) vaccine. One dose is recommended after age 39. ?Pneumococcal polysaccharide (PPSV23) vaccine. One dose is recommended after age 40. ?Talk to your health care provider about which screenings and vaccines you need and how often you need them. ?This information is not intended to replace advice given to you by your health care provider. Make sure you discuss any questions you have with your health care provider. ?Document Released: 11/08/2015 Document Revised: 07/01/2016 Document Reviewed: 08/13/2015 ?Elsevier Interactive Patient Education ? 2017 [place]. ? ?Fall Prevention in the Home ?Falls can cause injuries. They can happen to people of all ages. There are many things you can do to make your home safe and to help prevent falls. ?What can I do on the outside of my home? ?Regularly fix the edges of walkways and driveways and fix any cracks. ?Remove anything that might make you trip as you walk through a door, such as a raised step or threshold. ?Trim any bushes or trees on the path to your home. ?Use bright outdoor lighting. ?Clear any walking paths of anything that might make someone trip, such as rocks or tools. ?Regularly check to see if handrails are loose or broken. Make sure that both sides of any steps have handrails. ?Any raised decks and porches should have guardrails on the edges. ?Have any leaves, snow, or ice cleared regularly. ?Use sand or salt on walking paths during winter. ?Clean up any spills in your garage right away. This includes oil or grease spills. ?What can I do in the bathroom? ?Use night lights. ?Install grab bars by the toilet and in the tub and shower. Do not use towel bars as grab bars. ?Use non-skid mats or decals in the tub or shower. ?If you need to sit down in the shower, use a plastic,  non-slip stool. ?Keep the floor dry. Clean up any water that spills on the floor as soon as it happens. ?Remove soap buildup in the tub or shower regularly. ?Attach bath mats securely with double-sided non-slip rug tape. ?Do not have throw rugs and other things on the floor that can make you trip. ?What can I do in the bedroom? ?Use night lights. ?Make sure that you have a light by your bed that is easy to reach. ?Do not use any sheets or blankets that are too big for your bed. They should not hang down onto the floor. ?Have a firm chair that has side arms. You can use this for support while you get dressed. ?Do not have throw rugs and other things on the floor that can make you trip. ?What can I do in the kitchen? ?Clean up any spills right away. ?Avoid walking on wet floors. ?Keep items that you use a lot in easy-to-reach places. ?If you need to reach something above you, use a strong step stool that has a grab bar. ?Keep electrical cords out of the way. ?Do not use floor polish or wax  that makes floors slippery. If you must use wax, use non-skid floor wax. ?Do not have throw rugs and other things on the floor that can make you trip. ?What can I do with my stairs? ?Do not leave any items on the stairs. ?Make sure that there are handrails on both sides of the stairs and use them. Fix handrails that are broken or loose. Make sure that handrails are as long as the stairways. ?Check any carpeting to make sure that it is firmly attached to the stairs. Fix any carpet that is loose or worn. ?Avoid having throw rugs at the top or bottom of the stairs. If you do have throw rugs, attach them to the floor with carpet tape. ?Make sure that you have a light switch at the top of the stairs and the bottom of the stairs. If you do not have them, ask someone to add them for you. ?What else can I do to help prevent falls? ?Wear shoes that: ?Do not have high heels. ?Have rubber bottoms. ?Are comfortable and fit you well. ?Are closed  at the toe. Do not wear sandals. ?If you use a stepladder: ?Make sure that it is fully opened. Do not climb a closed stepladder. ?Make sure that both sides of the stepladder are locked into place. ?Ask someone to hold

## 2022-02-20 NOTE — Telephone Encounter (Signed)
Done

## 2022-03-10 ENCOUNTER — Ambulatory Visit: Payer: Medicare HMO | Admitting: Endocrinology

## 2022-05-18 ENCOUNTER — Ambulatory Visit (INDEPENDENT_AMBULATORY_CARE_PROVIDER_SITE_OTHER): Payer: Medicare HMO | Admitting: Family Medicine

## 2022-05-18 ENCOUNTER — Encounter: Payer: Self-pay | Admitting: Family Medicine

## 2022-05-18 VITALS — BP 120/64 | HR 76 | Temp 98.8°F | Wt 162.4 lb

## 2022-05-18 DIAGNOSIS — R361 Hematospermia: Secondary | ICD-10-CM | POA: Diagnosis not present

## 2022-05-18 LAB — POC URINALSYSI DIPSTICK (AUTOMATED)
Bilirubin, UA: NEGATIVE
Blood, UA: NEGATIVE
Glucose, UA: POSITIVE — AB
Ketones, UA: NEGATIVE
Leukocytes, UA: NEGATIVE
Nitrite, UA: NEGATIVE
Protein, UA: NEGATIVE
Spec Grav, UA: 1.02 (ref 1.010–1.025)
Urobilinogen, UA: 0.2 E.U./dL
pH, UA: 5.5 (ref 5.0–8.0)

## 2022-05-18 NOTE — Progress Notes (Signed)
   Subjective:    Patient ID: Bill [name], male    DOB: 02-19-1941, 81 y.o.   MRN: [id number]  HPI Here to follow up on intermittent blood in his semen. He was referred to Dr. Matilde Sprang at Urology for this in 2020, and a CT scan was performed which was unremarkable. No signs of infection were found. There was talk about doing a cystoscopy, but for some reason this was not done. He is here today saying that this still happens from time to time, and he still has the same chronic mild lower abdominal pain he has had for years. No urinary issues. BMs are regular. He is a former smoker. He notes that his son (also a smoker) is beinf treated for bladder cancer. His UA today is clear.    Review of Systems  Constitutional: Negative.   Respiratory: Negative.    Cardiovascular: Negative.   Gastrointestinal: Negative.   Genitourinary:  Negative for difficulty urinating, dysuria, flank pain, frequency, hematuria and testicular pain.       Objective:   Physical Exam Constitutional:      Appearance: Normal appearance.  Cardiovascular:     Rate and Rhythm: Normal rate and regular rhythm.     Pulses: Normal pulses.     Heart sounds: Normal heart sounds.  Pulmonary:     Effort: Pulmonary effort is normal.     Breath sounds: Normal breath sounds.  Abdominal:     General: Abdomen is flat. Bowel sounds are normal. There is no distension.     Palpations: Abdomen is soft. There is no mass.     Tenderness: There is no abdominal tenderness. There is no right CVA tenderness, left CVA tenderness, guarding or rebound.     Hernia: No hernia is present.  Neurological:     Mental Status: He is alert.           Assessment & Plan:  Hematospermia. We will refer him back to Urology. Hopefully he can get the cystoscopy this time.  Alysia Penna, MD

## 2022-05-18 NOTE — Addendum Note (Signed)
Addended by: Wyvonne Lenz on: 05/18/2022 04:10 PM   Modules accepted: Orders

## 2022-05-22 ENCOUNTER — Other Ambulatory Visit: Payer: Self-pay

## 2022-05-22 ENCOUNTER — Inpatient Hospital Stay: Payer: Medicare HMO | Attending: Hematology and Oncology | Admitting: Hematology and Oncology

## 2022-05-22 ENCOUNTER — Inpatient Hospital Stay: Payer: Medicare HMO

## 2022-05-22 ENCOUNTER — Other Ambulatory Visit: Payer: Self-pay | Admitting: Hematology and Oncology

## 2022-05-22 VITALS — BP 140/72 | HR 53 | Temp 97.7°F | Resp 17 | Wt 166.3 lb

## 2022-05-22 DIAGNOSIS — Z87891 Personal history of nicotine dependence: Secondary | ICD-10-CM | POA: Diagnosis not present

## 2022-05-22 DIAGNOSIS — Z8042 Family history of malignant neoplasm of prostate: Secondary | ICD-10-CM | POA: Diagnosis not present

## 2022-05-22 DIAGNOSIS — Z800 Family history of malignant neoplasm of digestive organs: Secondary | ICD-10-CM | POA: Insufficient documentation

## 2022-05-22 DIAGNOSIS — D471 Chronic myeloproliferative disease: Secondary | ICD-10-CM

## 2022-05-22 DIAGNOSIS — Z85828 Personal history of other malignant neoplasm of skin: Secondary | ICD-10-CM | POA: Diagnosis not present

## 2022-05-22 DIAGNOSIS — Z7982 Long term (current) use of aspirin: Secondary | ICD-10-CM | POA: Diagnosis not present

## 2022-05-22 DIAGNOSIS — J439 Emphysema, unspecified: Secondary | ICD-10-CM | POA: Diagnosis not present

## 2022-05-22 DIAGNOSIS — D72829 Elevated white blood cell count, unspecified: Secondary | ICD-10-CM | POA: Insufficient documentation

## 2022-05-22 DIAGNOSIS — D72825 Bandemia: Secondary | ICD-10-CM

## 2022-05-22 DIAGNOSIS — Z8616 Personal history of COVID-19: Secondary | ICD-10-CM | POA: Diagnosis not present

## 2022-05-22 LAB — CBC WITH DIFFERENTIAL (CANCER CENTER ONLY)
Abs Immature Granulocytes: 0.13 10*3/uL — ABNORMAL HIGH (ref 0.00–0.07)
Basophils Absolute: 0.3 10*3/uL — ABNORMAL HIGH (ref 0.0–0.1)
Basophils Relative: 2 %
Eosinophils Absolute: 0.7 10*3/uL — ABNORMAL HIGH (ref 0.0–0.5)
Eosinophils Relative: 4 %
HCT: 45.5 % (ref 39.0–52.0)
Hemoglobin: 15.4 g/dL (ref 13.0–17.0)
Immature Granulocytes: 1 %
Lymphocytes Relative: 15 %
Lymphs Abs: 2.3 10*3/uL (ref 0.7–4.0)
MCH: 30.9 pg (ref 26.0–34.0)
MCHC: 33.8 g/dL (ref 30.0–36.0)
MCV: 91.2 fL (ref 80.0–100.0)
Monocytes Absolute: 0.6 10*3/uL (ref 0.1–1.0)
Monocytes Relative: 4 %
Neutro Abs: 11.2 10*3/uL — ABNORMAL HIGH (ref 1.7–7.7)
Neutrophils Relative %: 74 %
Platelet Count: 301 10*3/uL (ref 150–400)
RBC: 4.99 MIL/uL (ref 4.22–5.81)
RDW: 16.4 % — ABNORMAL HIGH (ref 11.5–15.5)
WBC Count: 15.3 10*3/uL — ABNORMAL HIGH (ref 4.0–10.5)
nRBC: 0 % (ref 0.0–0.2)

## 2022-05-22 LAB — CMP (CANCER CENTER ONLY)
ALT: 40 U/L (ref 0–44)
AST: 34 U/L (ref 15–41)
Albumin: 3.7 g/dL (ref 3.5–5.0)
Alkaline Phosphatase: 132 U/L — ABNORMAL HIGH (ref 38–126)
Anion gap: 5 (ref 5–15)
BUN: 11 mg/dL (ref 8–23)
CO2: 27 mmol/L (ref 22–32)
Calcium: 8.7 mg/dL — ABNORMAL LOW (ref 8.9–10.3)
Chloride: 103 mmol/L (ref 98–111)
Creatinine: 0.75 mg/dL (ref 0.61–1.24)
GFR, Estimated: >60 mL/min (ref >60)
Glucose, Bld: 442 mg/dL — ABNORMAL HIGH (ref 70–99)
Potassium: 4.1 mmol/L (ref 3.5–5.1)
Sodium: 135 mmol/L (ref 135–145)
Total Bilirubin: 0.6 mg/dL (ref 0.3–1.2)
Total Protein: 6.3 g/dL — ABNORMAL LOW (ref 6.5–8.1)

## 2022-05-23 NOTE — Progress Notes (Signed)
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 Lorenso Courier. WBC 16.4, Hgb 15.6, MCV 96.7, Plt 294. MPN panel returns JAK2 positive 6) 11/13/2020: WBC 16.0, Hgb 13.5, MCV 94.4, Plt 392 6) 05/22/2022: WBC 15.3, Hgb 15.4, MCV 91.2, Plt 301  Interval History:  Bill [name] 81 y.o. male with medical history significant for JAK2 positive leukocytosis who presents for a follow up visit. The patient's last visit was on 05/23/2021. In the interim since the last visit he has no major changes in his health.  On exam today Bill [name] is accompanied by his wife.  He reports he has been well overall in the interim since our last visit.  He has had some issues with hematospermia.  He is having blood in his ejaculate.  He has a upcoming appointment with urology to have this evaluated.  He reports the blood is dark-colored and appears brown.  He notes he also has an ache in his lower abdomen which occurs "all the time".  He notes that he is otherwise been quite well.  Is not having any issues with his day-to-day activities.  He currently denies having any issues with fevers, chills, sweats, nausea, vomiting or diarrhea.  His weight has been stable and his appetite is been good.  A full 10 point ROS is listed below.  MEDICAL HISTORY:  Past Medical History:  Diagnosis Date   Abnormal nuclear stress test 06/02/2019   Acute respiratory failure due to COVID-19 [facility]) 10/17/2020   Anemia    Angina pectoris ([place]) 06/02/2019    Formatting of this note might be different from the original. 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 Abnormal EGD 8/17   Headache 03/13/8415   Helicobacter pylori gastritis 07/21/2016   Medicare annual wellness visit, initial 01/05/2017   Formatting of this note might be different from the original. 3/18   Mixed dyslipidemia 01/12/2020   Neck pain 02/16/2020   Other chronic pain 09/02/2020   Penicillin allergy 07/21/2016   Pneumonia    Pneumonia due to COVID-19 virus 10/17/2020   Pulmonary nodule 02/28/2014   Formatting of this note might be different from the original. RML, no change   Right groin pain 06/16/2019   Skin cancer    Trigeminal neuralgia 07/26/2020   Type 2 diabetes mellitus without complication, without long-term current use of insulin ([place]) 04/11/2021    SURGICAL HISTORY: Past Surgical History:  Procedure Laterality Date   APPENDECTOMY  back fusion     cataract surgery Left    CHOLECYSTECTOMY     COLONOSCOPY  2014   clear    LEFT HEART CATH AND CORONARY ANGIOGRAPHY N/A 06/09/2019   Procedure: LEFT HEART CATH AND CORONARY ANGIOGRAPHY;  Surgeon: Jettie Booze, MD;  Location: [facility];  Service: Cardiovascular;  Laterality: N/A;   SPINE SURGERY  1984   thoracic spine fusion     SOCIAL HISTORY: Social History   Socioeconomic History    Marital status: Significant Other    Spouse name: Not on file   Number of children: 3   Years of education: 7   Highest education level: Not on file  Occupational History   Occupation: retired    Fish farm manager: [employer].  Tobacco Use   Smoking status: Former    Packs/day: 1.50    Years: 64.00    Total pack years: 96.00    Types: Cigarettes    Quit date: 2007    Years since quitting: 16.5   Smokeless tobacco: Never  Vaping Use   Vaping Use: Never used  Substance and Sexual Activity   Alcohol use: No   Drug use: No   Sexual activity: Not on file  Other Topics Concern   Not on file  Social History Narrative   Lives with significant other in a 2 story home.  Has 3 children.  Retired.  Education: 7th grade.    Social Determinants of Health   Financial Resource Strain: Low Risk  (02/20/2022)   Overall Financial Resource Strain (CARDIA)    Difficulty of Paying Living Expenses: Not hard at all  Food Insecurity: No Food Insecurity (02/20/2022)   Hunger Vital Sign    Worried About Running Out of Food in the Last Year: Never true    Ran Out of Food in the Last Year: Never true  Transportation Needs: No Transportation Needs (02/20/2022)   PRAPARE - Hydrologist (Medical): No    Lack of Transportation (Non-Medical): No  Physical Activity: Sufficiently Active (02/20/2022)   Exercise Vital Sign    Days of Exercise per Week: 5 days    Minutes of Exercise per Session: 150+ min  Stress: No Stress Concern Present (02/20/2022)   [place]    Feeling of Stress : Not at all  Social Connections: Socially Isolated (02/20/2022)   Social Connection and Isolation Panel [NHANES]    Frequency of Communication with Friends and Family: More than three times a week    Frequency of Social Gatherings with Friends and Family: More than three times a week    Attends Religious Services: Never    Building surveyor or Organizations: No    Attends Archivist Meetings: Never    Marital Status: Never married  Intimate Partner Violence: Not At Risk (02/20/2022)   Humiliation, Afraid, Rape, and Kick questionnaire    Fear of Current or Ex-Partner: No    Emotionally Abused: No    Physically Abused: No    Sexually Abused: No    FAMILY HISTORY: Family History  Problem Relation Age of Onset   Colon cancer Mother    Diabetes Father    Heart disease Father    Stroke Father    Hypertension Father    Hyperlipidemia Father    Heart disease Brother    Pancreatic cancer Cousin        x 2   Prostate  cancer Cousin    Heart disease Son     ALLERGIES:  is allergic to aspirin, oxycodone, penicillins, percocet [oxycodone-acetaminophen], ciprofloxacin, and prednisone.  MEDICATIONS:  Current Outpatient Medications  Medication Sig Dispense Refill   Accu-Chek Softclix Lancets lancets 100 each by Other route 2 times daily at 12 noon and 4 pm. Use as instructed 100 each 12   acetaminophen (TYLENOL) 325 MG tablet Take 2 tablets (650 mg total) by mouth every 6 (six) hours as needed for mild pain (or Fever >/= 101).     Ascorbic Acid (VITAMIN C) 100 MG tablet Take 100 mg by mouth daily.     aspirin 81 MG tablet Take 81 mg by mouth daily.     bisoprolol (ZEBETA) 5 MG tablet TAKE 1/2 TABLET (2.5 MG) BY MOUTH EVERY OTHER DAY 45 tablet 3   Blood Glucose Monitoring Suppl (ACCU-CHEK GUIDE) w/Device KIT 1 kit by Other route 2 (two) times daily after a meal. 1 kit 0   Calcium Citrate-Vitamin D (CALCIUM CITRATE +D PO) Take 1 tablet by mouth daily.      cyanocobalamin 1000 MCG tablet Take 1,000 mcg by mouth daily.      cyclobenzaprine (FLEXERIL) 10 MG tablet Take 1 tablet (10 mg total) by mouth 3 (three) times daily as needed for muscle spasms. 60 tablet 0   fexofenadine (ALLEGRA) 180 MG tablet Take 1 tablet (180 mg total) by mouth daily. 90 tablet 2   finasteride (PROSCAR) 5 MG tablet Take 5 mg by  mouth daily.     Fluticasone-Umeclidin-Vilant (TRELEGY ELLIPTA) 100-62.5-25 MCG/INH AEPB Inhale 1 puff into the lungs daily. 1 each 0   glucose blood test strip 100 each by Other route 2 times daily at 12 noon and 4 pm. Use as instructed 100 each 12   insulin glargine (LANTUS SOLOSTAR) 100 UNIT/ML Solostar Pen Inject 18 Units into the skin every morning. And pen needles 1/day 30 mL 3   Ipratropium-Albuterol (COMBIVENT) 20-100 MCG/ACT AERS respimat Inhale 1 puff into the lungs 4 (four) times daily as needed for wheezing. 4 g 1   nitroGLYCERIN (NITROSTAT) 0.4 MG SL tablet PLACE 1 TABLET (0.4 MG TOTAL) UNDER THE TONGUE EVERY 5 (FIVE) MINUTES AS NEEDED FOR CHEST PAIN. 25 tablet 1   RELION PEN NEEDLES 32G X 4 MM MISC USE DAILY AT 2PM 100 each 0   Vitamin E 400 units TABS Take 400 Units by mouth daily.     Zinc 100 MG TABS Take 100 mg by mouth daily.     No current facility-administered medications for this visit.    REVIEW OF SYSTEMS:   Constitutional: ( - ) fevers, ( - )  chills , ( - ) night sweats Eyes: ( - ) blurriness of vision, ( - ) double vision, ( - ) watery eyes Ears, nose, mouth, throat, and face: ( - ) mucositis, ( - ) sore throat Respiratory: ( - ) cough, ( - ) dyspnea, ( - ) wheezes Cardiovascular: ( - ) palpitation, ( - ) chest discomfort, ( - ) lower extremity swelling Gastrointestinal:  ( - ) nausea, ( - ) heartburn, ( - ) change in bowel habits Skin: ( - ) abnormal skin rashes Lymphatics: ( - ) new lymphadenopathy, ( - ) easy bruising Neurological: ( - ) numbness, ( - ) tingling, ( - ) new weaknesses Behavioral/Psych: ( - ) mood change, ( - ) new changes  All other systems were reviewed with the patient and are negative.  PHYSICAL  EXAMINATION: ECOG PERFORMANCE STATUS: 0 - Asymptomatic  Vitals:   05/22/22 1452  BP: 140/72  Pulse: (!) 53  Resp: 17  Temp: 97.7 F (36.5 C)  SpO2: 97%    Filed Weights   05/22/22 1452  Weight: 166 lb 4.8 oz (75.4 kg)     GENERAL:  well appearing elderly Caucasian male. alert, no distress and comfortable SKIN: skin color, texture, turgor are normal, no rashes or significant lesions EYES: conjunctiva are pink and non-injected, sclera clear LUNGS: clear to auscultation and percussion with normal breathing effort HEART: regular rate & rhythm and no murmurs and no lower extremity edema Musculoskeletal: no cyanosis of digits and no clubbing  PSYCH: alert & oriented x 3, fluent speech NEURO: no focal motor/sensory deficits  LABORATORY DATA:  I have reviewed the data as listed    Latest Ref Rng & Units 05/22/2022    2:35 PM 05/23/2021    2:28 PM 11/13/2020    3:13 PM  CBC  WBC 4.0 - 10.5 K/uL 15.3  17.9  16.0   Hemoglobin 13.0 - 17.0 g/dL 15.4  13.7  13.5   Hematocrit 39.0 - 52.0 % 45.5  41.7  40.1   Platelets 150 - 400 K/uL 301  381  392        Latest Ref Rng & Units 05/22/2022    2:35 PM 05/23/2021    2:28 PM 04/11/2021   11:19 AM  CMP  Glucose 70 - 99 mg/dL 442  239  413   BUN 8 - 23 mg/dL _0 Creatinine 0.61 - 1.24 mg/dL 0.75  0.89  0.75   Sodium 135 - 145 mmol/L 135  140  132   Potassium 3.5 - 5.1 mmol/L 4.1  4.1  4.1   Chloride 98 - 111 mmol/L 103  106  98   CO2 22 - 32 mmol/L _1 Calcium 8.9 - 10.3 mg/dL 8.7  9.5  9.2   Total Protein 6.5 - 8.1 g/dL 6.3  6.7    Total Bilirubin 0.3 - 1.2 mg/dL 0.6  0.5    Alkaline Phos 38 - 126 U/L 132  147    AST 15 - 41 U/L 34  40    ALT 0 - 44 U/L 40  55      RADIOGRAPHIC STUDIES: No results found.   ASSESSMENT & PLAN Bill [name] 81 y.o. male with medical history significant for JAK2 positive leukocytosis who presents for a follow up visit.  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  The exact myeloproliferative neoplasm is not clear at this time as the patient only has an elevation of white blood cell  count with no elevation in his platelets or red blood cells.  Given this the patient does not require cytoreductive therapy and can be managed on baby aspirin alone, which he is already taking for his cardiac disease.  Technically in order to confirm the diagnosis of an MPN a bone marrow biopsy is required.  In this case it would confirm the diagnosis, however it would not change our management.  As his counts are currently within normal limits he would not require hydroxyurea or other treatments in order to lower the counts.  As such the bone marrow biopsy will be predominantly academic.  Myelofibrosis is also a consideration, however given  that there is no disease altering treatment we could wait until there was a drop in his blood counts before considering the bone marrow biopsy.  Overall I think we can continue with observation in order to assure that the patient does not convert into a leukemia or a myelofibrosis with cytopenias.  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 He has an MPN, but the exact one is not clear based off the peripheral blood finds.  --a bone marrow biopsy can be done to confirm a diagnosis, but with no elevated RBC or Plt counts we would not start cytoreductive therapy. The utility the bone marrow is for diagnosis only, it would not change the course of treatment --myelofibrosis is certainly on the differential, but as there is no treatment that alters the course of the disease it would be reasonable to hold until the patient began developing cytopenias before considering bone marrow biopsy.  Additionally has had remarkably stable counts for over a decade. --continue ASA 58m PO daily (patient takes for cardiac disease) -- Labs today show white blood cell count 15.3, hemoglobin 15.4, MCV 45.5, and platelets of 301 --RTC in 12 months for continued monitoring   No orders of the defined types were  placed in this encounter.   All questions were answered. The patient knows to call the clinic with any problems, questions or concerns.  A total of more than 30 minutes were spent on this encounter and over half of that time was spent on counseling and coordination of care as outlined above.   JLedell Peoples MD Department of Hematology/Oncology [facility]Phone: [phone]: [phone]: [email or web address]  05/23/2022 6:59 PM

## 2022-05-26 ENCOUNTER — Ambulatory Visit: Payer: Medicare HMO | Admitting: Internal Medicine

## 2022-05-26 NOTE — Progress Notes (Deleted)
Patient ID: Bill [name], male   DOB: 1941/08/28, 81 y.o.   MRN: [id number]  HPI: Bill [name] is a 81 y.o.-year-old male, returning for follow-up for DM2, dx in 2019, insulin-dependent, uncontrolled, with complications (CAD, PAD). Pt. previously saw Dr. Loanne Drilling, last visit 5.5 mo ago.  He has a history of myeloproliferative neoplasm, for which he sees oncology.  At his most recent visit from 05/22/2022, glucose was 442.  Reviewed HbA1c: Lab Results  Component Value Date   HGBA1C 7.8 (A) 12/11/2021   HGBA1C 6.9 (A) 08/27/2021   HGBA1C 7.2 (A) 06/24/2021   HGBA1C 7.8 (A) 06/11/2021   HGBA1C 16.8 Repeated and verified X2. (H) 04/11/2021   HGBA1C 7.9 (H) 10/18/2020   HGBA1C 7.4 (H) 04/16/2020   HGBA1C 15.0 (H) 03/28/2019   Pt is on a regimen of: - Lantus 18 units at bedtime  Pt checks his sugars *** a day and they are: - am: n/c - 2h after b'fast: n/c - before lunch: n/c - 2h after lunch: n/c - before dinner: n/c - 2h after dinner: n/c - bedtime: n/c - nighttime: n/c Lowest sugar was ***; he has hypoglycemia awareness at 70.  Highest sugar was ***.  Glucometer: Accu-Chek guide  Pt's meals are: - Breakfast: - Lunch: - Dinner: - Snacks:  - no CKD, last BUN/creatinine:  Lab Results  Component Value Date   BUN 11 05/22/2022   BUN 12 05/23/2021   CREATININE 0.75 05/22/2022   CREATININE 0.89 05/23/2021  He is not on ACE inhibitor/ARB.  -+ HL; last set of lipids: Lab Results  Component Value Date   CHOL 182 04/16/2020   HDL 39.70 04/16/2020   LDLCALC 117 (H) 04/16/2020   TRIG 63 10/17/2020   CHOLHDL 5 04/16/2020  Previously on Lipitor 10 mg daily, now off because of increased LFTs with statins in the past.  - last eye exam was on 05/06/2022: No DR. Records in Care everywhere.  - no numbness and tingling in his feet.  Last foot exam 06/24/2021.  He sees podiatry.  He is on ASA 81.  He has a history of COPD, anemia, GERD, headache, neck pain, pulmonary  nodule.  ROS: + see HPI No increased urination, blurry vision, nausea, chest pain.  Past Medical History:  Diagnosis Date   Abnormal nuclear stress test 06/02/2019   Acute respiratory failure due to COVID-19 [facility]) 10/17/2020   Anemia    Angina pectoris ([place]) 06/02/2019   Formatting of this note might be different from the original. 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 Abnormal EGD 8/17   Headache 7/42/5956   Helicobacter pylori gastritis 07/21/2016   Medicare annual wellness visit, initial 01/05/2017   Formatting of this note might be different from the original. 3/18   Mixed dyslipidemia 01/12/2020   Neck pain 02/16/2020   Other chronic pain 09/02/2020   Penicillin allergy 07/21/2016   Pneumonia    Pneumonia  due to COVID-19 virus 10/17/2020   Pulmonary nodule 02/28/2014   Formatting of this note might be different from the original. RML, no change   Right groin pain 06/16/2019   Skin cancer    Trigeminal neuralgia 07/26/2020   Type 2 diabetes mellitus without complication, without long-term current use of insulin ([place]) 04/11/2021   Past Surgical History:  Procedure Laterality Date   APPENDECTOMY     back fusion     cataract surgery Left    CHOLECYSTECTOMY     COLONOSCOPY  2014   clear    LEFT HEART CATH AND CORONARY ANGIOGRAPHY N/A 06/09/2019   Procedure: LEFT HEART  CATH AND CORONARY ANGIOGRAPHY;  Surgeon: Jettie Booze, MD;  Location: [place] [facility];  Service: Cardiovascular;  Laterality: N/A;   SPINE SURGERY  1984   thoracic spine fusion    Social History   Socioeconomic History   Marital status: Significant Other    Spouse name: Not on file   Number of children: 3   Years of education: 7   Highest education level: Not on file  Occupational History   Occupation: retired    Fish farm manager: [employer].  Tobacco Use   Smoking status: Former    Packs/day: 1.50    Years: 64.00    Total pack years: 96.00    Types: Cigarettes    Quit date: 2007    Years since quitting: 16.5   Smokeless tobacco: Never  Vaping Use   Vaping Use: Never used  Substance and Sexual Activity   Alcohol use: No   Drug use: No   Sexual activity: Not on file  Other Topics Concern   Not on file  Social History Narrative   Lives with significant other in a 2 story home.  Has 3 children.  Retired.  Education: 7th grade.    Social Determinants of Health   Financial Resource Strain: Low Risk  (02/20/2022)   Overall Financial Resource Strain (CARDIA)    Difficulty of Paying Living Expenses: Not hard at all  Food Insecurity: No Food Insecurity (02/20/2022)   Hunger Vital Sign    Worried About Running Out of Food in the Last Year: Never true    Ran Out of Food in the Last Year: Never true  Transportation Needs: No Transportation Needs (02/20/2022)   PRAPARE - Hydrologist (Medical): No    Lack of Transportation (Non-Medical): No  Physical Activity: Sufficiently Active (02/20/2022)   Exercise Vital Sign    Days of Exercise per Week: 5 days    Minutes of Exercise per Session: 150+ min  Stress: No Stress Concern Present (02/20/2022)   [place]ce    Feeling of Stress : Not at all  Social Connections: Socially Isolated (02/20/2022)   Social Connection and Isolation  Panel [NHANES]    Frequency of Communication with Friends and Family: More than three times a week    Frequency of Social Gatherings with Friends and Family: More than three times a week    Attends Religious Services: Never    Marine scientist or Organizations: No    Attends Archivist Meetings: Never    Marital Status: Never married  Intimate Partner Violence: Not At Risk (02/20/2022)   Humiliation, Afraid, Rape, and Kick questionnaire    Fear of Current or Ex-Partner: No    Emotionally Abused: No    Physically Abused: No    Sexually  Abused: No   Current Outpatient Medications on File Prior to Visit  Medication Sig Dispense Refill   Accu-Chek Softclix Lancets lancets 100 each by Other route 2 times daily at 12 noon and 4 pm. Use as instructed 100 each 12   acetaminophen (TYLENOL) 325 MG tablet Take 2 tablets (650 mg total) by mouth every 6 (six) hours as needed for mild pain (or Fever >/= 101).     Ascorbic Acid (VITAMIN C) 100 MG tablet Take 100 mg by mouth daily.     aspirin 81 MG tablet Take 81 mg by mouth daily.     bisoprolol (ZEBETA) 5 MG tablet TAKE 1/2 TABLET (2.5 MG) BY MOUTH EVERY OTHER DAY 45 tablet 3   Blood Glucose Monitoring Suppl (ACCU-CHEK GUIDE) w/Device KIT 1 kit by Other route 2 (two) times daily after a meal. 1 kit 0   Calcium Citrate-Vitamin D (CALCIUM CITRATE +D PO) Take 1 tablet by mouth daily.      cyanocobalamin 1000 MCG tablet Take 1,000 mcg by mouth daily.      cyclobenzaprine (FLEXERIL) 10 MG tablet Take 1 tablet (10 mg total) by mouth 3 (three) times daily as needed for muscle spasms. 60 tablet 0   fexofenadine (ALLEGRA) 180 MG tablet Take 1 tablet (180 mg total) by mouth daily. 90 tablet 2   finasteride (PROSCAR) 5 MG tablet Take 5 mg by mouth daily.     Fluticasone-Umeclidin-Vilant (TRELEGY ELLIPTA) 100-62.5-25 MCG/INH AEPB Inhale 1 puff into the lungs daily. 1 each 0   glucose blood test strip 100 each by Other route 2 times daily at 12 noon  and 4 pm. Use as instructed 100 each 12   insulin glargine (LANTUS SOLOSTAR) 100 UNIT/ML Solostar Pen Inject 18 Units into the skin every morning. And pen needles 1/day 30 mL 3   Ipratropium-Albuterol (COMBIVENT) 20-100 MCG/ACT AERS respimat Inhale 1 puff into the lungs 4 (four) times daily as needed for wheezing. 4 g 1   nitroGLYCERIN (NITROSTAT) 0.4 MG SL tablet PLACE 1 TABLET (0.4 MG TOTAL) UNDER THE TONGUE EVERY 5 (FIVE) MINUTES AS NEEDED FOR CHEST PAIN. 25 tablet 1   RELION PEN NEEDLES 32G X 4 MM MISC USE DAILY AT 2PM 100 each 0   Vitamin E 400 units TABS Take 400 Units by mouth daily.     Zinc 100 MG TABS Take 100 mg by mouth daily.     [DISCONTINUED] atorvastatin (LIPITOR) 10 MG tablet Take 1 tablet (10 mg total) by mouth daily. (Patient not taking: Reported on 09/26/2020) 90 tablet 2   No current facility-administered medications on file prior to visit.   Allergies  Allergen Reactions   Aspirin     Takes ASA 20m at home.   Oxycodone Nausea Only    PERCOCET- weakness, dizziness, shaking, nausea   Penicillins Hives    Did it involve swelling of the face/tongue/throat, SOB, or low BP? No Did it involve sudden or severe rash/hives, skin peeling, or any reaction on the inside of your mouth or nose? Yes Did you need to seek medical attention at a hospital or doctor's office? Yes When did it last happen?      long time If all above answers are "NO", may proceed with cephalosporin use.    Percocet [Oxycodone-Acetaminophen]    Ciprofloxacin Rash   Prednisone Palpitations   Family History  Problem Relation Age of Onset   Colon cancer Mother    Diabetes Father    Heart disease Father  Stroke Father    Hypertension Father    Hyperlipidemia Father    Heart disease Brother    Pancreatic cancer Cousin        x 2   Prostate cancer Cousin    Heart disease Son     PE: There were no vitals taken for this visit. Wt Readings from Last 3 Encounters:  05/22/22 166 lb 4.8 oz (75.4 kg)   05/18/22 162 lb 6 oz (73.7 kg)  02/20/22 169 lb 1.6 oz (76.7 kg)   Constitutional:normal weight, in NAD Eyes: no exophthalmos ENT: moist mucous membranes, no thyromegaly, no cervical lymphadenopathy Cardiovascular: RRR, No MRG Respiratory: CTA B Musculoskeletal: no deformities Skin: moist, warm, no rashes Neurological: no tremor with outstretched hands  ASSESSMENT: 1. DM2, insulin-dependent, uncontrolled, without long-term complications  2. HL  PLAN:  1. Patient with long-standing, uncontrolled diabetes, on basal insulin only, with still poor control.  Latest HbA1c from 5 months ago was 7.8%, higher.  A glucose level obtained 2 days ago was higher than 400s!  At today's visit, HbA1c is **. -  - I suggested to:  There are no Patient Instructions on file for this visit. - check sugars at different times of the day - check 1x a day, rotating checks - discussed about CBG targets for treatment: 80-130 mg/dL before meals and <180 mg/dL after meals; target HbA1c <7%. - given sugar log and advised how to fill it and to bring it at next appt  - given foot care handout  - given instructions for hypoglycemia management "15-15 rule"  - advised for yearly eye exams  - Return to clinic in 3 mo with sugar log   2. HL - Reviewed latest lipid panel from 2021: LDL above target Lab Results  Component Value Date   CHOL 182 04/16/2020   HDL 39.70 04/16/2020   LDLCALC 117 (H) 04/16/2020   TRIG 63 10/17/2020   CHOLHDL 5 04/16/2020  -He was on Lipitor 10 mg daily but stopped due to increased LFTs.Philemon Kingdom, MD PhD [facility] Endocrinology

## 2022-06-10 ENCOUNTER — Telehealth: Payer: Self-pay | Admitting: Pharmacist

## 2022-06-10 NOTE — Chronic Care Management (AMB) (Signed)
Chronic Care Management Pharmacy Assistant   Name: Bill [name]  MRN: [id number] DOB: 10-11-1941  Reason for Encounter: Disease State   Conditions to be addressed/monitored: General Assessment  Recent office visits:  05/18/22 [name], MD - Patient presented for Hematospermia. No medication changes.  02/20/22 Criselda Peaches, LPN - Patient presented for Medicare Annual Wellness Exam. Patient voiced goal of Increase lin lean proteins to help with blood sugars. No medication changes.  Recent consult visits:  05/22/22 [name], MD (Hematology & Oncology) - Patient presented for MPN and other concerns. No medication changes.  02/19/22 [name], [name], MD (Cardiology) - Patient presented for CAD involving native Coronary Artery of native heart without angina pectoris and other concerns. No medication changes.  02/04/22 [facility] - Claims encounter for Allergic rhinitis. No other visit details available.  01/29/22 [name], [name] (Derm) - Claims encounter for Personal history of other malignant neoplasm of skin and other concerns. No other visit details available.  Hospital visits:  None in previous 6 months  Medications: Outpatient Encounter Medications as of 06/10/2022  Medication Sig   Accu-Chek Softclix Lancets lancets 100 each by Other route 2 times daily at 12 noon and 4 pm. Use as instructed   acetaminophen (TYLENOL) 325 MG tablet Take 2 tablets (650 mg total) by mouth every 6 (six) hours as needed for mild pain (or Fever >/= 101).   Ascorbic Acid (VITAMIN C) 100 MG tablet Take 100 mg by mouth daily.   aspirin 81 MG tablet Take 81 mg by mouth daily.   bisoprolol (ZEBETA) 5 MG tablet TAKE 1/2 TABLET (2.5 MG) BY MOUTH EVERY OTHER DAY   Blood Glucose Monitoring Suppl (ACCU-CHEK GUIDE) w/Device KIT 1 kit by Other route 2 (two) times daily after a meal.   Calcium Citrate-Vitamin D (CALCIUM CITRATE +D PO) Take 1 tablet by mouth daily.     cyanocobalamin 1000 MCG tablet Take 1,000 mcg by mouth daily.    cyclobenzaprine (FLEXERIL) 10 MG tablet Take 1 tablet (10 mg total) by mouth 3 (three) times daily as needed for muscle spasms.   fexofenadine (ALLEGRA) 180 MG tablet Take 1 tablet (180 mg total) by mouth daily.   finasteride (PROSCAR) 5 MG tablet Take 5 mg by mouth daily.   Fluticasone-Umeclidin-Vilant (TRELEGY ELLIPTA) 100-62.5-25 MCG/INH AEPB Inhale 1 puff into the lungs daily.   glucose blood test strip 100 each by Other route 2 times daily at 12 noon and 4 pm. Use as instructed   insulin glargine (LANTUS SOLOSTAR) 100 UNIT/ML Solostar Pen Inject 18 Units into the skin every morning. And pen needles 1/day   Ipratropium-Albuterol (COMBIVENT) 20-100 MCG/ACT AERS respimat Inhale 1 puff into the lungs 4 (four) times daily as needed for wheezing.   nitroGLYCERIN (NITROSTAT) 0.4 MG SL tablet PLACE 1 TABLET (0.4 MG TOTAL) UNDER THE TONGUE EVERY 5 (FIVE) MINUTES AS NEEDED FOR CHEST PAIN.   RELION PEN NEEDLES 32G X 4 MM MISC USE DAILY AT 2PM   Vitamin E 400 units TABS Take 400 Units by mouth daily.   Zinc 100 MG TABS Take 100 mg by mouth daily.   [DISCONTINUED] atorvastatin (LIPITOR) 10 MG tablet Take 1 tablet (10 mg total) by mouth daily. (Patient not taking: Reported on 09/26/2020)   No facility-administered encounter medications on file as of 06/10/2022.   Contacted Kathlene November for General Review Call  Adherence Review:  Does the Clinical Pharmacist Assistant have access to adherence  rates? Yes Adherence rates for STAR metric medications : No Star rated medications  Disease State Questions:  Able to connect with Patient? No   Care Gaps: COVID Booster - Overdue Eye Exam - Overdue Urine Micro - Overdue Hep C Screening - Overdue Zoster Vaccine - Overdue Flu Vacine - Overdue HGB A1C - Overdue PNA Vaccine - Postponed CCM- Need BP- 140/72 05/22/22 AWV- 4/23 Lab Results  Component Value Date   HGBA1C 7.8 (A)  12/11/2021    Star Rating Drugs: None   Ned Clines [place] Clinical Pharmacist Assistant [phone]

## 2022-06-23 ENCOUNTER — Ambulatory Visit (INDEPENDENT_AMBULATORY_CARE_PROVIDER_SITE_OTHER): Payer: Medicare HMO | Admitting: Internal Medicine

## 2022-06-23 ENCOUNTER — Encounter: Payer: Self-pay | Admitting: Internal Medicine

## 2022-06-23 VITALS — BP 120/82 | HR 56 | Ht 67.0 in | Wt 165.4 lb

## 2022-06-23 DIAGNOSIS — E1165 Type 2 diabetes mellitus with hyperglycemia: Secondary | ICD-10-CM | POA: Diagnosis not present

## 2022-06-23 DIAGNOSIS — E1159 Type 2 diabetes mellitus with other circulatory complications: Secondary | ICD-10-CM | POA: Diagnosis not present

## 2022-06-23 DIAGNOSIS — E782 Mixed hyperlipidemia: Secondary | ICD-10-CM | POA: Diagnosis not present

## 2022-06-23 LAB — POCT GLYCOSYLATED HEMOGLOBIN (HGB A1C): Hemoglobin A1C: 10.5 % — AB (ref 4.0–5.6)

## 2022-06-23 MED ORDER — NOVOLOG FLEXPEN 100 UNIT/ML ~~LOC~~ SOPN: 6.0000 [IU] | PEN_INJECTOR | Freq: Two times a day (BID) | SUBCUTANEOUS | 3 refills | Status: DC

## 2022-06-23 NOTE — Progress Notes (Signed)
Patient ID: Bill [name], male   DOB: 09/24/1941, 80 y.o.   MRN: [id number]  HPI: Bill [name] is a 80 y.o.-year-old male, returning for follow-up for DM2, dx in 2019, insulin-dependent since 2022, uncontrolled, with complications (CAD, PAD). Pt. previously saw Dr. Ellison, last visit 6 mo ago. He is here with his lady partner (they have been together for 9 years).  She helps him with his medications since patient had memory loss.  He has a history of myeloproliferative neoplasm, for which he sees oncology.  At his most recent visit from 05/22/2022, glucose was 442.  She had an appointment with me right afterwards, but he missed it.  Reviewed HbA1c: Lab Results  Component Value Date   HGBA1C 7.8 (A) 12/11/2021   HGBA1C 6.9 (A) 08/27/2021   HGBA1C 7.2 (A) 06/24/2021   HGBA1C 7.8 (A) 06/11/2021   HGBA1C 16.8 Repeated and verified X2. (H) 04/11/2021   HGBA1C 7.9 (H) 10/18/2020   HGBA1C 7.4 (H) 04/16/2020   HGBA1C 15.0 (H) 03/28/2019   Pt is on a regimen of: - Lantus 60 >> 18 >> 30 units at bedtime Per Dr. Ellison's note, he was not started on multiple daily injections (NovoLog) due to memory loss. Metformin did not work for him in the past.   Pt checks his sugars 1x a day and they are: - am: 130-250 - 2h after brunch: n/c - before dinner: n/c - 2h after dinner: n/c - bedtime: n/c - nighttime: n/c Lowest sugar was 120; he has hypoglycemia awareness at 90.  Highest sugar was 400 - not recently.  Glucometer: Accu-Chek guide  Pt's meals are: - Brunch: cereal (corn flakes) + whole milk; oatmeal with milk and butter - Dinner: meat + veggies/salad + starch (potato, bisquit) - Snacks: "I have a huge sweet tooth" - chocolate, marshmallows, molasses, jelly, occasional regular sodas   - no CKD, last BUN/creatinine:  Lab Results  Component Value Date   BUN 11 05/22/2022   BUN 12 05/23/2021   CREATININE 0.75 05/22/2022   CREATININE 0.89 05/23/2021  He is not on ACE  inhibitor/ARB.  -+ HL; last set of lipids: Lab Results  Component Value Date   CHOL 182 04/16/2020   HDL 39.70 04/16/2020   LDLCALC 117 (H) 04/16/2020   TRIG 63 10/17/2020   CHOLHDL 5 04/16/2020  Previously on Lipitor 10 mg daily, now off because of increased LFTs with statins in the past.  - last eye exam was on 05/06/2021: No DR. Records in Care everywhere.  - no numbness and tingling in his feet.  Last foot exam 06/24/2021.  He saw podiatry in the past.  He is on ASA 81.  He has a history of COPD, anemia, GERD, headache, neck pain, pulmonary nodule. Also, he has LE varices. He had an abnormal stress test recently.  ROS: + see HPI No increased urination, blurry vision, nausea, chest pain.  Past Medical History:  Diagnosis Date   Abnormal nuclear stress test 06/02/2019   Acute respiratory failure due to COVID-19 (HCC) 10/17/2020   Anemia    Angina pectoris (HCC) 06/02/2019   Formatting of this note might be different from the original. 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 Abnormal EGD 8/17   Headache 9/67/5916   Helicobacter pylori gastritis 07/21/2016   Medicare annual wellness visit, initial 01/05/2017   Formatting of this note might be different from the original. 3/18   Mixed dyslipidemia 01/12/2020   Neck pain 02/16/2020   Other chronic pain 09/02/2020    Penicillin allergy 07/21/2016   Pneumonia    Pneumonia due to COVID-19 virus 10/17/2020   Pulmonary nodule 02/28/2014   Formatting of this note might be different from the original. RML, no change   Right groin pain 06/16/2019   Skin cancer    Trigeminal neuralgia 07/26/2020   Type 2 diabetes mellitus without complication, without long-term current use of insulin ([place]) 04/11/2021   Past Surgical History:  Procedure Laterality Date   APPENDECTOMY     back fusion     cataract surgery Left    CHOLECYSTECTOMY     COLONOSCOPY  2014   clear    LEFT HEART CATH AND CORONARY ANGIOGRAPHY N/A 06/09/2019   Procedure: LEFT HEART CATH AND CORONARY ANGIOGRAPHY;  Surgeon: Jettie Booze, MD;  Location: [facility];  Service: Cardiovascular;  Laterality: N/A;   SPINE SURGERY  1984   thoracic spine fusion    Social History   Socioeconomic History   Marital status: Significant Other    Spouse name: Not on file   Number of children: 3   Years of education: 7   Highest education level: Not on file  Occupational History   Occupation: retired    Fish farm manager: [employer].  Tobacco Use   Smoking status: Former    Packs/day: 1.50    Years: 64.00    Total pack years: 96.00    Types: Cigarettes    Quit date: 2007    Years since quitting: 16.6   Smokeless tobacco: Never  Vaping Use   Vaping Use: Never used  Substance and Sexual Activity   Alcohol use: No   Drug use: No   Sexual activity: Not on file  Other Topics Concern   Not on file  Social History Narrative   Lives with significant other in a 2 story home.  Has 3 children.  Retired.  Education: 7th grade.    Social Determinants of Health   Financial Resource Strain: Low Risk  (02/20/2022)   Overall Financial Resource Strain (CARDIA)    Difficulty of Paying Living Expenses: Not hard at all  Food Insecurity: No Food Insecurity (02/20/2022)   Hunger Vital Sign    Worried About Running Out of Food in the Last Year: Never true     Ran Out of Food in the Last Year: Never true  Transportation Needs: No Transportation Needs (02/20/2022)   PRAPARE - Hydrologist (Medical): No    Lack of Transportation (Non-Medical): No  Physical Activity: Sufficiently Active (02/20/2022)   Exercise Vital Sign    Days of Exercise per Week: 5 days    Minutes of Exercise per Session: 150+ min  Stress: No Stress Concern Present (02/20/2022)   [place]    Feeling of Stress : Not at all  Social Connections: Socially Isolated (02/20/2022)   Social  Connection and Isolation Panel [NHANES]    Frequency of Communication with Friends and Family: More than three times a week    Frequency of Social Gatherings with Friends and Family: More than three times a week    Attends Religious Services: Never    Marine scientist or Organizations: No    Attends Archivist Meetings: Never    Marital Status: Never married  Intimate Partner Violence: Not At Risk (02/20/2022)   Humiliation, Afraid, Rape, and Kick questionnaire    Fear of Current or Ex-Partner: No    Emotionally Abused: No    Physically Abused: No    Sexually Abused: No   Current Outpatient Medications on File Prior to Visit  Medication Sig Dispense Refill   Accu-Chek Softclix Lancets lancets 100 each by Other route 2 times daily at 12 noon and 4 pm. Use as instructed 100 each 12   acetaminophen (TYLENOL) 325 MG tablet Take 2 tablets (650 mg total) by mouth every 6 (six) hours as needed for mild pain (or Fever >/= 101).     Ascorbic Acid (VITAMIN C) 100 MG tablet Take 100 mg by mouth daily.     aspirin 81 MG tablet Take 81 mg by mouth daily.     bisoprolol (ZEBETA) 5 MG tablet TAKE 1/2 TABLET (2.5 MG) BY MOUTH EVERY OTHER DAY 45 tablet 3   Blood Glucose Monitoring Suppl (ACCU-CHEK GUIDE) w/Device KIT 1 kit by Other route 2 (two) times daily after a meal. 1 kit 0   Calcium  Citrate-Vitamin D (CALCIUM CITRATE +D PO) Take 1 tablet by mouth daily.      cyanocobalamin 1000 MCG tablet Take 1,000 mcg by mouth daily.      cyclobenzaprine (FLEXERIL) 10 MG tablet Take 1 tablet (10 mg total) by mouth 3 (three) times daily as needed for muscle spasms. 60 tablet 0   fexofenadine (ALLEGRA) 180 MG tablet Take 1 tablet (180 mg total) by mouth daily. 90 tablet 2   finasteride (PROSCAR) 5 MG tablet Take 5 mg by mouth daily.     Fluticasone-Umeclidin-Vilant (TRELEGY ELLIPTA) 100-62.5-25 MCG/INH AEPB Inhale 1 puff into the lungs daily. 1 each 0   glucose blood test strip 100 each by Other route 2 times daily at 12 noon and 4 pm. Use as instructed 100 each 12   insulin glargine (LANTUS SOLOSTAR) 100 UNIT/ML Solostar Pen Inject 18 Units into the skin every morning. And pen needles 1/day 30 mL 3   Ipratropium-Albuterol (COMBIVENT) 20-100 MCG/ACT AERS respimat Inhale 1 puff into the lungs 4 (four) times daily as needed for wheezing. 4 g 1   nitroGLYCERIN (NITROSTAT) 0.4 MG SL tablet PLACE 1 TABLET (0.4 MG TOTAL) UNDER THE TONGUE EVERY 5 (FIVE) MINUTES AS NEEDED FOR CHEST PAIN. 25 tablet 1   RELION PEN NEEDLES 32G X 4 MM MISC USE DAILY AT 2PM 100 each 0   Vitamin E 400 units TABS Take 400 Units by mouth daily.     Zinc 100 MG TABS Take 100 mg by mouth daily.     [DISCONTINUED] atorvastatin (LIPITOR) 10 MG tablet Take 1 tablet (10 mg total) by mouth daily. (Patient not taking: Reported on 09/26/2020) 90 tablet 2   No current facility-administered medications on file prior to visit.   Allergies  Allergen Reactions   Aspirin     Takes ASA 21m at home.   Oxycodone Nausea Only    PERCOCET- weakness, dizziness, shaking, nausea   Penicillins Hives  Did it involve swelling of the face/tongue/throat, SOB, or low BP? No Did it involve sudden or severe rash/hives, skin peeling, or any reaction on the inside of your mouth or nose? Yes Did you need to seek medical attention at a hospital or  doctor's office? Yes When did it last happen?      long time If all above answers are "NO", may proceed with cephalosporin use.    Percocet [Oxycodone-Acetaminophen]    Ciprofloxacin Rash   Prednisone Palpitations   Family History  Problem Relation Age of Onset   Colon cancer Mother    Diabetes Father    Heart disease Father    Stroke Father    Hypertension Father    Hyperlipidemia Father    Heart disease Brother    Pancreatic cancer Cousin        x 2   Prostate cancer Cousin    Heart disease Son    PE: BP 120/82 (BP Location: Right Arm, Patient Position: Sitting, Cuff Size: Normal)   Pulse (!) 56   Ht 5' 7" (1.702 m)   Wt 165 lb 6.4 oz (75 kg)   SpO2 96%   BMI 25.91 kg/m  Wt Readings from Last 3 Encounters:  06/23/22 165 lb 6.4 oz (75 kg)  05/22/22 166 lb 4.8 oz (75.4 kg)  05/18/22 162 lb 6 oz (73.7 kg)   Constitutional: normal weight, in NAD Eyes: no exophthalmos ENT: moist mucous membranes, no masses palpated in neck, no cervical lymphadenopathy Cardiovascular: RRR, No MRG Respiratory: CTA B Musculoskeletal: no deformities Skin: moist, warm, no rashes Neurological: no tremor with outstretched hands  ASSESSMENT: 1. DM2, insulin-dependent, uncontrolled, without long-term complications  2. HL  PLAN:  1. Patient with longstanding, uncontrolled, diabetes, on basal insulin only, with poor control.  HbA1c from 5 months ago was higher, at 7.8%.  However, a glucose obtained 1 month ago was higher than 400!  At today's visit, HbA1c is 10.5% (much higher). -At today's visit, patient is only checking sugars in the morning and they are greatly fluctuating, from the upper limit of the target range well into the 200s.  This large fluctuation along with the high HbA1c points towards insulin deficiency.  Therefore, at today's visit, we discussed about the fact that his Lantus regimen is likely not enough.  I am also reticent to recommend a GLP-1 receptor agonist for now, but we  may be to add this in the future.  For now, my recommendation was for mealtime insulin.  He has 2 meals a day: A brunch and dinner.  We discussed about varying the dose of rapid acting insulin before these 2 meals.  We will start at 6 units and I advised him that he may need to go up to 10 units (or even higher) depending on the response to the lower dose and the size of the meal.  Patient and his partner understand and wish to continue with this plan. -Patient has NovoLog at home.  I advised him to check the expiration date and use this if this is not expired, but I also gave them a written prescription for NovoLog in case it is expired.  I advised him to inject the insulin 15 minutes before a meal. -We also discussed about his meals.  He eats a lot of sweets.  We discussed about reducing these and especially trying to change his brunch to a lower fat, lower carb meal. -For now, we will continue the same dose of Lantus. -   I suggested to:  Patient Instructions  Please continue: - Lantus 30 units in am  Add: - NovoLog 6-10 units 15 min before the 2 meals of the day  Please return in 3 months with your sugar log.   - Strongly advised him to start checking sugars at different times of the day - check 3x a day, rotating checks - discussed about CBG targets for treatment: 80-130 mg/dL before meals and <180 mg/dL after meals; target HbA1c <7% (or up to 7.4% due to age and comorbidities). - given foot care handout  - given instructions for hypoglycemia management "15-15 rule"  - advised for yearly eye exams - he is due - he is also due for a foot exam -I did not have time to perform this at today's visit.  He would like to schedule another appointment with PCP and if his foot exam is not performed at that time, we can check it at next visit. - Return to clinic in 3 mo with sugar log    2. HL -Reviewed latest lipid panel from 2021: LDL above target, the rest the fractions at goal: Lab Results   Component Value Date   CHOL 182 04/16/2020   HDL 39.70 04/16/2020   LDLCALC 117 (H) 04/16/2020   TRIG 63 10/17/2020   CHOLHDL 5 04/16/2020  -He was previously on Lipitor 10 mg daily but stopped due to increased LFTs. -Latest lab these were reviewed and these were at the upper limit of normal, with the exception of the alkaline phosphatase, was elevated. -He is due for another lipid panel - if not done at the next appointment with PCP, we will check it at next visit  - Total time spent for the visit: 40 min, in precharting, reviewing Dr. Ellison's last note, obtaining medical information from the chart and from the pt. And his partner, reviewing his  previous labs, evaluations, and treatments, reviewing his symptoms, counseling him about his diabetes (please see the discussed topics above), and developing a plan to further treat it; he had a number of questions which I addressed.   , MD PhD [facility]  

## 2022-06-23 NOTE — Patient Instructions (Addendum)
Please continue: - Lantus 30 units in am  Add: - NovoLog 6-10 units 15 min before the 2 meals of the day  Please return in 3 months with your sugar log.   PATIENT INSTRUCTIONS FOR TYPE 2 DIABETES:  DIET AND EXERCISE Diet and exercise is an important part of diabetic treatment.  We recommended aerobic exercise in the form of brisk walking (working between 40-60% of maximal aerobic capacity, similar to brisk walking) for 150 minutes per week (such as 30 minutes five days per week) along with 3 times per week performing 'resistance' training (using various gauge rubber tubes with handles) 5-10 exercises involving the major muscle groups (upper body, lower body and core) performing 10-15 repetitions (or near fatigue) each exercise. Start at half the above goal but build slowly to reach the above goals. If limited by weight, joint pain, or disability, we recommend daily walking in a swimming pool with water up to waist to reduce pressure from joints while allow for adequate exercise.    BLOOD GLUCOSES Monitoring your blood glucoses is important for continued management of your diabetes. Please check your blood glucoses 2-4 times a day: fasting, before meals and at bedtime (you can rotate these measurements - e.g. one day check before the 3 meals, the next day check before 2 of the meals and before bedtime, etc.).   HYPOGLYCEMIA (low blood sugar) Hypoglycemia is usually a reaction to not eating, exercising, or taking too much insulin/ other diabetes drugs.  Symptoms include tremors, sweating, hunger, confusion, headache, etc. Treat IMMEDIATELY with 15 grams of Carbs: 4 glucose tablets  cup regular juice/soda 2 tablespoons raisins 4 teaspoons sugar 1 tablespoon honey Recheck blood glucose in 15 mins and repeat above if still symptomatic/blood glucose <100.  RECOMMENDATIONS TO REDUCE YOUR RISK OF DIABETIC COMPLICATIONS: * Take your prescribed MEDICATION(S) * Follow a DIABETIC diet: Complex  carbs, fiber rich foods, (monounsaturated and polyunsaturated) fats * AVOID saturated/trans fats, high fat foods, >2,300 mg salt per day. * EXERCISE at least 5 times a week for 30 minutes or preferably daily.  * DO NOT SMOKE OR DRINK more than 1 drink a day. * Check your FEET every day. Do not wear tightfitting shoes. Contact us if you develop an ulcer * See your EYE doctor once a year or more if needed * Get a FLU shot once a year * Get a PNEUMONIA vaccine once before and once after age 69 years  GOALS:  * Your Hemoglobin A1c of <7%  * fasting sugars need to be <130 * after meals sugars need to be <180 (2h after you start eating) * Your Systolic BP should be 809 or lower  * Your Diastolic BP should be 80 or lower  * Your HDL (Good Cholesterol) should be 40 or higher  * Your LDL (Bad Cholesterol) should be 100 or lower. * Your Triglycerides should be 150 or lower  * Your Urine microalbumin (kidney function) should be <30 * Your Body Mass Index should be 25 or lower    Please consider the following ways to cut down carbs and fat and increase fiber and micronutrients in your diet: - substitute whole grain for white bread or pasta - substitute brown rice for white rice - substitute 90-calorie flat bread pieces for slices of bread when possible - substitute sweet potatoes or yams for white potatoes - substitute humus for margarine - substitute tofu for cheese when possible - substitute almond or rice milk for regular milk (would not  drink soy milk daily due to concern for soy estrogen influence on breast cancer risk) - substitute dark chocolate for other sweets when possible - substitute water - can add lemon or orange slices for taste - for diet sodas (artificial sweeteners will trick your body that you can eat sweets without getting calories and will lead you to overeating and weight gain in the long run) - do not skip breakfast or other meals (this will slow down the metabolism and  will result in more weight gain over time)  - can try smoothies made from fruit and almond/rice milk in am instead of regular breakfast - can also try old-fashioned (not instant) oatmeal made with almond/rice milk in am - order the dressing on the side when eating salad at a restaurant (pour less than half of the dressing on the salad) - eat as little meat as possible - can try juicing, but should not forget that juicing will get rid of the fiber, so would alternate with eating raw veg./fruits or drinking smoothies - use as little oil as possible, even when using olive oil - can dress a salad with a mix of balsamic vinegar and lemon juice, for e.g. - use agave nectar, stevia sugar, or regular sugar rather than artificial sweateners - steam or broil/roast veggies  - snack on veggies/fruit/nuts (unsalted, preferably) when possible, rather than processed foods - reduce or eliminate aspartame in diet (it is in diet sodas, chewing gum, etc) Read the labels!  Try to read Dr. Janene Harvey book: "Program for Reversing Diabetes" for other ideas for healthy eating.

## 2022-07-03 IMAGING — DX DG CHEST 1V PORT
1 series · 1 of 1 positions shown · non-contrast
Comparison: 06/02/2019

CLINICAL DATA: [id number] diagnosed 10/07/2020, weakness, loss of
appetite

EXAM:
PORTABLE CHEST 1 VIEW

[chest]
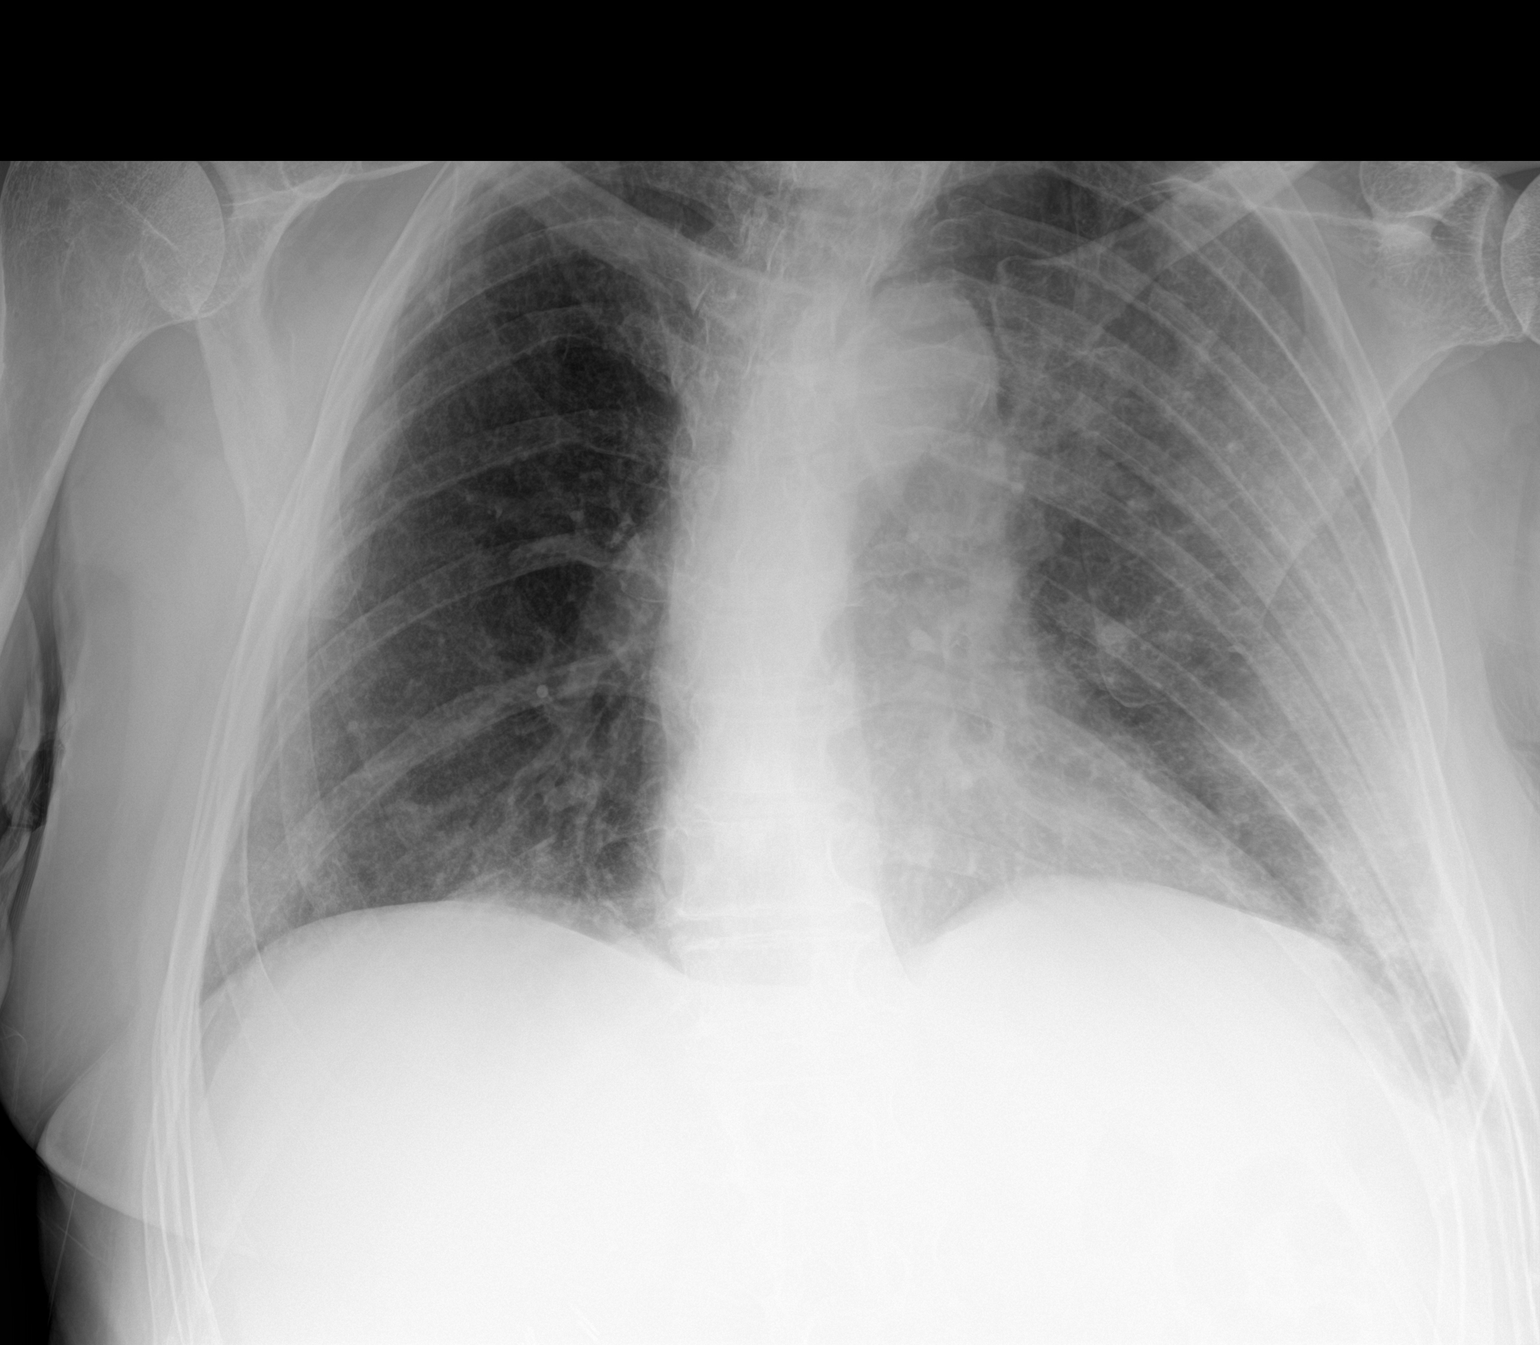

[1 of 1 positions shown; findings below may reference images not displayed]

FINDINGS: Single frontal view of the chest was obtained, with the patient
rotated toward the left. There is diffuse interstitial prominence,
chronic. There is patchy consolidation at the left lung base which
could reflect bronchopneumonia. No effusion or pneumothorax.
IMPRESSION: 1. Patchy left lower lobe bronchopneumonia.
2. Chronic interstitial scarring.

## 2022-07-04 IMAGING — CT CT ANGIO CHEST
2 of 7 series · 16 of 36 positions shown · IV contrast (omnipaque)
Comparison: CT 09/17/2009

CLINICAL DATA: Shortness of breath, hypoxia

EXAM:
CT ANGIOGRAPHY CHEST WITH CONTRAST
TECHNIQUE: Multidetector CT imaging of the chest was performed using the
standard protocol during bolus administration of intravenous
contrast. Multiplanar CT image reconstructions and MIPs were
obtained to evaluate the vascular anatomy.
CONTRAST:  75mL OMNIPAQUE IOHEXOL 350 MG/ML SOLN

[Series 6: pe thins · axial · 0.79mm/px · z∈[+931,+1222]mm · 15 of 477 slices shown]
[im 30/477  lung]
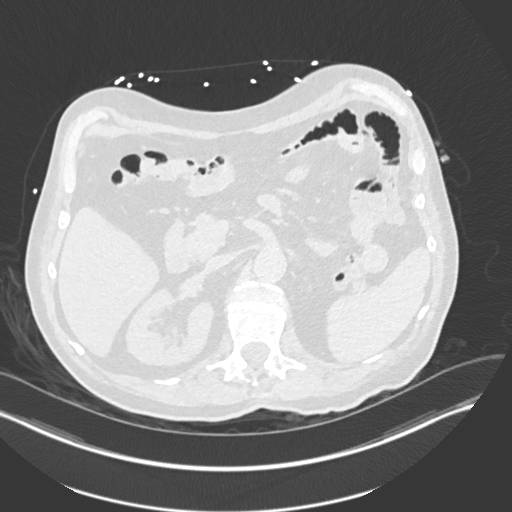
[im 60/477  mediastinal]
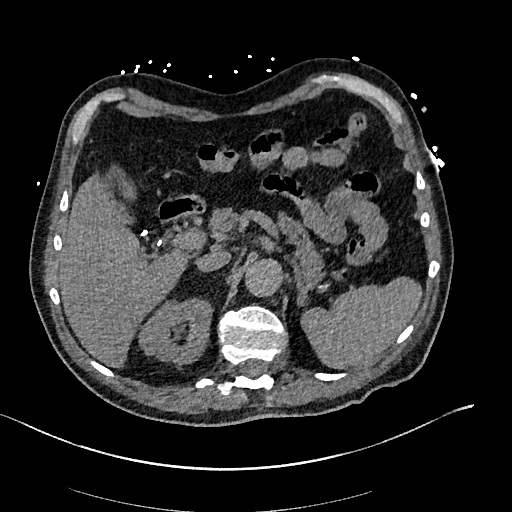
[im 90/477  lung]
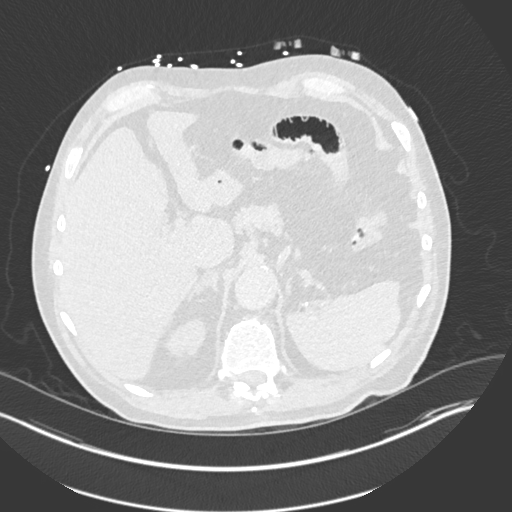
[im 120/477  mediastinal]
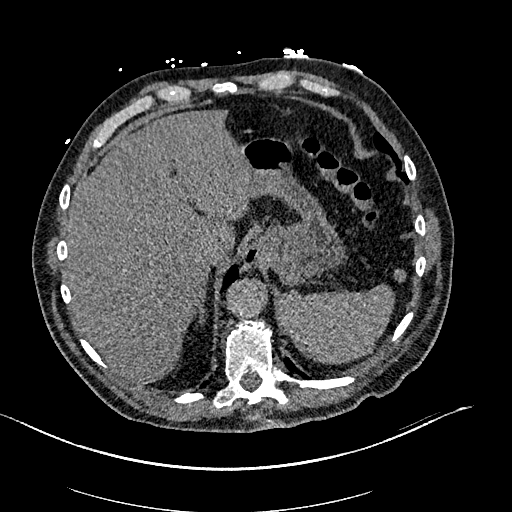
[im 149/477  lung]
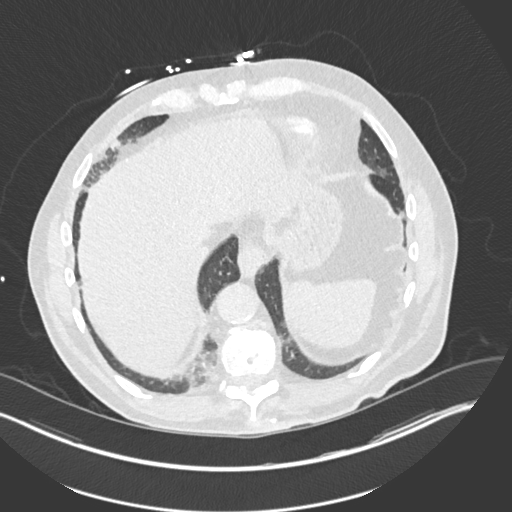
[im 179/477  mediastinal]
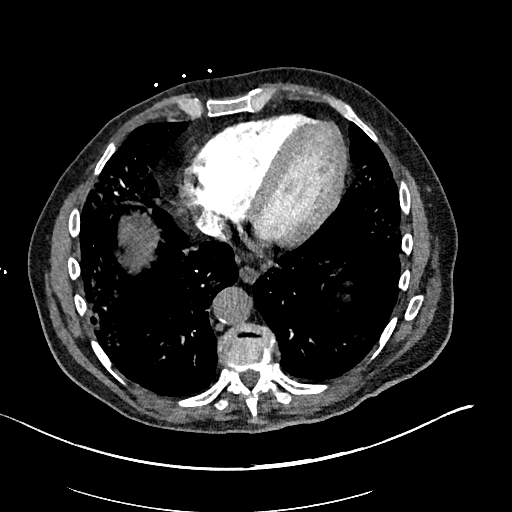
[im 209/477  lung]
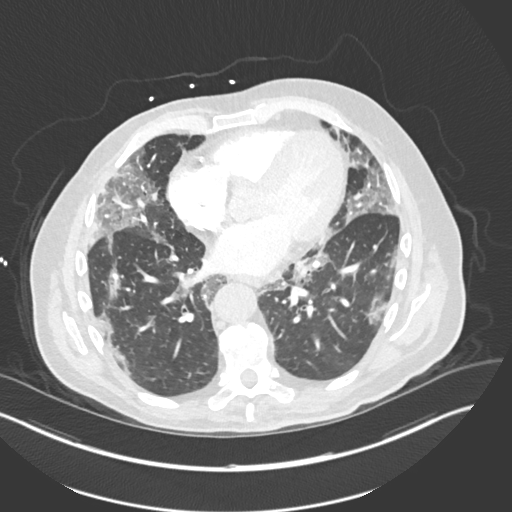
[im 239/477  mediastinal]
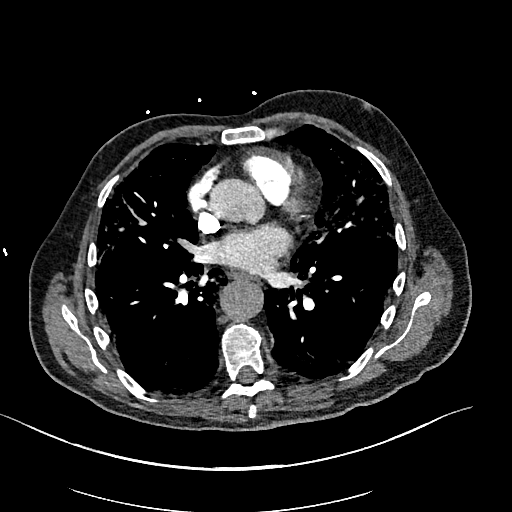
[im 268/477  lung]
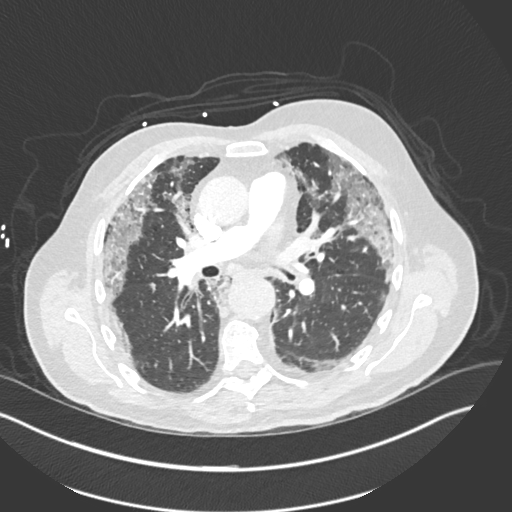
[im 298/477  mediastinal]
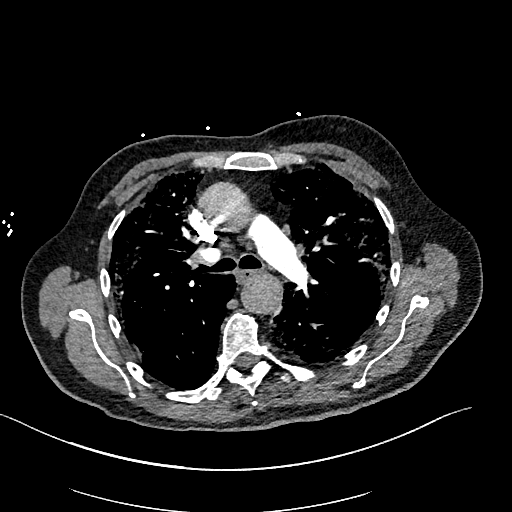
[im 328/477  lung]
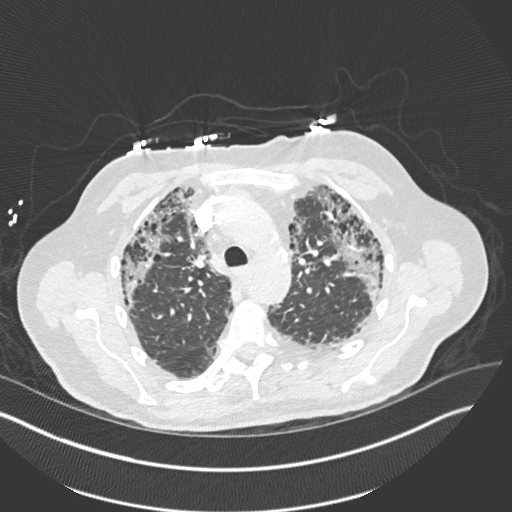
[im 358/477  mediastinal]
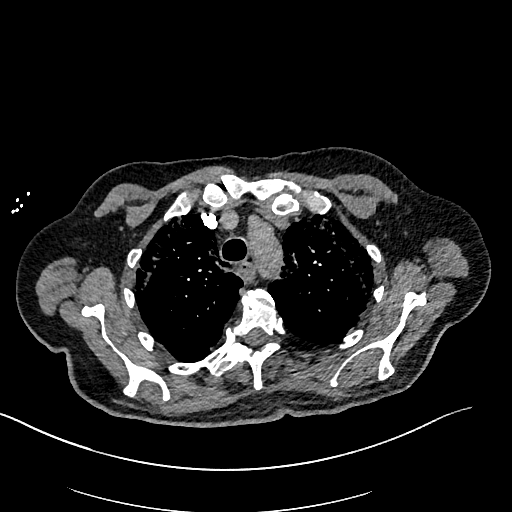
[im 387/477  lung]
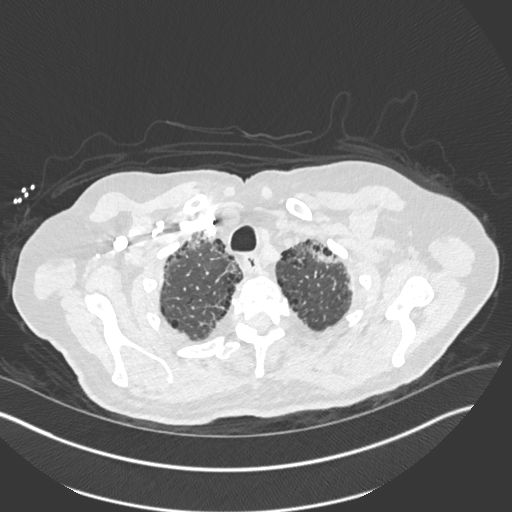
[im 417/477  mediastinal]
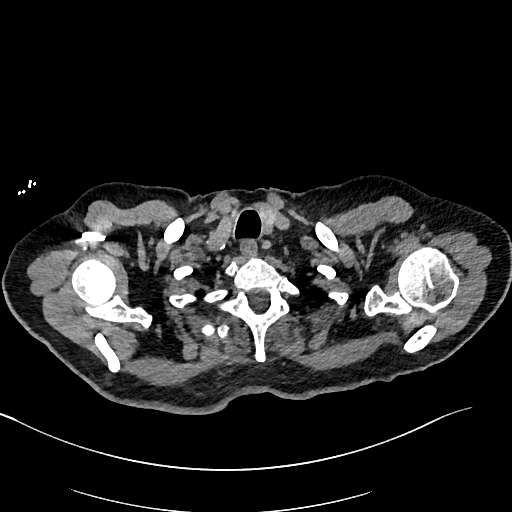
[im 447/477  lung]
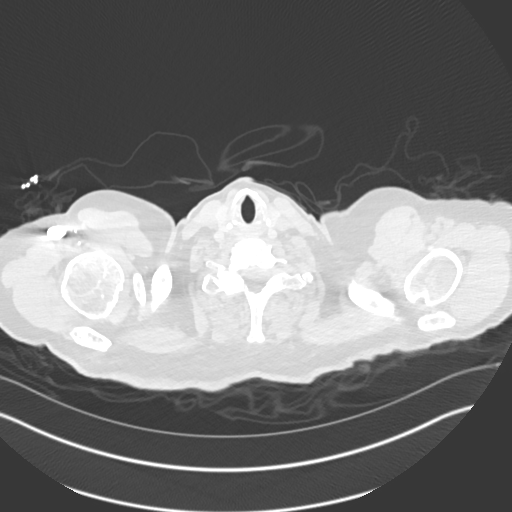

[Series 7: pe 2mm cor · coronal · 0.65mm/px · 1 of 151 slices shown]
[im 76/151  mediastinal]
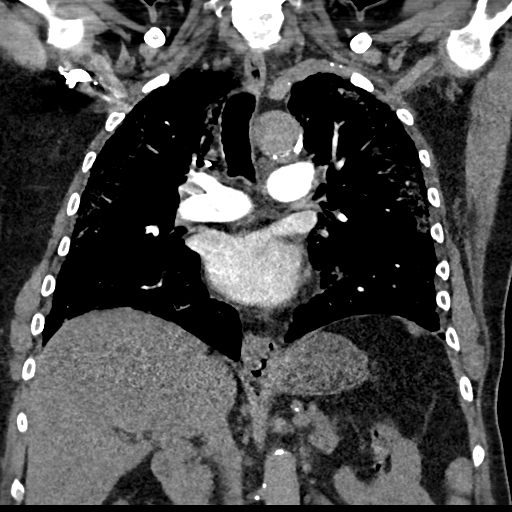

[16 of 36 positions shown; findings below may reference images not displayed]

FINDINGS: Cardiovascular: Satisfactory opacification the pulmonary arteries to
the segmental level. No pulmonary artery filling defects are
identified. Central pulmonary arteries are top-normal caliber.
Borderline cardiomegaly. Three-vessel coronary artery calcification.
No pericardial effusion. Suboptimal opacification of the thoracic
aorta for luminal assessment. Atherosclerotic plaque within the
normal caliber aorta. Normal 3 vessel branching of the aortic arch.
Proximal great vessels are mildly calcified but unremarkable. No
major venous abnormalities.

Mediastinum/Nodes: No mediastinal fluid or gas. Normal thyroid gland
and thoracic inlet. No acute abnormality of the trachea. Small
hiatal hernia. Fluid in the thoracic esophagus, could reflect some
mild reflux. No esophageal thickening or inflammatory change.
Scattered subcentimeter mediastinal and hilar nodes are favored to
be reactive. No worrisome enlarged mediastinal, hilar or axillary
adenopathy by size criteria.

Lungs/Pleura: Diffuse heterogeneous geographic areas of ground-glass
and consolidative opacity are present in both lungs distributed
largely in the lung periphery. There is a background of paraseptal
predominant emphysematous changes in both lungs. Diffuse airways
thickening could reflect a combination of acute and chronic airways
disease. No pneumothorax. No effusion. Stable 6 mm perifissural
nodule along the right major fissure(11/72), 8 mm right middle lobe
nodule (11/101), and 3 mm subpleural nodule in the posterior left
lower lobe (11/102).

Upper Abdomen: Left kidney appears to be surgically absent. Prior
cholecystectomy. Geographic focal fatty infiltration along the
falciform ligament. Small accessory splenule. Upper abdominal
atherosclerotic calcifications.

Musculoskeletal: Multilevel degenerative changes are present in the
imaged portions of the spine. Mild wedging and focal kyphosis at the
thoracolumbar junction, possibly degenerative or remote though could
correlate for point tenderness. Some dextrocurvature of the
midthoracic spine is noted as well. No other acute or suspicious
osseous abnormalities. Degenerative changes of the bilateral
shoulders. No worrisome chest wall masses or lesions.

Review of the MIP images confirms the above findings.
IMPRESSION: 1. No evidence of acute pulmonary artery filling defects to suggest
pulmonary embolism.
2. Diffuse heterogeneous geographic areas of ground-glass and
consolidative opacity in both lungs distributed largely in the lung
periphery, concerning for multifocal pneumonia including potential
atypical viral etiologies such as [id number].
3. Mild wedging and focal kyphosis at the thoracolumbar junction,
possibly degenerative or remote though could correlate for point
tenderness to exclude acuity.
4. Left kidney appears to be surgically absent.
5. Small hiatal hernia with fluid in the thoracic esophagus,
correlate for symptoms of reflux.
6. Few stable small pulmonary nodules, almost certainly benign.
7. Aortic Atherosclerosis ([id number].Y)
8. Emphysema ([id number].U).

## 2022-08-11 DIAGNOSIS — H3554 Dystrophies primarily involving the retinal pigment epithelium: Secondary | ICD-10-CM | POA: Diagnosis not present

## 2022-08-11 DIAGNOSIS — Z794 Long term (current) use of insulin: Secondary | ICD-10-CM | POA: Diagnosis not present

## 2022-08-11 DIAGNOSIS — H52203 Unspecified astigmatism, bilateral: Secondary | ICD-10-CM | POA: Diagnosis not present

## 2022-08-11 DIAGNOSIS — H16223 Keratoconjunctivitis sicca, not specified as Sjogren's, bilateral: Secondary | ICD-10-CM | POA: Diagnosis not present

## 2022-08-11 DIAGNOSIS — E119 Type 2 diabetes mellitus without complications: Secondary | ICD-10-CM | POA: Diagnosis not present

## 2022-08-11 DIAGNOSIS — H353122 Nonexudative age-related macular degeneration, left eye, intermediate dry stage: Secondary | ICD-10-CM | POA: Diagnosis not present

## 2022-08-11 DIAGNOSIS — H353111 Nonexudative age-related macular degeneration, right eye, early dry stage: Secondary | ICD-10-CM | POA: Diagnosis not present

## 2022-08-11 DIAGNOSIS — H524 Presbyopia: Secondary | ICD-10-CM | POA: Diagnosis not present

## 2022-08-11 DIAGNOSIS — H43813 Vitreous degeneration, bilateral: Secondary | ICD-10-CM | POA: Diagnosis not present

## 2022-08-31 ENCOUNTER — Telehealth: Payer: Self-pay | Admitting: Pharmacist

## 2022-08-31 NOTE — Chronic Care Management (AMB) (Unsigned)
Chronic Care Management Pharmacy Assistant   Name: Bill [name]  MRN: [id number] DOB: 07-04-1941  Reason for Encounter: Disease State/ General Assessment per MP  Recent office visits:  None  Recent consult visits:  06/23/22 Philemon Kingdom, MD (Endo) - Patient presented for Poorly controlled type 2 diabetes mellitus with circulatory disorder and other concerns. Prescribed Insulin Aspart.  Hospital visits:  None in previous 6 months  Medications: Outpatient Encounter Medications as of 08/31/2022  Medication Sig   Accu-Chek Softclix Lancets lancets 100 each by Other route 2 times daily at 12 noon and 4 pm. Use as instructed   acetaminophen (TYLENOL) 325 MG tablet Take 2 tablets (650 mg total) by mouth every 6 (six) hours as needed for mild pain (or Fever >/= 101).   Ascorbic Acid (VITAMIN C) 100 MG tablet Take 100 mg by mouth daily.   aspirin 81 MG tablet Take 81 mg by mouth daily.   bisoprolol (ZEBETA) 5 MG tablet TAKE 1/2 TABLET (2.5 MG) BY MOUTH EVERY OTHER DAY   Blood Glucose Monitoring Suppl (ACCU-CHEK GUIDE) w/Device KIT 1 kit by Other route 2 (two) times daily after a meal.   Calcium Citrate-Vitamin D (CALCIUM CITRATE +D PO) Take 1 tablet by mouth daily.    cyanocobalamin 1000 MCG tablet Take 1,000 mcg by mouth daily.    cyclobenzaprine (FLEXERIL) 10 MG tablet Take 1 tablet (10 mg total) by mouth 3 (three) times daily as needed for muscle spasms.   fexofenadine (ALLEGRA) 180 MG tablet Take 1 tablet (180 mg total) by mouth daily.   finasteride (PROSCAR) 5 MG tablet Take 5 mg by mouth daily.   Fluticasone-Umeclidin-Vilant (TRELEGY ELLIPTA) 100-62.5-25 MCG/INH AEPB Inhale 1 puff into the lungs daily.   glucose blood test strip 100 each by Other route 2 times daily at 12 noon and 4 pm. Use as instructed   insulin aspart (NOVOLOG FLEXPEN) 100 UNIT/ML FlexPen Inject 6-10 Units into the skin 2 (two) times daily with a meal.   insulin glargine (LANTUS SOLOSTAR) 100 UNIT/ML  Solostar Pen Inject 18 Units into the skin every morning. And pen needles 1/day (Patient taking differently: Inject 30 Units into the skin every morning. And pen needles 1/day)   Ipratropium-Albuterol (COMBIVENT) 20-100 MCG/ACT AERS respimat Inhale 1 puff into the lungs 4 (four) times daily as needed for wheezing.   nitroGLYCERIN (NITROSTAT) 0.4 MG SL tablet PLACE 1 TABLET (0.4 MG TOTAL) UNDER THE TONGUE EVERY 5 (FIVE) MINUTES AS NEEDED FOR CHEST PAIN.   RELION PEN NEEDLES 32G X 4 MM MISC USE DAILY AT 2PM   Vitamin E 400 units TABS Take 400 Units by mouth daily.   Zinc 100 MG TABS Take 100 mg by mouth daily.   [DISCONTINUED] atorvastatin (LIPITOR) 10 MG tablet Take 1 tablet (10 mg total) by mouth daily. (Patient not taking: Reported on 09/26/2020)   No facility-administered encounter medications on file as of 08/31/2022.   Contacted Bill [name] for General Review Call  Adherence Review:  Does the Clinical Pharmacist Assistant have access to adherence rates? Yes No star rated medications   Disease State Questions:  Able to connect with Patient? {yes/no:20286} Did patient have any problems with their health recently? {yes/no:20286} Note problems and Concerns: Have you had any admissions or emergency room visits or worsening of your condition(s) since last visit? {yes/no:20286} Details of ED visit, hospital visit and/or worsening condition(s): Have you had any visits with new specialists or providers since your last visit? {yes/no:20286} Explain: Have  you had any new health care problem(s) since your last visit? {yes/no:20286} New problem(s) reported: Have you run out of any of your medications since you last spoke with clinical pharmacist? {yes/no:20286} What caused you to run out of your medications? Are there any medications you are not taking as prescribed? {yes/no:20286} What kept you from taking your medications as prescribed? Are you having any issues or side effects with  your medications? {yes/no:20286} Note of issues or side effects: Do you have any other health concerns or questions you want to discuss with your Clinical Pharmacist before your next visit? {yes/no:20286} Note additional concerns and questions from Patient. Are there any health concerns that you feel we can do a better job addressing? {yes/no:20286} Note Patient's response. Are you having any problems with any of the following since the last visit: (select all that apply)  {General Call:27390}  Details: 12. Any falls since last visit? {yes/no:20286}  Details: 13. Any increased or uncontrolled pain since last visit? {yes/no:20286}  Details: 14. Next visit Type: {Telephone/Office:25179}       Visit with:        Date:        Time:  49. Additional Details? {yes/no:20286}    Maddie Need  Care Gaps: COVID Booster - Overdue Eye Exam - Overdue Diabetic Urine - Overdue Hepatitis C Screen - Overdue Zoster Vaccine - Overdue Lung cancer screen - Overdue Flu Vaccine - Overdue Foot Exam - Overdue PNA Vaccine - Postponed CCM- BP- 120/82 06/23/22 AWV- 4/23 Lab Results  Component Value Date   HGBA1C 10.5 (A) 06/23/2022    Star Rating Drugs: None   Ned Clines [place] Clinical Pharmacist Assistant [phone]

## 2022-10-01 ENCOUNTER — Encounter: Payer: Self-pay | Admitting: Internal Medicine

## 2022-10-01 ENCOUNTER — Ambulatory Visit: Payer: Medicare HMO | Admitting: Internal Medicine

## 2022-10-01 VITALS — BP 128/74 | HR 62 | Ht 67.0 in | Wt 170.2 lb

## 2022-10-01 DIAGNOSIS — E1159 Type 2 diabetes mellitus with other circulatory complications: Secondary | ICD-10-CM

## 2022-10-01 DIAGNOSIS — Z794 Long term (current) use of insulin: Secondary | ICD-10-CM | POA: Diagnosis not present

## 2022-10-01 DIAGNOSIS — E119 Type 2 diabetes mellitus without complications: Secondary | ICD-10-CM

## 2022-10-01 DIAGNOSIS — E782 Mixed hyperlipidemia: Secondary | ICD-10-CM | POA: Diagnosis not present

## 2022-10-01 DIAGNOSIS — E1165 Type 2 diabetes mellitus with hyperglycemia: Secondary | ICD-10-CM | POA: Diagnosis not present

## 2022-10-01 LAB — POCT GLYCOSYLATED HEMOGLOBIN (HGB A1C): Hemoglobin A1C: 6.7 % — AB (ref 4.0–5.6)

## 2022-10-01 MED ORDER — LANTUS SOLOSTAR 100 UNIT/ML ~~LOC~~ SOPN: 30.0000 [IU] | PEN_INJECTOR | SUBCUTANEOUS | 3 refills | Status: DC

## 2022-10-01 MED ORDER — RELION PEN NEEDLES 32G X 4 MM MISC
3 refills | Status: DC
Start: 2022-10-01 — End: 2023-07-20

## 2022-10-01 MED ORDER — GLUCOSE BLOOD VI STRP: 1.0000 | ORAL_STRIP | Freq: Two times a day (BID) | 3 refills | Status: DC

## 2022-10-01 NOTE — Patient Instructions (Addendum)
Please continue: - Lantus 30 units in am  Reduce icecream.  Please return in 4 months with your sugar log.

## 2022-10-01 NOTE — Progress Notes (Signed)
Patient ID: Bill [name], male   DOB: 1941-02-03, 81 y.o.   MRN: [id number]  HPI: Bill [name] is a 81 y.o.-year-old male, returning for follow-up for DM2, dx in 2019, insulin-dependent since 2022, uncontrolled, with complications (CAD, PAD). Pt. previously saw Dr. Loanne Drilling, but last visit with me 3 months ago. He is here with his lady partner (they have been together for >9 years).  She helps him with his medications since patient had memory loss.  Interim history: No increased urination, blurry vision, nausea, chest pain.  Reviewed HbA1c: Lab Results  Component Value Date   HGBA1C 10.5 (A) 06/23/2022   HGBA1C 7.8 (A) 12/11/2021   HGBA1C 6.9 (A) 08/27/2021   HGBA1C 7.2 (A) 06/24/2021   HGBA1C 7.8 (A) 06/11/2021   HGBA1C 16.8 Repeated and verified X2. (H) 04/11/2021   HGBA1C 7.9 (H) 10/18/2020   HGBA1C 7.4 (H) 04/16/2020   HGBA1C 15.0 (H) 03/28/2019   At last visit he was on: - Lantus 60 >> 18 >> 30 units at bedtime Per Dr. Cordelia Pen note, he was not started on multiple daily injections (NovoLog) due to memory loss. Metformin did not work for him in the past.   We changed to: - Lantus 30 units in am -  >> could not get 2/2 price  Pt checks his sugars 1x a day and they are: - am: 130-250 >> 136-172 (after coffee) - 2h after brunch: 167 - before dinner: n/c - 2h after dinner: n/c - bedtime: n/c - nighttime: n/c Lowest sugar was 120 >> 136; he has hypoglycemia awareness at 90.  Highest sugar was 400 - not recently >> 172.  Glucometer: Accu-Chek guide  Pt's meals are: - Brunch: cereal (corn flakes) + whole milk; oatmeal with milk and butter - Dinner: meat + veggies/salad + starch (potato, bisquit) - Snacks: "I have a huge sweet tooth" - chocolate, marshmallows, molasses, jelly, occasional regular sodas   - no CKD, last BUN/creatinine:  Lab Results  Component Value Date   BUN 11 05/22/2022   BUN 12 05/23/2021   CREATININE 0.75 05/22/2022   CREATININE 0.89  05/23/2021  He is not on ACE inhibitor/ARB.  -+ HL; last set of lipids: Lab Results  Component Value Date   CHOL 182 04/16/2020   HDL 39.70 04/16/2020   LDLCALC 117 (H) 04/16/2020   TRIG 63 10/17/2020   CHOLHDL 5 04/16/2020  Previously on Lipitor 10 mg daily, now off because of increased LFTs with statins in the past.  - last eye exam was on 08/11/2022: No DR. Records in Care everywhere.  - no numbness and tingling in his feet.  Last foot exam 06/24/2021.  He saw podiatry in the past.  He is on ASA 81.  He has a history of COPD, anemia, GERD, headache, neck pain, pulmonary nodule, myeloproliferative neoplasm. Also, he has LE varices - he had veins stripping in the past. He had an abnormal stress test.  ROS: + see HPI  Past Medical History:  Diagnosis Date   Abnormal nuclear stress test 06/02/2019   Acute respiratory failure due to COVID-19 [facility]) 10/17/2020   Anemia    Angina pectoris ([place]) 06/02/2019   Formatting of this note might be different from the original. 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 Abnormal EGD 8/17   Headache 1/61/0960   Helicobacter pylori gastritis 07/21/2016   Medicare annual wellness visit, initial 01/05/2017   Formatting of this note might be different from the original. 3/18   Mixed dyslipidemia 01/12/2020   Neck pain 02/16/2020   Other  chronic pain 09/02/2020   Penicillin allergy 07/21/2016   Pneumonia    Pneumonia due to COVID-19 virus 10/17/2020   Pulmonary nodule 02/28/2014   Formatting of this note might be different from the original. RML, no change   Right groin pain 06/16/2019   Skin cancer    Trigeminal neuralgia 07/26/2020   Type 2 diabetes mellitus without complication, without long-term current use of insulin ([place]) 04/11/2021   Past Surgical History:  Procedure Laterality Date   APPENDECTOMY     back fusion     cataract surgery Left    CHOLECYSTECTOMY     COLONOSCOPY  2014   clear    LEFT HEART CATH AND CORONARY ANGIOGRAPHY N/A 06/09/2019   Procedure: LEFT HEART CATH AND CORONARY ANGIOGRAPHY;  Surgeon: Jettie Booze, MD;  Location: [facility];  Service: Cardiovascular;  Laterality: N/A;   SPINE SURGERY  1984   thoracic spine fusion    Social History   Socioeconomic History   Marital status: Significant Other    Spouse name: Not on file   Number of children: 3   Years of education: 7   Highest education level: Not on file  Occupational History   Occupation: retired    Fish farm manager: [employer].  Tobacco Use   Smoking status: Former    Packs/day: 1.50    Years: 64.00    Total pack years: 96.00    Types: Cigarettes    Quit date: 2007    Years since quitting: 16.9   Smokeless tobacco: Never  Vaping Use   Vaping Use: Never used  Substance and Sexual Activity   Alcohol use: No   Drug use: No   Sexual activity: Not on file  Other Topics Concern   Not on file  Social History Narrative   Lives with significant other in a 2 story home.  Has 3 children.  Retired.  Education: 7th grade.    Social Determinants of Health   Financial Resource Strain: Low Risk  (02/20/2022)   Overall Financial Resource Strain (CARDIA)    Difficulty of Paying Living Expenses: Not hard at all  Food Insecurity: No Food Insecurity (02/20/2022)   Hunger Vital Sign    Worried About Running Out of Food in  the Last Year: Never true    Ran Out of Food in the Last Year: Never true  Transportation Needs: No Transportation Needs (02/20/2022)   PRAPARE - Hydrologist (Medical): No    Lack of Transportation (Non-Medical): No  Physical Activity: Sufficiently Active (02/20/2022)   Exercise Vital Sign    Days of Exercise per Week: 5 days    Minutes of Exercise per Session: 150+ min  Stress: No Stress Concern Present (02/20/2022)   [place]    Feeling of Stress : Not at  all  Social Connections: Socially Isolated (02/20/2022)   Social Connection and Isolation Panel [NHANES]    Frequency of Communication with Friends and Family: More than three times a week    Frequency of Social Gatherings with Friends and Family: More than three times a week    Attends Religious Services: Never    Marine scientist or Organizations: No    Attends Archivist Meetings: Never    Marital Status: Never married  Intimate Partner Violence: Not At Risk (02/20/2022)   Humiliation, Afraid, Rape, and Kick questionnaire    Fear of Current or Ex-Partner: No    Emotionally Abused: No    Physically Abused: No    Sexually Abused: No   Current Outpatient Medications on File Prior to Visit  Medication Sig Dispense Refill   Accu-Chek Softclix Lancets lancets 100 each by Other route 2 times daily at 12 noon and 4 pm. Use as instructed 100 each 12   acetaminophen (TYLENOL) 325 MG tablet Take 2 tablets (650 mg total) by mouth every 6 (six) hours as needed for mild pain (or Fever >/= 101).     Ascorbic Acid (VITAMIN C) 100 MG tablet Take 100 mg by mouth daily.     aspirin 81 MG tablet Take 81 mg by mouth daily.     bisoprolol (ZEBETA) 5 MG tablet TAKE 1/2 TABLET (2.5 MG) BY MOUTH EVERY OTHER DAY 45 tablet 3   Blood Glucose Monitoring Suppl (ACCU-CHEK GUIDE) w/Device KIT 1 kit by Other route 2 (two) times daily after a meal. 1  kit 0   Calcium Citrate-Vitamin D (CALCIUM CITRATE +D PO) Take 1 tablet by mouth daily.      cyanocobalamin 1000 MCG tablet Take 1,000 mcg by mouth daily.      cyclobenzaprine (FLEXERIL) 10 MG tablet Take 1 tablet (10 mg total) by mouth 3 (three) times daily as needed for muscle spasms. 60 tablet 0   fexofenadine (ALLEGRA) 180 MG tablet Take 1 tablet (180 mg total) by mouth daily. 90 tablet 2   finasteride (PROSCAR) 5 MG tablet Take 5 mg by mouth daily.     Fluticasone-Umeclidin-Vilant (TRELEGY ELLIPTA) 100-62.5-25 MCG/INH AEPB Inhale 1 puff into the lungs daily. 1 each 0   glucose blood test strip 100 each by Other route 2 times daily at 12 noon and 4 pm. Use as instructed 100 each 12   insulin aspart (NOVOLOG FLEXPEN) 100 UNIT/ML FlexPen Inject 6-10 Units into the skin 2 (two) times daily with a meal. 30 mL 3   insulin glargine (LANTUS SOLOSTAR) 100 UNIT/ML Solostar Pen Inject 18 Units into the skin every morning. And pen needles 1/day (Patient taking differently: Inject 30 Units into the skin every morning. And pen needles 1/day) 30 mL 3   Ipratropium-Albuterol (COMBIVENT) 20-100 MCG/ACT AERS respimat Inhale 1 puff into the lungs 4 (four) times daily as needed for wheezing. 4 g 1   nitroGLYCERIN (NITROSTAT) 0.4 MG SL tablet PLACE 1 TABLET (0.4 MG TOTAL) UNDER THE TONGUE EVERY 5 (FIVE) MINUTES AS NEEDED FOR CHEST PAIN. 25 tablet 1   RELION PEN NEEDLES 32G X 4 MM MISC USE DAILY AT 2PM 100 each 0   Vitamin E 400 units TABS Take 400 Units by mouth daily.     Zinc 100 MG TABS Take 100 mg by mouth daily.     [DISCONTINUED] atorvastatin (LIPITOR) 10 MG tablet Take 1 tablet (10 mg total) by mouth daily. (Patient not taking: Reported on 09/26/2020) 90 tablet  2   No current facility-administered medications on file prior to visit.   Allergies  Allergen Reactions   Aspirin     Takes ASA 69m at home.   Oxycodone Nausea Only    PERCOCET- weakness, dizziness, shaking, nausea   Penicillins Hives    Did  it involve swelling of the face/tongue/throat, SOB, or low BP? No Did it involve sudden or severe rash/hives, skin peeling, or any reaction on the inside of your mouth or nose? Yes Did you need to seek medical attention at a hospital or doctor's office? Yes When did it last happen?      long time If all above answers are "NO", may proceed with cephalosporin use.    Percocet [Oxycodone-Acetaminophen]    Ciprofloxacin Rash   Prednisone Palpitations   Family History  Problem Relation Age of Onset   Colon cancer Mother    Diabetes Father    Heart disease Father    Stroke Father    Hypertension Father    Hyperlipidemia Father    Heart disease Brother    Pancreatic cancer Cousin        x 2   Prostate cancer Cousin    Heart disease Son    PE: BP 128/74 (BP Location: Right Arm, Patient Position: Sitting, Cuff Size: Normal)   Pulse 62   Ht _0  (1.702 m)   Wt 170 lb 3.2 oz (77.2 kg)   SpO2 95%   BMI 26.66 kg/m  Wt Readings from Last 3 Encounters:  10/01/22 170 lb 3.2 oz (77.2 kg)  06/23/22 165 lb 6.4 oz (75 kg)  05/22/22 166 lb 4.8 oz (75.4 kg)   Constitutional: Normal  weight, in NAD Eyes:  EOMI, no exophthalmos ENT: no neck masses, no cervical lymphadenopathy Cardiovascular: RRR, No MRG Respiratory: CTA B Musculoskeletal: no deformities Skin:no rashes Neurological: no tremor with outstretched hands  ASSESSMENT: 1. DM2, insulin-dependent, uncontrolled, without long-term complications  2. HL  PLAN:  1. Patient with longstanding, uncontrolled, diabetes, on basal insulin only at last visit, with a much higher HbA1c, at 10.5% and blood sugars in the 400s.  At that time, we discussed that Lantus is not enough and I advised him to add mealtime insulin.  He was telling me that he had NovoLog at home and understand how he needed to inject.  We also discussed about reducing the amount of sweets that he was eating and to change his branch to a lower fat, lower carb meal.  We did  not change the Lantus dose. -At today's visit, he tells me that he did not start NovoLog as he could not afford it.  He did not let me know about this.  He did reduce sweets, but still has occasional ice cream at night.  We discussed about reducing this further.  However, reviewing his meter downloads, sugars appear to be at or slightly above target in the first half of the day he is not checking later in the day.  I am very surprised of his HbA1c today, which is ~4% is lower than before!  Due to this, we decided to continue his Lantus at the same dose, but not to intensify his regimen. - I suggested to:  Patient Instructions  Please continue: - Lantus 30 units in am  Reduce icecream.  Please return in 4 months with your sugar log.   - HbA1c target for him is less than 7.4% (due to age and comorbidities). - we checked his HbA1c: 6.7% (unexpectedly  low) - advised to check sugars at different times of the day - 4x a day, rotating check times - advised for yearly eye exams >> he is UTD - return to clinic in 3-4 months   2. HL -Reviewed latest lipid panel from 2021: LDL above target, the rest the fractions at goal: Lab Results  Component Value Date   CHOL 182 04/16/2020   HDL 39.70 04/16/2020   LDLCALC 117 (H) 04/16/2020   TRIG 63 10/17/2020   CHOLHDL 5 04/16/2020  -He previously on Lipitor 10 mg daily but stopped due to increased LFTs. -Latest LFTs were reviewed and these were at the upper limit of normal, with exception of the alkaline phosphatase, which was elevated: Lab Results  Component Value Date   ALT 40 05/22/2022   AST 34 05/22/2022   ALKPHOS 132 (H) 05/22/2022   BILITOT 0.6 05/22/2022  -Will recheck his lipid panel today  Component     Latest Ref Rng 10/01/2022  Cholesterol     0 - 200 mg/dL 176   Triglycerides     0.0 - 149.0 mg/dL 119.0   HDL Cholesterol     >39.00 mg/dL 43.50   VLDL     0.0 - 40.0 mg/dL 23.8   LDL (calc)     0 - 99 mg/dL 108 (H)   Total  CHOL/HDL Ratio 4   NonHDL 132.09   LDL still above target, but improved; the rest of the fractions are at goal. Reducing icecream will help.  Philemon Kingdom, MD PhD [facility] Endocrinology

## 2022-10-02 LAB — LIPID PANEL
Cholesterol: 176 mg/dL (ref 0–200)
HDL: 43.5 mg/dL (ref >39.00)
LDL Cholesterol: 108 mg/dL — ABNORMAL HIGH (ref 0–99)
NonHDL: 132.09
Total CHOL/HDL Ratio: 4
Triglycerides: 119 mg/dL (ref 0.0–149.0)
VLDL: 23.8 mg/dL (ref 0.0–40.0)

## 2022-10-09 ENCOUNTER — Other Ambulatory Visit: Payer: Self-pay | Admitting: Family Medicine

## 2022-11-18 ENCOUNTER — Telehealth: Payer: Self-pay | Admitting: *Deleted

## 2022-11-18 NOTE — Patient Outreach (Signed)
  Care Coordination   11/18/2022 Name: Bill [name] MRN: [id number] DOB: 1941/07/14   Care Coordination Outreach Attempts:  An unsuccessful telephone outreach was attempted today to offer the patient information about available care coordination services as a benefit of their health plan.   Follow Up Plan:  Additional outreach attempts will be made to offer the patient care coordination information and services.   Encounter Outcome:  No Answer   Care Coordination Interventions:  No, not indicated    Raina Mina, RN Care Management Coordinator [place] Office [phone]

## 2022-11-28 DIAGNOSIS — J069 Acute upper respiratory infection, unspecified: Secondary | ICD-10-CM | POA: Diagnosis not present

## 2022-11-28 DIAGNOSIS — R0981 Nasal congestion: Secondary | ICD-10-CM | POA: Diagnosis not present

## 2022-11-28 DIAGNOSIS — J029 Acute pharyngitis, unspecified: Secondary | ICD-10-CM | POA: Diagnosis not present

## 2023-02-01 ENCOUNTER — Ambulatory Visit: Payer: Medicare HMO | Admitting: Internal Medicine

## 2023-02-01 ENCOUNTER — Encounter: Payer: Self-pay | Admitting: Internal Medicine

## 2023-02-01 VITALS — BP 128/84 | HR 85 | Ht 67.0 in | Wt 174.4 lb

## 2023-02-01 DIAGNOSIS — E1165 Type 2 diabetes mellitus with hyperglycemia: Secondary | ICD-10-CM | POA: Diagnosis not present

## 2023-02-01 DIAGNOSIS — E1159 Type 2 diabetes mellitus with other circulatory complications: Secondary | ICD-10-CM

## 2023-02-01 DIAGNOSIS — E782 Mixed hyperlipidemia: Secondary | ICD-10-CM

## 2023-02-01 LAB — POCT GLYCOSYLATED HEMOGLOBIN (HGB A1C): Hemoglobin A1C: 7.1 % — AB (ref 4.0–5.6)

## 2023-02-01 MED ORDER — EMPAGLIFLOZIN 10 MG PO TABS: 10.0000 mg | ORAL_TABLET | Freq: Every day | ORAL | 3 refills | Status: DC

## 2023-02-01 NOTE — Patient Instructions (Addendum)
Please continue: - Lantus 30 units in am  Please start: - Jardiance 10 mg before breakfast  Please return in 4 months with your sugar log.

## 2023-02-01 NOTE — Progress Notes (Signed)
Patient ID: Bill [name], male   DOB: October 18, 1941, 82 y.o.   MRN: [id number]  HPI: Bill [name] is a 82 y.o.-year-old male, returning for follow-up for DM2, dx in 2019, insulin-dependent since 2022, uncontrolled, with complications (CAD, PAD). Pt. previously saw Dr. Everardo All, but last visit with me 4 months ago. He is here with his lady partner (they have been together for >9 years).  She helps him with his medications since patient had memory loss.  Interim history: No increased urination, blurry vision, nausea, chest pain. He still eats sweets but tries to have ice cream less frequently.  He just bought an ice cream machine.  Reviewed HbA1c: Lab Results  Component Value Date   HGBA1C 6.7 (A) 10/01/2022   HGBA1C 10.5 (A) 06/23/2022   HGBA1C 7.8 (A) 12/11/2021   HGBA1C 6.9 (A) 08/27/2021   HGBA1C 7.2 (A) 06/24/2021   HGBA1C 7.8 (A) 06/11/2021   HGBA1C 16.8 Repeated and verified X2. (H) 04/11/2021   HGBA1C 7.9 (H) 10/18/2020   HGBA1C 7.4 (H) 04/16/2020   HGBA1C 15.0 (H) 03/28/2019   Previously on: - Lantus 60 >> 18 >> 30 units at bedtime Per Dr. George Hugh note, he was not started on multiple daily injections (NovoLog) due to memory loss. Metformin did not work for him in the past.   We changed to: - Lantus 30 units in am -  >> could not get 2/2 price  Pt checks his sugars 1x a day and they are: - am: 130-250 >> 136-172 (after coffee) >> 125-205 - 2h after brunch: 167 >> n/c - before dinner: n/c - 2h after dinner: n/c - bedtime: n/c - nighttime: n/c >> 229, 277 Lowest sugar was 120 >> 136 >> 125; he has hypoglycemia awareness at 90.  Highest sugar was 400 - not recently >> 172 >> 277.  Glucometer: Accu-Chek guide  Pt's meals are: - Brunch: cereal (corn flakes) + whole milk; oatmeal with milk and butter - Dinner: meat + veggies/salad + starch (potato, bisquit) - Snacks: "I have a huge sweet tooth" - chocolate, marshmallows, molasses, jelly, occasional regular  sodas   - no CKD, last BUN/creatinine:  Lab Results  Component Value Date   BUN 11 05/22/2022   BUN 12 05/23/2021   CREATININE 0.75 05/22/2022   CREATININE 0.89 05/23/2021  He is not on ACE inhibitor/ARB.  -+ HL; last set of lipids: Lab Results  Component Value Date   CHOL 176 10/01/2022   HDL 43.50 10/01/2022   LDLCALC 108 (H) 10/01/2022   TRIG 119.0 10/01/2022   CHOLHDL 4 10/01/2022  Previously on Lipitor 10 mg daily >> off because of increased LFTs with statins in the past.  - last eye exam was on 08/11/2022: No DR. Records in Care everywhere.  - no numbness and tingling in his feet.  Last foot exam 06/24/2021.  He saw podiatry in the past.  He is on ASA 81.  He has a history of COPD, anemia, GERD, headache, neck pain, pulmonary nodule, myeloproliferative neoplasm. Also, he has LE varices - he had veins stripping in the past. He had an abnormal stress test.  ROS: + see HPI  Past Medical History:  Diagnosis Date   Abnormal nuclear stress test 06/02/2019   Acute respiratory failure due to COVID-19 [facility]) 10/17/2020   Anemia    Angina pectoris (HCC) 06/02/2019   Formatting of this note might be different from the original. 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 Abnormal EGD 8/17   Headache 01/06/2021   Helicobacter pylori gastritis 07/21/2016   Medicare annual  wellness visit, initial 01/05/2017   Formatting of this note might be different from the original. 3/18   Mixed dyslipidemia 01/12/2020   Neck pain 02/16/2020   Other chronic pain 09/02/2020   Penicillin allergy 07/21/2016   Pneumonia    Pneumonia due to COVID-19 virus 10/17/2020   Pulmonary nodule 02/28/2014   Formatting of this note might be different from the original. RML, no change   Right groin pain 06/16/2019   Skin cancer    Trigeminal neuralgia 07/26/2020   Type 2 diabetes mellitus without complication, without long-term current use of insulin (HCC) 04/11/2021   Past Surgical History:  Procedure Laterality Date   APPENDECTOMY     back fusion     cataract surgery Left    CHOLECYSTECTOMY     COLONOSCOPY  2014   clear    LEFT HEART CATH AND CORONARY ANGIOGRAPHY N/A 06/09/2019   Procedure: LEFT HEART CATH AND CORONARY ANGIOGRAPHY;  Surgeon: Corky CraftsVaranasi, Jayadeep S, MD;  Location: [facility];  Service: Cardiovascular;  Laterality: N/A;   SPINE SURGERY  1984   thoracic spine fusion    Social History   Socioeconomic History   Marital status: Significant Other    Spouse name: Not on file   Number of children: 3   Years of education: 7   Highest education level: Not on file  Occupational History   Occupation: retired    Associate Professormployer: [employer]  Tobacco Use   Smoking status: Former    Packs/day: 1.50    Years: 64.00    Additional pack years: 0.00    Total pack years: 96.00    Types: Cigarettes    Quit date: 2007    Years since quitting: 17.2   Smokeless tobacco: Never  Vaping Use   Vaping Use: Never used  Substance and Sexual Activity   Alcohol use: No   Drug use: No   Sexual activity: Not on file  Other Topics Concern   Not on file  Social History Narrative   Lives with significant other in a 2 story home.  Has 3 children.  Retired.  Education: 7th grade.    Social Determinants of Health   Financial Resource Strain: Low Risk  (02/20/2022)   Overall  Financial Resource Strain (CARDIA)    Difficulty of Paying Living Expenses: Not hard at all  Food Insecurity: No Food Insecurity (02/20/2022)   Hunger Vital Sign    Worried About Running Out of Food in the Last Year: Never true    Ran Out of Food in the Last Year: Never true  Transportation Needs: No Transportation Needs (02/20/2022)   PRAPARE - Administrator, Civil ServiceTransportation    Lack of Transportation (Medical): No    Lack of Transportation (Non-Medical): No  Physical Activity: Sufficiently Active (02/20/2022)   Exercise Vital Sign    Days of Exercise per  Week: 5 days    Minutes of Exercise per Session: 150+ min  Stress: No Stress Concern Present (02/20/2022)   [employer] of Occupational Health - Occupational Stress Questionnaire    Feeling of Stress : Not at all  Social Connections: Socially Isolated (02/20/2022)   Social Connection and Isolation Panel [NHANES]    Frequency of Communication with Friends and Family: More than three times a week    Frequency of Social Gatherings with Friends and Family: More than three times a week    Attends Religious Services: Never    Database administrator or Organizations: No    Attends Banker Meetings: Never    Marital Status: Never married  Intimate Partner Violence: Not At Risk (02/20/2022)   Humiliation, Afraid, Rape, and Kick questionnaire    Fear of Current or Ex-Partner: No    Emotionally Abused: No    Physically Abused: No    Sexually Abused: No   Current Outpatient Medications on File Prior to Visit  Medication Sig Dispense Refill   Accu-Chek Softclix Lancets lancets 100 each by Other route 2 times daily at 12 noon and 4 pm. Use as instructed 100 each 12   acetaminophen (TYLENOL) 325 MG tablet Take 2 tablets (650 mg total) by mouth every 6 (six) hours as needed for mild pain (or Fever >/= 101).     Ascorbic Acid (VITAMIN C) 100 MG tablet Take 100 mg by mouth daily.     aspirin 81 MG tablet Take 81 mg by mouth daily.     bisoprolol  (ZEBETA) 5 MG tablet TAKE 1/2 TABLET (2.5 MG) BY MOUTH EVERY MONDAY, WEDNESDAY AND FRIDAY. 45 tablet 0   Blood Glucose Monitoring Suppl (ACCU-CHEK GUIDE) w/Device KIT 1 kit by Other route 2 (two) times daily after a meal. 1 kit 0   Calcium Citrate-Vitamin D (CALCIUM CITRATE +D PO) Take 1 tablet by mouth daily.      cyanocobalamin 1000 MCG tablet Take 1,000 mcg by mouth daily.      cyclobenzaprine (FLEXERIL) 10 MG tablet Take 1 tablet (10 mg total) by mouth 3 (three) times daily as needed for muscle spasms. 60 tablet 0   fexofenadine (ALLEGRA) 180 MG tablet Take 1 tablet (180 mg total) by mouth daily. 90 tablet 2   finasteride (PROSCAR) 5 MG tablet Take 5 mg by mouth daily.     Fluticasone-Umeclidin-Vilant (TRELEGY ELLIPTA) 100-62.5-25 MCG/INH AEPB Inhale 1 puff into the lungs daily. 1 each 0   glucose blood test strip 1 each by Other route 2 times daily at 12 noon and 4 pm. Use as instructed 2x a day - AccuChek 200 each 3   insulin glargine (LANTUS SOLOSTAR) 100 UNIT/ML Solostar Pen Inject 30 Units into the skin every morning. And pen needles 1/day 30 mL 3   Insulin Pen Needle (RELION PEN NEEDLES) 32G X 4 MM MISC USE DAILY 100 each 3   Ipratropium-Albuterol (COMBIVENT) 20-100 MCG/ACT AERS respimat Inhale 1 puff into the lungs 4 (four) times daily as needed for wheezing. 4 g 1   nitroGLYCERIN (NITROSTAT) 0.4 MG SL tablet PLACE 1 TABLET (0.4 MG TOTAL) UNDER THE TONGUE EVERY 5 (FIVE) MINUTES AS NEEDED FOR CHEST PAIN. 25 tablet 1   Vitamin E 400 units TABS Take 400 Units by mouth daily.     Zinc 100 MG TABS Take 100 mg by mouth daily.     [DISCONTINUED] atorvastatin (LIPITOR) 10 MG tablet Take 1 tablet (10 mg total) by mouth daily. (  Patient not taking: Reported on 09/26/2020) 90 tablet 2   No current facility-administered medications on file prior to visit.   Allergies  Allergen Reactions   Aspirin     Takes ASA 81mg  at home.   Oxycodone Nausea Only    PERCOCET- weakness, dizziness, shaking,  nausea   Penicillins Hives    Did it involve swelling of the face/tongue/throat, SOB, or low BP? No Did it involve sudden or severe rash/hives, skin peeling, or any reaction on the inside of your mouth or nose? Yes Did you need to seek medical attention at a hospital or doctor's office? Yes When did it last happen?      long time If all above answers are "NO", may proceed with cephalosporin use.    Percocet [Oxycodone-Acetaminophen]    Ciprofloxacin Rash   Prednisone Palpitations   Family History  Problem Relation Age of Onset   Colon cancer Mother    Diabetes Father    Heart disease Father    Stroke Father    Hypertension Father    Hyperlipidemia Father    Heart disease Brother    Pancreatic cancer Cousin        x 2   Prostate cancer Cousin    Heart disease Son    PE: BP 128/84 (BP Location: Right Arm, Patient Position: Sitting, Cuff Size: Normal)   Pulse 85   Ht 5\' 7"  (1.702 m)   Wt 174 lb 6.4 oz (79.1 kg)   SpO2 95%   BMI 27.31 kg/m  Wt Readings from Last 3 Encounters:  02/01/23 174 lb 6.4 oz (79.1 kg)  10/01/22 170 lb 3.2 oz (77.2 kg)  06/23/22 165 lb 6.4 oz (75 kg)   Constitutional: Normal  weight, in NAD Eyes:  EOMI, no exophthalmos ENT: no neck masses, no cervical lymphadenopathy Cardiovascular: RRR, No MRG Respiratory: CTA B Musculoskeletal: no deformities Skin:no rashes Neurological: no tremor with outstretched hands  ASSESSMENT: 1. DM2, insulin-dependent, uncontrolled, without long-term complications  2. HL  PLAN:  1. Patient with longstanding, uncontrolled, diabetes, on basal insulin only, with worsening control last year, when HbA1c returned 10.5% in 05/2022.  Sugars were in the 400s at that time.  I advised him to start mealtime insulin and we also discussed about reducing the amount of sweets he was eating but did not change the Lantus dose.  He ended up not starting the NovoLog as he could not afford it but at last visit, after reducing sweets,  sugars were much better, at or slightly above target in the first half of the day.  He was not checking later in the day and I advised him to do so.  His HbA1c was approximately 4% lower than before so I did not recommend any changes in his regimen at that time.  I did advise him to reduce icecream intake.  He was able to reduce ice cream somewhat since last visit, but still has sweets. -At today's visit, sugars are mostly above goal in the morning and they increase significantly towards the end of the day.  We discussed that we need to escalate his regimen and I suggested addition of an SGLT2 inhibitor.  We discussed about this class of medicine: Mechanism of action, benefits, possible side effects, and he agrees to start the low-dose Jardiance.  I am hoping that this is covered by his insurance.  Marcelline Deist does not appear to be covered, but we can try this if [place] Pepper is not covered.  I advised him to  stay very well-hydrated while on this medication.  Will continue Lantus at the same dose. - I suggested to:  Patient Instructions  Please continue: - Lantus 30 units in am  Please start: - Jardiance 10 mg before breakfast  Please return in 4 months with your sugar log.   - HbA1c target for him is less than 7.4% (due to age and comorbidities). - we checked his HbA1c: 7.1% (slightly higher) - advised to check sugars at different times of the day - 1x a day, rotating check times - advised for yearly eye exams >> he is UTD - return to clinic in 4 months   2. HL -Reviewing his LDL from 2023, this was elevated.  The rest of the lipid fractions were at goal: Lab Results  Component Value Date   CHOL 176 10/01/2022   HDL 43.50 10/01/2022   LDLCALC 108 (H) 10/01/2022   TRIG 119.0 10/01/2022   CHOLHDL 4 10/01/2022  -He previously on Lipitor 10 mg daily but stopped due to increased LFTs.  He did not restart it yet. -Latest AST and ALT were not elevated: Lab Results  Component Value Date   ALT 40  05/22/2022   AST 34 05/22/2022   ALKPHOS 132 (H) 05/22/2022   BILITOT 0.6 05/22/2022   Carlus Pavlov, MD PhD [facility] Endocrinology

## 2023-02-04 DIAGNOSIS — D225 Melanocytic nevi of trunk: Secondary | ICD-10-CM | POA: Diagnosis not present

## 2023-02-04 DIAGNOSIS — D485 Neoplasm of uncertain behavior of skin: Secondary | ICD-10-CM | POA: Diagnosis not present

## 2023-02-04 DIAGNOSIS — D224 Melanocytic nevi of scalp and neck: Secondary | ICD-10-CM | POA: Diagnosis not present

## 2023-02-04 DIAGNOSIS — D2271 Melanocytic nevi of right lower limb, including hip: Secondary | ICD-10-CM | POA: Diagnosis not present

## 2023-02-04 DIAGNOSIS — D2261 Melanocytic nevi of right upper limb, including shoulder: Secondary | ICD-10-CM | POA: Diagnosis not present

## 2023-02-04 DIAGNOSIS — D2272 Melanocytic nevi of left lower limb, including hip: Secondary | ICD-10-CM | POA: Diagnosis not present

## 2023-02-04 DIAGNOSIS — L820 Inflamed seborrheic keratosis: Secondary | ICD-10-CM | POA: Diagnosis not present

## 2023-02-04 DIAGNOSIS — L570 Actinic keratosis: Secondary | ICD-10-CM | POA: Diagnosis not present

## 2023-02-04 DIAGNOSIS — L821 Other seborrheic keratosis: Secondary | ICD-10-CM | POA: Diagnosis not present

## 2023-02-04 DIAGNOSIS — D2262 Melanocytic nevi of left upper limb, including shoulder: Secondary | ICD-10-CM | POA: Diagnosis not present

## 2023-02-10 ENCOUNTER — Telehealth: Payer: Self-pay

## 2023-02-10 NOTE — Telephone Encounter (Signed)
Pt unable to afford Jardiance medication.

## 2023-02-11 ENCOUNTER — Other Ambulatory Visit: Payer: Self-pay | Admitting: Internal Medicine

## 2023-02-11 NOTE — Telephone Encounter (Signed)
Could we try this from PAP?

## 2023-02-11 NOTE — Telephone Encounter (Signed)
Mychart message sent to pt for follow up.

## 2023-02-15 ENCOUNTER — Telehealth: Payer: Self-pay | Admitting: Family Medicine

## 2023-02-15 NOTE — Telephone Encounter (Signed)
Called patient to schedule Medicare Annual Wellness Visit (AWV). Left message for patient to call back and schedule Medicare Annual Wellness Visit (AWV).  Last date of AWV: 02/20/22  Due to schedule change 02/23/23 appt needs to be r/s with either hanah kim or N[facility] ! beverly  If any questions, please contact me at [phone].  Thank you ,  Rudell Cobb AWV direct phone # [phone]

## 2023-02-18 ENCOUNTER — Telehealth: Payer: Self-pay | Admitting: Family Medicine

## 2023-02-18 NOTE — Telephone Encounter (Signed)
Contacted Bill [name] to schedule their annual wellness visit. Appointment made for 02/23/23.  Bill [name] AWV direct phone # [phone]   Lm and also sent my chart message with appt time change and that appt also was changed to phone appt with Bill [name] kim

## 2023-02-23 ENCOUNTER — Encounter: Payer: Medicare HMO | Admitting: Family Medicine

## 2023-03-03 ENCOUNTER — Ambulatory Visit (INDEPENDENT_AMBULATORY_CARE_PROVIDER_SITE_OTHER): Payer: Medicare HMO

## 2023-03-03 VITALS — BP 110/62 | HR 60 | Temp 98.4°F | Ht 67.0 in | Wt 173.1 lb

## 2023-03-03 DIAGNOSIS — Z0000 Encounter for general adult medical examination without abnormal findings: Secondary | ICD-10-CM

## 2023-03-03 NOTE — Progress Notes (Signed)
Subjective:   GERREN COGLIANO is a 82 y.o. male who presents for Medicare Annual/Subsequent preventive examination.  Review of Systems     Cardiac Risk Factors include: advanced age (>51men, >35 women);male gender;diabetes mellitus     Objective:    Today's Vitals   03/03/23 1544  BP: 110/62  Pulse: 60  Temp: 98.4 F (36.9 C)  TempSrc: Oral  SpO2: 96%  Weight: 173 lb 1.6 oz (78.5 kg)  Height: 5\' 7"  (1.702 m)   Body mass index is 27.11 kg/m.     03/03/2023    3:59 PM 02/20/2022    2:32 PM 02/19/2021    4:04 PM 10/17/2020    7:30 PM 05/09/2020    2:08 PM 08/29/2019    2:23 PM 06/09/2019    7:12 AM  Advanced Directives  Does Patient Have a Medical Advance Directive? No Yes Yes No No No No  Type of Special educational needs teacher of [place];Living will Healthcare Power of [place];Living will      Does patient want to make changes to medical advance directive?  No - Patient declined No - Patient declined      Copy of Healthcare Power of Attorney in Chart?  No - copy requested No - copy requested      Would patient like information on creating a medical advance directive? No - Patient declined   No - Patient declined No - Patient declined Yes (MAU/Ambulatory/Procedural Areas - Information given) No - Patient declined    Current Medications (verified) Outpatient Encounter Medications as of 03/03/2023  Medication Sig   Accu-Chek Softclix Lancets lancets 100 each by Other route 2 times daily at 12 noon and 4 pm. Use as instructed   acetaminophen (TYLENOL) 325 MG tablet Take 2 tablets (650 mg total) by mouth every 6 (six) hours as needed for mild pain (or Fever >/= 101).   Ascorbic Acid (VITAMIN C) 100 MG tablet Take 100 mg by mouth daily.   aspirin 81 MG tablet Take 81 mg by mouth daily.   bisoprolol (ZEBETA) 5 MG tablet TAKE 1/2 TABLET (2.5 MG) BY MOUTH EVERY MONDAY, WEDNESDAY AND FRIDAY.   Blood Glucose Monitoring Suppl (ACCU-CHEK GUIDE) w/Device KIT 1 kit by Other route  2 (two) times daily after a meal.   Calcium Citrate-Vitamin D (CALCIUM CITRATE +D PO) Take 1 tablet by mouth daily.    cyanocobalamin 1000 MCG tablet Take 1,000 mcg by mouth daily.    cyclobenzaprine (FLEXERIL) 10 MG tablet Take 1 tablet (10 mg total) by mouth 3 (three) times daily as needed for muscle spasms.   fexofenadine (ALLEGRA) 180 MG tablet Take 1 tablet (180 mg total) by mouth daily.   Fluticasone-Umeclidin-Vilant (TRELEGY ELLIPTA) 100-62.5-25 MCG/INH AEPB Inhale 1 puff into the lungs daily.   glucose blood test strip 1 each by Other route 2 times daily at 12 noon and 4 pm. Use as instructed 2x a day - AccuChek   insulin glargine (LANTUS SOLOSTAR) 100 UNIT/ML Solostar Pen Inject 30 Units into the skin every morning. And pen needles 1/day   Insulin Pen Needle (RELION PEN NEEDLES) 32G X 4 MM MISC USE DAILY   Ipratropium-Albuterol (COMBIVENT) 20-100 MCG/ACT AERS respimat Inhale 1 puff into the lungs 4 (four) times daily as needed for wheezing.   nitroGLYCERIN (NITROSTAT) 0.4 MG SL tablet PLACE 1 TABLET (0.4 MG TOTAL) UNDER THE TONGUE EVERY 5 (FIVE) MINUTES AS NEEDED FOR CHEST PAIN.   Vitamin E 400 units TABS Take 400 Units by  mouth daily.   Zinc 100 MG TABS Take 100 mg by mouth daily.   [DISCONTINUED] atorvastatin (LIPITOR) 10 MG tablet Take 1 tablet (10 mg total) by mouth daily. (Patient not taking: Reported on 09/26/2020)   [DISCONTINUED] empagliflozin (JARDIANCE) 10 MG TABS tablet Take 1 tablet (10 mg total) by mouth daily before breakfast.   [DISCONTINUED] finasteride (PROSCAR) 5 MG tablet Take 5 mg by mouth daily.   No facility-administered encounter medications on file as of 03/03/2023.    Allergies (verified) Aspirin, Oxycodone, Penicillins, Percocet [oxycodone-acetaminophen], Ciprofloxacin, and Prednisone   History: Past Medical History:  Diagnosis Date   Abnormal nuclear stress test 06/02/2019   Acute respiratory failure due to COVID-19 (HCC) 10/17/2020   Anemia    Angina  pectoris (HCC) 06/02/2019   Formatting of this note might be different from the original. 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 Abnormal EGD 8/17   Headache 01/06/2021   Helicobacter pylori gastritis 07/21/2016   Medicare annual wellness visit, initial 01/05/2017   Formatting of this note might be different from the original. 3/18   Mixed dyslipidemia 01/12/2020   Neck pain 02/16/2020   Other chronic pain 09/02/2020   Penicillin allergy 07/21/2016   Pneumonia    Pneumonia due to COVID-19 virus 10/17/2020   Pulmonary nodule 02/28/2014   Formatting of this note might be different from the original. RML, no change   Right groin pain 06/16/2019   Skin cancer    Trigeminal neuralgia 07/26/2020   Type 2 diabetes mellitus without complication, without long-term current use of insulin (HCC) 04/11/2021   Past Surgical History:  Procedure Laterality Date   APPENDECTOMY     back fusion     cataract surgery Left    CHOLECYSTECTOMY     COLONOSCOPY  2014   clear    LEFT HEART CATH AND CORONARY ANGIOGRAPHY N/A 06/09/2019   Procedure: LEFT HEART CATH AND CORONARY ANGIOGRAPHY;  Surgeon: Corky Crafts, MD;  Location: [facility];  Service: Cardiovascular;  Laterality: N/A;   SPINE SURGERY  1984   thoracic spine fusion    Family History  Problem Relation Age of Onset   Colon  cancer Mother    Diabetes Father    Heart disease Father    Stroke Father    Hypertension Father    Hyperlipidemia Father    Heart disease Brother    Pancreatic cancer Cousin        x 2   Prostate cancer Cousin    Heart disease Son    Social History   Socioeconomic History   Marital status: Significant Other    Spouse name: Not on file   Number of children: 3   Years of education: 7   Highest education level: Not on file  Occupational History   Occupation: retired    Associate Professor: [employer]  Tobacco Use   Smoking status: Former  Packs/day: 1.50    Years: 64.00    Additional pack years: 0.00    Total pack years: 96.00    Types: Cigarettes    Quit date: 2007    Years since quitting: 17.3   Smokeless tobacco: Never  Vaping Use   Vaping Use: Never used  Substance and Sexual Activity   Alcohol use: No   Drug use: No   Sexual activity: Not on file  Other Topics Concern   Not on file  Social History Narrative   Lives with significant other in a 2 story home.  Has 3 children.  Retired.  Education: 7th grade.    Social Determinants of Health   Financial Resource Strain: Low Risk  (03/03/2023)   Overall Financial Resource Strain (CARDIA)    Difficulty of Paying Living Expenses: Not hard at all  Food Insecurity: No Food Insecurity (03/03/2023)   Hunger Vital Sign    Worried About Running Out of Food in the Last Year: Never true    Ran Out of Food in the Last Year: Never true  Transportation Needs: No Transportation Needs (03/03/2023)   PRAPARE - Administrator, Civil Service (Medical): No    Lack of Transportation (Non-Medical): No  Physical Activity: Inactive (03/03/2023)   Exercise Vital Sign    Days of Exercise per Week: 0 days    Minutes of Exercise per Session: 0 min  Stress: No Stress Concern Present (03/03/2023)   [employer] of Occupational Health - Occupational Stress Questionnaire    Feeling of Stress : Not at all  Social Connections:  Moderately Isolated (03/03/2023)   Social Connection and Isolation Panel [NHANES]    Frequency of Communication with Friends and Family: More than three times a week    Frequency of Social Gatherings with Friends and Family: More than three times a week    Attends Religious Services: Never    Database administrator or Organizations: No    Attends Engineer, structural: Never    Marital Status: Married    Tobacco Counseling Counseling given: Not Answered   Clinical Intake:  Pre-visit preparation completed: No  Pain : No/denies pain   Nutrition Risk Assessment:  Has the patient had any N/V/D within the last 2 months?  No  Does the patient have any non-healing wounds?  No  Has the patient had any unintentional weight loss or weight gain?  No   Diabetes:  Is the patient diabetic?  Yes  If diabetic, was a CBG obtained today?  Yes CBG 164 Taken by patient Did the patient bring in their glucometer from home?  No  How often do you monitor your CBG's? Daily.   Financial Strains and Diabetes Management:  Are you having any financial strains with the device, your supplies or your medication? No .  Does the patient want to be seen by Chronic Care Management for management of their diabetes?  No  Would the patient like to be referred to a Nutritionist or for Diabetic Management?  No   Diabetic Exams:  Diabetic Eye Exam: Completed . Overdue for diabetic eye exam. Pt has been advised about the importance in completing this exam. A referral has been placed today. Message sent to referral coordinator for scheduling purposes. Advised pt to expect a call from office referred to regarding appt.  Diabetic Foot Exam: Completed . Pt has been advised about the importance in completing this exam. Pt is scheduled for diabetic foot exam on Followed by  Dr Elvera Lennox.    BMI - recorded: 27.11 Nutritional Status: BMI 25 -29 Overweight Nutritional Risks: None Diabetes: Yes CBG done?: No Did pt.  bring in CBG monitor from home?: No  How often do you need to have someone help you when you read instructions, pamphlets, or other written materials from your doctor or pharmacy?: 1 - Never  Diabetic?  Yes  Interpreter Needed?: No  Information entered by :: Theresa Mulligan LPN   Activities of Daily Living    03/03/2023    3:56 PM  In your present state of health, do you have any difficulty performing the following activities:  Hearing? 0  Vision? 0  Difficulty concentrating or making decisions? 0  Walking or climbing stairs? 0  Dressing or bathing? 0  Doing errands, shopping? 0  Preparing Food and eating ? N  Using the Toilet? N  In the past six months, have you accidently leaked urine? N  Do you have problems with loss of bowel control? N  Managing your Medications? N  Managing your Finances? N  Housekeeping or managing your Housekeeping? N    Patient Care Team: Nelwyn Salisbury, MD as PCP - General (Family Medicine) Verner Chol, [facility] (Inactive) as Pharmacist (Pharmacist)  Indicate any recent Medical Services you may have received from other than [facility] providers in the past year (date may be approximate).     Assessment:   This is a routine wellness examination for [place].  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       Depression Screen    03/03/2023    3:55 PM 05/18/2022    3:04 PM 02/20/2022    2:24 PM 09/26/2021    4:58 PM 02/19/2021    4:16 PM 02/19/2021    4:01 PM 08/29/2019    2:25 PM  PHQ 2/9 Scores  PHQ  - 2 Score 0 2 0 0 0 0 0  PHQ- 9 Score  6  0       Fall Risk    03/03/2023    3:57 PM 05/18/2022    3:04 PM 02/20/2022    2:28 PM 09/26/2021    4:58 PM 02/19/2021    4:15 PM  Fall Risk   Falls in the past year? 0 1 1 0 0  Number falls in past yr: 0 0 0 0 0  Injury with Fall? 0 1 0 0 0  Comment   No Injury or medical attention needed    Risk for fall due to : No Fall Risks No Fall Risks No Fall Risks No Fall Risks   Follow up Falls prevention discussed Falls evaluation completed       FALL RISK PREVENTION PERTAINING TO THE HOME:  Any stairs in or around the home? Yes  If so, are there any without handrails? No  Home free of loose throw rugs in walkways, pet beds, electrical cords, etc? Yes  Adequate lighting in your home to reduce risk of  falls? Yes   ASSISTIVE DEVICES UTILIZED TO PREVENT FALLS:  Life alert? No  Use of a cane, walker or w/c? No  Grab bars in the bathroom? No  Shower chair or bench in shower? Yes Elevated toilet seat or a handicapped toilet? Yes   TIMED UP AND GO:  Was the test performed? Yes .  Length of time to ambulate 10 feet: 10 sec.   Gait steady and fast without use of assistive device  Cognitive Function:        03/03/2023    3:59 PM 02/20/2022    2:32 PM  6CIT Screen  What Year? 0 points 0 points  What month? 0 points 0 points  What time? 0 points 0 points  Count back from 20 0 points 0 points  Months in reverse 0 points 0 points  Repeat phrase 0 points 0 points  Total Score 0 points 0 points    Immunizations Immunization History  Administered Date(s) Administered   Influenza-Unspecified 08/17/2012   Pneumococcal Polysaccharide-23 03/31/2002, 05/11/2016   Tdap 06/13/2015   Zoster, Live 12/29/2012    TDAP status: Up to date  Flu Vaccine status: Up to date  Pneumococcal vaccine status: Declined,  Education has been provided regarding the importance of this vaccine but patient still declined. Advised may receive this vaccine at local  pharmacy or Health Dept. Aware to provide a copy of the vaccination record if obtained from local pharmacy or Health Dept. Verbalized acceptance and understanding.   Covid-19 vaccine status: Declined, Education has been provided regarding the importance of this vaccine but patient still declined. Advised may receive this vaccine at local pharmacy or Health Dept.or vaccine clinic. Aware to provide a copy of the vaccination record if obtained from local pharmacy or Health Dept. Verbalized acceptance and understanding.  Qualifies for Shingles Vaccine? Yes   Zostavax completed No   Shingrix Completed?: No.    Education has been provided regarding the importance of this vaccine. Patient has been advised to call insurance company to determine out of pocket expense if they have not yet received this vaccine. Advised may also receive vaccine at local pharmacy or Health Dept. Verbalized acceptance and understanding.  Screening Tests Health Maintenance  Topic Date Due   FOOT EXAM  06/24/2022   OPHTHALMOLOGY EXAM  03/03/2023 (Originally 09/10/1951)   Diabetic kidney evaluation - Urine ACR  03/04/2023 (Originally 09/10/1959)   COVID-19 Vaccine (1) 03/19/2023 (Originally 09/11/1946)   Zoster Vaccines- Shingrix (1 of 2) 06/03/2023 (Originally 09/10/1960)   Lung Cancer Screening  03/02/2024 (Originally 01/21/2022)   Pneumonia Vaccine 75+ Years old (2 of 2 - PCV) 03/02/2024 (Originally 05/11/2017)   Diabetic kidney evaluation - eGFR measurement  05/23/2023   INFLUENZA VACCINE  05/27/2023   HEMOGLOBIN A1C  08/03/2023   Medicare Annual Wellness (AWV)  03/02/2024   DTaP/Tdap/Td (2 - Td or Tdap) 06/12/2025   HPV VACCINES  Aged Out    Health Maintenance  Health Maintenance Due  Topic Date Due   FOOT EXAM  06/24/2022    Colorectal cancer screening: No longer required.   Lung Cancer Screening: (Low Dose CT Chest recommended if Age 20-80 years, 30 pack-year currently smoking OR have quit w/in 15years.) does  not qualify.     Additional Screening: Hepatitis C Screening: does not qualify; Completed   Vision Screening: Recommended annual ophthalmology exams for early detection of glaucoma and other disorders of the eye. Is the patient up to date with their annual eye exam?  Yes  Who  is the provider or what is the name of the office in which the patient attends annual eye exams? Dr Sondra Barges If pt is not established with a provider, would they like to be referred to a provider to establish care? No .   Dental Screening: Recommended annual dental exams for proper oral hygiene  Community Resource Referral / Chronic Care Management:  CRR required this visit?  No   CCM required this visit?  No      Plan:     I have personally reviewed and noted the following in the patient's chart:   Medical and social history Use of alcohol, tobacco or illicit drugs  Current medications and supplements including opioid prescriptions. Patient is not currently taking opioid prescriptions. Functional ability and status Nutritional status Physical activity Advanced directives List of other physicians Hospitalizations, surgeries, and ER visits in previous 12 months Vitals Screenings to include cognitive, depression, and falls Referrals and appointments  In addition, I have reviewed and discussed with patient certain preventive protocols, quality metrics, and best practice recommendations. A written personalized care plan for preventive services as well as general preventive health recommendations were provided to patient.     Tillie Rung, LPN   04/03/6294   Nurse Notes: Patient due Diabetic Kidney Evaluation-Urine ACR

## 2023-03-03 NOTE — Patient Instructions (Addendum)
Bill [name] , Thank you for taking time to come for your Medicare Wellness Visit. I appreciate your ongoing commitment to your health goals. Please review the following plan we discussed and let me know if I can assist you in the future.   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        This is a list of the screening recommended for you and due dates:  Health Maintenance  Topic Date Due   Complete foot exam   06/24/2022   Eye exam for diabetics  03/03/2023*   Yearly kidney health urinalysis for diabetes  03/04/2023*   COVID-19 Vaccine (1) 03/19/2023*   Zoster (Shingles) Vaccine (1 of 2) 06/03/2023*   Screening for Lung Cancer  03/02/2024*   Pneumonia Vaccine (2 of 2 - PCV) 03/02/2024*   Yearly kidney function blood test for diabetes  05/23/2023   Flu Shot  05/27/2023   Hemoglobin A1C  08/03/2023   Medicare Annual Wellness Visit  03/02/2024   DTaP/Tdap/Td vaccine (2 - Td or Tdap) 06/12/2025   HPV Vaccine  Aged Out  *Topic was postponed. The date shown is not the original due date.    Advanced directives: Advance directive discussed with you today. Even though you declined this today, please call our office should you change your mind, and we can give you the proper paperwork for you to fill out.   Conditions/risks identified: None  Next appointment: Follow up in one year for your annual wellness visit.   Preventive Care 98 Years and Older, Male  Preventive care refers to lifestyle choices and visits with your health care provider that can promote health and wellness. What does preventive care include? A yearly physical exam. This is also called an annual well check. Dental exams once or twice a year. Routine eye exams. Ask your health care provider how often you should have your eyes  checked. Personal lifestyle choices, including: Daily care of your teeth and gums. Regular physical activity. Eating a healthy diet. Avoiding tobacco and drug use. Limiting alcohol use. Practicing safe sex. Taking low doses of aspirin every day. Taking vitamin and mineral supplements as recommended by your health care provider. What happens during an annual well check? The services and screenings done by your health care provider during your annual well check will depend on your age, overall health, lifestyle risk factors, and family history of disease. Counseling  Your health care provider may ask you questions about your: Alcohol use. Tobacco use. Drug use. Emotional well-being. Home and relationship well-being. Sexual activity. Eating habits. History of falls. Memory and ability to understand (cognition). Work and work Astronomer. Screening  You may have the following tests or measurements: Height, weight, and BMI. Blood pressure. Lipid and cholesterol levels. These may be checked every 5 years, or more frequently if you are over 48 years old. Skin check. Lung cancer screening. You may have this screening every year starting at age 75 if you have a 30-pack-year history of smoking and currently smoke or have quit within the past 15 years. Fecal occult blood test (FOBT) of the stool. You may have this test every year starting at age 16. Flexible sigmoidoscopy or colonoscopy. You may have a  sigmoidoscopy every 5 years or a colonoscopy every 10 years starting at age 95. Prostate cancer screening. Recommendations will vary depending on your family history and other risks. Hepatitis C blood test. Hepatitis B blood test. Sexually transmitted disease (STD) testing. Diabetes screening. This is done by checking your blood sugar (glucose) after you have not eaten for a while (fasting). You may have this done every 1-3 years. Abdominal aortic aneurysm (AAA) screening. You may need this  if you are a current or former smoker. Osteoporosis. You may be screened starting at age 24 if you are at high risk. Talk with your health care provider about your test results, treatment options, and if necessary, the need for more tests. Vaccines  Your health care provider may recommend certain vaccines, such as: Influenza vaccine. This is recommended every year. Tetanus, diphtheria, and acellular pertussis (Tdap, Td) vaccine. You may need a Td booster every 10 years. Zoster vaccine. You may need this after age 5. Pneumococcal 13-valent conjugate (PCV13) vaccine. One dose is recommended after age 25. Pneumococcal polysaccharide (PPSV23) vaccine. One dose is recommended after age 70. Talk to your health care provider about which screenings and vaccines you need and how often you need them. This information is not intended to replace advice given to you by your health care provider. Make sure you discuss any questions you have with your health care provider. Document Released: 11/08/2015 Document Revised: 07/01/2016 Document Reviewed: 08/13/2015 Elsevier Interactive Patient Education  2017 [employer] Prevention in the Home Falls can cause injuries. They can happen to people of all ages. There are many things you can do to make your home safe and to help prevent falls. What can I do on the outside of my home? Regularly fix the edges of walkways and driveways and fix any cracks. Remove anything that might make you trip as you walk through a door, such as a raised step or threshold. Trim any bushes or trees on the path to your home. Use bright outdoor lighting. Clear any walking paths of anything that might make someone trip, such as rocks or tools. Regularly check to see if handrails are loose or broken. Make sure that both sides of any steps have handrails. Any raised decks and porches should have guardrails on the edges. Have any leaves, snow, or ice cleared regularly. Use sand  or salt on walking paths during winter. Clean up any spills in your garage right away. This includes oil or grease spills. What can I do in the bathroom? Use night lights. Install grab bars by the toilet and in the tub and shower. Do not use towel bars as grab bars. Use non-skid mats or decals in the tub or shower. If you need to sit down in the shower, use a plastic, non-slip stool. Keep the floor dry. Clean up any water that spills on the floor as soon as it happens. Remove soap buildup in the tub or shower regularly. Attach bath mats securely with double-sided non-slip rug tape. Do not have throw rugs and other things on the floor that can make you trip. What can I do in the bedroom? Use night lights. Make sure that you have a light by your bed that is easy to reach. Do not use any sheets or blankets that are too big for your bed. They should not hang down onto the floor. Have a firm chair that has side arms. You can use this for support while you get dressed. Do not  have throw rugs and other things on the floor that can make you trip. What can I do in the kitchen? Clean up any spills right away. Avoid walking on wet floors. Keep items that you use a lot in easy-to-reach places. If you need to reach something above you, use a strong step stool that has a grab bar. Keep electrical cords out of the way. Do not use floor polish or wax that makes floors slippery. If you must use wax, use non-skid floor wax. Do not have throw rugs and other things on the floor that can make you trip. What can I do with my stairs? Do not leave any items on the stairs. Make sure that there are handrails on both sides of the stairs and use them. Fix handrails that are broken or loose. Make sure that handrails are as long as the stairways. Check any carpeting to make sure that it is firmly attached to the stairs. Fix any carpet that is loose or worn. Avoid having throw rugs at the top or bottom of the stairs.  If you do have throw rugs, attach them to the floor with carpet tape. Make sure that you have a light switch at the top of the stairs and the bottom of the stairs. If you do not have them, ask someone to add them for you. What else can I do to help prevent falls? Wear shoes that: Do not have high heels. Have rubber bottoms. Are comfortable and fit you well. Are closed at the toe. Do not wear sandals. If you use a stepladder: Make sure that it is fully opened. Do not climb a closed stepladder. Make sure that both sides of the stepladder are locked into place. Ask someone to hold it for you, if possible. Clearly mark and make sure that you can see: Any grab bars or handrails. First and last steps. Where the edge of each step is. Use tools that help you move around (mobility aids) if they are needed. These include: Canes. Walkers. Scooters. Crutches. Turn on the lights when you go into a dark area. Replace any light bulbs as soon as they burn out. Set up your furniture so you have a clear path. Avoid moving your furniture around. If any of your floors are uneven, fix them. If there are any pets around you, be aware of where they are. Review your medicines with your doctor. Some medicines can make you feel dizzy. This can increase your chance of falling. Ask your doctor what other things that you can do to help prevent falls. This information is not intended to replace advice given to you by your health care provider. Make sure you discuss any questions you have with your health care provider. Document Released: 08/08/2009 Document Revised: 03/19/2016 Document Reviewed: 11/16/2014 Elsevier Interactive Patient Education  2017 [employer].

## 2023-03-09 ENCOUNTER — Other Ambulatory Visit: Payer: Self-pay

## 2023-03-09 ENCOUNTER — Telehealth: Payer: Self-pay | Admitting: Family Medicine

## 2023-03-09 MED ORDER — BISOPROLOL FUMARATE 5 MG PO TABS: ORAL_TABLET | ORAL | 0 refills | Status: DC

## 2023-03-09 NOTE — Telephone Encounter (Signed)
Rx sent 

## 2023-03-09 NOTE — Telephone Encounter (Signed)
Prescription Request  03/09/2023  LOV: 05/18/2022  What is the name of the medication or equipment?  bisoprolol (ZEBETA) 5 MG tablet  Have you contacted your pharmacy to request a refill? Yes   Which pharmacy would you like this sent to?   [facility] - [place], [place] - [address] [place] [address] Phone: [phone] Fax: [phone]    Patient notified that their request is being sent to the clinical staff for review and that they should receive a response within 2 business days.   Please advise at Mobile [phone] (mobile)

## 2023-03-10 ENCOUNTER — Other Ambulatory Visit: Payer: Self-pay

## 2023-03-15 ENCOUNTER — Encounter: Payer: Self-pay | Admitting: Family Medicine

## 2023-03-15 ENCOUNTER — Ambulatory Visit (INDEPENDENT_AMBULATORY_CARE_PROVIDER_SITE_OTHER): Payer: Medicare HMO | Admitting: Family Medicine

## 2023-03-15 VITALS — BP 120/70 | HR 58 | Temp 98.1°F | Wt 177.0 lb

## 2023-03-15 DIAGNOSIS — I2510 Atherosclerotic heart disease of native coronary artery without angina pectoris: Secondary | ICD-10-CM

## 2023-03-15 DIAGNOSIS — R030 Elevated blood-pressure reading, without diagnosis of hypertension: Secondary | ICD-10-CM | POA: Diagnosis not present

## 2023-03-15 MED ORDER — BISOPROLOL FUMARATE 5 MG PO TABS: ORAL_TABLET | ORAL | 3 refills | Status: DC

## 2023-03-15 NOTE — Progress Notes (Signed)
   Subjective:    Patient ID: EARNIE STALL, male    DOB: May 13, 1941, 82 y.o.   MRN: [id number]  HPI Here to follow up on BP and CAD. He feels well in general. His BP is stable. No chest pain or SOB. He sees Dr. Tomie China yearly. He sees Pulmonology for the COPD. He sees Dr. Elvera Lennox for the diabetes. His A1c on 02-01-23 was 7.1%.    Review of Systems  Constitutional: Negative.   Respiratory: Negative.    Cardiovascular: Negative.   Gastrointestinal: Negative.   Genitourinary: Negative.        Objective:   Physical Exam Constitutional:      Appearance: Normal appearance. He is not ill-appearing.  Cardiovascular:     Rate and Rhythm: Normal rate and regular rhythm.     Pulses: Normal pulses.     Heart sounds: Normal heart sounds.  Pulmonary:     Effort: Pulmonary effort is normal.     Breath sounds: Normal breath sounds.  Neurological:     General: No focal deficit present.     Mental Status: He is alert and oriented to person, place, and time.           Assessment & Plan:  His CAD and BP are stable. His COPD and diabetes are stable. He will schedule a well exam with [place] soon to check prostate, lipids, etc.  Gershon Crane, MD

## 2023-03-30 ENCOUNTER — Encounter: Payer: Self-pay | Admitting: Family Medicine

## 2023-03-30 ENCOUNTER — Ambulatory Visit (INDEPENDENT_AMBULATORY_CARE_PROVIDER_SITE_OTHER): Payer: Medicare HMO | Admitting: Family Medicine

## 2023-03-30 VITALS — BP 120/76 | HR 56 | Temp 98.5°F | Ht 67.0 in | Wt 174.4 lb

## 2023-03-30 DIAGNOSIS — I2510 Atherosclerotic heart disease of native coronary artery without angina pectoris: Secondary | ICD-10-CM

## 2023-03-30 DIAGNOSIS — F419 Anxiety disorder, unspecified: Secondary | ICD-10-CM

## 2023-03-30 DIAGNOSIS — E1165 Type 2 diabetes mellitus with hyperglycemia: Secondary | ICD-10-CM

## 2023-03-30 DIAGNOSIS — D471 Chronic myeloproliferative disease: Secondary | ICD-10-CM | POA: Diagnosis not present

## 2023-03-30 DIAGNOSIS — Z794 Long term (current) use of insulin: Secondary | ICD-10-CM | POA: Diagnosis not present

## 2023-03-30 DIAGNOSIS — E1159 Type 2 diabetes mellitus with other circulatory complications: Secondary | ICD-10-CM | POA: Diagnosis not present

## 2023-03-30 DIAGNOSIS — E782 Mixed hyperlipidemia: Secondary | ICD-10-CM | POA: Diagnosis not present

## 2023-03-30 DIAGNOSIS — J439 Emphysema, unspecified: Secondary | ICD-10-CM

## 2023-03-30 DIAGNOSIS — N138 Other obstructive and reflux uropathy: Secondary | ICD-10-CM

## 2023-03-30 DIAGNOSIS — N401 Enlarged prostate with lower urinary tract symptoms: Secondary | ICD-10-CM | POA: Diagnosis not present

## 2023-03-30 DIAGNOSIS — K2100 Gastro-esophageal reflux disease with esophagitis, without bleeding: Secondary | ICD-10-CM

## 2023-03-30 DIAGNOSIS — J431 Panlobular emphysema: Secondary | ICD-10-CM

## 2023-03-30 NOTE — Progress Notes (Signed)
Subjective:    Patient ID: Bill [name], male    DOB: [birth year], 82 y.o.   MRN: [id number]  HPI Here to follow up on issues. He feels well in general. He sees Dr. Elvera Lennox for his type 2 diabetes, and his A1c on 02-01-23 was 7.1%. He sees Dr. Wynona Neat for COPD. He sees Dr. Tomie China for CAD. 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 He gets eye exams yearly. He remains active, in fact his currently repairing a roof on a barn. He has no complaints today.    Review of Systems  Constitutional: Negative.   HENT: Negative.    Eyes: Negative.   Respiratory:  Positive for shortness of breath.   Cardiovascular: Negative.   Gastrointestinal: Negative.   Genitourinary: Negative.   Musculoskeletal: Negative.   Skin: Negative.   Neurological: Negative.   Psychiatric/Behavioral: Negative.         Objective:   Physical Exam Constitutional:      General: He is not in acute distress.    Appearance: Normal appearance. He is well-developed. He is not diaphoretic.  HENT:     Head: Normocephalic and atraumatic.     Right Ear: External ear normal.     Left Ear: External ear normal.     Nose: Nose normal.     Mouth/Throat:     Pharynx: No oropharyngeal exudate.  Eyes:     General: No scleral icterus.       Right eye: No discharge.        Left eye: No discharge.     Conjunctiva/sclera: Conjunctivae normal.     Pupils: Pupils are equal, round, and reactive to light.  Neck:     Thyroid: No thyromegaly.     Vascular: No JVD.     Trachea: No tracheal deviation.  Cardiovascular:     Rate and Rhythm: Normal rate and regular rhythm.     Heart sounds: Normal heart sounds. No murmur heard.    No friction rub. No gallop.  Pulmonary:     Effort: Pulmonary effort is normal. No respiratory distress.     Breath sounds: Wheezing present. No rales.  Chest:     Chest wall: No tenderness.  Abdominal:     General: Bowel sounds are normal. There is no distension.     Palpations: Abdomen  is soft. There is no mass.     Tenderness: There is no abdominal tenderness. There is no guarding or rebound.  Genitourinary:    Penis: Normal. No tenderness.      Testes: Normal.  Musculoskeletal:        General: No tenderness. Normal range of motion.     Cervical back: Neck supple.  Lymphadenopathy:     Cervical: No cervical adenopathy.  Skin:    General: Skin is warm and dry.     Coloration: Skin is not pale.     Findings: No erythema or rash.  Neurological:     Mental Status: He is alert and oriented to person, place, and time.     Cranial Nerves: No cranial nerve deficit.     Motor: No abnormal muscle tone.     Coordination: Coordination normal.     Deep Tendon Reflexes: Reflexes are normal and symmetric. Reflexes normal.  Psychiatric:        Behavior: Behavior normal.        Thought Content: Thought content normal.        Judgment: Judgment normal.  Assessment & Plan:  His diabetes and COPD and CAD are stable. His myeloproliferative neoplasm seems to be stable. We will get fasting labs to check lipids, PSA, etc. We spent a total of ( 33  ) minutes reviewing records and discussing these issues.   Gershon Crane, MD

## 2023-03-31 LAB — HEPATIC FUNCTION PANEL
ALT: 39 U/L (ref 0–53)
AST: 38 U/L — ABNORMAL HIGH (ref 0–37)
Albumin: 4.1 g/dL (ref 3.5–5.2)
Alkaline Phosphatase: 110 U/L (ref 39–117)
Bilirubin, Direct: 0.1 mg/dL (ref 0.0–0.3)
Total Bilirubin: 0.8 mg/dL (ref 0.2–1.2)
Total Protein: 6.9 g/dL (ref 6.0–8.3)

## 2023-03-31 LAB — CBC WITH DIFFERENTIAL/PLATELET
Basophils Absolute: 0.2 10*3/uL — ABNORMAL HIGH (ref 0.0–0.1)
Basophils Relative: 1.5 % (ref 0.0–3.0)
Eosinophils Absolute: 0.6 10*3/uL (ref 0.0–0.7)
Eosinophils Relative: 3.5 % (ref 0.0–5.0)
HCT: 48.2 % (ref 39.0–52.0)
Hemoglobin: 15.7 g/dL (ref 13.0–17.0)
Lymphocytes Relative: 16.6 % (ref 12.0–46.0)
Lymphs Abs: 2.8 10*3/uL (ref 0.7–4.0)
MCHC: 32.5 g/dL (ref 30.0–36.0)
MCV: 96.7 fl (ref 78.0–100.0)
Monocytes Absolute: 0.2 10*3/uL (ref 0.1–1.0)
Monocytes Relative: 1.2 % — ABNORMAL LOW (ref 3.0–12.0)
Neutro Abs: 13.1 10*3/uL — ABNORMAL HIGH (ref 1.4–7.7)
Neutrophils Relative %: 77.2 % — ABNORMAL HIGH (ref 43.0–77.0)
Platelets: 300 10*3/uL (ref 150.0–400.0)
RBC: 4.99 Mil/uL (ref 4.22–5.81)
RDW: 18.2 % — ABNORMAL HIGH (ref 11.5–15.5)
WBC: 16.9 10*3/uL — ABNORMAL HIGH (ref 4.0–10.5)

## 2023-03-31 LAB — BASIC METABOLIC PANEL
BUN: 10 mg/dL (ref 6–23)
CO2: 21 mEq/L (ref 19–32)
Calcium: 9.5 mg/dL (ref 8.4–10.5)
Chloride: 104 mEq/L (ref 96–112)
Creatinine, Ser: 0.88 mg/dL (ref 0.40–1.50)
GFR: 81.19 mL/min (ref >60.00)
Glucose, Bld: 87 mg/dL (ref 70–99)
Potassium: 3.9 mEq/L (ref 3.5–5.1)
Sodium: 143 mEq/L (ref 135–145)

## 2023-03-31 LAB — LIPID PANEL
Cholesterol: 167 mg/dL (ref 0–200)
HDL: 45.2 mg/dL (ref >39.00)
LDL Cholesterol: 99 mg/dL (ref 0–99)
NonHDL: 122.11
Total CHOL/HDL Ratio: 4
Triglycerides: 118 mg/dL (ref 0.0–149.0)
VLDL: 23.6 mg/dL (ref 0.0–40.0)

## 2023-03-31 LAB — PSA: PSA: 0.25 ng/mL (ref 0.10–4.00)

## 2023-03-31 LAB — TSH: TSH: 1.92 u[IU]/mL (ref 0.35–5.50)

## 2023-04-02 ENCOUNTER — Telehealth: Payer: Self-pay | Admitting: Family Medicine

## 2023-04-02 NOTE — Telephone Encounter (Signed)
Spoke with pt reviewed lab results, verbalized understanding 

## 2023-04-02 NOTE — Telephone Encounter (Signed)
Pt call and stated he is returning your call and want you to give him a call back. 

## 2023-05-26 ENCOUNTER — Inpatient Hospital Stay: Payer: Medicare HMO | Admitting: Hematology and Oncology

## 2023-05-26 ENCOUNTER — Inpatient Hospital Stay: Payer: Medicare HMO | Attending: Hematology and Oncology

## 2023-05-26 ENCOUNTER — Other Ambulatory Visit: Payer: Self-pay

## 2023-05-26 ENCOUNTER — Other Ambulatory Visit: Payer: Self-pay | Admitting: Hematology and Oncology

## 2023-05-26 VITALS — BP 150/77 | HR 60 | Temp 97.3°F | Resp 16 | Wt 177.0 lb

## 2023-05-26 DIAGNOSIS — Z87891 Personal history of nicotine dependence: Secondary | ICD-10-CM | POA: Insufficient documentation

## 2023-05-26 DIAGNOSIS — D471 Chronic myeloproliferative disease: Secondary | ICD-10-CM

## 2023-05-26 DIAGNOSIS — Z800 Family history of malignant neoplasm of digestive organs: Secondary | ICD-10-CM | POA: Diagnosis not present

## 2023-05-26 DIAGNOSIS — D72825 Bandemia: Secondary | ICD-10-CM | POA: Diagnosis not present

## 2023-05-26 DIAGNOSIS — D72829 Elevated white blood cell count, unspecified: Secondary | ICD-10-CM | POA: Insufficient documentation

## 2023-05-26 DIAGNOSIS — Z8616 Personal history of COVID-19: Secondary | ICD-10-CM | POA: Diagnosis not present

## 2023-05-26 LAB — CBC WITH DIFFERENTIAL (CANCER CENTER ONLY)
Abs Immature Granulocytes: 0.16 10*3/uL — ABNORMAL HIGH (ref 0.00–0.07)
Basophils Absolute: 0.3 10*3/uL — ABNORMAL HIGH (ref 0.0–0.1)
Basophils Relative: 2 %
Eosinophils Absolute: 0.6 10*3/uL — ABNORMAL HIGH (ref 0.0–0.5)
Eosinophils Relative: 4 %
HCT: 48.3 % (ref 39.0–52.0)
Hemoglobin: 16.2 g/dL (ref 13.0–17.0)
Immature Granulocytes: 1 %
Lymphocytes Relative: 13 %
Lymphs Abs: 2 10*3/uL (ref 0.7–4.0)
MCH: 31.6 pg (ref 26.0–34.0)
MCHC: 33.5 g/dL (ref 30.0–36.0)
MCV: 94.3 fL (ref 80.0–100.0)
Monocytes Absolute: 0.6 10*3/uL (ref 0.1–1.0)
Monocytes Relative: 4 %
Neutro Abs: 11.6 10*3/uL — ABNORMAL HIGH (ref 1.7–7.7)
Neutrophils Relative %: 76 %
Platelet Count: 270 10*3/uL (ref 150–400)
RBC: 5.12 MIL/uL (ref 4.22–5.81)
RDW: 16.8 % — ABNORMAL HIGH (ref 11.5–15.5)
WBC Count: 15.3 10*3/uL — ABNORMAL HIGH (ref 4.0–10.5)
nRBC: 0 % (ref 0.0–0.2)

## 2023-05-26 LAB — CMP (CANCER CENTER ONLY)
ALT: 32 U/L (ref 0–44)
AST: 27 U/L (ref 15–41)
Albumin: 3.8 g/dL (ref 3.5–5.0)
Alkaline Phosphatase: 95 U/L (ref 38–126)
Anion gap: 5 (ref 5–15)
BUN: 9 mg/dL (ref 8–23)
CO2: 27 mmol/L (ref 22–32)
Calcium: 9.1 mg/dL (ref 8.9–10.3)
Chloride: 107 mmol/L (ref 98–111)
Creatinine: 0.86 mg/dL (ref 0.61–1.24)
GFR, Estimated: >60 mL/min (ref >60)
Glucose, Bld: 248 mg/dL — ABNORMAL HIGH (ref 70–99)
Potassium: 4.2 mmol/L (ref 3.5–5.1)
Sodium: 139 mmol/L (ref 135–145)
Total Bilirubin: 0.6 mg/dL (ref 0.3–1.2)
Total Protein: 6.4 g/dL — ABNORMAL LOW (ref 6.5–8.1)

## 2023-05-26 NOTE — Progress Notes (Signed)
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 Leonides Schanz. WBC 16.4, Hgb 15.6, MCV 96.7, Plt 294. MPN panel returns JAK2 positive 6) 11/13/2020: WBC 16.0, Hgb 13.5, MCV 94.4, Plt 392 6) 05/22/2022: WBC 15.3, Hgb 15.4, MCV 91.2, Plt 301  Interval History:  Bill [name] 82 y.o. male with medical history significant for JAK2 positive leukocytosis who presents for a follow up visit. The patient's last visit was on 05/22/2022. In the interim since the last visit he has no major changes in his health.  On exam today Bill [name] is accompanied by his wife.  He reports he has been well overall in the interim since her last visit and "staying busy".  He notes that he has been working outside Administrator, sports, ditch digging, and taking care of the yard.  He notes that his wife has to bring him in often if it is getting too hot.  He reports he does start the morning by sneezing frequently.  His weight has been stable and is currently at 177 pounds.  He is otherwise at his baseline level of health with no questions concerns or complaints today.  He currently denies having any issues with fevers, chills, sweats, nausea, vomiting or diarrhea.  His appetite has been good.  A full 10 point ROS is listed below.  MEDICAL HISTORY:  Past Medical History:  Diagnosis Date   Abnormal nuclear stress test 06/02/2019   Acute respiratory failure due to COVID-19 [facility]) 10/17/2020   Anemia    Angina pectoris (HCC)  06/02/2019   Formatting of this note might be different from the original. 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 Abnormal EGD 8/17   Headache 01/06/2021   Helicobacter pylori gastritis 07/21/2016   Medicare annual wellness visit, initial 01/05/2017   Formatting of this note might be different from the original. 3/18   Mixed dyslipidemia 01/12/2020   Neck pain 02/16/2020   Other chronic pain 09/02/2020   Penicillin allergy 07/21/2016   Pneumonia    Pneumonia due to COVID-19 virus 10/17/2020   Pulmonary nodule 02/28/2014   Formatting of this note might be different from the original. RML, no change   Right groin pain 06/16/2019   Skin cancer    Trigeminal neuralgia 07/26/2020   Type 2 diabetes mellitus without complication, without long-term current use of insulin (HCC) 04/11/2021    SURGICAL HISTORY: Past Surgical History:  Procedure Laterality Date  APPENDECTOMY     back fusion     cataract surgery Left    CHOLECYSTECTOMY     COLONOSCOPY  2014   clear    LEFT HEART CATH AND CORONARY ANGIOGRAPHY N/A 06/09/2019   Procedure: LEFT HEART CATH AND CORONARY ANGIOGRAPHY;  Surgeon: Corky Crafts, MD;  Location: [facility] INVASIVE CV LAB;  Service: Cardiovascular;  Laterality: N/A;   SPINE SURGERY  1984   thoracic spine fusion     SOCIAL HISTORY: Social History   Socioeconomic  History   Marital status: Significant Other    Spouse name: Not on file   Number of children: 3   Years of education: 7   Highest education level: Not on file  Occupational History   Occupation: retired    Associate Professor: [employer]  Tobacco Use   Smoking status: Former    Current packs/day: 0.00    Average packs/day: 1.5 packs/day for 64.0 years (96.0 ttl pk-yrs)    Types: Cigarettes    Start date: 9    Quit date: 2007    Years since quitting: 17.5   Smokeless tobacco: Never  Vaping Use   Vaping status: Never Used  Substance and Sexual Activity   Alcohol use: No   Drug use: No   Sexual activity: Not on file  Other Topics Concern   Not on file  Social History Narrative   Lives with significant other in a 2 story home.  Has 3 children.  Retired.  Education: 7th grade.    Social Determinants of Health   Financial Resource Strain: Low Risk  (03/03/2023)   Overall Financial Resource Strain (CARDIA)    Difficulty of Paying Living Expenses: Not hard at all  Food Insecurity: No Food Insecurity (03/03/2023)   Hunger Vital Sign    Worried About Running Out of Food in the Last Year: Never true    Ran Out of Food in the Last Year: Never true  Transportation Needs: No Transportation Needs (03/03/2023)   PRAPARE - Administrator, Civil Service (Medical): No    Lack of Transportation (Non-Medical): No  Physical Activity: Inactive (03/03/2023)   Exercise Vital Sign    Days of Exercise per Week: 0 days    Minutes of Exercise per Session: 0 min  Stress: No Stress Concern Present (03/03/2023)   [employer] of Occupational Health - Occupational Stress Questionnaire    Feeling of Stress : Not at all  Social Connections: Moderately Isolated (03/03/2023)   Social Connection and Isolation Panel [NHANES]    Frequency of Communication with Friends and Family: More than three times a week    Frequency of Social Gatherings with Friends and Family: More than three times a week     Attends Religious Services: Never    Database administrator or Organizations: No    Attends Banker Meetings: Never    Marital Status: Married  Catering manager Violence: Not At Risk (03/03/2023)   Humiliation, Afraid, Rape, and Kick questionnaire    Fear of Current or Ex-Partner: No    Emotionally Abused: No    Physically Abused: No    Sexually Abused: No    FAMILY HISTORY: Family History  Problem Relation Age of Onset   Colon cancer Mother    Diabetes Father    Heart disease Father    Stroke Father    Hypertension Father    Hyperlipidemia Father    Heart disease Brother    Pancreatic cancer Cousin  x 2   Prostate cancer Cousin    Heart disease Son     ALLERGIES:  is allergic to aspirin, oxycodone, penicillins, percocet [oxycodone-acetaminophen], ciprofloxacin, and prednisone.  MEDICATIONS:  Current Outpatient Medications  Medication Sig Dispense Refill   Accu-Chek Softclix Lancets lancets 100 each by Other route 2 times daily at 12 noon and 4 pm. Use as instructed 100 each 12   acetaminophen (TYLENOL) 325 MG tablet Take 2 tablets (650 mg total) by mouth every 6 (six) hours as needed for mild pain (or Fever >/= 101).     Ascorbic Acid (VITAMIN C) 100 MG tablet Take 100 mg by mouth daily.     aspirin 81 MG tablet Take 81 mg by mouth daily.     bisoprolol (ZEBETA) 5 MG tablet TAKE 1/2 TABLET (2.5 MG) BY MOUTH EVERY MONDAY, WEDNESDAY AND FRIDAY. 135 tablet 3   Blood Glucose Monitoring Suppl (ACCU-CHEK GUIDE) w/Device KIT 1 kit by Other route 2 (two) times daily after a meal. 1 kit 0   Calcium Citrate-Vitamin D (CALCIUM CITRATE +D PO) Take 1 tablet by mouth daily.      cyanocobalamin 1000 MCG tablet Take 1,000 mcg by mouth daily.      cyclobenzaprine (FLEXERIL) 10 MG tablet Take 1 tablet (10 mg total) by mouth 3 (three) times daily as needed for muscle spasms. 60 tablet 0   fexofenadine (ALLEGRA) 180 MG tablet Take 1 tablet (180 mg total) by mouth daily. 90  tablet 2   glucose blood test strip 1 each by Other route 2 times daily at 12 noon and 4 pm. Use as instructed 2x a day - AccuChek 200 each 3   insulin glargine (LANTUS SOLOSTAR) 100 UNIT/ML Solostar Pen Inject 30 Units into the skin every morning. And pen needles 1/day (Patient taking differently: Inject 30 Units into the skin every morning. And pen needles 1/day/ Accuchek) 30 mL 3   Insulin Pen Needle (RELION PEN NEEDLES) 32G X 4 MM MISC USE DAILY 100 each 3   Ipratropium-Albuterol (COMBIVENT) 20-100 MCG/ACT AERS respimat Inhale 1 puff into the lungs 4 (four) times daily as needed for wheezing. 4 g 1   nitroGLYCERIN (NITROSTAT) 0.4 MG SL tablet PLACE 1 TABLET (0.4 MG TOTAL) UNDER THE TONGUE EVERY 5 (FIVE) MINUTES AS NEEDED FOR CHEST PAIN. 25 tablet 1   Vitamin E 400 units TABS Take 400 Units by mouth daily.     Zinc 100 MG TABS Take 100 mg by mouth daily.     No current facility-administered medications for this visit.    REVIEW OF SYSTEMS:   Constitutional: ( - ) fevers, ( - )  chills , ( - ) night sweats Eyes: ( - ) blurriness of vision, ( - ) double vision, ( - ) watery eyes Ears, nose, mouth, throat, and face: ( - ) mucositis, ( - ) sore throat Respiratory: ( - ) cough, ( - ) dyspnea, ( - ) wheezes Cardiovascular: ( - ) palpitation, ( - ) chest discomfort, ( - ) lower extremity swelling Gastrointestinal:  ( - ) nausea, ( - ) heartburn, ( - ) change in bowel habits Skin: ( - ) abnormal skin rashes Lymphatics: ( - ) new lymphadenopathy, ( - ) easy bruising Neurological: ( - ) numbness, ( - ) tingling, ( - ) new weaknesses Behavioral/Psych: ( - ) mood change, ( - ) new changes  All other systems were reviewed with the patient and are negative.  PHYSICAL EXAMINATION: ECOG PERFORMANCE STATUS:  0 - Asymptomatic  Vitals:   05/26/23 1502  BP: (!) 150/77  Pulse: 60  Resp: 16  Temp: (!) 97.3 F (36.3 C)  SpO2: 98%     Filed Weights   05/26/23 1502  Weight: 177 lb (80.3 kg)       GENERAL: well appearing elderly Caucasian male. alert, no distress and comfortable SKIN: skin color, texture, turgor are normal, no rashes or significant lesions EYES: conjunctiva are pink and non-injected, sclera clear LUNGS: clear to auscultation and percussion with normal breathing effort HEART: regular rate & rhythm and no murmurs and no lower extremity edema Musculoskeletal: no cyanosis of digits and no clubbing  PSYCH: alert & oriented x 3, fluent speech NEURO: no focal motor/sensory deficits  LABORATORY DATA:  I have reviewed the data as listed    Latest Ref Rng & Units 05/26/2023    2:38 PM 03/30/2023    2:53 PM 05/22/2022    2:35 PM  CBC  WBC 4.0 - 10.5 K/uL 15.3  16.9  15.3   Hemoglobin 13.0 - 17.0 g/dL 16.1  09.6  04.5   Hematocrit 39.0 - 52.0 % 48.3  48.2  45.5   Platelets 150 - 400 K/uL 270  300.0  301        Latest Ref Rng & Units 05/26/2023    2:38 PM 03/30/2023    2:53 PM 05/22/2022    2:35 PM  CMP  Glucose 70 - 99 mg/dL 409  87  811   BUN 8 - 23 mg/dL 9  10  11    Creatinine 0.61 - 1.24 mg/dL 9.14  7.82  9.56   Sodium 135 - 145 mmol/L 139  143  135   Potassium 3.5 - 5.1 mmol/L 4.2  3.9  4.1   Chloride 98 - 111 mmol/L 107  104  103   CO2 22 - 32 mmol/L 27  21  27    Calcium 8.9 - 10.3 mg/dL 9.1  9.5  8.7   Total Protein 6.5 - 8.1 g/dL 6.4  6.9  6.3   Total Bilirubin 0.3 - 1.2 mg/dL 0.6  0.8  0.6   Alkaline Phos 38 - 126 U/L 95  110  132   AST 15 - 41 U/L 27  38  34   ALT 0 - 44 U/L 32  39  40     RADIOGRAPHIC STUDIES: No results found.   ASSESSMENT & PLAN ARUSH MICA 82 y.o. male with medical history significant for JAK2 positive leukocytosis who presents for a follow up visit.  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  The exact myeloproliferative neoplasm is not clear at this time as the patient only has  an elevation of white blood cell count with no elevation in his platelets or red blood cells.  Given this the patient does not require cytoreductive therapy and can be managed on baby aspirin alone, which he is already taking for his cardiac disease.  Technically in order to confirm the diagnosis of an MPN a bone marrow biopsy is required.  In this case it would confirm the diagnosis, however it would not change our management.  As his counts are currently within normal limits he would not require hydroxyurea or other treatments in order to lower the counts.  As such the bone marrow biopsy will be predominantly academic.  Myelofibrosis is also a  consideration, however given that there is no disease altering treatment we could wait until there was a drop in his blood counts before considering the bone marrow biopsy.  Overall I think we can continue with observation in order to assure that the patient does not convert into a leukemia or a myelofibrosis with cytopenias.  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 He has an MPN, but the exact one is not clear based off the peripheral blood finds.  --a bone marrow biopsy can be done to confirm a diagnosis, but with no elevated RBC or Plt counts we would not start cytoreductive therapy. The utility the bone marrow is for diagnosis only, it would not change the course of treatment --myelofibrosis is certainly on the differential, but as there is no treatment that alters the course of the disease it would be reasonable to hold until the patient began developing cytopenias before considering bone marrow biopsy.  Additionally has had remarkably stable counts for over a decade. --continue ASA 81mg  PO daily (patient takes for cardiac disease) -- Labs today show white blood cell count 15.3, hemoglobin 16.2, MCV 94.3, and platelets of 270 --RTC in 12 months for continued monitoring   No  orders of the defined types were placed in this encounter.   All questions were answered. The patient knows to call the clinic with any problems, questions or concerns.  A total of more than 25 minutes were spent on this encounter and over half of that time was spent on counseling and coordination of care as outlined above.   Ulysees Barns, MD Department of Hematology/Oncology [facility] at [facility] Phone: [phone] Pager: [phone] Email: [email or web address]  05/26/2023 3:42 PM

## 2023-05-29 DIAGNOSIS — Z23 Encounter for immunization: Secondary | ICD-10-CM | POA: Diagnosis not present

## 2023-05-29 DIAGNOSIS — S61210A Laceration without foreign body of right index finger without damage to nail, initial encounter: Secondary | ICD-10-CM | POA: Diagnosis not present

## 2023-06-15 NOTE — Progress Notes (Unsigned)
ACUTE VISIT No chief complaint on file.  HPI: [name] is a 82 y.o. male, who is here today complaining of *** HPI  Review of Systems See other pertinent positives and negatives in HPI.  Current Outpatient Medications on File Prior to Visit  Medication Sig Dispense Refill   Accu-Chek Softclix Lancets lancets 100 each by Other route 2 times daily at 12 noon and 4 pm. Use as instructed 100 each 12   acetaminophen (TYLENOL) 325 MG tablet Take 2 tablets (650 mg total) by mouth every 6 (six) hours as needed for mild pain (or Fever >/= 101).     Ascorbic Acid (VITAMIN C) 100 MG tablet Take 100 mg by mouth daily.     aspirin 81 MG tablet Take 81 mg by mouth daily.     bisoprolol (ZEBETA) 5 MG tablet TAKE 1/2 TABLET (2.5 MG) BY MOUTH EVERY MONDAY, WEDNESDAY AND FRIDAY. 135 tablet 3   Blood Glucose Monitoring Suppl (ACCU-CHEK GUIDE) w/Device KIT 1 kit by Other route 2 (two) times daily after a meal. 1 kit 0   Calcium Citrate-Vitamin D (CALCIUM CITRATE +D PO) Take 1 tablet by mouth daily.      cyanocobalamin 1000 MCG tablet Take 1,000 mcg by mouth daily.      cyclobenzaprine (FLEXERIL) 10 MG tablet Take 1 tablet (10 mg total) by mouth 3 (three) times daily as needed for muscle spasms. 60 tablet 0   fexofenadine (ALLEGRA) 180 MG tablet Take 1 tablet (180 mg total) by mouth daily. 90 tablet 2   glucose blood test strip 1 each by Other route 2 times daily at 12 noon and 4 pm. Use as instructed 2x a day - AccuChek 200 each 3   insulin glargine (LANTUS SOLOSTAR) 100 UNIT/ML Solostar Pen Inject 30 Units into the skin every morning. And pen needles 1/day (Patient taking differently: Inject 30 Units into the skin every morning. And pen needles 1/day/ Accuchek) 30 mL 3   Insulin Pen Needle (RELION PEN NEEDLES) 32G X 4 MM MISC USE DAILY 100 each 3   Ipratropium-Albuterol (COMBIVENT) 20-100 MCG/ACT AERS respimat Inhale 1 puff into the lungs 4 (four) times daily as needed for wheezing. 4 g 1    nitroGLYCERIN (NITROSTAT) 0.4 MG SL tablet PLACE 1 TABLET (0.4 MG TOTAL) UNDER THE TONGUE EVERY 5 (FIVE) MINUTES AS NEEDED FOR CHEST PAIN. 25 tablet 1   Vitamin E 400 units TABS Take 400 Units by mouth daily.     Zinc 100 MG TABS Take 100 mg by mouth daily.     [DISCONTINUED] atorvastatin (LIPITOR) 10 MG tablet Take 1 tablet (10 mg total) by mouth daily. (Patient not taking: Reported on 09/26/2020) 90 tablet 2   No current facility-administered medications on file prior to visit.    Past Medical History:  Diagnosis Date   Abnormal nuclear stress test 06/02/2019   Acute respiratory failure due to COVID-19 [facility]) 10/17/2020   Anemia    Angina pectoris (HCC) 06/02/2019   Formatting of this note might be different from the original. 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 Abnormal EGD 8/17   Headache 01/06/2021   Helicobacter pylori gastritis 07/21/2016   Medicare annual wellness visit, initial 01/05/2017   Formatting of this note might be different from the original. 3/18   Mixed dyslipidemia 01/12/2020   Neck pain 02/16/2020   Other chronic pain 09/02/2020   Penicillin allergy 07/21/2016   Pneumonia    Pneumonia due to COVID-19 virus 10/17/2020   Pulmonary nodule 02/28/2014   Formatting of this note might be different from the original. RML, no change   Right groin pain 06/16/2019    Skin cancer    Trigeminal neuralgia 07/26/2020   Type 2 diabetes mellitus without complication, without long-term current use of insulin (HCC) 04/11/2021   Allergies  Allergen Reactions   Aspirin     Takes ASA 81mg  at home.   Oxycodone Nausea Only    PERCOCET- weakness, dizziness, shaking, nausea   Penicillins Hives    Did it involve swelling of the face/tongue/throat, SOB, or low BP? No Did it involve sudden or severe rash/hives, skin peeling, or any reaction on the inside of your mouth or nose? Yes Did you need to seek medical attention at a hospital or doctor's office? Yes When did it last happen?      long time If all above answers are "NO", may proceed with cephalosporin use.    Percocet [Oxycodone-Acetaminophen]    Ciprofloxacin Rash   Prednisone Palpitations    Social History   Socioeconomic History   Marital status: Significant Other    Spouse name: Not on file   Number of children: 3   Years of education: 7   Highest education level: Not on file  Occupational History   Occupation: retired    Associate Professor: [employer]  Tobacco Use   Smoking status: Former    Current packs/day: 0.00    Average packs/day: 1.5 packs/day for 64.0 years (96.0 ttl pk-yrs)    Types: Cigarettes    Start date: 87    Quit date: 2007    Years since quitting: 17.6   Smokeless tobacco: Never  Vaping Use   Vaping status: Never Used  Substance and Sexual Activity   Alcohol use: No   Drug use: No   Sexual activity: Not on file  Other Topics Concern   Not on file  Social History Narrative   Lives with significant other in a 2 story home.  Has 3 children.  Retired.  Education: 7th grade.    Social Determinants of Health   Financial Resource Strain: Low Risk  (03/03/2023)   Overall Financial Resource Strain (CARDIA)    Difficulty of Paying Living Expenses: Not hard at all  Food Insecurity: No Food Insecurity (03/03/2023)   Hunger Vital Sign    Worried About Running Out of Food in  the Last Year: Never true    Ran Out of Food in the Last Year: Never true  Transportation Needs: No Transportation Needs (03/03/2023)   PRAPARE - Administrator, Civil Service (Medical): No    Lack of Transportation (Non-Medical): No  Physical Activity: Inactive (03/03/2023)   Exercise Vital Sign    Days of Exercise per Week: 0 days    Minutes of Exercise per Session: 0 min  Stress: No Stress Concern Present (03/03/2023)   [employer] of Occupational Health - Occupational Stress Questionnaire  Feeling of Stress : Not at all  Social Connections: Moderately Isolated (03/03/2023)   Social Connection and Isolation Panel [NHANES]    Frequency of Communication with Friends and Family: More than three times a week    Frequency of Social Gatherings with Friends and Family: More than three times a week    Attends Religious Services: Never    Database administrator or Organizations: No    Attends Banker Meetings: Never    Marital Status: Married    There were no vitals filed for this visit. There is no height or weight on file to calculate BMI.  Physical Exam  ASSESSMENT AND PLAN: There are no diagnoses linked to this encounter.  No follow-ups on file.  Abdulloh Ullom G. [place], MD  [facility]. [place] office.  Discharge Instructions   None

## 2023-06-16 ENCOUNTER — Ambulatory Visit (INDEPENDENT_AMBULATORY_CARE_PROVIDER_SITE_OTHER): Payer: Medicare HMO | Admitting: Family Medicine

## 2023-06-16 ENCOUNTER — Encounter: Payer: Self-pay | Admitting: Family Medicine

## 2023-06-16 VITALS — BP 135/70 | HR 62 | Temp 98.5°F | Resp 16 | Ht 68.4 in | Wt 173.5 lb

## 2023-06-16 DIAGNOSIS — R030 Elevated blood-pressure reading, without diagnosis of hypertension: Secondary | ICD-10-CM | POA: Diagnosis not present

## 2023-06-16 DIAGNOSIS — L298 Other pruritus: Secondary | ICD-10-CM | POA: Diagnosis not present

## 2023-06-16 MED ORDER — TRIAMCINOLONE ACETONIDE 0.1 % EX CREA
1.0000 | TOPICAL_CREAM | Freq: Two times a day (BID) | CUTANEOUS | 1 refills | Status: AC | PRN
Start: 2023-06-16 — End: ?

## 2023-06-16 NOTE — Patient Instructions (Addendum)
A few things to remember from today's visit:  Pruritic erythematous rash Triamcinolone cream 2 times daily, small amount for 2 weeks then as needed. Monitor blood pressure and pulse. Arrange appt with Dr. Clent Ridges in 2 months.  If you need refills for medications you take chronically, please call your pharmacy. Do not use My Chart to request refills or for acute issues that need immediate attention. If you send a my chart message, it may take a few days to be addressed, specially if I am not in the office.  Please be sure medication list is accurate. If a new problem present, please set up appointment sooner than planned today.

## 2023-06-17 ENCOUNTER — Encounter: Payer: Self-pay | Admitting: Internal Medicine

## 2023-06-17 ENCOUNTER — Ambulatory Visit: Payer: Medicare HMO | Admitting: Internal Medicine

## 2023-06-17 VITALS — BP 138/70 | HR 54 | Ht 68.4 in | Wt 174.6 lb

## 2023-06-17 DIAGNOSIS — E1159 Type 2 diabetes mellitus with other circulatory complications: Secondary | ICD-10-CM | POA: Diagnosis not present

## 2023-06-17 DIAGNOSIS — Z794 Long term (current) use of insulin: Secondary | ICD-10-CM | POA: Diagnosis not present

## 2023-06-17 DIAGNOSIS — Z7984 Long term (current) use of oral hypoglycemic drugs: Secondary | ICD-10-CM | POA: Diagnosis not present

## 2023-06-17 DIAGNOSIS — E782 Mixed hyperlipidemia: Secondary | ICD-10-CM | POA: Diagnosis not present

## 2023-06-17 DIAGNOSIS — E1165 Type 2 diabetes mellitus with hyperglycemia: Secondary | ICD-10-CM

## 2023-06-17 DIAGNOSIS — E119 Type 2 diabetes mellitus without complications: Secondary | ICD-10-CM

## 2023-06-17 NOTE — Progress Notes (Signed)
Patient ID: Bill [name], male   DOB: February 14, 1941, 82 y.o.   MRN: [id number]  HPI: Bill [name] is a 82 y.o.-year-old male, returning for follow-up for DM2, dx in 2019, insulin-dependent since 2022, uncontrolled, with complications (CAD, PAD). Pt. previously saw Dr. Everardo All, but last visit with me 4 months ago. He is here with his lady partner (they have been together for >9 years).  She helps him with his medications since patient had memory loss.  Interim history: No increased urination, blurry vision, nausea, chest pain. He still eats sweets but tries to have ice cream less frequently >> now once a week.  He bought an ice cream machine before last visit.   Reviewed HbA1c: Lab Results  Component Value Date   HGBA1C 7.1 (A) 02/01/2023   HGBA1C 6.7 (A) 10/01/2022   HGBA1C 10.5 (A) 06/23/2022   HGBA1C 7.8 (A) 12/11/2021   HGBA1C 6.9 (A) 08/27/2021   HGBA1C 7.2 (A) 06/24/2021   HGBA1C 7.8 (A) 06/11/2021   HGBA1C 16.8 Repeated and verified X2. (H) 04/11/2021   HGBA1C 7.9 (H) 10/18/2020   HGBA1C 7.4 (H) 04/16/2020   Previously on: - Lantus 60 >> 18 >> 30 units at bedtime Per Dr. George Hugh note, he was not started on multiple daily injections (NovoLog) due to memory loss. Metformin did not work for him in the past.   We changed to: - Lantus 30 units in am - >> too expensive -  >> could not get 2/2 price  Pt checks his sugars 1x a day and they are: - am: 130-250 >> 136-172 (after coffee) >> 125-205 >> smtms after coffee: 135-165, 173 - 2h after brunch: 167 >> n/c - before dinner: n/c - 2h after dinner: n/c - bedtime: n/c - nighttime: n/c >> 229, 277 >> n/c Lowest sugar was 120 >> 136 >> 125 >> 135; he has hypoglycemia awareness at 90.  Highest sugar was 400 - not recently >> 172 >> 277 > 173.  Glucometer: Accu-Chek guide  Pt's meals are: - Brunch: cereal (corn flakes) + whole milk; oatmeal with milk and butter - Dinner: meat + veggies/salad + starch (potato,  bisquit) - Snacks: "I have a huge sweet tooth" - chocolate, marshmallows, molasses, jelly, occasional regular sodas   - no CKD, last BUN/creatinine:  Lab Results  Component Value Date   BUN 9 05/26/2023   BUN 10 03/30/2023   CREATININE 0.86 05/26/2023   CREATININE 0.88 03/30/2023  He is not on ACE inhibitor/ARB.  -+ HL; last set of lipids: Lab Results  Component Value Date   CHOL 167 03/30/2023   HDL 45.20 03/30/2023   LDLCALC 99 03/30/2023   TRIG 118.0 03/30/2023   CHOLHDL 4 03/30/2023  Previously on Lipitor 10 mg daily >> off because of increased LFTs with statins in the past.  - last eye exam was on 08/11/2022: No DR. Records in Care everywhere.  - no numbness and tingling in his feet.  Last foot exam 06/24/2021.  He saw podiatry in the past.  He is on ASA 81.  He has a history of COPD, anemia, GERD, headache, neck pain, pulmonary nodule, myeloproliferative neoplasm. Also, he has LE varices - he had veins stripping in the past. He had an abnormal stress test.  ROS: + see HPI  Past Medical History:  Diagnosis Date   Abnormal nuclear stress test 06/02/2019   Acute respiratory failure due to COVID-19 [facility]) 10/17/2020   Anemia    Angina pectoris (HCC) 06/02/2019  Formatting of this note might be different from the original. 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 Abnormal EGD 8/17   Headache 01/06/2021   Helicobacter pylori gastritis 07/21/2016   Medicare annual wellness visit, initial 01/05/2017   Formatting of this note might be different from the original. 3/18   Mixed dyslipidemia 01/12/2020   Neck pain 02/16/2020   Other chronic pain 09/02/2020   Penicillin allergy 07/21/2016   Pneumonia    Pneumonia due to COVID-19 virus 10/17/2020   Pulmonary nodule 02/28/2014   Formatting of this note might be different from the original. RML, no change   Right groin pain 06/16/2019   Skin cancer    Trigeminal neuralgia 07/26/2020   Type 2 diabetes mellitus without complication, without long-term current use of insulin (HCC) 04/11/2021   Past Surgical History:  Procedure Laterality Date   APPENDECTOMY     back fusion     cataract surgery Left    CHOLECYSTECTOMY     COLONOSCOPY  2014   clear    LEFT HEART CATH AND CORONARY ANGIOGRAPHY N/A 06/09/2019   Procedure: LEFT HEART CATH AND CORONARY ANGIOGRAPHY;  Surgeon: Corky Crafts, MD;  Location: [facility];  Service: Cardiovascular;  Laterality: N/A;   SPINE SURGERY  1984   thoracic spine fusion    Social History   Socioeconomic History   Marital status: Significant Other    Spouse name: Not on file   Number of children: 3   Years of education: 7   Highest education level: Not on file  Occupational History   Occupation: retired    Associate Professor: [employer]  Tobacco Use   Smoking status: Former    Current packs/day: 0.00    Types: Cigarettes    Quit date: 2007    Years since quitting: 17.6   Smokeless tobacco: Never  Vaping Use   Vaping status: Never Used  Substance and Sexual Activity   Alcohol use: No   Drug use: No   Sexual activity: Not on file  Other Topics Concern   Not on file  Social History Narrative   Lives with significant other in a 2 story home.  Has 3 children.  Retired.  Education: 7th grade.    Social Determinants of Health   Financial Resource  Strain: Low Risk  (03/03/2023)   Overall Financial Resource Strain (CARDIA)    Difficulty of Paying Living Expenses: Not hard at all  Food Insecurity: No Food Insecurity (03/03/2023)   Hunger Vital Sign    Worried About Running Out of Food in the Last Year: Never true    Ran Out of Food in the Last Year: Never true  Transportation Needs: No Transportation Needs (03/03/2023)   PRAPARE - Administrator, Civil Service (Medical): No    Lack of Transportation (Non-Medical): No  Physical Activity: Inactive (03/03/2023)   Exercise Vital Sign  Days of Exercise per Week: 0 days    Minutes of Exercise per Session: 0 min  Stress: No Stress Concern Present (03/03/2023)   [employer] of Occupational Health - Occupational Stress Questionnaire    Feeling of Stress : Not at all  Social Connections: Moderately Isolated (03/03/2023)   Social Connection and Isolation Panel [NHANES]    Frequency of Communication with Friends and Family: More than three times a week    Frequency of Social Gatherings with Friends and Family: More than three times a week    Attends Religious Services: Never    Database administrator or Organizations: No    Attends Banker Meetings: Never    Marital Status: Married  Catering manager Violence: Not At Risk (03/03/2023)   Humiliation, Afraid, Rape, and Kick questionnaire    Fear of Current or Ex-Partner: No    Emotionally Abused: No    Physically Abused: No    Sexually Abused: No   Current Outpatient Medications on File Prior to Visit  Medication Sig Dispense Refill   Accu-Chek Softclix Lancets lancets 100 each by Other route 2 times daily at 12 noon and 4 pm. Use as instructed 100 each 12   acetaminophen (TYLENOL) 325 MG tablet Take 2 tablets (650 mg total) by mouth every 6 (six) hours as needed for mild pain (or Fever >/= 101).     Ascorbic Acid (VITAMIN C) 100 MG tablet Take 100 mg by mouth daily.     aspirin 81 MG tablet Take 81 mg by mouth daily.      bisoprolol (ZEBETA) 5 MG tablet TAKE 1/2 TABLET (2.5 MG) BY MOUTH EVERY MONDAY, WEDNESDAY AND FRIDAY. 135 tablet 3   Blood Glucose Monitoring Suppl (ACCU-CHEK GUIDE) w/Device KIT 1 kit by Other route 2 (two) times daily after a meal. 1 kit 0   Calcium Citrate-Vitamin D (CALCIUM CITRATE +D PO) Take 1 tablet by mouth daily.      cyanocobalamin 1000 MCG tablet Take 1,000 mcg by mouth daily.      cyclobenzaprine (FLEXERIL) 10 MG tablet Take 1 tablet (10 mg total) by mouth 3 (three) times daily as needed for muscle spasms. 60 tablet 0   fexofenadine (ALLEGRA) 180 MG tablet Take 1 tablet (180 mg total) by mouth daily. 90 tablet 2   glucose blood test strip 1 each by Other route 2 times daily at 12 noon and 4 pm. Use as instructed 2x a day - AccuChek 200 each 3   insulin glargine (LANTUS SOLOSTAR) 100 UNIT/ML Solostar Pen Inject 30 Units into the skin every morning. And pen needles 1/day (Patient taking differently: Inject 30 Units into the skin every morning. And pen needles 1/day/ Accuchek) 30 mL 3   Insulin Pen Needle (RELION PEN NEEDLES) 32G X 4 MM MISC USE DAILY 100 each 3   Ipratropium-Albuterol (COMBIVENT) 20-100 MCG/ACT AERS respimat Inhale 1 puff into the lungs 4 (four) times daily as needed for wheezing. 4 g 1   nitroGLYCERIN (NITROSTAT) 0.4 MG SL tablet PLACE 1 TABLET (0.4 MG TOTAL) UNDER THE TONGUE EVERY 5 (FIVE) MINUTES AS NEEDED FOR CHEST PAIN. 25 tablet 1   triamcinolone cream (KENALOG) 0.1 % Apply 1 Application topically 2 (two) times daily as needed. 30 g 1   Vitamin E 400 units TABS Take 400 Units by mouth daily.     Zinc 100 MG TABS Take 100 mg by mouth daily.     [DISCONTINUED] atorvastatin (LIPITOR) 10 MG tablet Take 1 tablet (  10 mg total) by mouth daily. (Patient not taking: Reported on 09/26/2020) 90 tablet 2   No current facility-administered medications on file prior to visit.   Allergies  Allergen Reactions   Aspirin     Takes ASA 81mg  at home.   Oxycodone Nausea Only     PERCOCET- weakness, dizziness, shaking, nausea   Penicillins Hives    Did it involve swelling of the face/tongue/throat, SOB, or low BP? No Did it involve sudden or severe rash/hives, skin peeling, or any reaction on the inside of your mouth or nose? Yes Did you need to seek medical attention at a hospital or doctor's office? Yes When did it last happen?      long time If all above answers are "NO", may proceed with cephalosporin use.    Percocet [Oxycodone-Acetaminophen]    Ciprofloxacin Rash   Prednisone Palpitations   Family History  Problem Relation Age of Onset   Colon cancer Mother    Diabetes Father    Heart disease Father    Stroke Father    Hypertension Father    Hyperlipidemia Father    Heart disease Brother    Pancreatic cancer Cousin        x 2   Prostate cancer Cousin    Heart disease Son    PE: BP 138/70   Pulse (!) 54   Ht 5' 8.4" (1.737 m)   Wt 174 lb 9.6 oz (79.2 kg)   SpO2 95%   BMI 26.24 kg/m  Wt Readings from Last 3 Encounters:  06/17/23 174 lb 9.6 oz (79.2 kg)  06/16/23 173 lb 8 oz (78.7 kg)  05/26/23 177 lb (80.3 kg)   Constitutional: Normal  weight, in NAD Eyes:  EOMI, no exophthalmos ENT: no neck masses, no cervical lymphadenopathy Cardiovascular: RRR, No MRG Respiratory: CTA B Musculoskeletal: no deformities Skin:no rashes Neurological: no tremor with outstretched hands Diabetic Foot Exam - Simple   Simple Foot Form Diabetic Foot exam was performed with the following findings: Yes 06/17/2023  2:48 PM  Visual Inspection See comments: Yes Sensation Testing Intact to touch and monofilament testing bilaterally: Yes Pulse Check See comments: Yes Comments Decreased dorsalis pedis pulse L foot (veins stripped in this leg before) Hallux valgus R Onychodystrophy B halluces    ASSESSMENT: 1. DM2, insulin-dependent, uncontrolled, without long-term complications  2. HL  PLAN:  1. Patient with longstanding, uncontrolled, type 2 diabetes,  on basal insulin only at last visit with worsening control in the previous year when HbA1c returned 10.5% in 05/2022, which sugars in the 400s.  I recommended mealtime insulin but he ended up not starting this and sugars improved. -At last visit, sugars were mostly above goal in the morning and they were significant only increasing towards the end of the day.  We discussed about potentially adding an SGLT2 inhibitor after discussion about mechanism of action, benefits, and possible side effects.  He agreed to start low-dose Jardiance.  I advised him to stay well-hydrated on the medication.  He ended up not starting a medication as this was expensive.  She let us know about this and we sent him a message back asking him whether he wanted to participate in the patient assistance program.  He did not reply to the message. -At today's visit, sugars appear to be above target in the morning and he is not checking later in the day.  I strongly advised him to check some sugars later in the day, also.  We also discussed  about trying again to add Jardiance and we gave him patient assistance forms for [employer].  Will continue the same dose of Lantus for now. - I suggested to:  Patient Instructions  Please continue: - Lantus 30 units in am  Try to start: - Jardiance 10 mg before breakfast  Please return in 4 months with your sugar log.   - HbA1c target for him is less than 7.4% (due to age and comorbidities). - we checked his HbA1c: 7.1% (stable) - advised to check sugars at different times of the day - 1x a day, rotating check times - advised for yearly eye exams >> he is UTD - return to clinic in 4 months   2. HL -Reviewed his LDL from 2024, this was under 100, the rest of the fractions at goal: Lab Results  Component Value Date   CHOL 167 03/30/2023   HDL 45.20 03/30/2023   LDLCALC 99 03/30/2023   TRIG 118.0 03/30/2023   CHOLHDL 4 03/30/2023  -He previously on Lipitor 10 mg daily but  stopped due to increased LFTs.  He did not restart it yet -Latest AST and ALT were not elevated: Lab Results  Component Value Date   ALT 32 05/26/2023   AST 27 05/26/2023   ALKPHOS 95 05/26/2023   BILITOT 0.6 05/26/2023   Carlus Pavlov, MD PhD [facility] Endocrinology

## 2023-06-17 NOTE — Patient Instructions (Addendum)
Please continue: - Lantus 30 units in am  Try to start: - Jardiance 10 mg before breakfast  Please return in 4 months with your sugar log.

## 2023-07-04 DIAGNOSIS — S61412A Laceration without foreign body of left hand, initial encounter: Secondary | ICD-10-CM | POA: Diagnosis not present

## 2023-07-05 DIAGNOSIS — S61412D Laceration without foreign body of left hand, subsequent encounter: Secondary | ICD-10-CM | POA: Diagnosis not present

## 2023-07-19 ENCOUNTER — Other Ambulatory Visit: Payer: Self-pay | Admitting: Internal Medicine

## 2023-09-14 ENCOUNTER — Encounter: Payer: Self-pay | Admitting: Family Medicine

## 2023-09-14 ENCOUNTER — Ambulatory Visit (INDEPENDENT_AMBULATORY_CARE_PROVIDER_SITE_OTHER): Payer: Medicare HMO | Admitting: Family Medicine

## 2023-09-14 VITALS — BP 124/76 | HR 58 | Temp 98.5°F | Wt 170.2 lb

## 2023-09-14 DIAGNOSIS — S60459A Superficial foreign body of unspecified finger, initial encounter: Secondary | ICD-10-CM | POA: Diagnosis not present

## 2023-09-14 DIAGNOSIS — R519 Headache, unspecified: Secondary | ICD-10-CM | POA: Diagnosis not present

## 2023-09-14 DIAGNOSIS — J020 Streptococcal pharyngitis: Secondary | ICD-10-CM | POA: Diagnosis not present

## 2023-09-14 DIAGNOSIS — R0989 Other specified symptoms and signs involving the circulatory and respiratory systems: Secondary | ICD-10-CM | POA: Diagnosis not present

## 2023-09-14 DIAGNOSIS — R067 Sneezing: Secondary | ICD-10-CM | POA: Diagnosis not present

## 2023-09-14 LAB — POC COVID19 BINAXNOW: SARS Coronavirus 2 Ag: NEGATIVE

## 2023-09-14 LAB — POCT INFLUENZA A/B
Influenza A, POC: NEGATIVE
Influenza B, POC: NEGATIVE

## 2023-09-14 LAB — POCT RAPID STREP A (OFFICE): Rapid Strep A Screen: POSITIVE — AB

## 2023-09-14 MED ORDER — CEFUROXIME AXETIL 500 MG PO TABS: 500.0000 mg | ORAL_TABLET | Freq: Two times a day (BID) | ORAL | 0 refills | Status: AC

## 2023-09-14 NOTE — Addendum Note (Signed)
Addended by: Carola Rhine on: 09/14/2023 04:23 PM   Modules accepted: Orders

## 2023-09-14 NOTE — Progress Notes (Signed)
   Subjective:    Patient ID: Bill [name], male    DOB: 01-19-1941, 82 y.o.   MRN: [id number]  HPI Here for 2 issues. First 3 weeks ago he developed a ST, fevers, and a dry cough. In addition, yesterday while working on a building he got s splinter in the right 5th finger.    Review of Systems  Constitutional:  Positive for fever.  HENT:  Positive for congestion and sore throat. Negative for ear pain and postnasal drip.   Eyes: Negative.   Respiratory:  Positive for cough. Negative for shortness of breath and wheezing.   Skin:  Positive for wound.       Objective:   Physical Exam Constitutional:      Appearance: Normal appearance. He is not ill-appearing.  HENT:     Right Ear: Tympanic membrane, ear canal and external ear normal.     Left Ear: Tympanic membrane, ear canal and external ear normal.     Nose: Nose normal.     Mouth/Throat:     Pharynx: Posterior oropharyngeal erythema present. No oropharyngeal exudate.  Eyes:     Conjunctiva/sclera: Conjunctivae normal.  Pulmonary:     Effort: Pulmonary effort is normal.     Breath sounds: Normal breath sounds.  Lymphadenopathy:     Cervical: No cervical adenopathy.  Skin:    Comments: There is a tiny metal splinter on the right fifth finger on the flexor side   Neurological:     Mental Status: He is alert.           Assessment & Plan:  He has a strep pharyngitis, and we will treat this with 10 days of Cefuroxoime. Also we were able to pull the splinter out with forceps.  Gershon Crane, MD

## 2023-10-11 ENCOUNTER — Ambulatory Visit: Payer: Medicare HMO | Admitting: Internal Medicine

## 2023-10-11 ENCOUNTER — Encounter: Payer: Self-pay | Admitting: Internal Medicine

## 2023-10-11 VITALS — BP 116/60 | HR 59 | Ht 68.4 in | Wt 172.2 lb

## 2023-10-11 DIAGNOSIS — E1165 Type 2 diabetes mellitus with hyperglycemia: Secondary | ICD-10-CM

## 2023-10-11 DIAGNOSIS — Z794 Long term (current) use of insulin: Secondary | ICD-10-CM

## 2023-10-11 DIAGNOSIS — E782 Mixed hyperlipidemia: Secondary | ICD-10-CM

## 2023-10-11 DIAGNOSIS — E1159 Type 2 diabetes mellitus with other circulatory complications: Secondary | ICD-10-CM | POA: Diagnosis not present

## 2023-10-11 DIAGNOSIS — Z7984 Long term (current) use of oral hypoglycemic drugs: Secondary | ICD-10-CM | POA: Diagnosis not present

## 2023-10-11 LAB — POCT GLYCOSYLATED HEMOGLOBIN (HGB A1C): Hemoglobin A1C: 7.4 % — AB (ref 4.0–5.6)

## 2023-10-11 MED ORDER — REPAGLINIDE 0.5 MG PO TABS: 0.5000 mg | ORAL_TABLET | Freq: Two times a day (BID) | ORAL | 3 refills | Status: DC

## 2023-10-11 NOTE — Progress Notes (Signed)
Patient ID: Bill [name], male   DOB: 18-Feb-1941, 82 y.o.   MRN: [id number]  HPI: Bill [name] is a 81 y.o.-year-old male, returning for follow-up for DM2, dx in 2019, insulin-dependent since 2022, uncontrolled, with complications (CAD, PAD). Pt. previously saw Dr. Everardo All, but last visit with me 4 months ago. He is here with his lady partner (they have been together for >9 years).  She helps him with his medications since patient had memory loss.  Interim history: No increased urination, blurry vision, nausea, chest pain. He still eats sweets.  Sugars are higher in the morning after eating these at night. His partner also mentions a rash on his chest, abdomen and back.  Upon questioning, it has been present for approximately a year.  No new medications or new detergents.  Reviewed HbA1c: 06/17/2023: HbA1c 7.1% Lab Results  Component Value Date   HGBA1C 7.1 (A) 02/01/2023   HGBA1C 6.7 (A) 10/01/2022   HGBA1C 10.5 (A) 06/23/2022   HGBA1C 7.8 (A) 12/11/2021   HGBA1C 6.9 (A) 08/27/2021   HGBA1C 7.2 (A) 06/24/2021   HGBA1C 7.8 (A) 06/11/2021   HGBA1C 16.8 Repeated and verified X2. (H) 04/11/2021   HGBA1C 7.9 (H) 10/18/2020   HGBA1C 7.4 (H) 04/16/2020   Previously on: - Lantus 60 >> 18 >> 30 units at bedtime Per Dr. George Hugh note, he was not started on multiple daily injections (NovoLog) due to memory loss. Metformin did not work for him in the past.   We changed to: - Lantus 30 units in am - Jardiance 10 mg before b'fast >> too expensive >> PAP >> decided not to use it 2/2 fear of SE (her partner's) -  >> could not get 2/2 price  Pt checks his sugars 1x a day and they are: - am: 130-250 >> 136-172 (after coffee) >> 125-205 >> smtms after coffee: 135-165, 173 >> before eating, but smtms after coffee: 104-161 - 2h after brunch: 167 >> n/c >> 137-177, 187 - before dinner: n/c - 2h after dinner: n/c >> 175 - bedtime: n/c - nighttime: n/c >> 229, 277 >> n/c Lowest sugar  was  135 >> 104; he has hypoglycemia awareness at 90.  Highest sugar was 400 >> 172 >> 277 > 173 >> 187.  Glucometer: Accu-Chek guide  Pt's meals are: - Brunch: cereal (corn flakes) + whole milk; oatmeal with milk and butter - Dinner: meat + veggies/salad + starch (potato, bisquit) - Snacks: "I have a huge sweet tooth" - chocolate, marshmallows, molasses, jelly, occasional regular sodas   - no CKD, last BUN/creatinine:  Lab Results  Component Value Date   BUN 9 05/26/2023   BUN 10 03/30/2023   CREATININE 0.86 05/26/2023   CREATININE 0.88 03/30/2023  No results found for: "MICRALBCREAT" He is not on ACE inhibitor/ARB.  -+ HL; last set of lipids: Lab Results  Component Value Date   CHOL 167 03/30/2023   HDL 45.20 03/30/2023   LDLCALC 99 03/30/2023   TRIG 118.0 03/30/2023   CHOLHDL 4 03/30/2023  Previously on Lipitor 10 mg daily >> off because of increased LFTs with statins in the past.  - last eye exam was in 2024: No DR.   - no numbness and tingling in his feet.  Last foot exam   06/17/2023.  He saw podiatry in the past.  He is on ASA 81.  He has a history of COPD, anemia, GERD, headache, neck pain, pulmonary nodule, myeloproliferative neoplasm. Also, he has LE varices - he  had veins stripping in the past. He had an abnormal stress test.  ROS: + see HPI  Past Medical History:  Diagnosis Date   Abnormal nuclear stress test 06/02/2019   Acute respiratory failure due to COVID-19 (HCC) 10/17/2020   Anemia    Angina pectoris (HCC) 06/02/2019   Formatting of this note might be different from the original. 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 Abnormal EGD 8/17   Headache 01/06/2021   Helicobacter pylori gastritis 07/21/2016   Medicare annual wellness visit, initial 01/05/2017   Formatting of this note might be different from the original. 3/18   Mixed dyslipidemia 01/12/2020   Neck pain 02/16/2020   Other chronic pain 09/02/2020   Penicillin allergy 07/21/2016   Pneumonia    Pneumonia due to COVID-19 virus 10/17/2020   Pulmonary nodule 02/28/2014   Formatting of this note might be different from the original. RML, no change   Right groin pain 06/16/2019   Skin cancer    Trigeminal neuralgia 07/26/2020   Type 2 diabetes mellitus without complication, without long-term current use of insulin (HCC) 04/11/2021   Past Surgical History:  Procedure Laterality Date   APPENDECTOMY     back fusion     cataract surgery Left    CHOLECYSTECTOMY     COLONOSCOPY  2014   clear    LEFT HEART CATH AND CORONARY ANGIOGRAPHY N/A 06/09/2019   Procedure: LEFT HEART CATH AND CORONARY ANGIOGRAPHY;  Surgeon: Corky Crafts, MD;  Location: [facility];  Service: Cardiovascular;  Laterality: N/A;   SPINE SURGERY  1984   thoracic spine fusion    Social History   Socioeconomic History   Marital status: Significant Other    Spouse name: Not on file   Number of children: 3   Years of education: 7   Highest education level: Not on file  Occupational History   Occupation: retired    Associate Professor: [employer]  Tobacco Use   Smoking status: Former    Current packs/day: 0.00    Types: Cigarettes    Quit date: 2007    Years since quitting: 17.9   Smokeless tobacco: Never  Vaping Use   Vaping status: Never Used  Substance and Sexual Activity    Alcohol use: No   Drug use: No   Sexual activity: Not on file  Other Topics Concern   Not on file  Social History Narrative   Lives with significant other in a 2 story home.  Has 3 children.  Retired.  Education: 7th grade.    Social Drivers of Corporate investment banker Strain: Low Risk  (03/03/2023)   Overall Financial Resource Strain (CARDIA)    Difficulty of Paying Living Expenses: Not hard at all  Food Insecurity: No Food Insecurity (03/03/2023)   Hunger Vital Sign    Worried About Running Out of Food in the Last Year: Never true  Ran Out of Food in the Last Year: Never true  Transportation Needs: No Transportation Needs (03/03/2023)   PRAPARE - Administrator, Civil Service (Medical): No    Lack of Transportation (Non-Medical): No  Physical Activity: Inactive (03/03/2023)   Exercise Vital Sign    Days of Exercise per Week: 0 days    Minutes of Exercise per Session: 0 min  Stress: No Stress Concern Present (03/03/2023)   [employer] of Occupational Health - Occupational Stress Questionnaire    Feeling of Stress : Not at all  Social Connections: Moderately Isolated (03/03/2023)   Social Connection and Isolation Panel [NHANES]    Frequency of Communication with Friends and Family: More than three times a week    Frequency of Social Gatherings with Friends and Family: More than three times a week    Attends Religious Services: Never    Database administrator or Organizations: No    Attends Banker Meetings: Never    Marital Status: Married  Catering manager Violence: Not At Risk (03/03/2023)   Humiliation, Afraid, Rape, and Kick questionnaire    Fear of Current or Ex-Partner: No    Emotionally Abused: No    Physically Abused: No    Sexually Abused: No   Current Outpatient Medications on File Prior to Visit  Medication Sig Dispense Refill   Accu-Chek Softclix Lancets lancets 100 each by Other route 2 times daily at 12 noon and 4 pm. Use as instructed  100 each 12   acetaminophen (TYLENOL) 325 MG tablet Take 2 tablets (650 mg total) by mouth every 6 (six) hours as needed for mild pain (or Fever >/= 101).     Ascorbic Acid (VITAMIN C) 100 MG tablet Take 100 mg by mouth daily.     aspirin 81 MG tablet Take 81 mg by mouth daily.     bisoprolol (ZEBETA) 5 MG tablet TAKE 1/2 TABLET (2.5 MG) BY MOUTH EVERY MONDAY, WEDNESDAY AND FRIDAY. 135 tablet 3   Blood Glucose Monitoring Suppl (ACCU-CHEK GUIDE) w/Device KIT 1 kit by Other route 2 (two) times daily after a meal. 1 kit 0   Calcium Citrate-Vitamin D (CALCIUM CITRATE +D PO) Take 1 tablet by mouth daily.      cyanocobalamin 1000 MCG tablet Take 1,000 mcg by mouth daily.      cyclobenzaprine (FLEXERIL) 10 MG tablet Take 1 tablet (10 mg total) by mouth 3 (three) times daily as needed for muscle spasms. 60 tablet 0   DROPLET PEN NEEDLES 32G X 4 MM MISC USE ONE TIME DAILY 100 each 3   fexofenadine (ALLEGRA) 180 MG tablet Take 1 tablet (180 mg total) by mouth daily. 90 tablet 2   glucose blood test strip 1 each by Other route 2 times daily at 12 noon and 4 pm. Use as instructed 2x a day - AccuChek 200 each 3   insulin glargine (LANTUS SOLOSTAR) 100 UNIT/ML Solostar Pen Inject 30 Units into the skin every morning. And pen needles 1/day (Patient taking differently: Inject 30 Units into the skin every morning. And pen needles 1/day/ Accuchek) 30 mL 3   Ipratropium-Albuterol (COMBIVENT) 20-100 MCG/ACT AERS respimat Inhale 1 puff into the lungs 4 (four) times daily as needed for wheezing. 4 g 1   nitroGLYCERIN (NITROSTAT) 0.4 MG SL tablet PLACE 1 TABLET (0.4 MG TOTAL) UNDER THE TONGUE EVERY 5 (FIVE) MINUTES AS NEEDED FOR CHEST PAIN. 25 tablet 1   triamcinolone cream (KENALOG) 0.1 % Apply 1 Application  topically 2 (two) times daily as needed. 30 g 1   Vitamin E 400 units TABS Take 400 Units by mouth daily.     Zinc 100 MG TABS Take 100 mg by mouth daily.     [DISCONTINUED] atorvastatin (LIPITOR) 10 MG tablet Take  1 tablet (10 mg total) by mouth daily. (Patient not taking: Reported on 09/26/2020) 90 tablet 2   No current facility-administered medications on file prior to visit.   Allergies  Allergen Reactions   Aspirin     Takes ASA 81mg  at home.   Oxycodone Nausea Only    PERCOCET- weakness, dizziness, shaking, nausea   Penicillins Hives    Did it involve swelling of the face/tongue/throat, SOB, or low BP? No Did it involve sudden or severe rash/hives, skin peeling, or any reaction on the inside of your mouth or nose? Yes Did you need to seek medical attention at a hospital or doctor's office? Yes When did it last happen?      long time If all above answers are "NO", may proceed with cephalosporin use.    Percocet [Oxycodone-Acetaminophen]    Ciprofloxacin Rash   Prednisone Palpitations   Family History  Problem Relation Age of Onset   Colon cancer Mother    Diabetes Father    Heart disease Father    Stroke Father    Hypertension Father    Hyperlipidemia Father    Heart disease Brother    Pancreatic cancer Cousin        x 2   Prostate cancer Cousin    Heart disease Son    PE: Pulse (!) 59   Ht 5' 8.4" (1.737 m)   Wt 172 lb 3.2 oz (78.1 kg)   SpO2 94%   BMI 25.88 kg/m  Wt Readings from Last 3 Encounters:  10/11/23 172 lb 3.2 oz (78.1 kg)  09/14/23 170 lb 3.2 oz (77.2 kg)  06/17/23 174 lb 9.6 oz (79.2 kg)   Constitutional: Normal  weight, in NAD Eyes:  EOMI, no exophthalmos ENT: no neck masses, no cervical lymphadenopathy Cardiovascular: RRR, No MRG Respiratory: CTA B Musculoskeletal: no deformities Skin: + rash -mild, on anterior chest and abdomen and also posterior Neurological: no tremor with outstretched hands  ASSESSMENT: 1. DM2, insulin-dependent, uncontrolled, without long-term complications  2. HL  PLAN:  1. Patient with longstanding, uncontrolled, type 2 diabetes, on basal insulin only at last visit, to which I recommended to add Jardiance after discussion  about the mechanism of action, benefits, and possible side effects.  I did advise him to stay well-hydrated on the medication.  We continued Lantus at the same dose. -At last visit, HbA1c was 7.1%, stable, at goal for him.  Sugars appears to be above target in the morning and he was not checking later in the day.  I strongly advised him to try to start. -At today's visit, he still mostly checks blood sugars midday and they are at or above goal, but upon questioning, he is still checking these occasionally after coffee.  I advised him to check this fasting and to also check some sugars later in the day including in the evening. -Upon questioning, his partner tells me that they decided not to start Jardiance due to possible side effects.  We discussed that these are rare and the benefits outweigh the risk but they would still want to continue without it.  In that case, we discussed about possibly starting Prandin before his main meals at a very low dose,  and they agree with the plan.  Will continue Lantus for now. - I suggested to:  Patient Instructions  Please continue: - Lantus 30 units in am  Try to start: - Repaglinide 0.5 mg before the main meals  Please return in 4 months with your sugar log.   - HbA1c target for him is less than 7.4% (due to age and comorbidities). - we checked his HbA1c: 7.4% (higher) - advised to check sugars at different times of the day - 1x a day, rotating check times - advised for yearly eye exams >> he is UTD but records are not available - will check ACR today -Did advise him to discuss with PCP about the rash.  This appears to be chronic, for approximately a year. - return to clinic in 4 months   2. HL -Reviewing his lipid panel from 2024: LDL was above target, otherwise fractions at goal: Lab Results  Component Value Date   CHOL 167 03/30/2023   HDL 45.20 03/30/2023   LDLCALC 99 03/30/2023   TRIG 118.0 03/30/2023   CHOLHDL 4 03/30/2023  -He previously  was on Lipitor 10 mg daily but stopped due to increased LFTs.  However, latest LFTs were normal: Lab Results  Component Value Date   ALT 32 05/26/2023   AST 27 05/26/2023   ALKPHOS 95 05/26/2023   BILITOT 0.6 05/26/2023   Carlus Pavlov, MD PhD [facility] Endocrinology

## 2023-10-11 NOTE — Addendum Note (Signed)
Addended by: Pollie Meyer on: 10/11/2023 04:04 PM   Modules accepted: Orders

## 2023-10-11 NOTE — Patient Instructions (Addendum)
Please continue: - Lantus 30 units in am  Try to start: - Repaglinide 0.5 mg before the main meals  Please return in 4 months with your sugar log.

## 2023-10-21 ENCOUNTER — Other Ambulatory Visit: Payer: Self-pay | Admitting: Internal Medicine

## 2023-10-21 DIAGNOSIS — E119 Type 2 diabetes mellitus without complications: Secondary | ICD-10-CM

## 2023-10-22 NOTE — Telephone Encounter (Signed)
Test strip  refill request complete

## 2023-11-14 ENCOUNTER — Ambulatory Visit (HOSPITAL_COMMUNITY)
Admission: EM | Admit: 2023-11-14 | Discharge: 2023-11-14 | Disposition: A | Discharge status: Home / Self Care | Payer: Medicare HMO | Attending: Internal Medicine | Admitting: Internal Medicine

## 2023-11-14 ENCOUNTER — Encounter (HOSPITAL_COMMUNITY): Payer: Self-pay

## 2023-11-14 DIAGNOSIS — J069 Acute upper respiratory infection, unspecified: Secondary | ICD-10-CM | POA: Diagnosis not present

## 2023-11-14 LAB — POC COVID19/FLU A&B COMBO
Covid Antigen, POC: NEGATIVE
Influenza A Antigen, POC: NEGATIVE
Influenza B Antigen, POC: NEGATIVE

## 2023-11-14 NOTE — ED Provider Notes (Signed)
[facility]    CSN: [id number] Arrival date & time: 11/14/23  1558      History   Chief Complaint Chief Complaint  Patient presents with   Cough    HPI Bill [name] is a 83 y.o. male.   83 year old male who presents to urgent care with complaints of cough, fatigue, fevers, chills, body aches and nasal drainage that started last night.  He reports that the body aches are severe.  He was concerned for flu and COVID and request to be tested.  He also relates that he has a family member that he is visiting that has cancer and he does not want to risk getting them sick.   Cough Associated symptoms: chills, fever and rhinorrhea   Associated symptoms: no chest pain, no ear pain, no rash, no shortness of breath and no sore throat     Past Medical History:  Diagnosis Date   Abnormal nuclear stress test 06/02/2019   Acute respiratory failure due to COVID-19 [facility]) 10/17/2020   Anemia    Angina pectoris (HCC) 06/02/2019   Formatting of this note might be different from the original. 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 Abnormal EGD 8/17   Headache 01/06/2021   Helicobacter pylori gastritis 07/21/2016   Medicare annual wellness visit, initial 01/05/2017   Formatting of this note might be different from the original. 3/18   Mixed dyslipidemia 01/12/2020   Neck  pain 02/16/2020   Other chronic pain 09/02/2020   Penicillin allergy 07/21/2016   Pneumonia    Pneumonia due to COVID-19 virus 10/17/2020   Pulmonary nodule 02/28/2014   Formatting of this note might be different from the original. 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   Hematospermia 05/18/2022   Poorly controlled type 2 diabetes mellitus with circulatory disorder (HCC) 04/11/2021   Headache 01/06/2021   Anemia    Anxiety    Cardiac arrhythmia    COPD (chronic obstructive pulmonary disease) (HCC)    Pneumonia    Skin cancer    Pneumonia due to COVID-19 virus 10/17/2020   Acute respiratory failure due to COVID-19 (HCC) 10/17/2020   COVID-19 virus infection 10/17/2020   Other chronic pain 09/02/2020   Chronic nonintractable headache 09/02/2020   Trigeminal neuralgia 07/26/2020   Body mass index (BMI) 26.0-26.9, adult 02/16/2020   Cervical spondylosis 02/16/2020   Elevated blood-pressure reading, without diagnosis of hypertension 02/16/2020   Neck pain 02/16/2020   Mixed dyslipidemia 01/12/2020   Elevated LFTs 01/12/2020   Right groin pain 06/16/2019   Abnormal nuclear stress test 06/02/2019   Angina pectoris (HCC) 06/02/2019   Frequent headaches 04/14/2018  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   Medicare annual wellness visit, initial 01/05/2017   Helicobacter pylori gastritis 07/21/2016   CAD (coronary artery disease) 07/21/2016   Penicillin allergy 07/21/2016   Gastro-esophageal reflux disease with esophagitis 02/28/2014   Pulmonary nodule 02/28/2014    Past Surgical History:  Procedure Laterality Date   APPENDECTOMY     back fusion     cataract surgery Left     CHOLECYSTECTOMY     COLONOSCOPY  2014   clear    LEFT HEART CATH AND CORONARY ANGIOGRAPHY N/A 06/09/2019   Procedure: LEFT HEART CATH AND CORONARY ANGIOGRAPHY;  Surgeon: Corky Crafts, MD;  Location: [facility] INVASIVE CV LAB;  Service: Cardiovascular;  Laterality: N/A;   SPINE SURGERY  1984   thoracic spine fusion        Home Medications    Prior to Admission medications   Medication Sig Start Date End Date Taking? Authorizing Provider  ACCU-CHEK GUIDE TEST test strip TEST BLOOD SUGAR 2 TIMES DAILY AT 12 NOON AND 4 PM. USE AS INSTRUCTED 10/22/23   Carlus Pavlov, MD  Accu-Chek Softclix Lancets lancets 100 each by Other route 2 times daily at 12 noon and 4 pm. Use as instructed 07/04/21   Romero Belling, MD  acetaminophen (TYLENOL) 325 MG tablet Take 2 tablets (650 mg total) by mouth every 6 (six) hours as needed for mild pain (or Fever >/= 101). 10/21/20   Elgergawy, Leana Roe, MD  Ascorbic Acid (VITAMIN C) 100 MG tablet Take 100 mg by mouth daily.    [provider]  aspirin 81 MG tablet Take 81 mg by mouth daily.    [provider]  bisoprolol (ZEBETA) 5 MG tablet TAKE 1/2 TABLET (2.5 MG) BY MOUTH EVERY MONDAY, WEDNESDAY AND FRIDAY. 03/15/23   Nelwyn Salisbury, MD  Blood Glucose Monitoring Suppl (ACCU-CHEK GUIDE) w/Device KIT 1 kit by Other route 2 (two) times daily after a meal. 07/04/21   Romero Belling, MD  Calcium Citrate-Vitamin D (CALCIUM CITRATE +D PO) Take 1 tablet by mouth daily.     [provider]  cyanocobalamin 1000 MCG tablet Take 1,000 mcg by mouth daily.     [provider]  DROPLET PEN NEEDLES 32G X 4 MM MISC USE ONE TIME DAILY 07/20/23   Carlus Pavlov, MD  fexofenadine (ALLEGRA) 180 MG tablet Take 1 tablet (180 mg total) by mouth daily. 08/01/20   Nelwyn Salisbury, MD  insulin glargine (LANTUS SOLOSTAR) 100 UNIT/ML Solostar Pen Inject 30 Units into the skin every morning. And pen needles 1/day Patient taking differently: Inject 30 Units  into the skin every morning. And pen needles 1/day/ Accuchek 10/01/22   Carlus Pavlov, MD  Ipratropium-Albuterol (COMBIVENT) 20-100 MCG/ACT AERS respimat Inhale 1 puff into the lungs 4 (four) times daily as needed for wheezing. 02/17/21   Virl Diamond A, MD  nitroGLYCERIN (NITROSTAT) 0.4 MG SL tablet PLACE 1 TABLET (0.4 MG TOTAL) UNDER THE TONGUE EVERY 5 (FIVE) MINUTES AS NEEDED FOR CHEST PAIN. 09/02/21   Nelwyn Salisbury, MD  repaglinide (PRANDIN) 0.5 MG tablet Take 1 tablet (0.5 mg total) by mouth 2 (two) times daily before a meal. 10/11/23   Carlus Pavlov, MD  triamcinolone cream (KENALOG) 0.1 % Apply 1 Application topically 2 (two) times daily as needed. 06/16/23   [place], Betty G, MD  Vitamin E 400 units TABS Take 400 Units by mouth daily.    [provider]  Zinc 100 MG TABS  Take 100 mg by mouth daily.    [provider]  atorvastatin (LIPITOR) 10 MG tablet Take 1 tablet (10 mg total) by mouth daily. Patient not taking: Reported on 09/26/2020 08/01/20 10/07/20  Nelwyn Salisbury, MD    Family History Family History  Problem Relation Age of Onset   Colon cancer Mother    Diabetes Father    Heart disease Father    Stroke Father    Hypertension Father    Hyperlipidemia Father    Heart disease Brother    Pancreatic cancer Cousin        x 2   Prostate cancer Cousin    Heart disease Son     Social History Social History   Tobacco Use   Smoking status: Former    Current packs/day: 0.00    Types: Cigarettes    Quit date: 2007    Years since quitting: 18.0   Smokeless tobacco: Never  Vaping Use   Vaping status: Never Used  Substance Use Topics   Alcohol use: No   Drug use: No     Allergies   Aspirin, Oxycodone, Penicillins, Percocet [oxycodone-acetaminophen], Ciprofloxacin, and Prednisone   Review of Systems Review of Systems  Constitutional:  Positive for chills and fever.  HENT:  Positive for rhinorrhea. Negative for ear pain and sore throat.    Eyes:  Negative for pain and visual disturbance.  Respiratory:  Positive for cough and chest tightness. Negative for shortness of breath.   Cardiovascular:  Negative for chest pain and palpitations.  Gastrointestinal:  Negative for abdominal pain and vomiting.  Genitourinary:  Negative for dysuria and hematuria.  Musculoskeletal:  Negative for arthralgias and back pain.       Body aches  Skin:  Negative for color change and rash.  Neurological:  Negative for seizures and syncope.  All other systems reviewed and are negative.    Physical Exam Triage Vital Signs ED Triage Vitals  Encounter Vitals Group     BP 11/14/23 1658 137/67     Systolic BP Percentile --      Diastolic BP Percentile --      Pulse Rate 11/14/23 1658 (!) 51     Resp 11/14/23 1658 16     Temp 11/14/23 1658 98.1 F (36.7 C)     Temp Source 11/14/23 1658 Oral     SpO2 11/14/23 1658 94 %     Weight 11/14/23 1655 172 lb (78 kg)     Height 11/14/23 1655 5\' 8"  (1.727 m)     Head Circumference --      Peak Flow --      Pain Score 11/14/23 1658 7     Pain Loc --      Pain Education --      Exclude from Growth Chart --    No data found.  Updated Vital Signs BP 137/67 (BP Location: Right Arm)   Pulse (!) 51   Temp 98.1 F (36.7 C) (Oral)   Resp 16   Ht 5\' 8"  (1.727 m)   Wt 172 lb (78 kg)   SpO2 94%   BMI 26.15 kg/m   Visual Acuity Right Eye Distance:   Left Eye Distance:   Bilateral Distance:    Right Eye Near:   Left Eye Near:    Bilateral Near:     Physical Exam Vitals and nursing note reviewed.  Constitutional:      General: He is not in acute distress.    Appearance:  He is well-developed.  HENT:     Head: Normocephalic and atraumatic.     Right Ear: Tympanic membrane normal.     Left Ear: Tympanic membrane normal.     Nose: Congestion present.     Mouth/Throat:     Mouth: Mucous membranes are moist.     Pharynx: Posterior oropharyngeal erythema (Very mild) present.  Eyes:      Conjunctiva/sclera: Conjunctivae normal.  Cardiovascular:     Rate and Rhythm: Normal rate and regular rhythm.     Heart sounds: No murmur heard. Pulmonary:     Effort: Pulmonary effort is normal. No respiratory distress.     Breath sounds: Normal breath sounds. No decreased breath sounds, wheezing or rhonchi.  Abdominal:     Palpations: Abdomen is soft.     Tenderness: There is no abdominal tenderness.  Musculoskeletal:        General: No swelling.     Cervical back: Neck supple.  Skin:    General: Skin is warm and dry.     Capillary Refill: Capillary refill takes less than 2 seconds.  Neurological:     Mental Status: He is alert.  Psychiatric:        Mood and Affect: Mood normal.      UC Treatments / Results  Labs (all labs ordered are listed, but only abnormal results are displayed) Labs Reviewed  POC COVID19/FLU A&B COMBO    EKG   Radiology No results found.  Procedures Procedures (including critical care time)  Medications Ordered in UC Medications - No data to display  Initial Impression / Assessment and Plan / UC Course  I have reviewed the triage vital signs and the nursing notes.  Pertinent labs & imaging results that were available during my care of the patient were reviewed by me and considered in my medical decision making (see chart for details).     Viral URI with cough   Flu A, flu B and COVID are all negative.  Symptoms are most likely secondary to a viral upper respiratory infection.  Reassuring signs are that your oxygen levels are normal and your lung fields are clear bilateral.  I did offer steroid injection as well as medication for cough but the patient would like to continue previous treatments at home.  At this point recommend continuing over-the-counter treatment and if symptoms worsen or fail to improve, return to urgent care for further evaluation.  Final Clinical Impressions(s) / UC Diagnoses   Final diagnoses:  None      Discharge Instructions          ED Prescriptions   None    PDMP not reviewed this encounter.   Landis Martins, [place] 11/14/23 1813

## 2023-11-14 NOTE — ED Triage Notes (Signed)
Patient here today with c/o cough, nasal drainage, fever, chills, fatigue, and body aches since last night. He has taken Tylenol and Coricidin with some relief. No known sick contacts.

## 2023-11-14 NOTE — Discharge Instructions (Addendum)
Flu A, flu B and COVID are all negative.  Symptoms are most likely secondary to a viral upper respiratory infection.  Reassuring signs are that your oxygen levels are normal and your lung fields are clear bilateral.  At this point recommend continuing over-the-counter treatment and if symptoms worsen or fail to improve, return to urgent care for further evaluation.

## 2023-11-17 ENCOUNTER — Ambulatory Visit: Payer: Self-pay | Admitting: Family Medicine

## 2023-11-17 ENCOUNTER — Ambulatory Visit (INDEPENDENT_AMBULATORY_CARE_PROVIDER_SITE_OTHER): Payer: Medicare HMO | Admitting: Family Medicine

## 2023-11-17 ENCOUNTER — Encounter: Payer: Self-pay | Admitting: Family Medicine

## 2023-11-17 VITALS — BP 138/82 | HR 99 | Temp 98.8°F | Ht 68.0 in | Wt 171.2 lb

## 2023-11-17 DIAGNOSIS — J441 Chronic obstructive pulmonary disease with (acute) exacerbation: Secondary | ICD-10-CM

## 2023-11-17 DIAGNOSIS — R051 Acute cough: Secondary | ICD-10-CM

## 2023-11-17 MED ORDER — BENZONATATE 100 MG PO CAPS: 100.0000 mg | ORAL_CAPSULE | Freq: Three times a day (TID) | ORAL | 0 refills | Status: DC | PRN

## 2023-11-17 MED ORDER — ALBUTEROL SULFATE HFA 108 (90 BASE) MCG/ACT IN AERS: 2.0000 | INHALATION_SPRAY | Freq: Four times a day (QID) | RESPIRATORY_TRACT | 0 refills | Status: AC | PRN

## 2023-11-17 MED ORDER — AZITHROMYCIN 250 MG PO TABS: ORAL_TABLET | ORAL | 0 refills | Status: AC

## 2023-11-17 NOTE — Patient Instructions (Signed)
I have sent in an albuterol inhaler for you to use 2 puffs every 4 hours as needed for wheezing.  I have sent in azithromycin for you to take.  Take 2 tablets today, then 1 tablet daily for the next 4 days.  I have sent in tessalon perles for you to take one capsule every 8 hours as needed for cough.  Follow-up with me for new or worsening symptoms.

## 2023-11-17 NOTE — Telephone Encounter (Signed)
  Chief Complaint: Cough, fever Symptoms: Worsening productive cough, fever, body aches Frequency: Ongoing x 3 days Pertinent Negatives: Patient denies SOB, CP Disposition: [] ED /[] Urgent Care (no appt availability in office) / [x] Appointment(In office/virtual)/ []  [facility]/ [] Home Care/ [] Refused Recommended Disposition /[] [facility]/ []  Follow-up with PCP Additional Notes: Pt's SO reports pt has been experiencing chills, body aches, fever, productive cough with green sputum x 3 days. She reports he was seen at [facility] prior to the fever developing and was advised it was viral and to treat at home with OTC. Pt has been taking Mucinex and Coricidin with little to no improvement. Pt denies SOB, CP. OV scheduled today. This RN educated on home care, when to call back, new-worsening symptoms. Pt's SO verbalized understanding and agrees to plan.   Copied from CRM [id number]. Topic: Clinical - Red Word Triage >> Nov 17, 2023  9:49 AM Florestine Avers wrote: Red Word that prompted transfer to Nurse Triage: Patient has a fever 100.6 since yesterday. Patient also has a really bad cough, with body aches and pains. He was seen prior at [place] urgent care for a virus, but was not given any medication. Reason for Disposition  Fever present > 3 days (72 hours)  Answer Assessment - Initial Assessment Questions 1. TEMPERATURE: "What is the most recent temperature?"  "How was it measured?"      100.6 this AM 2. ONSET: "When did the fever start?"      About 3 days ago, seen in UC, advised to take OTC as it was likely, viral. No fever when seen at UC 3. CHILLS: "Do you have chills?" If yes: "How bad are they?"  (e.g., none, mild, moderate, severe)   - NONE: no chills   - MILD: feeling cold   - MODERATE: feeling very cold, some shivering (feels better under a thick blanket)   - SEVERE: feeling extremely cold with shaking chills (general body shaking, rigors; even under a thick blanket)       Yes, Severe, under an electric blanket 4. OTHER SYMPTOMS: "Do you have any other symptoms besides the fever?"  (e.g., abdomen pain, cough, diarrhea, earache, headache, sore throat, urination pain)     Productive cough, green sputum, diarrhea, HA, sore throat 5. CAUSE: If there are no symptoms, ask: "What do you think is causing the fever?"      Unknown 6. CONTACTS: "Does anyone else in the family have an infection?"     Unknown 7. TREATMENT: "What have you done so far to treat this fever?" (e.g., medications)     OTC Coricidin, Mucinex  Protocols used: Fever-A-AH

## 2023-11-17 NOTE — Telephone Encounter (Signed)
Had appt today

## 2023-11-17 NOTE — Progress Notes (Signed)
Acute Office Visit  Subjective:     Patient ID: ZADE NORDHOFF, male    DOB: February 02, 1941, 83 y.o.   MRN: [id number]  Chief Complaint  Patient presents with   URI    Productive cough (yellow mucus, notes difficulty getting up phlegm), fever (this morning being 100.6), and fatigue for 4-5 days. Visited urgent care Monday where they performed covid and flu tests, both showing negative. There they also noted patient's lungs sounded clear. Patient has past history of COPD. Treating with mucus relief and tylenol    HPI Cough: Patient complains of fever and productive cough with sputum described as yellow and mucoid.  Symptoms began 5 days ago.  The cough is productive of green/yellow sputum, with shortness of breath, chest is painful during coughing, worsening over time and is aggravated by reclining position Associated symptoms include:fever, shortness of breath, and sputum production. Has hx COPD. Neg COVID and flu at Urgent Care on 11/14/23.    ROS Per HPI      Objective:    BP 138/82   Pulse 99   Temp 98.8 F (37.1 C)   Ht 5\' 8"  (1.727 m)   Wt 171 lb 3.2 oz (77.7 kg)   SpO2 95%   BMI 26.03 kg/m    Physical Exam Vitals and nursing note reviewed.  Constitutional:      General: He is not in acute distress. HENT:     Head: Normocephalic and atraumatic.     Mouth/Throat:     Mouth: Mucous membranes are moist.     Pharynx: Oropharynx is clear. No oropharyngeal exudate or posterior oropharyngeal erythema.  Eyes:     Extraocular Movements: Extraocular movements intact.  Cardiovascular:     Rate and Rhythm: Normal rate and regular rhythm.  Pulmonary:     Effort: Pulmonary effort is normal. No respiratory distress.     Breath sounds: Examination of the right-lower field reveals decreased breath sounds. Decreased breath sounds present. No wheezing, rhonchi or rales.  Musculoskeletal:     Cervical back: Normal range of motion.  Lymphadenopathy:     Cervical: No  cervical adenopathy.  Skin:    General: Skin is warm and dry.  Neurological:     General: No focal deficit present.     Mental Status: He is alert.    No results found for any visits on 11/17/23.      Assessment & Plan:  1. Acute cough (Primary)  - benzonatate (TESSALON) 100 MG capsule; Take 1 capsule (100 mg total) by mouth 3 (three) times daily as needed for cough.  Dispense: 20 capsule; Refill: 0 - albuterol (VENTOLIN HFA) 108 (90 Base) MCG/ACT inhaler; Inhale 2 puffs into the lungs every 6 (six) hours as needed for wheezing or shortness of breath.  Dispense: 8 g; Refill: 0  2. COPD exacerbation (HCC)  - azithromycin (ZITHROMAX) 250 MG tablet; Take 2 tablets on day 1, then 1 tablet daily on days 2 through 5  Dispense: 6 tablet; Refill: 0 - albuterol (VENTOLIN HFA) 108 (90 Base) MCG/ACT inhaler; Inhale 2 puffs into the lungs every 6 (six) hours as needed for wheezing or shortness of breath.  Dispense: 8 g; Refill: 0   Meds ordered this encounter  Medications   azithromycin (ZITHROMAX) 250 MG tablet    Sig: Take 2 tablets on day 1, then 1 tablet daily on days 2 through 5    Dispense:  6 tablet    Refill:  0   benzonatate (TESSALON)  100 MG capsule    Sig: Take 1 capsule (100 mg total) by mouth 3 (three) times daily as needed for cough.    Dispense:  20 capsule    Refill:  0   albuterol (VENTOLIN HFA) 108 (90 Base) MCG/ACT inhaler    Sig: Inhale 2 puffs into the lungs every 6 (six) hours as needed for wheezing or shortness of breath.    Dispense:  8 g    Refill:  0    Return if symptoms worsen or fail to improve.  Moshe Cipro, FNP

## 2023-11-23 ENCOUNTER — Other Ambulatory Visit: Payer: Self-pay | Admitting: Family Medicine

## 2023-11-23 DIAGNOSIS — J441 Chronic obstructive pulmonary disease with (acute) exacerbation: Secondary | ICD-10-CM

## 2024-01-24 ENCOUNTER — Other Ambulatory Visit: Payer: Self-pay | Admitting: Internal Medicine

## 2024-02-04 ENCOUNTER — Ambulatory Visit: Payer: Self-pay

## 2024-02-04 NOTE — Telephone Encounter (Signed)
 Copied from CRM [id number]. Topic: Appointments - Appointment Scheduling >> Feb 04, 2024  3:06 PM Caliyah H wrote: Patient reports ongoing swelling and discoloration in his leg and feet, stating the symptoms have progressed to the point where it hurts to walk.He shared that he had vein stripping in 1975, and has been following up with his PCP regarding these issues. However, he states the condition is now worsening. Callback number [phone]  Chief Complaint: pain to left foot and ankle Symptoms: purplish in color, moderate pain with walking long distances Frequency: ongoing- pt stated she made PCP aware - pt asking if he needs to go to [facility] Pertinent Negatives: Patient denies calf pain, SOB, chest pain Disposition: [] ED /[] Urgent Care (no appt availability in office) / [x] Appointment(In office/virtual)/ []  [facility]/ [] Home Care/ [] Refused Recommended Disposition /[] [facility]/ []  Follow-up with PCP Additional Notes: pt noted knots to his upper thigh area- discussed blood clots and concern that may be the cause. Pt stated he had both veins stripped in 1975 and had no problems until the last few days    Reason for Disposition  [1] Thigh, calf, or ankle swelling AND [2] only 1 side  Answer Assessment - Initial Assessment Questions 1. ONSET: "When did the swelling start?" (e.g., minutes, hours, days)     Ongoing  2. LOCATION: "What part of the leg is swollen?"  "Are both legs swollen or just one leg?"     Left foot and ankle  3. SEVERITY: "How bad is the swelling?" (e.g., localized; mild, moderate, severe)   - Localized: Small area of swelling localized to one leg.   - MILD pedal edema: Swelling limited to foot and ankle, pitting edema < 1/4 inch (6 mm) deep, rest and elevation eliminate most or all swelling.   - MODERATE edema: Swelling of lower leg to knee, pitting edema > 1/4 inch (6 mm) deep, rest and elevation only partially reduce swelling.   - SEVERE edema:  Swelling extends above knee, facial or hand swelling present.      mild 4. REDNESS: "Does the swelling look red or infected?"     Purplish looks bruised foot gets cold easy 5. PAIN: "Is the swelling painful to touch?" If Yes, ask: "How painful is it?"   (Scale 1-10; mild, moderate or severe)     moderate 6. FEVER: "Do you have a fever?" If Yes, ask: "What is it, how was it measured, and when did it start?"      no 7. CAUSE: "What do you think is causing the leg swelling?"     Circulation- up on feet yesterday   9. RECURRENT SYMPTOM: "Have you had leg swelling before?" If Yes, ask: "When was the last time?" "What happened that time?"     yes 10. OTHER SYMPTOMS: "Do you have any other symptoms?" (e.g., chest pain, difficulty breathing)       Kots in upper legs left foot different color than right (PCP knows)  Protocols used: Leg Swelling and Edema-A-AH

## 2024-02-04 NOTE — Telephone Encounter (Signed)
 This RN made first attempt to triage patient. No answer, left a message. Routing for additional attempts.   Copied from CRM [id number]. Topic: Appointments - Appointment Scheduling >> Feb 04, 2024  3:06 PM Caliyah H wrote: Patient reports ongoing swelling and discoloration in his leg and feet, stating the symptoms have progressed to the point where it hurts to walk.He shared that he had vein stripping in 1975, and has been following up with his PCP regarding these issues. However, he states the condition is now worsening. Callback number [phone]

## 2024-02-07 ENCOUNTER — Encounter: Payer: Self-pay | Admitting: Internal Medicine

## 2024-02-07 ENCOUNTER — Ambulatory Visit: Admitting: Internal Medicine

## 2024-02-07 VITALS — BP 128/70 | HR 65 | Ht 68.0 in | Wt 174.6 lb

## 2024-02-07 DIAGNOSIS — Z7984 Long term (current) use of oral hypoglycemic drugs: Secondary | ICD-10-CM | POA: Diagnosis not present

## 2024-02-07 DIAGNOSIS — E1165 Type 2 diabetes mellitus with hyperglycemia: Secondary | ICD-10-CM | POA: Diagnosis not present

## 2024-02-07 DIAGNOSIS — E782 Mixed hyperlipidemia: Secondary | ICD-10-CM

## 2024-02-07 DIAGNOSIS — Z794 Long term (current) use of insulin: Secondary | ICD-10-CM | POA: Diagnosis not present

## 2024-02-07 DIAGNOSIS — E1159 Type 2 diabetes mellitus with other circulatory complications: Secondary | ICD-10-CM

## 2024-02-07 LAB — POCT GLYCOSYLATED HEMOGLOBIN (HGB A1C): Hemoglobin A1C: 6.9 % — AB (ref 4.0–5.6)

## 2024-02-07 LAB — GLUCOSE, POCT (MANUAL RESULT ENTRY): POC Glucose: 254 mg/dL — AB (ref 70–99)

## 2024-02-07 MED ORDER — EMPAGLIFLOZIN 10 MG PO TABS: 10.0000 mg | ORAL_TABLET | Freq: Every day | ORAL | 3 refills | Status: AC

## 2024-02-07 NOTE — Progress Notes (Signed)
 Patient ID: RENALD [name], male   DOB: [birth year], 83 y.o.   MRN: [id number]  HPI: Bill [name] is a 83 y.o.-year-old male, returning for follow-up for DM2, dx in 2019, insulin-dependent since 2022, uncontrolled, with complications (CAD, PAD). Pt. previously saw Dr. Washington Hacker, but last visit with me 4 months ago. He is here with his lady partner.  She helps him with his medications since patient had memory loss.  Interim history: No increased urination, blurry vision, nausea, chest pain. He has pain in L leg - has an appt with Dr. Alyne Babinski tomorrow.  He had significant varicosities and had veins stripped in 1975.  Reviewed HbA1c: Lab Results  Component Value Date   HGBA1C 7.4 (A) 10/11/2023   HGBA1C 7.1 (A) 02/01/2023   HGBA1C 6.7 (A) 10/01/2022   HGBA1C 10.5 (A) 06/23/2022   HGBA1C 7.8 (A) 12/11/2021   HGBA1C 6.9 (A) 08/27/2021   HGBA1C 7.2 (A) 06/24/2021   HGBA1C 7.8 (A) 06/11/2021   HGBA1C 16.8 Repeated and verified X2. (H) 04/11/2021   HGBA1C 7.9 (H) 10/18/2020  06/17/2023: HbA1c 7.1%  Previously on: - Lantus 60 >> 18 >> 30 units at bedtime Per Dr. Jacqualyn Mates note, he was not started on multiple daily injections (NovoLog) due to memory loss. Metformin did not work for him in the past.   We changed to: - Lantus 30 units in am -  0.5 mg before the main meals-added 09/2023 >> stopped 2/2 stomach pain He was previously on Jardiance 10 mg before b'fast >>  PAP >> decided not to use it 2/2 fear of SE (her partner's) He was also previously on NovoLog 6-10 units 15 min before the 2 meals of the day >> stopped before our visit in 09/2023 2/2 price  Pt checks his sugars 1x a day and they are: - am: 135-165, 173 >> before eating, but smtms after coffee: 104-161 >> 118-141, 189 - 2h after brunch: 167 >> n/c >> 137-177, 187 >> n/c >> 254 - before dinner: n/c - 2h after dinner: n/c >> 175 >> n/c  - bedtime: n/c - nighttime: n/c >> 229, 277 >> n/c Lowest sugar was  135 >> 104 >> 118;  he has hypoglycemia awareness at 90.  Highest sugar was 400 >> .Aaron Aas.173 >> 187 >> 254.  Glucometer: Accu-Chek guide  Pt's meals are: - Brunch: cereal (corn flakes) + whole milk; oatmeal with milk and butter - Dinner: meat + veggies/salad + starch (potato, bisquit) - Snacks: "I have a huge sweet tooth" - chocolate, marshmallows, molasses, jelly, occasional regular sodas   - no CKD, last BUN/creatinine:  Lab Results  Component Value Date   BUN 9 05/26/2023   BUN 10 03/30/2023   CREATININE 0.86 05/26/2023   CREATININE 0.88 03/30/2023  No results found for: "MICRALBCREAT" He is not on ACE inhibitor/ARB.  -+ HL; last set of lipids: Lab Results  Component Value Date   CHOL 167 03/30/2023   HDL 45.20 03/30/2023   LDLCALC 99 03/30/2023   TRIG 118.0 03/30/2023   CHOLHDL 4 03/30/2023  Previously on Lipitor 10 mg daily >> off because of increased LFTs with statins in the past.  - last eye exam was in 2024: No DR.   - no numbness and tingling in his feet.  Last foot exam   06/17/2023.  He saw podiatry in the past.  He is on ASA 81.  He has a history of COPD, anemia, GERD, headache, neck pain, pulmonary nodule, myeloproliferative neoplasm. Also, he has  LE varices - he had veins stripping in the past. He had an abnormal stress test.  ROS: + see HPI  Past Medical History:  Diagnosis Date   Abnormal nuclear stress test 06/02/2019   Acute respiratory failure due to COVID-19 (HCC) 10/17/2020   Anemia    Angina pectoris (HCC) 06/02/2019   Formatting of this note might be different from the original. 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 Abnormal EGD 8/17   Headache 01/06/2021   Helicobacter pylori gastritis 07/21/2016   Medicare annual wellness visit, initial 01/05/2017   Formatting of this note might be different from the original. 3/18   Mixed dyslipidemia 01/12/2020   Neck pain 02/16/2020   Other chronic pain 09/02/2020   Penicillin allergy 07/21/2016   Pneumonia    Pneumonia due to COVID-19 virus 10/17/2020   Pulmonary nodule 02/28/2014   Formatting of this note might be different from the original. RML, no change   Right groin pain 06/16/2019   Skin cancer    Trigeminal neuralgia 07/26/2020   Type 2 diabetes mellitus without complication, without long-term current use of insulin (HCC) 04/11/2021   Past Surgical History:  Procedure Laterality Date   APPENDECTOMY     back fusion     cataract surgery Left    CHOLECYSTECTOMY     COLONOSCOPY  2014   clear    LEFT HEART CATH AND CORONARY ANGIOGRAPHY N/A 06/09/2019   Procedure: LEFT HEART CATH AND CORONARY ANGIOGRAPHY;  Surgeon: Lucendia Rusk, MD;  Location: [facility];  Service: Cardiovascular;  Laterality: N/A;   SPINE SURGERY  1984   thoracic spine fusion    Social History   Socioeconomic History   Marital status: Significant Other    Spouse name: Not on file   Number of children: 3   Years of education: 7   Highest education level: Not on file  Occupational History   Occupation: retired    Associate Professor: [employer]  Tobacco Use   Smoking status: Former    Current packs/day: 0.00    Types: Cigarettes    Quit date: 2007    Years since quitting: 18.2   Smokeless tobacco: Never  Vaping Use   Vaping status: Never Used  Substance and Sexual Activity   Alcohol use: No    Drug use: No   Sexual activity: Not on file  Other Topics Concern   Not on file  Social History Narrative   Lives with significant other in a 2 story home.  Has 3 children.  Retired.  Education: 7th grade.    Social Drivers of Corporate investment banker Strain: Low Risk  (03/03/2023)   Overall Financial Resource Strain (CARDIA)    Difficulty of Paying Living Expenses: Not hard at all  Food Insecurity: No Food Insecurity (03/03/2023)   Hunger Vital Sign    Worried About Running Out of Food in the Last  Year: Never true    Ran Out of Food in the Last Year: Never true  Transportation Needs: No Transportation Needs (03/03/2023)   PRAPARE - Administrator, Civil Service (Medical): No    Lack of Transportation (Non-Medical): No  Physical Activity: Inactive (03/03/2023)   Exercise Vital Sign    Days of Exercise per Week: 0 days    Minutes of Exercise per Session: 0 min  Stress: No Stress Concern Present (03/03/2023)   [employer] of Occupational Health - Occupational Stress Questionnaire    Feeling of Stress : Not at all  Social Connections: Moderately Isolated (03/03/2023)   Social Connection and Isolation Panel [NHANES]    Frequency of Communication with Friends and Family: More than three times a week    Frequency of Social Gatherings with Friends and Family: More than three times a week    Attends Religious Services: Never    Database administrator or Organizations: No    Attends Banker Meetings: Never    Marital Status: Married  Catering manager Violence: Not At Risk (03/03/2023)   Humiliation, Afraid, Rape, and Kick questionnaire    Fear of Current or Ex-Partner: No    Emotionally Abused: No    Physically Abused: No    Sexually Abused: No   Current Outpatient Medications on File Prior to Visit  Medication Sig Dispense Refill   ACCU-CHEK GUIDE TEST test strip TEST BLOOD SUGAR 2 TIMES DAILY AT 12 NOON AND 4 PM. USE AS INSTRUCTED 200 strip 3   Accu-Chek  Softclix Lancets lancets 100 each by Other route 2 times daily at 12 noon and 4 pm. Use as instructed 100 each 12   acetaminophen (TYLENOL) 325 MG tablet Take 2 tablets (650 mg total) by mouth every 6 (six) hours as needed for mild pain (or Fever >/= 101).     albuterol (VENTOLIN HFA) 108 (90 Base) MCG/ACT inhaler Inhale 2 puffs into the lungs every 6 (six) hours as needed for wheezing or shortness of breath. 8 g 0   Ascorbic Acid (VITAMIN C) 100 MG tablet Take 100 mg by mouth daily.     aspirin 81 MG tablet Take 81 mg by mouth daily.     benzonatate (TESSALON) 100 MG capsule Take 1 capsule (100 mg total) by mouth 3 (three) times daily as needed for cough. 20 capsule 0   bisoprolol (ZEBETA) 5 MG tablet TAKE 1/2 TABLET (2.5 MG) BY MOUTH EVERY MONDAY, WEDNESDAY AND FRIDAY. 135 tablet 3   Blood Glucose Monitoring Suppl (ACCU-CHEK GUIDE) w/Device KIT 1 kit by Other route 2 (two) times daily after a meal. 1 kit 0   Calcium Citrate-Vitamin D (CALCIUM CITRATE +D PO) Take 1 tablet by mouth daily.      cyanocobalamin 1000 MCG tablet Take 1,000 mcg by mouth daily.      DROPLET PEN NEEDLES 32G X 4 MM MISC USE ONE TIME DAILY 100 each 3   fexofenadine (ALLEGRA) 180 MG tablet Take 1 tablet (180 mg total) by mouth daily. 90 tablet 2   insulin glargine (LANTUS SOLOSTAR) 100 UNIT/ML Solostar Pen INJECT 30 UNITS INTO THE SKIN EVERY MORNING. 30 mL 2   nitroGLYCERIN (NITROSTAT) 0.4 MG SL tablet PLACE 1 TABLET (0.4 MG TOTAL) UNDER THE TONGUE EVERY 5 (FIVE) MINUTES AS NEEDED FOR CHEST PAIN. 25 tablet 1   repaglinide (PRANDIN) 0.5 MG tablet Take 1 tablet (0.5 mg total) by mouth 2 (two) times daily before a meal. 180 tablet 3  triamcinolone cream (KENALOG) 0.1 % Apply 1 Application topically 2 (two) times daily as needed. 30 g 1   Vitamin E 400 units TABS Take 400 Units by mouth daily.     Zinc 100 MG TABS Take 100 mg by mouth daily.     [DISCONTINUED] atorvastatin (LIPITOR) 10 MG tablet Take 1 tablet (10 mg total) by  mouth daily. (Patient not taking: Reported on 09/26/2020) 90 tablet 2   No current facility-administered medications on file prior to visit.   Allergies  Allergen Reactions   Aspirin     Takes ASA 81mg  at home.   Oxycodone Nausea Only    PERCOCET- weakness, dizziness, shaking, nausea   Penicillins Hives    Did it involve swelling of the face/tongue/throat, SOB, or low BP? No Did it involve sudden or severe rash/hives, skin peeling, or any reaction on the inside of your mouth or nose? Yes Did you need to seek medical attention at a hospital or doctor's office? Yes When did it last happen?      long time If all above answers are "NO", may proceed with cephalosporin use.    Percocet [Oxycodone-Acetaminophen]    Ciprofloxacin Rash   Prednisone Palpitations   Family History  Problem Relation Age of Onset   Colon cancer Mother    Diabetes Father    Heart disease Father    Stroke Father    Hypertension Father    Hyperlipidemia Father    Heart disease Brother    Pancreatic cancer Cousin        x 2   Prostate cancer Cousin    Heart disease Son    PE: BP 128/70   Pulse 65   Ht 5\' 8"  (1.727 m)   Wt 174 lb 9.6 oz (79.2 kg)   SpO2 93%   BMI 26.55 kg/m  Wt Readings from Last 3 Encounters:  02/07/24 174 lb 9.6 oz (79.2 kg)  11/17/23 171 lb 3.2 oz (77.7 kg)  11/14/23 172 lb (78 kg)   Constitutional: Normal  weight, in NAD Eyes:  EOMI, no exophthalmos ENT: no neck masses, no cervical lymphadenopathy Cardiovascular: RRR, No MRG Respiratory: CTA B Musculoskeletal: no deformities Skin: no rash, + significant varicosities in left leg Neurological: no tremor with outstretched hands Diabetic Foot Exam - Simple   Simple Foot Form Diabetic Foot exam was performed with the following findings: Yes 02/07/2024  3:34 PM  Visual Inspection No deformities, no ulcerations, no other skin breakdown bilaterally: Yes Sensation Testing Intact to touch and monofilament testing bilaterally:  Yes Pulse Check See comments: Yes Comments Hallux valgus R foot Varicose veins L foot Decreased pedal pulses B Onychodystrophy B Halluces    ASSESSMENT: 1. DM2, insulin-dependent, uncontrolled, without long-term complications  2. HL  PLAN:  1. Patient with longstanding, uncontrolled, type 2 diabetes, on basal insulin only again, to which I previously recommended Jardiance (he decided not to start this due to possible side effects).  At last visit we discussed about adding repaglinide before the main meals, at a very low dose (he tried this but had an upset stomach and she stopped).  At today's visit, he continues only on Lantus.  He mainly check sugars in the morning but at today's visit he is approximately 5 hours after brunch with a blood sugar checked in the office 254.  I again recommended to try to add Jardiance low-dose.  They agree to try it.  I advised him to stay very well-hydrated.  We discussed about the  many benefits of the medication. - I suggested to:  Patient Instructions  Please continue: - Lantus 30 units in am  Try to start: - Jardiance 10 mg before b'fast  STAY HYDRATED!  Please return in 3-4 months.  - we checked his HbA1c: 6.9% (lower) - advised to check sugars at different times of the day - 1x a day, rotating check times - advised for yearly eye exams >> he is UTD - will check an ACR today - return to clinic in 4 months   2. HL -Reviewed latest lipid panel from 03/2023: At goal: Lab Results  Component Value Date   CHOL 167 03/30/2023   HDL 45.20 03/30/2023   LDLCALC 99 03/30/2023   TRIG 118.0 03/30/2023   CHOLHDL 4 03/30/2023  -He previously was on Lipitor 10 mg daily but stopped due to increased LFTs.  However, latest LFTs are normal Lab Results  Component Value Date   ALT 32 05/26/2023   AST 27 05/26/2023   ALKPHOS 95 05/26/2023   BILITOT 0.6 05/26/2023   Emilie Harden, MD PhD [facility] Endocr[facility]

## 2024-02-07 NOTE — Patient Instructions (Addendum)
 Please continue: - Lantus 30 units in am  Try to start: - Jardiance 10 mg before b'fast  STAY HYDRATED!  Please return in 3-4 months.

## 2024-02-08 ENCOUNTER — Encounter: Payer: Self-pay | Admitting: Internal Medicine

## 2024-02-08 ENCOUNTER — Ambulatory Visit (INDEPENDENT_AMBULATORY_CARE_PROVIDER_SITE_OTHER): Admitting: Family Medicine

## 2024-02-08 ENCOUNTER — Encounter: Payer: Self-pay | Admitting: Family Medicine

## 2024-02-08 VITALS — BP 120/60 | HR 52 | Temp 98.2°F | Wt 174.8 lb

## 2024-02-08 DIAGNOSIS — I83892 Varicose veins of left lower extremities with other complications: Secondary | ICD-10-CM | POA: Insufficient documentation

## 2024-02-08 LAB — MICROALBUMIN / CREATININE URINE RATIO
Creatinine, Urine: 90 mg/dL (ref 20–320)
Microalb Creat Ratio: 4 mg/g{creat} (ref <30)
Microalb, Ur: 0.4 mg/dL

## 2024-02-08 NOTE — Progress Notes (Signed)
   Subjective:    Patient ID: Bill [name], male    DOB: 1941/05/08, 82 y.o.   MRN: [id number]  HPI Here for swelling and discomfort in the left lower leg. He has had fluid retention in both ankles for years, but about 6 months ago the left foot and ankle began to swell more than usual. No SOB. Of note he had a vein stripping procedure in the left leg 50 years ago.    Review of Systems  Constitutional: Negative.   Respiratory: Negative.    Cardiovascular:  Positive for leg swelling. Negative for chest pain and palpitations.       Objective:   Physical Exam Constitutional:      Appearance: Normal appearance.  Cardiovascular:     Rate and Rhythm: Normal rate and regular rhythm.     Pulses: Normal pulses.     Heart sounds: Normal heart sounds.  Pulmonary:     Effort: Pulmonary effort is normal.     Breath sounds: Normal breath sounds.  Musculoskeletal:     Comments: The left lower leg has many visible varicosities. No tenderness. 2+ edema in the left lower leg, and 1+ edema on the right lower leg  Neurological:     Mental Status: He is alert.           Assessment & Plan:  Varicose veins, refer to the [facility]. Corita Diego, MD

## 2024-02-10 ENCOUNTER — Ambulatory Visit: Payer: Medicare HMO | Admitting: Internal Medicine

## 2024-02-10 DIAGNOSIS — Z08 Encounter for follow-up examination after completed treatment for malignant neoplasm: Secondary | ICD-10-CM | POA: Diagnosis not present

## 2024-02-10 DIAGNOSIS — L570 Actinic keratosis: Secondary | ICD-10-CM | POA: Diagnosis not present

## 2024-02-10 DIAGNOSIS — L820 Inflamed seborrheic keratosis: Secondary | ICD-10-CM | POA: Diagnosis not present

## 2024-02-10 DIAGNOSIS — Z85828 Personal history of other malignant neoplasm of skin: Secondary | ICD-10-CM | POA: Diagnosis not present

## 2024-02-10 DIAGNOSIS — D485 Neoplasm of uncertain behavior of skin: Secondary | ICD-10-CM | POA: Diagnosis not present

## 2024-02-10 DIAGNOSIS — D224 Melanocytic nevi of scalp and neck: Secondary | ICD-10-CM | POA: Diagnosis not present

## 2024-02-15 DIAGNOSIS — M79662 Pain in left lower leg: Secondary | ICD-10-CM | POA: Diagnosis not present

## 2024-02-15 DIAGNOSIS — I8312 Varicose veins of left lower extremity with inflammation: Secondary | ICD-10-CM | POA: Diagnosis not present

## 2024-02-15 DIAGNOSIS — I87392 Chronic venous hypertension (idiopathic) with other complications of left lower extremity: Secondary | ICD-10-CM | POA: Diagnosis not present

## 2024-02-15 DIAGNOSIS — I83892 Varicose veins of left lower extremities with other complications: Secondary | ICD-10-CM | POA: Diagnosis not present

## 2024-02-15 DIAGNOSIS — M79605 Pain in left leg: Secondary | ICD-10-CM | POA: Diagnosis not present

## 2024-02-22 DIAGNOSIS — G2581 Restless legs syndrome: Secondary | ICD-10-CM | POA: Diagnosis not present

## 2024-02-22 DIAGNOSIS — R600 Localized edema: Secondary | ICD-10-CM | POA: Diagnosis not present

## 2024-02-22 DIAGNOSIS — I8312 Varicose veins of left lower extremity with inflammation: Secondary | ICD-10-CM | POA: Diagnosis not present

## 2024-02-22 DIAGNOSIS — L299 Pruritus, unspecified: Secondary | ICD-10-CM | POA: Diagnosis not present

## 2024-03-08 DIAGNOSIS — I83892 Varicose veins of left lower extremities with other complications: Secondary | ICD-10-CM | POA: Diagnosis not present

## 2024-03-10 ENCOUNTER — Ambulatory Visit: Payer: Medicare HMO

## 2024-03-10 VITALS — BP 120/60 | HR 60 | Temp 98.4°F | Ht 68.0 in | Wt 173.7 lb

## 2024-03-10 DIAGNOSIS — Z0000 Encounter for general adult medical examination without abnormal findings: Secondary | ICD-10-CM | POA: Diagnosis not present

## 2024-03-10 NOTE — Progress Notes (Signed)
 Subjective:   Bill [name] is a 83 y.o. who presents for a Medicare Wellness preventive visit.  As a reminder, Annual Wellness Visits don't include a physical exam, and some assessments may be limited, especially if this visit is performed virtually. We may recommend an in-person follow-up visit with your provider if needed.  Visit Complete: In person    Persons Participating in Visit: Patient.  AWV Questionnaire: No: Patient Medicare AWV questionnaire was not completed prior to this visit.  Cardiac Risk Factors include: advanced age (>66men, >30 women);male gender     Objective:     Today's Vitals   03/10/24 1517  BP: 120/60  Pulse: 60  Temp: 98.4 F (36.9 C)  TempSrc: Oral  SpO2: 94%  Weight: 173 lb 11.2 oz (78.8 kg)  Height: 5\' 8"  (1.727 m)   Body mass index is 26.41 kg/m.     03/10/2024    3:26 PM 03/03/2023    3:59 PM 02/20/2022    2:32 PM 02/19/2021    4:04 PM 10/17/2020    7:30 PM 05/09/2020    2:08 PM 08/29/2019    2:23 PM  Advanced Directives  Does Patient Have a Medical Advance Directive? Yes No Yes Yes No No No  Type of Estate agent of [place];Living will  Healthcare Power of [place];Living will Healthcare Power of [place];Living will     Does patient want to make changes to medical advance directive?   No - Patient declined No - Patient declined     Copy of Healthcare Power of Attorney in Chart? No - copy requested  No - copy requested No - copy requested     Would patient like information on creating a medical advance directive?  No - Patient declined   No - Patient declined No - Patient declined Yes (MAU/Ambulatory/Procedural Areas - Information given)    Current Medications (verified) Outpatient Encounter Medications as of 03/10/2024  Medication Sig   ACCU-CHEK GUIDE TEST test strip TEST BLOOD SUGAR 2 TIMES DAILY AT 12 NOON AND 4 PM. USE AS INSTRUCTED   Accu-Chek Softclix Lancets lancets 100 each by Other route 2 times  daily at 12 noon and 4 pm. Use as instructed   acetaminophen  (TYLENOL ) 325 MG tablet Take 2 tablets (650 mg total) by mouth every 6 (six) hours as needed for mild pain (or Fever >/= 101).   albuterol  (VENTOLIN  HFA) 108 (90 Base) MCG/ACT inhaler Inhale 2 puffs into the lungs every 6 (six) hours as needed for wheezing or shortness of breath.   Ascorbic Acid  (VITAMIN C) 100 MG tablet Take 100 mg by mouth daily.   aspirin  81 MG tablet Take 81 mg by mouth daily.   benzonatate  (TESSALON ) 100 MG capsule Take 1 capsule (100 mg total) by mouth 3 (three) times daily as needed for cough.   bisoprolol  (ZEBETA ) 5 MG tablet TAKE 1/2 TABLET (2.5 MG) BY MOUTH EVERY MONDAY, WEDNESDAY AND FRIDAY.   Blood Glucose Monitoring Suppl (ACCU-CHEK GUIDE) w/Device KIT 1 kit by Other route 2 (two) times daily after a meal.   Calcium  Citrate-Vitamin D (CALCIUM  CITRATE +D PO) Take 1 tablet by mouth daily.    cyanocobalamin  1000 MCG tablet Take 1,000 mcg by mouth daily.    DROPLET PEN NEEDLES 32G X 4 MM MISC USE ONE TIME DAILY   empagliflozin  (JARDIANCE ) 10 MG TABS tablet Take 1 tablet (10 mg total) by mouth daily before breakfast. (Patient not taking: Reported on 02/08/2024)   fexofenadine  (ALLEGRA ) 180  MG tablet Take 1 tablet (180 mg total) by mouth daily.   insulin  glargine (LANTUS  SOLOSTAR) 100 UNIT/ML Solostar Pen INJECT 30 UNITS INTO THE SKIN EVERY MORNING.   nitroGLYCERIN  (NITROSTAT ) 0.4 MG SL tablet PLACE 1 TABLET (0.4 MG TOTAL) UNDER THE TONGUE EVERY 5 (FIVE) MINUTES AS NEEDED FOR CHEST PAIN.   repaglinide  (PRANDIN ) 0.5 MG tablet Take 1 tablet (0.5 mg total) by mouth 2 (two) times daily before a meal.   triamcinolone  cream (KENALOG ) 0.1 % Apply 1 Application topically 2 (two) times daily as needed.   Vitamin E 400 units TABS Take 400 Units by mouth daily.   Zinc  100 MG TABS Take 100 mg by mouth daily.   [DISCONTINUED] atorvastatin  (LIPITOR) 10 MG tablet Take 1 tablet (10 mg total) by mouth daily. (Patient not taking:  Reported on 09/26/2020)   No facility-administered encounter medications on file as of 03/10/2024.    Allergies (verified) Aspirin , Oxycodone, Penicillins, Percocet [oxycodone-acetaminophen ], Ciprofloxacin , and Prednisone    History: Past Medical History:  Diagnosis Date   Abnormal nuclear stress test 06/02/2019   Acute respiratory failure due to COVID-19 (HCC) 10/17/2020   Anemia    Angina pectoris (HCC) 06/02/2019   Formatting of this note might be different from the original. 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 Abnormal EGD 8/17   Headache 01/06/2021   Helicobacter pylori gastritis 07/21/2016   Medicare annual wellness visit, initial 01/05/2017   Formatting of this note might be different from the original. 3/18   Mixed dyslipidemia 01/12/2020   Neck pain 02/16/2020   Other chronic pain 09/02/2020   Penicillin allergy 07/21/2016   Pneumonia    Pneumonia due to COVID-19 virus 10/17/2020   Pulmonary nodule 02/28/2014   Formatting of this note might be different from the original. RML, no change   Right groin pain 06/16/2019   Skin cancer    Trigeminal neuralgia 07/26/2020   Type 2 diabetes mellitus without complication, without long-term current use of insulin  (HCC) 04/11/2021   Past Surgical History:  Procedure Laterality Date   APPENDECTOMY     back fusion     cataract  surgery Left    CHOLECYSTECTOMY     COLONOSCOPY  2014   clear    LEFT HEART CATH AND CORONARY ANGIOGRAPHY N/A 06/09/2019   Procedure: LEFT HEART CATH AND CORONARY ANGIOGRAPHY;  Surgeon: Lucendia Rusk, MD;  Location: [facility];  Service: Cardiovascular;  Laterality: N/A;   SPINE SURGERY  1984   thoracic spine fusion    VARICOSE VEIN SURGERY Left 1975   Family History  Problem Relation Age of Onset   Colon cancer Mother    Diabetes Father    Heart disease Father    Stroke Father    Hypertension Father    Hyperlipidemia Father    Heart disease Brother    Pancreatic cancer Cousin        x 2  Prostate cancer Cousin    Heart disease Son    Social History   Socioeconomic History   Marital status: Significant Other    Spouse name: Not on file   Number of children: 3   Years of education: 7   Highest education level: Not on file  Occupational History   Occupation: retired    Associate Professor: [employer]  Tobacco Use   Smoking status: Former    Current packs/day: 0.00    Types: Cigarettes    Quit date: 2007    Years since quitting: 18.3   Smokeless tobacco: Never  Vaping Use   Vaping status: Never Used  Substance and Sexual Activity   Alcohol use: No   Drug use: No   Sexual activity: Not on file  Other Topics Concern   Not on file  Social History Narrative   Lives with significant other in a 2 story home.  Has 3 children.  Retired.  Education: 7th grade.    Social Drivers of Corporate investment banker Strain: Low Risk  (03/10/2024)   Overall Financial Resource Strain (CARDIA)    Difficulty of Paying Living Expenses: Not hard at all  Food Insecurity: No Food Insecurity (03/10/2024)   Hunger Vital Sign    Worried About Running Out of Food in the Last Year: Never true    Ran Out of Food in the Last Year: Never true  Transportation Needs: No Transportation Needs (03/10/2024)   PRAPARE - Administrator, Civil Service (Medical): No    Lack  of Transportation (Non-Medical): No  Physical Activity: Sufficiently Active (03/10/2024)   Exercise Vital Sign    Days of Exercise per Week: 7 days    Minutes of Exercise per Session: 60 min  Stress: No Stress Concern Present (03/10/2024)   [employer] of Occupational Health - Occupational Stress Questionnaire    Feeling of Stress : Not at all  Social Connections: Moderately Isolated (03/10/2024)   Social Connection and Isolation Panel [NHANES]    Frequency of Communication with Friends and Family: More than three times a week    Frequency of Social Gatherings with Friends and Family: More than three times a week    Attends Religious Services: Never    Database administrator or Organizations: No    Attends Engineer, structural: Never    Marital Status: Married    Tobacco Counseling Counseling given: Not Answered    Clinical Intake:  Pre-visit preparation completed: Yes  Pain : No/denies pain     BMI - recorded: 26.41 Nutritional Status: BMI 25 -29 Overweight Nutritional Risks: None Diabetes: Yes CBG done?: No Did pt. bring in CBG monitor from home?: No  Lab Results  Component Value Date   HGBA1C 6.9 (A) 02/07/2024   HGBA1C 7.4 (A) 10/11/2023   HGBA1C 7.1 (A) 02/01/2023     How often do you need to have someone help you when you read instructions, pamphlets, or other written materials from your doctor or pharmacy?: 3 - Sometimes (Wife assist)  Interpreter Needed?: No  Information entered by :: Farris Hong LPN   Activities of Daily Living     03/10/2024    3:25 PM  In your present state of health, do you have any difficulty performing the following activities:  Hearing? 0  Vision? 0  Difficulty concentrating or making decisions? 0  Walking or climbing stairs? 0  Dressing or bathing? 0  Doing errands, shopping? 0  Preparing Food and  eating ? N  Using the Toilet? N  In the past six months, have you accidently leaked urine? N  Do you have  problems with loss of bowel control? N  Managing your Medications? N  Managing your Finances? N  Housekeeping or managing your Housekeeping? N    Patient Care Team: Donley Furth, MD as PCP - General (Family Medicine) Alver Austin, [facility] (Inactive) as Pharmacist (Pharmacist)  Indicate any recent Medical Services you may have received from other than [facility] providers in the past year (date may be approximate).     Assessment:    This is a routine wellness examination for Hien.  Hearing/Vision screen Hearing Screening - Comments:: Denies hearing difficulties   Vision Screening - Comments:: Wears rx glasses - up to date with routine eye exams with  Dr Erminio Hazy   Goals Addressed               This Visit's Progress     Remain active (pt-stated)         Depression Screen     03/10/2024    3:24 PM 03/03/2023    3:55 PM 05/18/2022    3:04 PM 02/20/2022    2:24 PM 09/26/2021    4:58 PM 02/19/2021    4:16 PM 02/19/2021    4:01 PM  PHQ 2/9 Scores  PHQ - 2 Score 0 0 2 0 0 0 0  PHQ- 9 Score   6  0      Fall Risk     03/10/2024    3:25 PM 03/03/2023    3:57 PM 05/18/2022    3:04 PM 02/20/2022    2:28 PM 09/26/2021    4:58 PM  Fall Risk   Falls in the past year? 0 0 1 1 0  Number falls in past yr: 0 0 0 0 0  Injury with Fall? 0 0 1 0 0  Comment    No Injury or medical attention needed   Risk for fall due to : No Fall Risks No Fall Risks No Fall Risks No Fall Risks No Fall Risks  Follow up Falls prevention discussed;Falls evaluation completed Falls prevention discussed Falls evaluation completed      MEDICARE RISK AT HOME:  Medicare Risk at Home Any stairs in or around the home?: Yes If so, are there any without handrails?: No Home free of loose throw rugs in walkways, pet beds, electrical cords, etc?: Yes Adequate lighting in your home to reduce risk of falls?: Yes Life alert?: No Use of a cane, walker or w/c?: No Grab bars in the bathroom?: No Shower chair or bench in  shower?: Yes Elevated toilet seat or a handicapped toilet?: Yes  TIMED UP AND GO:  Was the test performed?  Yes  Length of time to ambulate 10 feet: 10 sec Gait steady and fast without use of assistive device  Cognitive Function: 6CIT completed        03/10/2024    3:26 PM 03/03/2023    3:59 PM 02/20/2022    2:32 PM  6CIT Screen  What Year? 0 points 0 points 0 points  What month? 0 points 0 points 0 points  What time? 0 points 0 points 0 points  Count back from 20 0 points 0 points 0 points  Months in reverse -- 0 points 0 points  Repeat phrase 2 points 0 points 0 points  Total Score  0 points 0 points    Immunizations Immunization History  Administered  Date(s) Administered   Influenza-Unspecified 08/17/2012   Pneumococcal Polysaccharide-23 03/31/2002, 05/11/2016   Td 05/29/2023   Tdap 06/13/2015   Zoster, Live 12/29/2012    Screening Tests Health Maintenance  Topic Date Due   Zoster Vaccines- Shingrix (1 of 2) 09/10/1961   Pneumonia Vaccine 48+ Years old (2 of 2 - PCV) 05/11/2017   OPHTHALMOLOGY EXAM  04/24/2024 (Originally 09/10/1951)   COVID-19 Vaccine (1) 04/24/2024 (Originally 09/11/1946)   Diabetic kidney evaluation - eGFR measurement  05/25/2024   INFLUENZA VACCINE  05/26/2024   HEMOGLOBIN A1C  08/08/2024   Diabetic kidney evaluation - Urine ACR  02/06/2025   FOOT EXAM  02/06/2025   Medicare Annual Wellness (AWV)  03/10/2025   DTaP/Tdap/Td (3 - Td or Tdap) 05/28/2033   HPV VACCINES  Aged Out   Meningococcal B Vaccine  Aged Out   Lung Cancer Screening  Discontinued    Health Maintenance  Health Maintenance Due  Topic Date Due   Zoster Vaccines- Shingrix (1 of 2) 09/10/1961   Pneumonia Vaccine 49+ Years old (2 of 2 - PCV) 05/11/2017   Health Maintenance Items Addressed:   Additional Screening:  Vision Screening: Recommended annual ophthalmology exams for early detection of glaucoma and other disorders of the eye.  Dental Screening: Recommended  annual dental exams for proper oral hygiene  Community Resource Referral / Chronic Care Management: CRR required this visit?  No   CCM required this visit?  No   Plan:    I have personally reviewed and noted the following in the patient's chart:   Medical and social history Use of alcohol, tobacco or illicit drugs  Current medications and supplements including opioid prescriptions. Patient is not currently taking opioid prescriptions. Functional ability and status Nutritional status Physical activity Advanced directives List of other physicians Hospitalizations, surgeries, and ER visits in previous 12 months Vitals Screenings to include cognitive, depression, and falls Referrals and appointments  In addition, I have reviewed and discussed with patient certain preventive protocols, quality metrics, and best practice recommendations. A written personalized care plan for preventive services as well as general preventive health recommendations were provided to patient.   Dewayne Ford, LPN   1/61/0960   After Visit Summary: (In Person-Printed) AVS printed and given to the patient  Notes: Nothing significant to report at this time.

## 2024-03-10 NOTE — Patient Instructions (Addendum)
 [name] , Thank you for taking time out of your busy schedule to complete your Annual Wellness Visit with me. I enjoyed our conversation and look forward to speaking with you again next year. I, as well as your care team,  appreciate your ongoing commitment to your health goals. Please review the following plan we discussed and let me know if I can assist you in the future. Your Game plan/ To Do List    Referrals: If you haven't heard from the office you've been referred to, please reach out to them at the phone provided.   Follow up Visits: Next Medicare AWV with our clinical staff: 03/16/25 @ 3p   Have you seen your provider in the last 6 months (3 months if uncontrolled diabetes)? Yes 11/14/23 Next Office Visit with your provider:   Clinician Recommendations:  Aim for 30 minutes of exercise or brisk walking, 6-8 glasses of water, and 5 servings of fruits and vegetables each day.       This is a list of the screening recommended for you and due dates:  Health Maintenance  Topic Date Due   Zoster (Shingles) Vaccine (1 of 2) 09/10/1961   Pneumonia Vaccine (2 of 2 - PCV) 05/11/2017   Eye exam for diabetics  04/24/2024*   COVID-19 Vaccine (1) 04/24/2024*   Yearly kidney function blood test for diabetes  05/25/2024   Flu Shot  05/26/2024   Hemoglobin A1C  08/08/2024   Yearly kidney health urinalysis for diabetes  02/06/2025   Complete foot exam   02/06/2025   Medicare Annual Wellness Visit  03/10/2025   DTaP/Tdap/Td vaccine (3 - Td or Tdap) 05/28/2033   HPV Vaccine  Aged Out   Meningitis B Vaccine  Aged Out   Screening for Lung Cancer  Discontinued  *Topic was postponed. The date shown is not the original due date.    Advanced directives: (Copy Requested) Please bring a copy of your health care power of attorney and living will to the office to be added to your chart at your convenience. You can mail to [facility] [address] [place], [place] [address] or email to  [email or web address] Advance Care Planning is important because it:  [x]  Makes sure you receive the medical care that is consistent with your values, goals, and preferences  [x]  It provides guidance to your family and loved ones and reduces their decisional burden about whether or not they are making the right decisions based on your wishes.  Follow the link provided in your after visit summary or read over the paperwork we have mailed to you to help you started getting your Advance Directives in place. If you need assistance in completing these, please reach out to us  so that we can help you!  See attachments for Preventive Care and Fall Prevention Tips.

## 2024-03-17 DIAGNOSIS — I83892 Varicose veins of left lower extremities with other complications: Secondary | ICD-10-CM | POA: Diagnosis not present

## 2024-03-17 DIAGNOSIS — Z09 Encounter for follow-up examination after completed treatment for conditions other than malignant neoplasm: Secondary | ICD-10-CM | POA: Diagnosis not present

## 2024-03-17 DIAGNOSIS — I87392 Chronic venous hypertension (idiopathic) with other complications of left lower extremity: Secondary | ICD-10-CM | POA: Diagnosis not present

## 2024-03-29 DIAGNOSIS — I83892 Varicose veins of left lower extremities with other complications: Secondary | ICD-10-CM | POA: Diagnosis not present

## 2024-03-29 DIAGNOSIS — I83812 Varicose veins of left lower extremities with pain: Secondary | ICD-10-CM | POA: Diagnosis not present

## 2024-04-12 DIAGNOSIS — I83892 Varicose veins of left lower extremities with other complications: Secondary | ICD-10-CM | POA: Diagnosis not present

## 2024-04-12 DIAGNOSIS — I87392 Chronic venous hypertension (idiopathic) with other complications of left lower extremity: Secondary | ICD-10-CM | POA: Diagnosis not present

## 2024-05-02 ENCOUNTER — Ambulatory Visit: Payer: Self-pay

## 2024-05-02 NOTE — Telephone Encounter (Signed)
 Called and spoke with patient unable to be seen for Dental pain

## 2024-05-02 NOTE — Telephone Encounter (Signed)
 FYI Only or Action Required?: Action required by provider: would like a rx for tooth abscess.  Apt. Thursday.   Patient was last seen in primary care on 02/08/2024 by Bill [name] LABOR, MD.  Called Nurse Triage reporting Dental Pain.  Symptoms began several days ago.  Interventions attempted: Nothing.  Symptoms are: gradually worsening.  Triage Disposition: Call Dentist When Office is Open  Patient/caregiver understands and will follow disposition?: Yes       Copied from CRM [id number]. Topic: Clinical - Medication Question >> May 02, 2024 12:27 PM Drema MATSU wrote: Reason for CRM: Patient stated that his nose going towards his eye is sore. Patient upper tooth is sore and he thinks it may be an abcess. He is requesting a prescription for antibiotic. Reason for Disposition  Toothache present > 24 hours  Answer Assessment - Initial Assessment Questions 1. LOCATION: Which tooth is hurting?  (e.g., right-side/left-side, upper/lower, front/back)     Third tooth on left side upper 2. ONSET: When did the toothache start?  (e.g., hours, days)      The other night 3. SEVERITY: How bad is the toothache?  (Scale 1-10; mild, moderate or severe)   - MILD (1-3): doesn't interfere with chewing    - MODERATE (4-7): interferes with chewing, interferes with normal activities, awakens from sleep     - SEVERE (8-10): unable to eat, unable to do any normal activities, excruciating pain        mild 4. SWELLING: Is there any visible swelling of your face?     yes 5. OTHER SYMPTOMS: Do you have any other symptoms? (e.g., fever)     no 6. PREGNANCY: Is there any chance you are pregnant? When was your last menstrual period?     na  Protocols used: Toothache-A-AH

## 2024-05-04 ENCOUNTER — Ambulatory Visit: Admitting: Family Medicine

## 2024-05-12 ENCOUNTER — Ambulatory Visit: Payer: Self-pay

## 2024-05-12 NOTE — Telephone Encounter (Signed)
 FYI Only or Action Required?: FYI only for provider.  Patient was last seen in primary care on 02/08/2024 by Bill [name] LABOR, MD.  Called Nurse Triage reporting Covid Exposure.   Triage Disposition: Home Care  Patient/caregiver understands and will follow disposition?: Yes    Copied from CRM [id number]. Topic: Clinical - Medical Advice >> May 12, 2024 12:58 PM Larissa S wrote: Reason for CRM: Patient requesting information on Covid. He would like to know how long he should avoid being in contact with a person who has had Covid. Requesting a callback from nurse.  Callback # [phone], ok to leave message Reason for Disposition  [1] Does not meet COVID-19 EXPOSURE criteria BUT [2] caller still concerned about COVID-19 EXPOSURE (e.g., living with someone who was exposed and who has no symptoms of COVID-19)  Answer Assessment - Initial Assessment Questions 1. COVID-19 EXPOSURE: Please describe how you were exposed to someone with a COVID-19 infection.     Pt has questions about COVID + exposure precautions. Pt reports that he will be meeting with someone living with a household member who is COVID +  2. PLACE of CONTACT: Where were you when you were exposed to COVID-19? (e.g., home, school, medical waiting room; which city?)     N/a 3. TYPE of CONTACT: How much contact was there? (e.g., sitting next to, live in same house, work in same office, same building)     n/a 4. DURATION of CONTACT: How long were you in contact with the COVID-19 patient? (e.g., a few seconds, passed by person, a few minutes, 15 minutes or longer, live with the patient)     N/a 5. MASK: Were you wearing a mask? Was the other person wearing a mask? Note: wearing a mask reduces the risk of an otherwise close contact.     Triager encourage mask wearing at pt discretion if pt decides to meet friend. Triager also reviewed CDC Respiratory Virus Guidance. Patient verbalized understanding.  6. DATE of CONTACT:  When did you have contact with a COVID-19 patient? (e.g., how many days ago)     N/a 7. COMMUNITY SPREAD: Do you live in or have you traveled to an area where there are lots of COVID-19 cases (community spread)? (See public health department website, if unsure)       N/a 8. SYMPTOMS: Do you have any symptoms? (e.g., fever, cough, breathing difficulty, loss of taste or smell)     denies 9. VACCINE: Have you gotten the COVID-19 vaccine? If Yes, ask: Which one, how many shots, when did you get it?     Did not receive vaccines 10. PREGNANCY OR POSTPARTUM: Is there any chance you are pregnant? When was your last menstrual period? Did you deliver in the last 2 weeks?       N/A 11. HIGH RISK: Do you have any heart or lung problems? (e.g., asthma, COPD, heart failure) Do you have a weak immune system or other risk factors? (e.g., HIV positive, chemotherapy, renal failure, diabetes mellitus, sickle cell anemia, obesity)       Yes, COPD, heart hx  Protocols used: Coronavirus (COVID-19) Exposure-A-AH

## 2024-05-16 DIAGNOSIS — M7989 Other specified soft tissue disorders: Secondary | ICD-10-CM | POA: Diagnosis not present

## 2024-05-16 DIAGNOSIS — I83812 Varicose veins of left lower extremities with pain: Secondary | ICD-10-CM | POA: Diagnosis not present

## 2024-05-17 ENCOUNTER — Other Ambulatory Visit: Payer: Self-pay | Admitting: Family Medicine

## 2024-05-25 ENCOUNTER — Inpatient Hospital Stay (HOSPITAL_BASED_OUTPATIENT_CLINIC_OR_DEPARTMENT_OTHER): Payer: Medicare HMO | Admitting: Hematology and Oncology

## 2024-05-25 ENCOUNTER — Inpatient Hospital Stay: Payer: Medicare HMO | Attending: Hematology and Oncology

## 2024-05-25 ENCOUNTER — Other Ambulatory Visit: Payer: Self-pay | Admitting: Hematology and Oncology

## 2024-05-25 VITALS — BP 152/65 | HR 50 | Temp 97.6°F | Resp 13 | Wt 170.0 lb

## 2024-05-25 DIAGNOSIS — D471 Chronic myeloproliferative disease: Secondary | ICD-10-CM

## 2024-05-25 DIAGNOSIS — D72825 Bandemia: Secondary | ICD-10-CM | POA: Diagnosis not present

## 2024-05-25 DIAGNOSIS — D72829 Elevated white blood cell count, unspecified: Secondary | ICD-10-CM | POA: Diagnosis not present

## 2024-05-25 DIAGNOSIS — R319 Hematuria, unspecified: Secondary | ICD-10-CM | POA: Insufficient documentation

## 2024-05-25 DIAGNOSIS — Z8616 Personal history of COVID-19: Secondary | ICD-10-CM | POA: Diagnosis not present

## 2024-05-25 DIAGNOSIS — Z800 Family history of malignant neoplasm of digestive organs: Secondary | ICD-10-CM | POA: Insufficient documentation

## 2024-05-25 DIAGNOSIS — Z87891 Personal history of nicotine dependence: Secondary | ICD-10-CM | POA: Insufficient documentation

## 2024-05-25 LAB — LACTATE DEHYDROGENASE: LDH: 273 U/L — ABNORMAL HIGH (ref 98–192)

## 2024-05-25 LAB — CMP (CANCER CENTER ONLY)
ALT: 28 U/L (ref 0–44)
AST: 26 U/L (ref 15–41)
Albumin: 3.7 g/dL (ref 3.5–5.0)
Alkaline Phosphatase: 120 U/L (ref 38–126)
Anion gap: 5 (ref 5–15)
BUN: 9 mg/dL (ref 8–23)
CO2: 28 mmol/L (ref 22–32)
Calcium: 8.8 mg/dL — ABNORMAL LOW (ref 8.9–10.3)
Chloride: 104 mmol/L (ref 98–111)
Creatinine: 0.79 mg/dL (ref 0.61–1.24)
GFR, Estimated: >60 mL/min (ref >60)
Glucose, Bld: 254 mg/dL — ABNORMAL HIGH (ref 70–99)
Potassium: 4 mmol/L (ref 3.5–5.1)
Sodium: 137 mmol/L (ref 135–145)
Total Bilirubin: 0.5 mg/dL (ref 0.0–1.2)
Total Protein: 6.8 g/dL (ref 6.5–8.1)

## 2024-05-25 LAB — CBC WITH DIFFERENTIAL (CANCER CENTER ONLY)
Abs Immature Granulocytes: 0.13 K/uL — ABNORMAL HIGH (ref 0.00–0.07)
Basophils Absolute: 0.3 K/uL — ABNORMAL HIGH (ref 0.0–0.1)
Basophils Relative: 2 %
Eosinophils Absolute: 0.7 K/uL — ABNORMAL HIGH (ref 0.0–0.5)
Eosinophils Relative: 3 %
HCT: 42.4 % (ref 39.0–52.0)
Hemoglobin: 14.7 g/dL (ref 13.0–17.0)
Immature Granulocytes: 1 %
Lymphocytes Relative: 13 %
Lymphs Abs: 2.5 K/uL (ref 0.7–4.0)
MCH: 32.2 pg (ref 26.0–34.0)
MCHC: 34.7 g/dL (ref 30.0–36.0)
MCV: 92.8 fL (ref 80.0–100.0)
Monocytes Absolute: 0.8 K/uL (ref 0.1–1.0)
Monocytes Relative: 4 %
Neutro Abs: 15.6 K/uL — ABNORMAL HIGH (ref 1.7–7.7)
Neutrophils Relative %: 77 %
Platelet Count: 298 K/uL (ref 150–400)
RBC: 4.57 MIL/uL (ref 4.22–5.81)
RDW: 17.3 % — ABNORMAL HIGH (ref 11.5–15.5)
WBC Count: 20.1 K/uL — ABNORMAL HIGH (ref 4.0–10.5)
nRBC: 0 % (ref 0.0–0.2)

## 2024-05-25 NOTE — Progress Notes (Signed)
 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 Federico. WBC 16.4, Hgb 15.6, MCV 96.7, Plt 294. MPN panel returns JAK2 positive 6) 11/13/2020: WBC 16.0, Hgb 13.5, MCV 94.4, Plt 392 6) 05/22/2022: WBC 15.3, Hgb 15.4, MCV 91.2, Plt 301  Interval History:  Bill [name] 83 y.o. male with medical history significant for JAK2 positive leukocytosis who presents for a follow up visit. The patient's last visit was on 05/25/2024. In the interim since the last visit he has no major changes in his health.  On exam today Bill [name] is accompanied by his wife.  He reports he has been well overall in interim since her last visit 1 year ago.  He has had no hospitalizations or ER visits.  He had no changes in his medications.  He reports his energy and appetite are good.  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  He reports that he describes them as knots.  He reports that he does not have any ankle swelling at this time.  He notes he is had no trouble with fevers, chills, sweats.  He wears stockings during the day for his leg swelling.  He was outside yesterday in the intense heat because a tree fell on his driveway and use a chainsaw.  He reports his shirt was soaked and he felt wiped out yesterday.  He is not having any lightheadedness, dizziness, shortness of breath.  He does have some occasional stomach discomfort but no drop  in appetite or nausea, vomiting,.  He does have some occasional loose stools.  He otherwise denies any new symptoms.  A full 10 point ROS is otherwise negative.  MEDICAL HISTORY:  Past Medical History:  Diagnosis Date   Abnormal nuclear stress test 06/02/2019   Acute respiratory failure due to COVID-19 [facility]) 10/17/2020   Anemia    Angina pectoris (HCC) 06/02/2019   Formatting of this note might be different from the original. 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 Abnormal EGD 8/17   Headache 01/06/2021   Helicobacter pylori gastritis 07/21/2016   Medicare annual wellness visit, initial 01/05/2017   Formatting of this note might be different from the original. 3/18   Mixed dyslipidemia 01/12/2020   Neck pain 02/16/2020   Other chronic pain 09/02/2020   Penicillin allergy 07/21/2016   Pneumonia  Pneumonia due to COVID-19 virus 10/17/2020   Pulmonary nodule 02/28/2014   Formatting of this note might be different from the original. RML, no change   Right groin pain 06/16/2019   Skin cancer    Trigeminal neuralgia 07/26/2020   Type 2 diabetes mellitus without complication, without long-term current use of insulin  (HCC) 04/11/2021    SURGICAL HISTORY: Past Surgical History:  Procedure Laterality Date   APPENDECTOMY     back fusion     cataract surgery Left    CHOLECYSTECTOMY      COLONOSCOPY  2014   clear    LEFT HEART CATH AND CORONARY ANGIOGRAPHY N/A 06/09/2019   Procedure: LEFT HEART CATH AND CORONARY ANGIOGRAPHY;  Surgeon: Dann Candyce RAMAN, MD;  Location: [facility];  Service: Cardiovascular;  Laterality: N/A;   SPINE SURGERY  1984   thoracic spine fusion    VARICOSE VEIN SURGERY Left 1975    SOCIAL HISTORY: Social History   Socioeconomic History   Marital status: Significant Other    Spouse name: Not on file   Number of children: 3   Years of education: 7   Highest education level: Not on file  Occupational History   Occupation: retired    Associate Professor: [employer]  Tobacco Use   Smoking status: Former    Current packs/day: 0.00    Types: Cigarettes    Quit date: 2007    Years since quitting: 18.5   Smokeless tobacco: Never  Vaping Use   Vaping status: Never Used  Substance and Sexual Activity   Alcohol use: No   Drug use: No   Sexual activity: Not on file  Other Topics Concern   Not on file  Social History Narrative   Lives with significant other in a 2 story home.  Has 3 children.  Retired.  Education: 7th grade.    Social Drivers of Corporate investment banker Strain: Low Risk  (03/10/2024)   Overall Financial Resource Strain (CARDIA)    Difficulty of Paying Living Expenses: Not hard at all  Food Insecurity: No Food Insecurity (03/10/2024)   Hunger Vital Sign    Worried About Running Out of Food in the Last Year: Never true    Ran Out of Food in the Last Year: Never true  Transportation Needs: No Transportation Needs (03/10/2024)   PRAPARE - Administrator, Civil Service (Medical): No    Lack of Transportation (Non-Medical): No  Physical Activity: Sufficiently Active (03/10/2024)   Exercise Vital Sign    Days of Exercise per Week: 7 days    Minutes of Exercise per Session: 60 min  Stress: No Stress Concern Present (03/10/2024)   [employer] of Occupational Health - Occupational Stress Questionnaire     Feeling of Stress : Not at all  Social Connections: Moderately Isolated (03/10/2024)   Social Connection and Isolation Panel    Frequency of Communication with Friends and Family: More than three times a week    Frequency of Social Gatherings with Friends and Family: More than three times a week    Attends Religious Services: Never    Database administrator or Organizations: No    Attends Banker Meetings: Never    Marital Status: Married  Catering manager Violence: Not At Risk (03/10/2024)   Humiliation, Afraid, Rape, and Kick questionnaire    Fear of Current or Ex-Partner: No    Emotionally Abused: No    Physically Abused: No  Sexually Abused: No    FAMILY HISTORY: Family History  Problem Relation Age of Onset   Colon cancer Mother    Diabetes Father    Heart disease Father    Stroke Father    Hypertension Father    Hyperlipidemia Father    Heart disease Brother    Pancreatic cancer Cousin        x 2   Prostate cancer Cousin    Heart disease Son     ALLERGIES:  is allergic to aspirin , oxycodone, penicillins, percocet [oxycodone-acetaminophen ], ciprofloxacin , and prednisone .  MEDICATIONS:  Current Outpatient Medications  Medication Sig Dispense Refill   ACCU-CHEK GUIDE TEST test strip TEST BLOOD SUGAR 2 TIMES DAILY AT 12 NOON AND 4 PM. USE AS INSTRUCTED 200 strip 3   Accu-Chek Softclix Lancets lancets 100 each by Other route 2 times daily at 12 noon and 4 pm. Use as instructed 100 each 12   acetaminophen  (TYLENOL ) 325 MG tablet Take 2 tablets (650 mg total) by mouth every 6 (six) hours as needed for mild pain (or Fever >/= 101).     albuterol  (VENTOLIN  HFA) 108 (90 Base) MCG/ACT inhaler Inhale 2 puffs into the lungs every 6 (six) hours as needed for wheezing or shortness of breath. 8 g 0   Ascorbic Acid  (VITAMIN C) 100 MG tablet Take 100 mg by mouth daily.     aspirin  81 MG tablet Take 81 mg by mouth daily.     benzonatate  (TESSALON ) 100 MG capsule Take 1  capsule (100 mg total) by mouth 3 (three) times daily as needed for cough. 20 capsule 0   bisoprolol  (ZEBETA ) 5 MG tablet TAKE 1/2 TABLET EVERY MONDAY, WEDNESDAY AND FRIDAY. 20 tablet 3   Blood Glucose Monitoring Suppl (ACCU-CHEK GUIDE) w/Device KIT 1 kit by Other route 2 (two) times daily after a meal. 1 kit 0   Calcium  Citrate-Vitamin D (CALCIUM  CITRATE +D PO) Take 1 tablet by mouth daily.      cyanocobalamin  1000 MCG tablet Take 1,000 mcg by mouth daily.      DROPLET PEN NEEDLES 32G X 4 MM MISC USE ONE TIME DAILY 100 each 3   empagliflozin  (JARDIANCE ) 10 MG TABS tablet Take 1 tablet (10 mg total) by mouth daily before breakfast. (Patient not taking: Reported on 02/08/2024) 90 tablet 3   fexofenadine  (ALLEGRA ) 180 MG tablet Take 1 tablet (180 mg total) by mouth daily. 90 tablet 2   insulin  glargine (LANTUS  SOLOSTAR) 100 UNIT/ML Solostar Pen INJECT 30 UNITS INTO THE SKIN EVERY MORNING. 30 mL 2   nitroGLYCERIN  (NITROSTAT ) 0.4 MG SL tablet PLACE 1 TABLET (0.4 MG TOTAL) UNDER THE TONGUE EVERY 5 (FIVE) MINUTES AS NEEDED FOR CHEST PAIN. 25 tablet 1   repaglinide  (PRANDIN ) 0.5 MG tablet Take 1 tablet (0.5 mg total) by mouth 2 (two) times daily before a meal. 180 tablet 3   triamcinolone  cream (KENALOG ) 0.1 % Apply 1 Application topically 2 (two) times daily as needed. 30 g 1   Vitamin E 400 units TABS Take 400 Units by mouth daily.     Zinc  100 MG TABS Take 100 mg by mouth daily.     No current facility-administered medications for this visit.    REVIEW OF SYSTEMS:   Constitutional: ( - ) fevers, ( - )  chills , ( - ) night sweats Eyes: ( - ) blurriness of vision, ( - ) double vision, ( - ) watery eyes Ears, nose, mouth, throat, and face: ( - )  mucositis, ( - ) sore throat Respiratory: ( - ) cough, ( - ) dyspnea, ( - ) wheezes Cardiovascular: ( - ) palpitation, ( - ) chest discomfort, ( - ) lower extremity swelling Gastrointestinal:  ( - ) nausea, ( - ) heartburn, ( - ) change in bowel  habits Skin: ( - ) abnormal skin rashes Lymphatics: ( - ) new lymphadenopathy, ( - ) easy bruising Neurological: ( - ) numbness, ( - ) tingling, ( - ) new weaknesses Behavioral/Psych: ( - ) mood change, ( - ) new changes  All other systems were reviewed with the patient and are negative.  PHYSICAL EXAMINATION: ECOG PERFORMANCE STATUS: 0 - Asymptomatic  Vitals:   05/25/24 1518  BP: (!) 152/65  Pulse: (!) 50  Resp: 13  Temp: 97.6 F (36.4 C)  SpO2: 100%      Filed Weights   05/25/24 1518  Weight: 170 lb (77.1 kg)       GENERAL: well appearing elderly Caucasian male. alert, no distress and comfortable SKIN: skin color, texture, turgor are normal, no rashes or significant lesions EYES: conjunctiva are pink and non-injected, sclera clear LUNGS: clear to auscultation and percussion with normal breathing effort HEART: regular rate & rhythm and no murmurs and no lower extremity edema Musculoskeletal: no cyanosis of digits and no clubbing  PSYCH: alert & oriented x 3, fluent speech NEURO: no focal motor/sensory deficits  LABORATORY DATA:  I have reviewed the data as listed    Latest Ref Rng & Units 05/25/2024    2:43 PM 05/26/2023    2:38 PM 03/30/2023    2:53 PM  CBC  WBC 4.0 - 10.5 K/uL 20.1  15.3  16.9   Hemoglobin 13.0 - 17.0 g/dL 85.2  83.7  84.2   Hematocrit 39.0 - 52.0 % 42.4  48.3  48.2   Platelets 150 - 400 K/uL 298  270  300.0        Latest Ref Rng & Units 05/25/2024    2:43 PM 05/26/2023    2:38 PM 03/30/2023    2:53 PM  CMP  Glucose 70 - 99 mg/dL 745  751  87   BUN 8 - 23 mg/dL 9  9  10    Creatinine 0.61 - 1.24 mg/dL 9.20  9.13  9.11   Sodium 135 - 145 mmol/L 137  139  143   Potassium 3.5 - 5.1 mmol/L 4.0  4.2  3.9   Chloride 98 - 111 mmol/L 104  107  104   CO2 22 - 32 mmol/L 28  27  21    Calcium  8.9 - 10.3 mg/dL 8.8  9.1  9.5   Total Protein 6.5 - 8.1 g/dL 6.8  6.4  6.9   Total Bilirubin 0.0 - 1.2 mg/dL 0.5  0.6  0.8   Alkaline Phos 38 - 126 U/L 120   95  110   AST 15 - 41 U/L 26  27  38   ALT 0 - 44 U/L 28  32  39     RADIOGRAPHIC STUDIES: No results found.   ASSESSMENT & PLAN Bill [name] 83 y.o. male with medical history significant for JAK2 positive leukocytosis who presents for a follow up visit.  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  The exact myeloproliferative neoplasm is not clear at this time as the patient only has an  elevation of white blood cell count with no elevation in his platelets or red blood cells.  Given this the patient does not require cytoreductive therapy and can be managed on baby aspirin  alone, which he is already taking for his cardiac disease.  Technically in order to confirm the diagnosis of an MPN a bone marrow biopsy is required.  In this case it would confirm the diagnosis, however it would not change our management.  As his counts are currently within normal limits he would not require hydroxyurea or other treatments in order to lower the counts.  As such the bone marrow biopsy will be predominantly academic.  Myelofibrosis is also a consideration, however given that there is no disease altering treatment we could wait until there was a drop in his blood counts before considering the bone marrow biopsy.  Overall I think we can continue with observation in order to assure that the patient does not convert into a leukemia or a myelofibrosis with cytopenias.  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 He has an MPN, but the exact one is not clear based off the peripheral blood finds.  --a bone marrow biopsy can be done to confirm a diagnosis, but with no elevated RBC or Plt counts we would not start cytoreductive therapy. The utility the bone marrow is for diagnosis only, it would not change  the course of treatment --myelofibrosis is certainly on the differential, but as there is no treatment that alters the course of the disease it would be reasonable to hold until the patient began developing cytopenias before considering bone marrow biopsy.  Additionally has had remarkably stable counts for over a decade. PLAN:  --continue ASA 81mg  PO daily (patient takes for cardiac disease) -- Labs today show white blood cell count 20.1, hemoglobin 14.7, MCV 92.8, platelets 298 --RTC in 12 months for continued monitoring   # Blood in Urine  -- Patient notes he was referred to urology and was started on a medication for this but the symptoms have not resolved. --Hemoglobin within normal limits today. -- Defer management to patient's urologist.   No orders of the defined types were placed in this encounter.   All questions were answered. The patient knows to call the clinic with any problems, questions or concerns.  A total of more than 30 minutes were spent on this encounter and over half of that time was spent on counseling and coordination of care as outlined above.   Norleen IVAR Kidney, MD Department of Hematology/Oncology [facility] Phone: [phone] Pager: [phone] Email: [email or web address]  05/25/2024 4:41 PM

## 2024-05-30 DIAGNOSIS — I83892 Varicose veins of left lower extremities with other complications: Secondary | ICD-10-CM | POA: Diagnosis not present

## 2024-06-05 ENCOUNTER — Ambulatory Visit: Admitting: Internal Medicine

## 2024-06-05 ENCOUNTER — Encounter: Payer: Self-pay | Admitting: Internal Medicine

## 2024-06-05 VITALS — BP 120/64 | HR 63 | Ht 68.0 in | Wt 169.0 lb

## 2024-06-05 DIAGNOSIS — E782 Mixed hyperlipidemia: Secondary | ICD-10-CM | POA: Diagnosis not present

## 2024-06-05 DIAGNOSIS — Z794 Long term (current) use of insulin: Secondary | ICD-10-CM | POA: Diagnosis not present

## 2024-06-05 DIAGNOSIS — E1165 Type 2 diabetes mellitus with hyperglycemia: Secondary | ICD-10-CM | POA: Diagnosis not present

## 2024-06-05 DIAGNOSIS — E1159 Type 2 diabetes mellitus with other circulatory complications: Secondary | ICD-10-CM | POA: Diagnosis not present

## 2024-06-05 LAB — POCT GLYCOSYLATED HEMOGLOBIN (HGB A1C): Hemoglobin A1C: 7.2 % — AB (ref 4.0–5.6)

## 2024-06-05 NOTE — Addendum Note (Signed)
 Addended by: CLEOTILDE ROLIN RAMAN on: 06/05/2024 02:35 PM   Modules accepted: Orders

## 2024-06-05 NOTE — Patient Instructions (Addendum)
 Please continue: - Lantus  30 units in am  STOP SWEET DRINKS!  Please return in 4-6 months.

## 2024-06-05 NOTE — Progress Notes (Signed)
 Patient ID: Bill [name], male   DOB: Jul 04, 1941, 83 y.o.   MRN: [id number]  HPI: Bill [name] is a 83 y.o.-year-old male, returning for follow-up for DM2, dx in 2019, insulin -dependent since 2022, uncontrolled, with complications (CAD, PAD). Pt. previously saw Dr. Kassie, but last visit with me 4 months ago. He is here with his lady partner.  She helps him with his medications since patient had memory loss.  Interim history: No increased urination, blurry vision, nausea, chest pain. He had L leg vein surgery >> he continues to follow up with them, may need a new sx He has sweet drinks occasionally.  Reviewed HbA1c: Lab Results  Component Value Date   HGBA1C 6.9 (A) 02/07/2024   HGBA1C 7.4 (A) 10/11/2023   HGBA1C 7.1 (A) 02/01/2023   HGBA1C 6.7 (A) 10/01/2022   HGBA1C 10.5 (A) 06/23/2022   HGBA1C 7.8 (A) 12/11/2021   HGBA1C 6.9 (A) 08/27/2021   HGBA1C 7.2 (A) 06/24/2021   HGBA1C 7.8 (A) 06/11/2021   HGBA1C 16.8 Repeated and verified X2. (H) 04/11/2021  06/17/2023: HbA1c 7.1%  Previously on: - Lantus  60 >> 18 >> 30 units at bedtime Per Dr. Laymond note, he was not started on multiple daily injections (NovoLog ) due to memory loss. Metformin  did not work for him in the past.   We changed to: - Lantus  30 units in am Previously tried: - Repaglinide  0.5 mg before the main meals-added 09/2023 >> stopped 2/2 stomach pain - Jardiance  10 mg before breakfast-recommended again in 01/2024.  He is still not taking this >> decided not to use it 2/2 fear of SE (her partner's) and also expensive - NovoLog  6-10 units 15 min before the 2 meals of the day >> stopped before our visit in 09/2023 2/2 price  Pt checks his sugars 1x a day and they are: - am: 135-165, 173 >> before eating, but smtms after coffee: 104-161 >> 118-141, 189 >> 121-163, 187  - 2h after brunch: 167 >> n/c >> 137-177, 187 >> n/c >> 254 >> n/c - before dinner: n/c - 2h after dinner: n/c >> 175 >> n/c  - bedtime:  n/c - nighttime: n/c >> 229, 277 >> n/c >> 134 >> n/c Lowest sugar was  135 >> 104 >> 118 >> 121; he has hypoglycemia awareness at 90.  Highest sugar was 400 >> .SABRA.173 >> 187 >> 254>> 187.  Glucometer: Accu-Chek guide  Pt's meals are: - Brunch: cereal (corn flakes) + whole milk; oatmeal with milk and butter - Dinner: meat + veggies/salad + starch (potato, bisquit) - Snacks: I have a huge sweet tooth - chocolate, marshmallows, molasses, jelly, occasional regular sodas   - no CKD, last BUN/creatinine:  Lab Results  Component Value Date   BUN 9 05/25/2024   BUN 9 05/26/2023   CREATININE 0.79 05/25/2024   CREATININE 0.86 05/26/2023   Lab Results  Component Value Date   MICRALBCREAT 4 02/07/2024  He is not on ACE inhibitor/ARB.  -+ HL; last set of lipids: Lab Results  Component Value Date   CHOL 167 03/30/2023   HDL 45.20 03/30/2023   LDLCALC 99 03/30/2023   TRIG 118.0 03/30/2023   CHOLHDL 4 03/30/2023  Previously on Lipitor 10 mg daily >> off because of increased LFTs with statins in the past.  - last eye exam was in 2024: No DR.   - no numbness and tingling in his feet.  Last foot exam 02/07/2024.  He saw podiatry in the past.  He  is on ASA 81.  He has a history of COPD, anemia, GERD, headache, neck pain, pulmonary nodule, myeloproliferative neoplasm. Also, he has LE varices - he had veins stripped in 1975. He had an abnormal stress test.  ROS: + see HPI  Past Medical History:  Diagnosis Date   Abnormal nuclear stress test 06/02/2019   Acute respiratory failure due to COVID-19 (HCC) 10/17/2020   Anemia    Angina pectoris (HCC) 06/02/2019   Formatting of this note might be different from the original. 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 Abnormal EGD 8/17   Headache 01/06/2021   Helicobacter pylori gastritis 07/21/2016   Medicare annual wellness visit, initial 01/05/2017   Formatting of this note might be different from the original. 3/18   Mixed dyslipidemia 01/12/2020   Neck pain 02/16/2020   Other chronic pain 09/02/2020   Penicillin allergy 07/21/2016   Pneumonia    Pneumonia due to COVID-19 virus 10/17/2020   Pulmonary nodule 02/28/2014   Formatting of this note might be different from the original. RML, no change   Right groin pain 06/16/2019   Skin cancer    Trigeminal neuralgia 07/26/2020   Type 2 diabetes mellitus without complication, without long-term current use of insulin  (HCC) 04/11/2021   Past Surgical History:  Procedure Laterality Date   APPENDECTOMY     back fusion     cataract surgery Left    CHOLECYSTECTOMY     COLONOSCOPY  2014   clear    LEFT HEART CATH AND CORONARY ANGIOGRAPHY N/A 06/09/2019   Procedure: LEFT HEART CATH AND CORONARY ANGIOGRAPHY;  Surgeon: Dann Candyce RAMAN, MD;  Location: [facility];  Service: Cardiovascular;  Laterality: N/A;   SPINE SURGERY  1984   thoracic spine fusion    VARICOSE VEIN SURGERY Left 1975   Social History   Socioeconomic History   Marital status: Significant Other    Spouse name: Not on file   Number of children: 3   Years of education: 7   Highest education level: Not on file  Occupational History   Occupation: retired    Associate Professor: [employer]  Tobacco Use   Smoking status: Former    Current packs/day: 0.00    Types: Cigarettes    Quit date:  2007    Years since quitting: 18.6   Smokeless tobacco: Never  Vaping Use   Vaping status: Never Used  Substance and Sexual Activity   Alcohol use: No   Drug use: No   Sexual activity: Not on file  Other Topics Concern   Not on file  Social History Narrative   Lives with significant other in a 2 story home.  Has 3 children.  Retired.  Education: 7th grade.    Social Drivers of Corporate investment banker Strain: Low Risk  (03/10/2024)   Overall Financial Resource Strain (CARDIA)    Difficulty of Paying Living  Expenses: Not hard at all  Food Insecurity: No Food Insecurity (03/10/2024)   Hunger Vital Sign    Worried About Running Out of Food in the Last Year: Never true    Ran Out of Food in the Last Year: Never true  Transportation Needs: No Transportation Needs (03/10/2024)   PRAPARE - Administrator, Civil Service (Medical): No    Lack of Transportation (Non-Medical): No  Physical Activity: Sufficiently Active (03/10/2024)   Exercise Vital Sign    Days of Exercise per Week: 7 days    Minutes of Exercise per Session: 60 min  Stress: No Stress Concern Present (03/10/2024)   [employer] of Occupational Health - Occupational Stress Questionnaire    Feeling of Stress : Not at all  Social Connections: Moderately Isolated (03/10/2024)   Social Connection and Isolation Panel    Frequency of Communication with Friends and Family: More than three times a week    Frequency of Social Gatherings with Friends and Family: More than three times a week    Attends Religious Services: Never    Database administrator or Organizations: No    Attends Banker Meetings: Never    Marital Status: Married  Catering manager Violence: Not At Risk (03/10/2024)   Humiliation, Afraid, Rape, and Kick questionnaire    Fear of Current or Ex-Partner: No    Emotionally Abused: No    Physically Abused: No    Sexually Abused: No   Current Outpatient Medications on File Prior to  Visit  Medication Sig Dispense Refill   ACCU-CHEK GUIDE TEST test strip TEST BLOOD SUGAR 2 TIMES DAILY AT 12 NOON AND 4 PM. USE AS INSTRUCTED 200 strip 3   Accu-Chek Softclix Lancets lancets 100 each by Other route 2 times daily at 12 noon and 4 pm. Use as instructed 100 each 12   acetaminophen  (TYLENOL ) 325 MG tablet Take 2 tablets (650 mg total) by mouth every 6 (six) hours as needed for mild pain (or Fever >/= 101).     albuterol  (VENTOLIN  HFA) 108 (90 Base) MCG/ACT inhaler Inhale 2 puffs into the lungs every 6 (six) hours as needed for wheezing or shortness of breath. 8 g 0   Ascorbic Acid  (VITAMIN C) 100 MG tablet Take 100 mg by mouth daily.     aspirin  81 MG tablet Take 81 mg by mouth daily.     benzonatate  (TESSALON ) 100 MG capsule Take 1 capsule (100 mg total) by mouth 3 (three) times daily as needed for cough. 20 capsule 0   bisoprolol  (ZEBETA ) 5 MG tablet TAKE 1/2 TABLET EVERY MONDAY, WEDNESDAY AND FRIDAY. 20 tablet 3   Blood Glucose Monitoring Suppl (ACCU-CHEK GUIDE) w/Device KIT 1 kit by Other route 2 (two) times daily after a meal. 1 kit 0   Calcium  Citrate-Vitamin D (CALCIUM  CITRATE +D PO) Take 1 tablet by mouth daily.      cyanocobalamin  1000 MCG tablet Take 1,000 mcg by mouth daily.      DROPLET PEN NEEDLES 32G X 4 MM MISC USE ONE TIME DAILY 100 each 3   empagliflozin  (JARDIANCE ) 10 MG TABS tablet Take 1 tablet (10 mg total) by mouth daily before breakfast. (Patient not taking: Reported on 02/08/2024) 90 tablet 3   fexofenadine  (ALLEGRA ) 180 MG tablet Take 1 tablet (180 mg total) by mouth daily. 90 tablet 2   insulin  glargine (LANTUS  SOLOSTAR) 100 UNIT/ML Solostar Pen INJECT 30 UNITS INTO THE SKIN EVERY MORNING. 30 mL 2  nitroGLYCERIN  (NITROSTAT ) 0.4 MG SL tablet PLACE 1 TABLET (0.4 MG TOTAL) UNDER THE TONGUE EVERY 5 (FIVE) MINUTES AS NEEDED FOR CHEST PAIN. 25 tablet 1   repaglinide  (PRANDIN ) 0.5 MG tablet Take 1 tablet (0.5 mg total) by mouth 2 (two) times daily before a meal. 180  tablet 3   triamcinolone  cream (KENALOG ) 0.1 % Apply 1 Application topically 2 (two) times daily as needed. 30 g 1   Vitamin E 400 units TABS Take 400 Units by mouth daily.     Zinc  100 MG TABS Take 100 mg by mouth daily.     [DISCONTINUED] atorvastatin  (LIPITOR) 10 MG tablet Take 1 tablet (10 mg total) by mouth daily. (Patient not taking: Reported on 09/26/2020) 90 tablet 2   No current facility-administered medications on file prior to visit.   Allergies  Allergen Reactions   Aspirin      Takes ASA 81mg  at home.   Oxycodone Nausea Only    PERCOCET- weakness, dizziness, shaking, nausea   Penicillins Hives    Did it involve swelling of the face/tongue/throat, SOB, or low BP? No Did it involve sudden or severe rash/hives, skin peeling, or any reaction on the inside of your mouth or nose? Yes Did you need to seek medical attention at a hospital or doctor's office? Yes When did it last happen?      long time If all above answers are "NO", may proceed with cephalosporin use.    Percocet Bao.Bob ]    Ciprofloxacin  Rash   Prednisone  Palpitations   Family History  Problem Relation Age of Onset   Colon cancer Mother    Diabetes Father    Heart disease Father    Stroke Father    Hypertension Father    Hyperlipidemia Father    Heart disease Brother    Pancreatic cancer Cousin        x 2   Prostate cancer Cousin    Heart disease Son    PE: BP 120/64   Pulse 63   Ht 5' 8 (1.727 m)   Wt 169 lb (76.7 kg)   SpO2 93%   BMI 25.70 kg/m  Wt Readings from Last 3 Encounters:  06/05/24 169 lb (76.7 kg)  05/25/24 170 lb (77.1 kg)  03/10/24 173 lb 11.2 oz (78.8 kg)   Constitutional: Normal  weight, in NAD Eyes:  EOMI, no exophthalmos ENT: no neck masses, no cervical lymphadenopathy Cardiovascular: RRR, No MRG Respiratory: CTA B Musculoskeletal: no deformities Skin: no rash, + significant varicosities in left leg Neurological: no tremor with outstretched  hands  ASSESSMENT: 1. DM2, insulin -dependent, uncontrolled, without long-term complications  2. HL  PLAN:  1. Patient with longstanding, uncontrolled, type 2 diabetes, on basal insulin  at last visit, to which I repeatedly recommended an SGLT2 inhibitor, but he was reticent to start this due to fear of possible side effects.  At last visit I again recommended a low-dose Jardiance  and he agreed to start this.  I advised him to stay well-hydrated.  At that time, he was only checking sugars in the morning and they were mostly at goal, but we checked his blood sugar in the office, after eating and it was 254.  -At today's visit, he still did not start Jardiance  unfortunately.  Wife mentions that this was not affordable but they would prefer not to go to a PAP pgm.he is still only checking sugars in the morning sometimes before and sometimes after coffee and they are at or above target.  We  do not have many options for treatment for him so as long as his HbA1c remains at goal, we will continue a Lantus  only regimen.  Our goal HbA1c for him would be lower than 7.4 due to age. - I suggested to:  Patient Instructions  Please continue: - Lantus  30 units in am  Please return in 4-6 months.  - we checked his HbA1c: 7.2% (slightly higher) - advised to check sugars at different times of the day - 1x a day, rotating check times - advised for yearly eye exams >> he is UTD - return to clinic in 4-6 months  2. HL - Latest lipid panel showed fractions at goal in 03/2023: Lab Results  Component Value Date   CHOL 167 03/30/2023   HDL 45.20 03/30/2023   LDLCALC 99 03/30/2023   TRIG 118.0 03/30/2023   CHOLHDL 4 03/30/2023  -He previously was on Lipitor 10 mg daily but stopped due to increased LFTs.  Ago, latest LFTs are normal. Lab Results  Component Value Date   ALT 28 05/25/2024   AST 26 05/25/2024   ALKPHOS 120 05/25/2024   BILITOT 0.5 05/25/2024   Lela Fendt, MD PhD [facility]

## 2024-06-06 DIAGNOSIS — I872 Venous insufficiency (chronic) (peripheral): Secondary | ICD-10-CM | POA: Diagnosis not present

## 2024-06-12 DIAGNOSIS — H353111 Nonexudative age-related macular degeneration, right eye, early dry stage: Secondary | ICD-10-CM | POA: Diagnosis not present

## 2024-06-12 DIAGNOSIS — H04123 Dry eye syndrome of bilateral lacrimal glands: Secondary | ICD-10-CM | POA: Diagnosis not present

## 2024-06-12 DIAGNOSIS — H26491 Other secondary cataract, right eye: Secondary | ICD-10-CM | POA: Diagnosis not present

## 2024-06-12 DIAGNOSIS — H43813 Vitreous degeneration, bilateral: Secondary | ICD-10-CM | POA: Diagnosis not present

## 2024-06-12 DIAGNOSIS — H5203 Hypermetropia, bilateral: Secondary | ICD-10-CM | POA: Diagnosis not present

## 2024-06-12 DIAGNOSIS — H524 Presbyopia: Secondary | ICD-10-CM | POA: Diagnosis not present

## 2024-06-12 DIAGNOSIS — E119 Type 2 diabetes mellitus without complications: Secondary | ICD-10-CM | POA: Diagnosis not present

## 2024-06-12 DIAGNOSIS — H353122 Nonexudative age-related macular degeneration, left eye, intermediate dry stage: Secondary | ICD-10-CM | POA: Diagnosis not present

## 2024-06-12 DIAGNOSIS — H52203 Unspecified astigmatism, bilateral: Secondary | ICD-10-CM | POA: Diagnosis not present

## 2024-06-13 DIAGNOSIS — I87392 Chronic venous hypertension (idiopathic) with other complications of left lower extremity: Secondary | ICD-10-CM | POA: Diagnosis not present

## 2024-06-13 DIAGNOSIS — I83892 Varicose veins of left lower extremities with other complications: Secondary | ICD-10-CM | POA: Diagnosis not present

## 2024-07-11 ENCOUNTER — Telehealth: Payer: Self-pay

## 2024-07-11 DIAGNOSIS — I8002 Phlebitis and thrombophlebitis of superficial vessels of left lower extremity: Secondary | ICD-10-CM | POA: Diagnosis not present

## 2024-07-11 NOTE — Telephone Encounter (Signed)
 Copied from CRM [id number]. Topic: Clinical - Medical Advice >> Jul 11, 2024 11:02 AM Bill [name] wrote: Reason for CRM: Patient went to dentist and stated he had an abcess tooth , wanted to know what medication that he is allergic to would like a callback regarding this    [phone]

## 2024-07-11 NOTE — Telephone Encounter (Signed)
 Patient informed of allergies listed in chart. Patient stated that medication dentist prescribed were causing upset stomach and patient was advised to reach out to dentist for feedback on medication and possible alternative.

## 2024-09-04 ENCOUNTER — Other Ambulatory Visit: Payer: Self-pay | Admitting: Internal Medicine

## 2024-09-13 DIAGNOSIS — I872 Venous insufficiency (chronic) (peripheral): Secondary | ICD-10-CM | POA: Diagnosis not present

## 2024-09-13 DIAGNOSIS — I87392 Chronic venous hypertension (idiopathic) with other complications of left lower extremity: Secondary | ICD-10-CM | POA: Diagnosis not present

## 2024-09-23 ENCOUNTER — Other Ambulatory Visit: Payer: Self-pay | Admitting: Internal Medicine

## 2024-10-03 ENCOUNTER — Ambulatory Visit: Payer: Self-pay

## 2024-10-03 NOTE — Telephone Encounter (Signed)
 FYI Only or Action Required?: FYI only for provider: appointment scheduled on 10/04/24.No sooner appts available.   Patient was last seen in primary care on 02/08/2024 by Johnny Garnette LABOR, MD.  Called Nurse Triage reporting Fatigue and Night Sweats.  Symptoms began several weeks ago.  Interventions attempted: Nothing.  Symptoms are: gradually worsening.  Triage Disposition: See HCP Within 4 Hours (Or PCP Triage)  Patient/caregiver understands and will follow disposition?: Yes  Reason for Disposition  [1] MODERATE weakness (e.g., interferes with work, school, normal activities) AND [2] cause unknown  (Exceptions: Weakness from acute minor illness or poor fluid intake; weakness is chronic and not worse.)  Answer Assessment - Initial Assessment Questions Patient called in stating that he's been tiring out easily, under a lot of stress recently, and waking up with sweats. He also mentions that he had surgery on his leg a bit ago and when the doctor palpated the site it left an indentation that took a while to get back to normal. He also reports he's been having headaches and a cough. Denies chest pain and SOB. He mentions his blood sugars have been a little higher than normal for him ranging around 150, he is treating with insulin . Advised to come in for office visit today, appt scheduled for 10/04/24. No sooner appts available and patient prefers to be seen by his PCP. Advised to call 911 if he begins to have chest pain and/or SOB. Advised to call back if symptoms worsen.  1. DESCRIPTION: Describe how you are feeling.     I tire out easily, I've been under a lot of stress. When I wake up in the morning it's like I'm sick and I broke a fever and I sweat everywhere, the pillow is wet and the sheets are wet.  2. SEVERITY: How bad is it?  Can you stand and walk?     Moderate-able to move around and do things, but tires out easily    3. ONSET: When did these symptoms begin? (e.g., hours,  days, weeks, months)     I'm not really sure when it started  4. CAUSE: What do you think is causing the weakness or fatigue? (e.g., not drinking enough fluids, medical problem, trouble sleeping)     I'm worried it might be something with my heart.  5. NEW MEDICINES:  Have you started on any new medicines recently? (e.g., opioid pain medicines, benzodiazepines, muscle relaxants, antidepressants, antihistamines, neuroleptics, beta blockers)     No  6. OTHER SYMPTOMS: Do you have any other symptoms? (e.g., chest pain, fever, cough, SOB, vomiting, diarrhea, bleeding, other areas of pain)   Has had cough; Denies chest pain, SOB, nausea/vomiting, bleeding  7. PREGNANCY: Is there any chance you are pregnant? When was your last menstrual period?     NA  Protocols used: Weakness (Generalized) and Fatigue-A-AH  Copied from CRM #[id number]. Topic: Clinical - Red Word Triage >> Oct 03, 2024  9:31 AM Roselie BROCKS wrote: [facility] that prompted transfer to Nurse Triage: Patient states he is having sudden weakness,chest pains,congestion, and waking up very sweaty.

## 2024-10-03 NOTE — Telephone Encounter (Signed)
 Upon transfer from [facility]- could not hear patient. Attempted to CB to discuss concerns. Left VM to call back  Copied from CRM #[id number]. Topic: Clinical - Red Word Triage >> Oct 03, 2024  9:25 AM Bill [name] wrote: Red Word that prompted transfer to Nurse Triage:  Dont feel quite right.   Wake up in the mornings and soaking wet with sweat, wondering if its heart. had surgery on left leg and been heppning since then. some headaches.  No signs of fever or any signs of being sick to be sweating.

## 2024-10-03 NOTE — Telephone Encounter (Signed)
 Triage completed earlier today, patient called back in around 9:36AM.

## 2024-10-04 ENCOUNTER — Ambulatory Visit: Admitting: Family Medicine

## 2024-10-04 ENCOUNTER — Encounter: Payer: Self-pay | Admitting: Family Medicine

## 2024-10-04 ENCOUNTER — Ambulatory Visit

## 2024-10-04 VITALS — BP 130/76 | HR 76 | Temp 97.8°F | Ht 68.0 in | Wt 168.0 lb

## 2024-10-04 DIAGNOSIS — R059 Cough, unspecified: Secondary | ICD-10-CM | POA: Diagnosis not present

## 2024-10-04 DIAGNOSIS — R739 Hyperglycemia, unspecified: Secondary | ICD-10-CM | POA: Diagnosis not present

## 2024-10-04 DIAGNOSIS — R61 Generalized hyperhidrosis: Secondary | ICD-10-CM | POA: Diagnosis not present

## 2024-10-04 NOTE — Progress Notes (Signed)
° °  Subjective:    Patient ID: Bill [name], male    DOB: 1941-02-05, 83 y.o.   MRN: [id number]  HPI Here for 4 months of intermittent dry coughing, SOB in exertion, and night sweats. No detectable fever. No ST. No change in BM's or urinations. His appetite is good,  no recent weight changes.    Review of Systems  Constitutional:  Positive for diaphoresis and fatigue. Negative for chills, fever and unexpected weight change.  HENT: Negative.    Eyes: Negative.   Respiratory:  Positive for cough and shortness of breath. Negative for wheezing.   Gastrointestinal: Negative.   Genitourinary: Negative.        Objective:   Physical Exam Constitutional:      Appearance: Normal appearance.  HENT:     Right Ear: Tympanic membrane, ear canal and external ear normal.     Left Ear: Tympanic membrane, ear canal and external ear normal.     Nose: Nose normal.     Mouth/Throat:     Pharynx: Oropharynx is clear.  Eyes:     Conjunctiva/sclera: Conjunctivae normal.  Cardiovascular:     Rate and Rhythm: Normal rate and regular rhythm.     Pulses: Normal pulses.     Heart sounds: Normal heart sounds.  Pulmonary:     Effort: Pulmonary effort is normal.     Breath sounds: Normal breath sounds.  Abdominal:     General: Abdomen is flat. Bowel sounds are normal. There is no distension.     Palpations: Abdomen is soft. There is no mass.     Tenderness: There is no abdominal tenderness. There is no right CVA tenderness, left CVA tenderness, guarding or rebound.     Hernia: No hernia is present.  Lymphadenopathy:     Cervical: No cervical adenopathy.  Neurological:     Mental Status: He is alert.           Assessment & Plan:  Night sweats, cough, and SOB of unclear etiology. We will send him for labs and a CXR today.  Garnette Olmsted, MD

## 2024-10-04 NOTE — Telephone Encounter (Signed)
Patient seen today for this concern.

## 2024-10-05 ENCOUNTER — Telehealth: Payer: Self-pay | Admitting: Family Medicine

## 2024-10-05 ENCOUNTER — Ambulatory Visit: Payer: Self-pay | Admitting: Family Medicine

## 2024-10-05 LAB — HEPATIC FUNCTION PANEL
ALT: 29 U/L (ref 0–53)
AST: 26 U/L (ref 0–37)
Albumin: 4.1 g/dL (ref 3.5–5.2)
Alkaline Phosphatase: 119 U/L — ABNORMAL HIGH (ref 39–117)
Bilirubin, Direct: 0.1 mg/dL (ref 0.0–0.3)
Total Bilirubin: 0.6 mg/dL (ref 0.2–1.2)
Total Protein: 6.6 g/dL (ref 6.0–8.3)

## 2024-10-05 LAB — TSH: TSH: 2.47 u[IU]/mL (ref 0.35–5.50)

## 2024-10-05 LAB — BASIC METABOLIC PANEL WITH GFR
BUN: 14 mg/dL (ref 6–23)
CO2: 27 meq/L (ref 19–32)
Calcium: 9.1 mg/dL (ref 8.4–10.5)
Chloride: 104 meq/L (ref 96–112)
Creatinine, Ser: 1 mg/dL (ref 0.40–1.50)
GFR: 70.31 mL/min (ref >60.00)
Glucose, Bld: 201 mg/dL — ABNORMAL HIGH (ref 70–99)
Potassium: 4 meq/L (ref 3.5–5.1)
Sodium: 140 meq/L (ref 135–145)

## 2024-10-05 LAB — CBC WITH DIFFERENTIAL/PLATELET
Basophils Absolute: 0.3 K/uL — ABNORMAL HIGH (ref 0.0–0.1)
Basophils Relative: 1.7 % (ref 0.0–3.0)
Eosinophils Absolute: 0.7 K/uL (ref 0.0–0.7)
Eosinophils Relative: 3.6 % (ref 0.0–5.0)
HCT: 49.6 % (ref 39.0–52.0)
Hemoglobin: 16.5 g/dL (ref 13.0–17.0)
Lymphocytes Relative: 13.7 % (ref 12.0–46.0)
Lymphs Abs: 2.8 K/uL (ref 0.7–4.0)
MCHC: 33.2 g/dL (ref 30.0–36.0)
MCV: 93.9 fl (ref 78.0–100.0)
Monocytes Absolute: 0.7 K/uL (ref 0.1–1.0)
Monocytes Relative: 3.2 % (ref 3.0–12.0)
Neutro Abs: 16 K/uL — ABNORMAL HIGH (ref 1.4–7.7)
Neutrophils Relative %: 77.8 % — ABNORMAL HIGH (ref 43.0–77.0)
Platelets: 254 K/uL (ref 150.0–400.0)
RBC: 5.28 Mil/uL (ref 4.22–5.81)
RDW: 18.5 % — ABNORMAL HIGH (ref 11.5–15.5)
WBC: 20.5 K/uL (ref 4.0–10.5)

## 2024-10-05 LAB — URINALYSIS
Bilirubin Urine: NEGATIVE
Hgb urine dipstick: NEGATIVE
Ketones, ur: NEGATIVE
Leukocytes,Ua: NEGATIVE
Nitrite: NEGATIVE
Specific Gravity, Urine: >=1.03 — AB (ref 1.000–1.030)
Total Protein, Urine: NEGATIVE
Urine Glucose: NEGATIVE
Urobilinogen, UA: 0.2 (ref 0.0–1.0)
pH: 6 (ref 5.0–8.0)

## 2024-10-05 LAB — PSA: PSA: 0.15 ng/mL (ref 0.10–4.00)

## 2024-10-05 LAB — HEMOGLOBIN A1C: Hgb A1c MFr Bld: 6.9 % — ABNORMAL HIGH (ref 4.6–6.5)

## 2024-10-05 NOTE — Telephone Encounter (Signed)
 Returned call and left voicemail to call office back. I informed patient that I will forward Dr. Johnny result not with him through MyChart.

## 2024-10-05 NOTE — Telephone Encounter (Signed)
 Patient called E2C2 returning Anthony's call, please call back at [phone]

## 2024-10-06 ENCOUNTER — Telehealth: Payer: Self-pay | Admitting: *Deleted

## 2024-10-06 NOTE — Telephone Encounter (Signed)
 Received call from pt's fiance', Pat. He states that pt's PCP noted pt's WBC going up and wanted to schedu;e appt with Dr. Federico. Scheduling message sent

## 2024-10-09 ENCOUNTER — Telehealth: Payer: Self-pay

## 2024-10-09 NOTE — Telephone Encounter (Signed)
 Returned call to pt who had reported his PCP wanted Dr Federico to know about his elevated WBC.  Reminded pt he has an appt 10/23/24 for labs and clinic visit. Confirmed time of appt with Bruna ,per pt request. Reviewed his lab results from last appts which reflected a similar range of results. Advised that Dr Federico has been made aware.  Advised that an earlier appt is not needed per Dr Federico. Pt and Pat verbalized understanding.

## 2024-10-09 NOTE — Telephone Encounter (Signed)
 Pt's son called and left a message that his dad's PCP advised he needs to see Dr Federico as his WBCs are elevated. Labs from 10/04/24 indicate WBC 20.5. Next appt with Dr Federico 10/23/24.  Please advise.

## 2024-10-10 ENCOUNTER — Telehealth: Payer: Self-pay | Admitting: *Deleted

## 2024-10-10 NOTE — Telephone Encounter (Signed)
 Copied from CRM #[id number]. Topic: Clinical - Lab/Test Results >> Oct 09, 2024  1:11 PM Franky GRADE wrote: Reason for CRM: Patient is calling regarding an elevated white blood cell count. He was advised to schedule an appointment with the Oncologist and he is scheduled for December 29th; however, patient is concerned due to the reading and wanted to know if there is anything we can do to get him a sooner appointment. He would also like to know how elevated is the WBC.

## 2024-10-10 NOTE — Telephone Encounter (Signed)
Spoke to the patient and reviewed lab results.

## 2024-10-10 NOTE — Telephone Encounter (Signed)
 Copied from CRM #[id number]. Topic: Clinical - Lab/Test Results >> Oct 06, 2024  3:17 PM Larissa S wrote: Reason for CRM: Relayed lab results per provider's note. Patient verbalized understanding, no additional questions, and states he will schedule an appointment with Dr. Federico.

## 2024-10-11 NOTE — Telephone Encounter (Signed)
 noted

## 2024-10-22 ENCOUNTER — Other Ambulatory Visit: Payer: Self-pay | Admitting: Hematology and Oncology

## 2024-10-22 DIAGNOSIS — D471 Chronic myeloproliferative disease: Secondary | ICD-10-CM

## 2024-10-22 NOTE — Progress Notes (Unsigned)
 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 Federico. WBC 16.4, Hgb 15.6, MCV 96.7, Plt 294. MPN panel returns JAK2 positive 6) 11/13/2020: WBC 16.0, Hgb 13.5, MCV 94.4, Plt 392 6) 05/22/2022: WBC 15.3, Hgb 15.4, MCV 91.2, Plt 301  Interval History:  Bill [name] 83 y.o. male with medical history significant for JAK2 positive leukocytosis who presents for a follow up visit. The patient's last visit was on 05/25/2024. In the interim since the last visit he has no major changes in his health.  On exam today Bill [name] is accompanied by his wife.  He reports he has been well overall with interim since our last visit.  He reports he is having a little bit more issues with joints such as his wrists, knuckles, knees, feet and occasionally his hips.  He reports that he is unsure why this is occurring.  He reports he had a cousin with the same issue and was given a medicine by his doctor that helped.  He reports that he has been having some issues with cough and he did have a cold last week but is not currently having any runny nose, sore throat, cough.  He denies any fevers, chills, sweats.  He reports that he did recently have a tooth infection for which a tooth was extracted on the upper left jaw.  He reports that he does not have any overt signs of bleeding, bruising, or dark stools.  Full 10 point ROS is otherwise negative.  MEDICAL HISTORY:  Past Medical History:  Diagnosis Date    Abnormal nuclear stress test 06/02/2019   Acute respiratory failure due to COVID-19 [facility]) 10/17/2020   Anemia    Angina pectoris 06/02/2019   Formatting of this note might be different from the original. 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 Abnormal EGD 8/17   Headache 01/06/2021   Helicobacter pylori gastritis 07/21/2016   Medicare annual wellness visit, initial 01/05/2017   Formatting of this note might be different from the original. 3/18   Mixed dyslipidemia 01/12/2020   Neck pain 02/16/2020   Other chronic pain 09/02/2020   Penicillin allergy 07/21/2016   Pneumonia    Pneumonia due to COVID-19 virus 10/17/2020   Pulmonary nodule 02/28/2014   Formatting of this note might be different from the original. RML, no change   Right groin pain 06/16/2019   Skin cancer    Trigeminal neuralgia 07/26/2020  Type 2 diabetes mellitus without complication, without long-term current use of insulin  (HCC) 04/11/2021    SURGICAL HISTORY: Past Surgical History:  Procedure Laterality Date   APPENDECTOMY     back fusion     cataract surgery Left    CHOLECYSTECTOMY     COLONOSCOPY  2014   clear    LEFT HEART CATH AND CORONARY ANGIOGRAPHY N/A 06/09/2019   Procedure: LEFT HEART CATH AND CORONARY ANGIOGRAPHY;  Surgeon: Dann Candyce RAMAN, MD;  Location: [facility];  Service:  Cardiovascular;  Laterality: N/A;   SPINE SURGERY  1984   thoracic spine fusion    VARICOSE VEIN SURGERY Left 1975    SOCIAL HISTORY: Social History   Socioeconomic History   Marital status: Significant Other    Spouse name: Not on file   Number of children: 3   Years of education: 7   Highest education level: Not on file  Occupational History   Occupation: retired    Associate Professor: [employer]  Tobacco Use   Smoking status: Former    Current packs/day: 0.00    Types: Cigarettes    Quit date: 2007    Years since quitting: 19.0   Smokeless tobacco: Never  Vaping Use   Vaping status: Never Used  Substance and Sexual Activity   Alcohol use: No   Drug use: No   Sexual activity: Not on file  Other Topics Concern   Not on file  Social History Narrative   Lives with significant other in a 2 story home.  Has 3 children.  Retired.  Education: 7th grade.    Social Drivers of Health   Tobacco Use: Medium Risk (10/04/2024)   Patient History    Smoking Tobacco Use: Former    Smokeless Tobacco Use: Never    Passive Exposure: Not on file  Financial Resource Strain: Low Risk (03/10/2024)   Overall Financial Resource Strain (CARDIA)    Difficulty of Paying Living Expenses: Not hard at all  Food Insecurity: No Food Insecurity (03/10/2024)   Hunger Vital Sign    Worried About Running Out of Food in the Last Year: Never true    Ran Out of Food in the Last Year: Never true  Transportation Needs: No Transportation Needs (03/10/2024)   PRAPARE - Administrator, Civil Service (Medical): No    Lack of Transportation (Non-Medical): No  Physical Activity: Sufficiently Active (03/10/2024)   Exercise Vital Sign    Days of Exercise per Week: 7 days    Minutes of Exercise per Session: 60 min  Stress: No Stress Concern Present (03/10/2024)   [employer] of Occupational Health - Occupational Stress Questionnaire    Feeling of Stress : Not at all  Social Connections:  Moderately Isolated (03/10/2024)   Social Connection and Isolation Panel    Frequency of Communication with Friends and Family: More than three times a week    Frequency of Social Gatherings with Friends and Family: More than three times a week    Attends Religious Services: Never    Database Administrator or Organizations: No    Attends Banker Meetings: Never    Marital Status: Married  Catering Manager Violence: Not At Risk (03/10/2024)   Humiliation, Afraid, Rape, and Kick questionnaire    Fear of Current or Ex-Partner: No    Emotionally Abused: No    Physically Abused: No    Sexually Abused: No  Depression (PHQ2-9): Low Risk (03/10/2024)   Depression (PHQ2-9)  PHQ-2 Score: 0  Alcohol Screen: Low Risk (03/10/2024)   Alcohol Screen    Last Alcohol Screening Score (AUDIT): 2  Housing: Unknown (03/10/2024)   Housing Stability Vital Sign    Unable to Pay for Housing in the Last Year: No    Number of Times Moved in the Last Year: Not on file    Homeless in the Last Year: No  Utilities: Not At Risk (03/10/2024)   AHC Utilities    Threatened with loss of utilities: No  Health Literacy: Adequate Health Literacy (03/10/2024)   B1300 Health Literacy    Frequency of need for help with medical instructions: Never    FAMILY HISTORY: Family History  Problem Relation Age of Onset   Colon cancer Mother    Diabetes Father    Heart disease Father    Stroke Father    Hypertension Father    Hyperlipidemia Father    Heart disease Brother    Pancreatic cancer Cousin        x 2   Prostate cancer Cousin    Heart disease Son     ALLERGIES:  is allergic to aspirin , oxycodone, penicillins, percocet [oxycodone-acetaminophen ], ciprofloxacin , and prednisone .  MEDICATIONS:  Current Outpatient Medications  Medication Sig Dispense Refill   ACCU-CHEK GUIDE TEST test strip TEST BLOOD SUGAR 2 TIMES DAILY AT 12 NOON AND 4 PM. USE AS INSTRUCTED 200 strip 3   Accu-Chek Softclix Lancets  lancets 100 each by Other route 2 times daily at 12 noon and 4 pm. Use as instructed 100 each 12   acetaminophen  (TYLENOL ) 325 MG tablet Take 2 tablets (650 mg total) by mouth every 6 (six) hours as needed for mild pain (or Fever >/= 101).     albuterol  (VENTOLIN  HFA) 108 (90 Base) MCG/ACT inhaler Inhale 2 puffs into the lungs every 6 (six) hours as needed for wheezing or shortness of breath. 8 g 0   Ascorbic Acid  (VITAMIN C) 100 MG tablet Take 100 mg by mouth daily.     aspirin  81 MG tablet Take 81 mg by mouth daily.     bisoprolol  (ZEBETA ) 5 MG tablet TAKE 1/2 TABLET EVERY MONDAY, WEDNESDAY AND FRIDAY. 20 tablet 3   Blood Glucose Monitoring Suppl (ACCU-CHEK GUIDE) w/Device KIT 1 kit by Other route 2 (two) times daily after a meal. 1 kit 0   Calcium  Citrate-Vitamin D (CALCIUM  CITRATE +D PO) Take 1 tablet by mouth daily.      cyanocobalamin  1000 MCG tablet Take 1,000 mcg by mouth daily.      DROPLET PEN NEEDLES 32G X 4 MM MISC USE ONE TIME DAILY 100 each 3   empagliflozin  (JARDIANCE ) 10 MG TABS tablet Take 1 tablet (10 mg total) by mouth daily before breakfast. (Patient not taking: Reported on 06/05/2024) 90 tablet 3   fexofenadine  (ALLEGRA ) 180 MG tablet Take 1 tablet (180 mg total) by mouth daily. 90 tablet 2   LANTUS  SOLOSTAR 100 UNIT/ML Solostar Pen INJECT 30 UNITS INTO THE SKIN EVERY MORNING. 30 mL 3   nitroGLYCERIN  (NITROSTAT ) 0.4 MG SL tablet PLACE 1 TABLET (0.4 MG TOTAL) UNDER THE TONGUE EVERY 5 (FIVE) MINUTES AS NEEDED FOR CHEST PAIN. 25 tablet 1   triamcinolone  cream (KENALOG ) 0.1 % Apply 1 Application topically 2 (two) times daily as needed. 30 g 1   Vitamin E 400 units TABS Take 400 Units by mouth daily.     Zinc  100 MG TABS Take 100 mg by mouth daily.     No current  facility-administered medications for this visit.    REVIEW OF SYSTEMS:   Constitutional: ( - ) fevers, ( - )  chills , ( - ) night sweats Eyes: ( - ) blurriness of vision, ( - ) double vision, ( - ) watery eyes Ears,  nose, mouth, throat, and face: ( - ) mucositis, ( - ) sore throat Respiratory: ( - ) cough, ( - ) dyspnea, ( - ) wheezes Cardiovascular: ( - ) palpitation, ( - ) chest discomfort, ( - ) lower extremity swelling Gastrointestinal:  ( - ) nausea, ( - ) heartburn, ( - ) change in bowel habits Skin: ( - ) abnormal skin rashes Lymphatics: ( - ) new lymphadenopathy, ( - ) easy bruising Neurological: ( - ) numbness, ( - ) tingling, ( - ) new weaknesses Behavioral/Psych: ( - ) mood change, ( - ) new changes  All other systems were reviewed with the patient and are negative.  PHYSICAL EXAMINATION: ECOG PERFORMANCE STATUS: 0 - Asymptomatic  There were no vitals filed for this visit.     There were no vitals filed for this visit.      GENERAL: well appearing elderly Caucasian male. alert, no distress and comfortable SKIN: skin color, texture, turgor are normal, no rashes or significant lesions EYES: conjunctiva are pink and non-injected, sclera clear LUNGS: clear to auscultation and percussion with normal breathing effort HEART: regular rate & rhythm and no murmurs and no lower extremity edema Musculoskeletal: no cyanosis of digits and no clubbing  PSYCH: alert & oriented x 3, fluent speech NEURO: no focal motor/sensory deficits  LABORATORY DATA:  I have reviewed the data as listed    Latest Ref Rng & Units 10/04/2024    3:17 PM 05/25/2024    2:43 PM 05/26/2023    2:38 PM  CBC  WBC 4.0 - 10.5 K/uL 20.5 Repeated and verified X2.  20.1  15.3   Hemoglobin 13.0 - 17.0 g/dL 83.4  85.2  83.7   Hematocrit 39.0 - 52.0 % 49.6  42.4  48.3   Platelets 150.0 - 400.0 K/uL 254.0  298  270        Latest Ref Rng & Units 10/04/2024    3:17 PM 05/25/2024    2:43 PM 05/26/2023    2:38 PM  CMP  Glucose 70 - 99 mg/dL 798  745  751   BUN 6 - 23 mg/dL 14  9  9    Creatinine 0.40 - 1.50 mg/dL 8.99  9.20  9.13   Sodium 135 - 145 mEq/L 140  137  139   Potassium 3.5 - 5.1 mEq/L 4.0  4.0  4.2   Chloride  96 - 112 mEq/L 104  104  107   CO2 19 - 32 mEq/L 27  28  27    Calcium  8.4 - 10.5 mg/dL 9.1  8.8  9.1   Total Protein 6.0 - 8.3 g/dL 6.6  6.8  6.4   Total Bilirubin 0.2 - 1.2 mg/dL 0.6  0.5  0.6   Alkaline Phos 39 - 117 U/L 119  120  95   AST 0 - 37 U/L 26  26  27    ALT 0 - 53 U/L 29  28  32     RADIOGRAPHIC STUDIES: DG Chest 2 View Result Date: 10/10/2024 EXAM: 2 VIEW(S) XRAY OF THE CHEST 10/04/2024 03:27:07 PM COMPARISON: 08/15/2021. CLINICAL HISTORY: coughing with night sweats FINDINGS: LUNGS AND PLEURA: Diffuse coarse interstitial prominence, stable, possibly representing chronic interstitial lung  disease. No pleural effusion. No pneumothorax. HEART AND MEDIASTINUM: Calcified aorta. No acute abnormality of the cardiac and mediastinal silhouettes. BONES AND SOFT TISSUES: Thoracic mild dextroscoliosis and kyphosis. Thoracic degenerative changes. No acute osseous abnormality. IMPRESSION: 1. Diffuse interstitial prominence, stable, possibly representing chronic interstitial lung disease. Electronically signed by: Dayne Hassell MD 10/10/2024 03:31 PM EST RP Workstation: [id number]     ASSESSMENT & PLAN Bill [name] 83 y.o. male with medical history significant for JAK2 positive leukocytosis who presents for a follow up visit.  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  The exact myeloproliferative neoplasm is not clear at this time as the patient only has an elevation of white blood cell count with no elevation in his platelets or red blood cells.  Given this the patient does not require cytoreductive therapy and can be managed on baby aspirin  alone, which he is already taking for his cardiac disease.  Technically in order to confirm the diagnosis of an MPN a bone marrow biopsy is required.  In this case it would confirm the diagnosis, however it would not  change our management.  As his counts are currently within normal limits he would not require hydroxyurea or other treatments in order to lower the counts.  As such the bone marrow biopsy will be predominantly academic.  Myelofibrosis is also a consideration, however given that there is no disease altering treatment we could wait until there was a drop in his blood counts before considering the bone marrow biopsy.  Overall I think we can continue with observation in order to assure that the patient does not convert into a leukemia or a myelofibrosis with cytopenias.  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 He has an MPN, but the exact one is not clear based off the peripheral blood finds.  --a bone marrow biopsy can be done to confirm a diagnosis, but with no elevated RBC or Plt counts we would not start cytoreductive therapy. The utility the bone marrow is for diagnosis only, it would not change the course of treatment --myelofibrosis is certainly on the differential, but as there is no treatment that alters the course of the disease it would be reasonable to hold until the patient began developing cytopenias before considering bone marrow biopsy.  Additionally has had remarkably stable counts for over a decade. PLAN:  --continue ASA 81mg  PO daily (patient takes for cardiac disease) -- Labs today show white blood cell count 18.3, Hgb 16.7, MCV 90.5, Plt 249  --RTC in 6 months for continued monitoring   # Blood in Urine  -- Patient notes he was referred to urology and was started on a medication for this but the symptoms have not resolved. --Hemoglobin within normal limits today. -- Defer management to patient's urologist.   No orders of the defined types were placed in this encounter.   All questions were answered. The patient knows to call the clinic with any problems, questions or concerns.  A total of more  than 25 minutes were spent on this encounter and over half of that time was spent on counseling and coordination of care as outlined above.   Bill IVAR Kidney, MD Department of Hematology/Oncology [facility] at [facility] Phone: [phone] Pager: [phone] Email: [email or web address]  10/22/2024 2:28 PM "

## 2024-10-23 ENCOUNTER — Inpatient Hospital Stay: Attending: Hematology and Oncology

## 2024-10-23 ENCOUNTER — Inpatient Hospital Stay: Admitting: Hematology and Oncology

## 2024-10-23 VITALS — BP 150/68 | HR 60 | Temp 98.0°F | Resp 17 | Wt 168.1 lb

## 2024-10-23 DIAGNOSIS — R319 Hematuria, unspecified: Secondary | ICD-10-CM | POA: Diagnosis not present

## 2024-10-23 DIAGNOSIS — D72829 Elevated white blood cell count, unspecified: Secondary | ICD-10-CM | POA: Insufficient documentation

## 2024-10-23 DIAGNOSIS — D72825 Bandemia: Secondary | ICD-10-CM

## 2024-10-23 DIAGNOSIS — Z800 Family history of malignant neoplasm of digestive organs: Secondary | ICD-10-CM | POA: Insufficient documentation

## 2024-10-23 DIAGNOSIS — Z7982 Long term (current) use of aspirin: Secondary | ICD-10-CM | POA: Diagnosis not present

## 2024-10-23 DIAGNOSIS — Z8616 Personal history of COVID-19: Secondary | ICD-10-CM | POA: Diagnosis not present

## 2024-10-23 DIAGNOSIS — I519 Heart disease, unspecified: Secondary | ICD-10-CM | POA: Diagnosis not present

## 2024-10-23 DIAGNOSIS — D471 Chronic myeloproliferative disease: Secondary | ICD-10-CM

## 2024-10-23 DIAGNOSIS — Z8041 Family history of malignant neoplasm of ovary: Secondary | ICD-10-CM | POA: Insufficient documentation

## 2024-10-23 DIAGNOSIS — Z87891 Personal history of nicotine dependence: Secondary | ICD-10-CM | POA: Diagnosis not present

## 2024-10-23 LAB — CBC WITH DIFFERENTIAL (CANCER CENTER ONLY)
Abs Immature Granulocytes: 0.18 K/uL — ABNORMAL HIGH (ref 0.00–0.07)
Basophils Absolute: 0.4 K/uL — ABNORMAL HIGH (ref 0.0–0.1)
Basophils Relative: 2 %
Eosinophils Absolute: 0.7 K/uL — ABNORMAL HIGH (ref 0.0–0.5)
Eosinophils Relative: 4 %
HCT: 49.3 % (ref 39.0–52.0)
Hemoglobin: 16.7 g/dL (ref 13.0–17.0)
Immature Granulocytes: 1 %
Lymphocytes Relative: 15 %
Lymphs Abs: 2.7 K/uL (ref 0.7–4.0)
MCH: 30.6 pg (ref 26.0–34.0)
MCHC: 33.9 g/dL (ref 30.0–36.0)
MCV: 90.5 fL (ref 80.0–100.0)
Monocytes Absolute: 0.7 K/uL (ref 0.1–1.0)
Monocytes Relative: 4 %
Neutro Abs: 13.6 K/uL — ABNORMAL HIGH (ref 1.7–7.7)
Neutrophils Relative %: 74 %
Platelet Count: 249 K/uL (ref 150–400)
RBC: 5.45 MIL/uL (ref 4.22–5.81)
RDW: 17.3 % — ABNORMAL HIGH (ref 11.5–15.5)
WBC Count: 18.3 K/uL — ABNORMAL HIGH (ref 4.0–10.5)
nRBC: 0.1 % (ref 0.0–0.2)

## 2024-10-23 LAB — CMP (CANCER CENTER ONLY)
ALT: 36 U/L (ref 0–44)
AST: 33 U/L (ref 15–41)
Albumin: 4 g/dL (ref 3.5–5.0)
Alkaline Phosphatase: 133 U/L — ABNORMAL HIGH (ref 38–126)
Anion gap: 9 (ref 5–15)
BUN: 10 mg/dL (ref 8–23)
CO2: 25 mmol/L (ref 22–32)
Calcium: 9 mg/dL (ref 8.9–10.3)
Chloride: 105 mmol/L (ref 98–111)
Creatinine: 0.83 mg/dL (ref 0.61–1.24)
GFR, Estimated: >60 mL/min
Glucose, Bld: 146 mg/dL — ABNORMAL HIGH (ref 70–99)
Potassium: 4.1 mmol/L (ref 3.5–5.1)
Sodium: 139 mmol/L (ref 135–145)
Total Bilirubin: 0.6 mg/dL (ref 0.0–1.2)
Total Protein: 6.9 g/dL (ref 6.5–8.1)

## 2024-11-13 ENCOUNTER — Ambulatory Visit: Payer: Self-pay

## 2024-11-13 NOTE — Telephone Encounter (Signed)
 FYI Only or Action Required?: FYI only for provider: appointment scheduled on 1/20.  Patient was last seen in primary care on 10/04/2024 by Johnny Garnette LABOR, MD.  Called Nurse Triage reporting Wrist Injury.  Symptoms began several days ago.  Interventions attempted: OTC medications: tylenol .  Symptoms are: gradually worsening.  Triage Disposition: See Physician Within 24 Hours  Patient/caregiver understands and will follow disposition?: Yes   Reason for Triage: Patient states he fell face forward and caught his self with his hands, now his left wrist and thumb is in extreme pain.   Reason for Disposition  [1] MODERATE pain (e.g., interferes with normal activities) AND [2] high-risk adult (e.g., age > 60 years, osteoporosis, chronic steroid use)  Answer Assessment - Initial Assessment Questions 1. MECHANISM: How did the injury happen?     Clemens catching himself with his hands 2. ONSET: When did the injury happen? (e.g., minutes or hours ago)      Few days ago 3. APPEARANCE of INJURY: What does the injury look like?      Mild bruising 4. SEVERITY: Can you use your wrist normally? Can you move your wrist back and forth? Can you hold something in your hand?     Yes but painful 5. SIZE: For cuts, bruises, or swelling, ask: How large is it? (e.g., inches or centimeters; entire wrist)       6. PAIN: How bad is the pain? (Scale 0-10; or none, mild, moderate, severe)     moderate 7. TETANUS: For any breaks in the skin, ask: When was your last tetanus booster?     no 8. OTHER SYMPTOMS: Do you have any other symptoms?      Denies numbness in fingers  Protocols used: Wrist Injury-A-AH

## 2024-11-14 ENCOUNTER — Encounter: Payer: Self-pay | Admitting: Family Medicine

## 2024-11-14 ENCOUNTER — Ambulatory Visit

## 2024-11-14 ENCOUNTER — Ambulatory Visit (INDEPENDENT_AMBULATORY_CARE_PROVIDER_SITE_OTHER): Admitting: Family Medicine

## 2024-11-14 VITALS — BP 128/68 | HR 64 | Temp 98.9°F | Wt 168.0 lb

## 2024-11-14 DIAGNOSIS — S63502A Unspecified sprain of left wrist, initial encounter: Secondary | ICD-10-CM | POA: Diagnosis not present

## 2024-11-14 NOTE — Telephone Encounter (Signed)
 Noted.

## 2024-11-14 NOTE — Progress Notes (Signed)
" ° °  Subjective:    Patient ID: Bill [name], male    DOB: April 18, 1941, 84 y.o.   MRN: [id number]  HPI Here to check his left wrist after he fell at home one week ago. He tripped over something and fell forward, extending both arms out to catch himself. The wrsit was quite painful at first, but it feels much better now. It never swelled much. He wore a brace for the first 5 days.    Review of Systems  Constitutional: Negative.   Respiratory: Negative.    Cardiovascular: Negative.   Musculoskeletal:  Positive for arthralgias.       Objective:   Physical Exam Constitutional:      General: He is not in acute distress.    Appearance: Normal appearance.  Cardiovascular:     Rate and Rhythm: Normal rate and regular rhythm.     Pulses: Normal pulses.     Heart sounds: Normal heart sounds.  Pulmonary:     Effort: Pulmonary effort is normal.     Breath sounds: Normal breath sounds.  Musculoskeletal:     Comments: Left wrist appears normal. He is mildly tender over the dorsum. ROM is full. No crepitus   Neurological:     Mental Status: He is alert.           Assessment & Plan:  Wrist sprain. We will get Xrays of this today. He can continue to wear the brace as needed. This should heal over the next few weeks.  Garnette Olmsted, MD   "

## 2024-11-21 ENCOUNTER — Ambulatory Visit: Payer: Self-pay | Admitting: Family Medicine

## 2024-12-07 ENCOUNTER — Ambulatory Visit: Admitting: Internal Medicine

## 2025-03-16 ENCOUNTER — Ambulatory Visit

## 2025-04-23 ENCOUNTER — Inpatient Hospital Stay: Attending: Hematology and Oncology

## 2025-04-23 ENCOUNTER — Inpatient Hospital Stay: Admitting: Hematology and Oncology

## 2025-05-30 ENCOUNTER — Ambulatory Visit: Admitting: Hematology and Oncology

## 2025-05-30 ENCOUNTER — Other Ambulatory Visit
